# Patient Record
Sex: Female | Born: 1935
Health system: Southern US, Community
[De-identification: ages and names within clinical notes are randomized; demographics above are authoritative.]

## PROBLEM LIST (undated history)

## (undated) DIAGNOSIS — K219 Gastro-esophageal reflux disease without esophagitis: Secondary | ICD-10-CM

## (undated) DIAGNOSIS — C801 Malignant (primary) neoplasm, unspecified: Secondary | ICD-10-CM

## (undated) DIAGNOSIS — M1990 Unspecified osteoarthritis, unspecified site: Secondary | ICD-10-CM

## (undated) DIAGNOSIS — I639 Cerebral infarction, unspecified: Secondary | ICD-10-CM

## (undated) DIAGNOSIS — R0600 Dyspnea, unspecified: Secondary | ICD-10-CM

## (undated) DIAGNOSIS — I10 Essential (primary) hypertension: Secondary | ICD-10-CM

## (undated) HISTORY — PX: SHOULDER SURGERY: SHX246

---

## 2001-10-20 ENCOUNTER — Encounter: Payer: Self-pay | Admitting: Rheumatology

## 2001-10-20 ENCOUNTER — Ambulatory Visit (HOSPITAL_COMMUNITY): Admission: RE | Admit: 2001-10-20 | Discharge: 2001-10-20 | Payer: Self-pay | Admitting: Rheumatology

## 2004-06-21 ENCOUNTER — Encounter: Admission: RE | Admit: 2004-06-21 | Discharge: 2004-06-21 | Payer: Self-pay | Admitting: Orthopedic Surgery

## 2004-06-23 ENCOUNTER — Ambulatory Visit (HOSPITAL_BASED_OUTPATIENT_CLINIC_OR_DEPARTMENT_OTHER): Admission: RE | Admit: 2004-06-23 | Discharge: 2004-06-23 | Payer: Self-pay | Admitting: Orthopedic Surgery

## 2004-06-23 ENCOUNTER — Ambulatory Visit (HOSPITAL_COMMUNITY): Admission: RE | Admit: 2004-06-23 | Discharge: 2004-06-23 | Payer: Self-pay | Admitting: Orthopedic Surgery

## 2005-06-01 ENCOUNTER — Ambulatory Visit: Payer: Self-pay | Admitting: Internal Medicine

## 2005-06-14 ENCOUNTER — Ambulatory Visit: Payer: Self-pay | Admitting: Internal Medicine

## 2005-12-23 ENCOUNTER — Ambulatory Visit (HOSPITAL_COMMUNITY): Admission: RE | Admit: 2005-12-23 | Discharge: 2005-12-23 | Payer: Self-pay | Admitting: Internal Medicine

## 2006-08-04 ENCOUNTER — Encounter: Payer: Self-pay | Admitting: Internal Medicine

## 2006-08-10 ENCOUNTER — Ambulatory Visit: Payer: Self-pay | Admitting: Pulmonary Disease

## 2006-09-07 ENCOUNTER — Ambulatory Visit: Payer: Self-pay | Admitting: Pulmonary Disease

## 2006-10-27 ENCOUNTER — Ambulatory Visit: Payer: Self-pay | Admitting: Pulmonary Disease

## 2006-11-30 DIAGNOSIS — R05 Cough: Secondary | ICD-10-CM | POA: Insufficient documentation

## 2006-11-30 DIAGNOSIS — K219 Gastro-esophageal reflux disease without esophagitis: Secondary | ICD-10-CM

## 2007-01-12 ENCOUNTER — Ambulatory Visit: Payer: Self-pay | Admitting: Pulmonary Disease

## 2007-01-15 ENCOUNTER — Telehealth (INDEPENDENT_AMBULATORY_CARE_PROVIDER_SITE_OTHER): Payer: Self-pay | Admitting: *Deleted

## 2007-01-20 ENCOUNTER — Emergency Department (HOSPITAL_COMMUNITY): Admission: EM | Admit: 2007-01-20 | Discharge: 2007-01-20 | Payer: Self-pay | Admitting: Emergency Medicine

## 2007-01-29 ENCOUNTER — Ambulatory Visit: Payer: Self-pay | Admitting: Pulmonary Disease

## 2007-01-31 ENCOUNTER — Telehealth (INDEPENDENT_AMBULATORY_CARE_PROVIDER_SITE_OTHER): Payer: Self-pay | Admitting: *Deleted

## 2007-02-12 ENCOUNTER — Ambulatory Visit: Payer: Self-pay | Admitting: Pulmonary Disease

## 2007-02-12 DIAGNOSIS — J8410 Pulmonary fibrosis, unspecified: Secondary | ICD-10-CM | POA: Insufficient documentation

## 2007-02-21 ENCOUNTER — Ambulatory Visit: Payer: Self-pay | Admitting: Cardiovascular Disease

## 2007-02-21 ENCOUNTER — Encounter: Payer: Self-pay | Admitting: Pulmonary Disease

## 2007-02-23 ENCOUNTER — Encounter: Payer: Self-pay | Admitting: Pulmonary Disease

## 2007-03-09 ENCOUNTER — Telehealth (INDEPENDENT_AMBULATORY_CARE_PROVIDER_SITE_OTHER): Payer: Self-pay | Admitting: *Deleted

## 2007-07-02 ENCOUNTER — Telehealth (INDEPENDENT_AMBULATORY_CARE_PROVIDER_SITE_OTHER): Payer: Self-pay | Admitting: *Deleted

## 2007-07-27 ENCOUNTER — Telehealth (INDEPENDENT_AMBULATORY_CARE_PROVIDER_SITE_OTHER): Payer: Self-pay | Admitting: *Deleted

## 2007-08-10 ENCOUNTER — Telehealth (INDEPENDENT_AMBULATORY_CARE_PROVIDER_SITE_OTHER): Payer: Self-pay | Admitting: *Deleted

## 2007-08-19 ENCOUNTER — Inpatient Hospital Stay (HOSPITAL_COMMUNITY): Admission: EM | Admit: 2007-08-19 | Discharge: 2007-08-21 | Payer: Self-pay | Admitting: Emergency Medicine

## 2007-08-20 ENCOUNTER — Encounter (HOSPITAL_BASED_OUTPATIENT_CLINIC_OR_DEPARTMENT_OTHER): Payer: Self-pay | Admitting: Internal Medicine

## 2007-08-20 ENCOUNTER — Ambulatory Visit: Payer: Self-pay | Admitting: Vascular Surgery

## 2007-08-23 ENCOUNTER — Telehealth: Payer: Self-pay | Admitting: Pulmonary Disease

## 2007-08-27 ENCOUNTER — Emergency Department (HOSPITAL_COMMUNITY): Admission: EM | Admit: 2007-08-27 | Discharge: 2007-08-28 | Payer: Self-pay | Admitting: Emergency Medicine

## 2007-12-12 ENCOUNTER — Telehealth (INDEPENDENT_AMBULATORY_CARE_PROVIDER_SITE_OTHER): Payer: Self-pay | Admitting: *Deleted

## 2007-12-13 ENCOUNTER — Telehealth (INDEPENDENT_AMBULATORY_CARE_PROVIDER_SITE_OTHER): Payer: Self-pay | Admitting: *Deleted

## 2010-04-03 ENCOUNTER — Emergency Department (HOSPITAL_COMMUNITY): Payer: Medicare Other

## 2010-04-03 ENCOUNTER — Emergency Department (HOSPITAL_COMMUNITY)
Admission: EM | Admit: 2010-04-03 | Discharge: 2010-04-04 | Disposition: A | Discharge status: Home / Self Care | Payer: Medicare Other | Attending: Nurse Practitioner | Admitting: Nurse Practitioner

## 2010-04-03 DIAGNOSIS — E876 Hypokalemia: Secondary | ICD-10-CM | POA: Insufficient documentation

## 2010-04-03 DIAGNOSIS — M129 Arthropathy, unspecified: Secondary | ICD-10-CM | POA: Insufficient documentation

## 2010-04-03 DIAGNOSIS — I10 Essential (primary) hypertension: Secondary | ICD-10-CM | POA: Insufficient documentation

## 2010-04-03 DIAGNOSIS — R630 Anorexia: Secondary | ICD-10-CM | POA: Insufficient documentation

## 2010-04-03 DIAGNOSIS — R5383 Other fatigue: Secondary | ICD-10-CM | POA: Insufficient documentation

## 2010-04-03 DIAGNOSIS — Z79899 Other long term (current) drug therapy: Secondary | ICD-10-CM | POA: Insufficient documentation

## 2010-04-03 DIAGNOSIS — R5381 Other malaise: Secondary | ICD-10-CM | POA: Insufficient documentation

## 2010-04-03 LAB — CK TOTAL AND CKMB (NOT AT ARMC)
CK, MB: 2.3 ng/mL (ref 0.3–4.0)
Total CK: 106 U/L (ref 7–177)

## 2010-04-03 LAB — BASIC METABOLIC PANEL
Calcium: 10 mg/dL (ref 8.4–10.5)
Chloride: 104 mEq/L (ref 96–112)
Creatinine, Ser: 0.93 mg/dL (ref 0.4–1.2)
GFR calc Af Amer: >60 mL/min (ref >60)

## 2010-04-03 LAB — CBC
HCT: 39.5 % (ref 36.0–46.0)
Hemoglobin: 13.6 g/dL (ref 12.0–15.0)
MCH: 28.6 pg (ref 26.0–34.0)
RBC: 4.75 MIL/uL (ref 3.87–5.11)

## 2010-04-03 LAB — POCT CARDIAC MARKERS: Troponin i, poc: <0.05 ng/mL (ref 0.00–0.09)

## 2010-04-03 LAB — DIFFERENTIAL
Basophils Relative: 0 % (ref 0–1)
Lymphocytes Relative: 24 % (ref 12–46)
Lymphs Abs: 2.4 10*3/uL (ref 0.7–4.0)
Monocytes Relative: 9 % (ref 3–12)
Neutro Abs: 6.3 10*3/uL (ref 1.7–7.7)
Neutrophils Relative %: 63 % (ref 43–77)

## 2010-04-03 LAB — URINALYSIS, ROUTINE W REFLEX MICROSCOPIC
Hgb urine dipstick: NEGATIVE
Specific Gravity, Urine: 1.02 (ref 1.005–1.030)
Urine Glucose, Fasting: NEGATIVE mg/dL

## 2010-04-03 LAB — GLUCOSE, CAPILLARY: Glucose-Capillary: 93 mg/dL (ref 70–99)

## 2010-06-29 NOTE — Discharge Summary (Signed)
NAMEMECHILLE, Michelle [name] NO.:  [id number]   MEDICAL RECORD NO.:  [id number]          PATIENT TYPE:  INP   LOCATION:  4703                         FACILITY:  [facility]   PHYSICIAN:  Barry Dienes. Eloise Harman, M.D.DATE OF BIRTH:  1930/11/24   DATE OF ADMISSION:  08/19/2007  DATE OF DISCHARGE:  08/21/2007                               DISCHARGE SUMMARY   PERTINENT FINDINGS:  The patient is a 77 year old African American woman  who is well known to me.  She has had a chronic cough for several years  despite various treatment and evaluations by several doctors (7 doctors  by her counts).  Over the past few days, she has had worsening of her  chronic cough that has been mildly productive.  She has not had fever or  chills, but complains of mild rhinitis, and moderate dyspnea on exertion  (New York Heart Association class III).  She also has mild left-sided  pleuritic chest pain.  Her [facility]ist has evaluated  her and felt that her cough was most likely a combination of upper  airway cough syndrome (postnasal drip) and gastroesophageal reflux.  Unfortunately, medications to combat these etiologies have been  ineffective with her.  He had referred her to a [employer], who she is to see on October 18, 2007.  Of note,  past CT scans of the chest and CT scans of the sinuses have reportedly  been unremarkable.   PAST MEDICAL HISTORY:  1. Chronic cough.  2. Hypertension.  3. Osteoarthritis.  4. Osteoporosis.  5. Atypical chest pain with 2005, adenosine Cardiolite test, normal.  6. Palpitations without syncope.  7. Fibromyalgia.   MEDICATIONS PRIOR TO ADMISSION:  1. Actonel 35 mg p.o. once weekly.  2. Sudafed p.r.n.  3. Calcium tablets twice daily.  4. Nexium 40 mg daily.  5. Maxzide 25 mg once daily.   ALLERGIES:  No known drug allergies.   INITIAL PHYSICAL EXAMINATION:  VITAL SIGNS:  Blood pressure 139/74,  pulse 101, respirations  20, temperature 98.4, and pulse oxygen  saturation 98% on room air.  GENERAL:  She is an elderly black female who is in no apparent distress.  HEAD, EYES, EARS, NOSE AND THROAT:  Within normal limits.  NECK:  Supple without jugular venous distention or carotid bruit.  CHEST:  Very minimal bibasilar crackles.  HEART:  Regular rate and rhythm without significant murmur or gallop.  ABDOMEN:  Benign.  EXTREMITIES:  Without edema and the pedal pulses were normal.  NEUROLOGICAL:  Nonfocal.   INITIAL LABORATORY STUDIES:  EKG showed the following:  1. Normal sinus rhythm.  2. Low precordial R-wave amplitude suggestive of anterior wall      myocardial infarction, age undetermined.  A urinalysis had a      specific gravity of 1.025, which was nitrite positive with white      blood cells 21-50 and many bacteria.  A urine culture has been      negative to date.  White blood cell count was 11.0, hemoglobin 12,      hematocrit 38, and  platelets 323, with 90% neutrophils and 0%      eosinophils.  Serum sodium was 133, potassium 3.3, chloride 99,      carbon dioxide 22, BUN 14, creatinine 1.16, glucose 310, and serum      albumin 4.0.   A chest x-ray showed the following:  1. Acute on chronic bronchitis (consistent with a viral infection).  2. Minimal bibasilar atelectasis.  3. No focal consolidation.  Abdomen x-rays were normal.   HOSPITAL COURSE:  The patient was admitted to a medical bed in the  telemetry.  She had serial cardiac isoenzymes that were normal.  She had  a D-dimer test that read out as 0.51 (slightly high).  Subsequent CT  angiogram of the chest showed no evidence of pulmonary embolism with  increased peribronchial thickening consistent with bronchitis and  minimal bilateral lower lobe bronchiectasis.  She had a bilateral DVT  ultrasound exam which showed no evidence of DVT or superficial  thrombolysis.  She was treated with prednisone and Xopenex nebulizers.   PROCEDURES:  CT  angiogram of the chest and bilateral lower extremity  deep venous thrombosis ultrasound exam.   COMPLICATIONS:  None.   CONDITION ON DISCHARGE:  She still has a dry cough, but no shortness of  breath at rest or with walking in the room.  She no longer has  significant pleuritic chest pain.   MOST RECENT PHYSICAL EXAMINATION:  VITAL SIGNS:  Blood pressure 144/88,  pulse 88, respirations 22, temp 97.9, and pulse oxygen saturation 94% on  room air.  Most recent capillary blood glucose levels have been 107,  131, and 195.  GENERAL:  She is an elderly black female who had a dry cough, but no  shortness of breath.  CHEST:  Clear to auscultation.  HEART:  Regular rate and rhythm.  ABDOMEN:  Benign.  EXTREMITIES:  Without edema.   MOST RECENT LABORATORY STUDIES:  White blood cell count 9.7, hemoglobin  11.4, hematocrit 34.7, and platelets 288.  Serum sodium 133, potassium  3.3, chloride 99, carbon dioxide 22, BUN 14, creatinine 1.16, and  glucose 310.   DISCHARGE DIAGNOSES:  1. Chronic cough.  2. Minimal bibasilar cylindrical bronchiectasis and bronchitis.  3. Osteoporosis.  4. Gastroesophageal reflux disease.  5. Chronic rhinitis.  6. Hypertension.  7. Hypokalemia.  8. Anemia.  9. Pleuritic chest pain.  10.Fibromyalgia.   DISCHARGE MEDICATIONS:  1. Actonel 35 mg p.o. once weekly.  2. Caltrate D 600 two tablets p.o. daily.  3. Nexium 40 mg p.o. twice daily.  4. Flonase 2 sprays each nostril once daily.  5. Hycodan 1-2 teaspoons p.o. every 4 hours p.r.n. cough, #6 ounces,      refill 1.  6. GlycoLax 17 g daily p.r.n. constipation.  7. Senokot-S 1 tablet daily p.r.n. constipation.  8. Maxzide 25 one tablet p.o. q.a.m.  9. Potassium chloride 20 mEq p.o. once daily.   DISPOSITION AND FOLLOWUP:  The patient will be discharged to home and  was advised to schedule a follow up visit with Dr. Jarome Matin in  approximately 2 weeks at [facility].            ______________________________  Barry Dienes Eloise Harman, M.D.     DGP/MEDQ  D:  08/21/2007  T:  08/21/2007  Job:  [id number]   cc:   Oretha Milch, MD  Kinnie Scales Annalee Genta, M.D.  Pollyann Savoy, M.D.

## 2010-06-29 NOTE — Assessment & Plan Note (Signed)
HEALTHCARE                             PULMONARY OFFICE NOTE   Michelle [name], Michelle [name]                       MRN:          [id number]  DATE:01/12/2007                            DOB:          20-Nov-1930    Michelle [name] is a 77 year old woman with chronic cough that started after  an illness in 09/2005.  PFTs by Dr. Willa Rough showed an FEV-1 of 103% and no  change with bronchodilators.  Upper endoscopy by Dr. Marina Goodell suggested  reflux esophagitis but she has not responded well to either Prilosec or  Nexium.  She seemed to respond somewhat to a therapeutic trial of  Chlorpheniramine and Pseudoephedrine with suggested upper airway cough  syndrome.  Reglan was also added for non-acid reflux.   However, on this visit she states that these medicines seem to help for  awhile but now her coughing is back.  She had a bout of coughing in the  office and clearly seemed to be in quite some distress.  She again  reported sinus drainage, earache and head congestion.  She was coughing  up clear phlegm.   CURRENT MEDICATIONS:  1. Meloxicam 7.5 mg daily.  2. HCTZ/Triamterine 25/37.5 mg 1/2 tablet daily  3. Actonel every Monday.  4. Calcium tablets daily.  5. Fluoxetine 10 mg daily.  6. Allegra-D b.i.d.  7. Robitussin DM as needed.  8. _______ 2 puffs in each naris daily.  9. Nexium 40 mg daily  10.Singulair 10 mg daily  11.Xopenex MDI 2 puffs p.r.n.  P.R.N. medications include:  1. Tessalon 100 mg 4x a day.  2. Tramadol 50 mg b.i.d. p.r.n.   PHYSICAL EXAMINATION:  On exam today, weight 157 down 6 pounds,  temperature 97.7, blood pressure 130/80, heart rate 92, oxygen  saturation 97% on room air.  HEENT:  No postnasal drip, clear mucus with clear discharge from nasal  turbinates.  CARDIAC:  S1, S2 normal.  CHEST:  Faint rhonchi after a bout of coughing.  ABDOMEN:  Soft, nontender.  EXTREMITIES:  No edema.   Chest CT and chest x-ray were reported normal as per the  patient, but I  have been able to track these down.  Records Dr. Annalee Genta.   IMPRESSION:  I  feel that her chronic cough is likely a combination of  upper airway cough syndrome (postnasal drip) and GERD.  Some  bronchospasm was noted on today's exam and I think it is reasonable to  undertake a trial of oral steroids given her significant distress from  this cough.   RECOMMENDATIONS:  1. Accordingly, I have started her on 20 mg of prednisone for about 2      weeks and taper down to 10 mg for another 2 weeks based on      symptomatic  relief.  Advair Diskus 250/50 1 puff b.i.d. will also      be started.  2. I will try to track her imaging studies and she will continue      Allegra-D.  She has taken a full course of first generation  antihistaminic and decongestant combination.  3. Given her acute distress, I have prescribed her Tussionex twice a      day to enable her to rest better.  I have explained to her that      this is not a long-term option however.  She will see our nurse      practitioner for review of her medications and side effects within      a couple of weeks and follow with me in 3-4 weeks' time.     Oretha Milch, MD  Electronically Signed    RVA/MedQ  DD: 01/12/2007  DT: 01/12/2007  Job #: [id number]   cc:   Barry Dienes. Eloise Harman, M.D.  Kinnie Scales. Annalee Genta, M.D.  Roselyn M. Willa Rough, M.D.

## 2010-06-29 NOTE — Assessment & Plan Note (Signed)
Michelle [name]                             PULMONARY OFFICE NOTE   Michelle [name], Michelle [name]                       MRN:          [id number]  DATE:08/10/2006                            DOB:          [birth year]    REFERRING PHYSICIAN:  Barry Dienes. Eloise Harman, M.D.   REASON FOR CONSULTATION:  Chronic cough.   HISTORY OF PRESENT ILLNESS:  Michelle [name] is a pleasant 77 year old  African-American woman who has suffered from a chronic cough for the  past 8 to 9 months.  She describes mostly dry and nonproductive cough,  occasionally scant white sputum.  She describes postnasal drainage.  Sometimes the cough keeps her up at night.  She describes episodes of  cough incontinence.  She has been seen by Dr. Annalee Genta, ENT, and has  had CT of her sinuses and her chest performed which were reportedly  normal.  She has tried multiple medications for her cough including  inhalers, Allegra and cough syrups such as Robitussin D, Tessalon  Perles, nasal corticosteroids without much benefit.  She denies  nocturnal wheezing or childhood history of asthma.  She reports  occasional bitterness in her mouth but denies symptoms of clear reflux.  Upper endoscopy by Dr. Marina Goodell suggested reflux esophagitis.   PAST MEDICAL HISTORY:  Hypertension, osteoarthritis.   PAST SURGICAL HISTORY:  Hand surgery in 2006.   ALLERGIES:  None.   CURRENT MEDICATIONS:  1. Meloxicam 7.5 mg daily.  2. Triamterine/HCTZ 37.5/25 mg daily  3. Actonel every Monday.  4. Calcium daily.  5. Fluoxetine 10 mg daily.  6. Allegra D b.i.d.  7. Prilosec 20 mg p.o. daily  8. Veramyst nasal inhaler as needed  9. Tessalon Perles 100 mg b.i.d.  10.Saline mist nasal.   SOCIAL HISTORY:  Lifetime never smoker.  She is widowed, lives by  herself.   FAMILY HISTORY:  She was adopted.   REVIEW OF SYSTEMS:  Reports occasional cough incontinence, dyspnea on  exertion with activity.   PHYSICAL EXAMINATION:  Weight 152  pounds, blood pressure 150/102, heart  rate 82, oxygen saturation 95% on room air.  HEENT:  No postnasal drip.  Nasal rhinitis.  NECK:  Supple, no lymphadenopathy.  CARDIOVASCULAR:  S1, S2 normal.  CHEST:  Clear to auscultation.  ABDOMEN:  Soft, nontender.  EXTREMITIES:  No edema.   IMPRESSION AND PLAN:  Chronic cough in a 77 year old, never smoker, most  likely related to postnasal drip (upper airway cough syndrome), or GERD.  Cough variant asthma is less likely to start at this age.  She does not  seem to be on any ACE inhibitors.  Eosinophilic  bronchitis seems less  likely.  I will review her imaging studies.   Rather than further diagnostic testing, I would suggest a therapeutic  trial of a first-generation antihistamine/decongestant combination such  as Deconamine 1 tablet b.i.d. for 4 weeks.  I have discussed possible  side effects including daytime sedation and perhaps a slight increase in  her blood pressure.  She is clearly in much distress from her chronic  cough and needs immediate relief.  If this does not work, we may have to prescribe narcotic antitussives  for symptomatic  relief and intensify her GERD therapy, and perhaps  pursue pH monitoring, or esophageal monometry.  Again, I doubt that this  is cough-variant asthma and hence will not pursue a methacholine  challenge at the present time.  She will return for a followup in 4  weeks' time.     Oretha Milch, MD  Electronically Signed    RVA/MedQ  DD: 08/10/2006  DT: 08/10/2006  Job #: [id number]

## 2010-06-29 NOTE — Assessment & Plan Note (Signed)
[facility]                             PULMONARY OFFICE NOTE   Michelle [name], Michelle [name]                       MRN:          [id number]  DATE:10/27/2006                            DOB:          06/02/1930    Michelle [name] is a 77 year old woman with chronic cough for about a year  now.  Imaging studies of her sinuses and chest have been negative.  Upper endoscopy by Dr. Marina Goodell suggested reflux esophagitis, but she did  not respond as well to Prilosec.  She does not have a childhood history  of asthma, and did not seem to be on an angiotensin-converting enzyme  inhibitor.  I had her undertake a therapeutic trial with 1st generation  antihistamine decongestant combination (chlorpheniramine plus  pseudoephedrine), and she felt much improved with this.  I gave her  Jerilynn Som for breakthrough cough.  She took this for about 8  weeks, and as soon as she stopped this her symptoms of nasal drainage  and coughing have returned.  She does not report significant heartburn.  She asked me if she can take the same medication indefinitely.   PHYSICAL EXAMINATION:  Weight 156 pounds, blood pressure 148/98, heart  rate 79, oxygen saturation 98% on room air.  CHEST:  Clear to auscultation.  Congested nasal mucosa with some drainage, no postnasal drip.  CARDIOVASCULAR:  S1, S2 normal.   IMPRESSION:  Her excellent response to antihistamines and decongestants  suggests upper airway cough syndrome (postnasal drip) as the cause of  her chronic cough.  I did not feel that this is cough variant asthma,  hence I did not pursue a methacholine challenge.  She seems to be on  antireflux therapy with Prilosec already.  Eosinophilic bronchitis would  be less likely.   RECOMMENDATIONS:  1. Chlorpheniramine 8 mg and pseudoephedrine 120 mg 1 tablet b.i.d.      will be continued for another 8 weeks.  I have discussed the side      effects of hypertension and insomnia, and excessive  somnolence with      this medication.  At this time, she desires cough release so much      that these side effects do not seem to matter to her.  Clearly,      Sudafed is not a long-term option here.  2. I have added Reglan for nonacid reflux.  She will return for a      followup in 3 months' time.     Oretha Milch, MD  Electronically Signed    RVA/MedQ  DD: 10/27/2006  DT: 10/28/2006  Job #: [id number]   cc:   Barry Dienes. Eloise Harman, M.D.

## 2010-06-29 NOTE — H&P (Signed)
NAMETWINKLE, SOCKWELL NO.:  [id number]   MEDICAL RECORD NO.:  [id number]          PATIENT TYPE:  INP   LOCATION:  4703                         FACILITY:  [facility]   PHYSICIAN:  Barry Dienes. Eloise Harman, M.D.DATE OF BIRTH:  [birth year]   DATE OF ADMISSION:  08/19/2007  DATE OF DISCHARGE:                              HISTORY & PHYSICAL   CHIEF COMPLAINT:  Cough, shortness of breath, and chest pain.   HISTORY OF PRESENT ILLNESS:  The patient is a 77 year old African  American woman who is well-known to me.  She has had a chronic cough for  several years despite various treatments and evaluations by several  doctors (7 doctors by her count).  For the past few days, she has had  worsening of her chronic cough that has been mildly productive.  She has  not had fever, chills, but complains of some rhinitis and moderate  dyspnea on exertion (New York Heart Association class III).  She has  mild left-sided pleuritic chest pain.  Her [facility]  has evaluated her and felt that he could not control her cough and had  referred her to a [facility] who she is to see on  October 18, 2007.  Of note, she has had past CT scans of the chest and  CT scans of the sinuses they were unremarkable.   PAST MEDICAL HISTORY:  1. Chronic cough.  2. Hypertension.  3. Osteoarthritis.  4. Osteoporosis.  5. Atypical chest pain with a 2005 adenosine Cardiolite test normal.  6. History of palpitations with no syncope and fibromyalgia.   MEDICATIONS PRIOR TO ADMISSION:  1. Actonel 35 mg once weekly.  2. Sudafed as needed.  3. Calcium tablets twice daily.  4. Nexium 40 mg daily.  5. Maxzide 25 once daily.   ALLERGIES:  No known drug allergies.   PAST SURGICAL HISTORY:  Total abdominal hysterectomy and bilateral  salpingo-oophorectomy.   FAMILY HISTORY:  Noncontributory, as she was adopted.   SOCIAL HISTORY:  She has been a widow since 74.  She has 2 sons and  1  daughter.  One son died at age 45 years of chronic kidney disease.  He  also had hypertension and hepatitis C.  There is no history of tobacco  or alcohol abuse.   REVIEW OF SYSTEMS:  Notable for chronic cough for several years and  recent shortness of breath and dyspnea on exertion.  She also has  arthritis and muscle pain all over.  She denies recent fever, chills,  nausea, vomiting, constipation, diarrhea, dysuria, frequency, anxiety,  or depression.   PHYSICAL EXAMINATION:  VITAL SIGNS:  Blood pressure 139/74, pulse 101,  respirations 20, temperature 98.4, and pulse oxygen saturation 98% on  room air.  GENERAL:  She is an elderly black female who is in no apparent distress.  HEAD, EYES, EARS, NOSE, AND THROAT:  Within normal limits.  NECK:  Supple without jugular venous distention or carotid bruit.  CHEST:  Notable for minimal bibasilar crackles.  HEART:  Regular rate and rhythm without significant murmur or gallop.  ABDOMEN:  Normal bowel sounds.  No hepatosplenomegaly or tenderness.  EXTREMITIES:  Without cyanosis, clubbing, or edema.  The pedal pulses  were normal.  NEUROLOGIC:  Nonfocal.   LABORATORY STUDIES:  EKG showed the following:  1. Normal sinus rhythm.  2. Low precordial R-wave amplitude suggestive of anterior wall      myocardial infarction, age undetermined.   Urinalysis had specific gravity of 1.025, which has nitrite positive  with white blood cells 21-50 and bacteria many.  White blood cell count  was 11.0, hemoglobin 12, hematocrit 38, platelets 323, and was 90%  neutrophils with 0% eosinophils.  Serum sodium 133, potassium 3.3,  chloride 99, carbon dioxide 22, BUN 14, creatinine 1.16, glucose 310,  and serum albumin 4.0.   A chest x-ray showed the following:  1. Acute on chronic bronchitis (consistent with a viral infection).  2. Minimal bibasilar atelectasis.  3. No focal consolidation.   Abdomen x-rays were normal.   IMPRESSION AND PLAN:  1.  Cough:  Most likely secondary to acute on chronic bronchitis.  I      doubt that she has early pneumonia or congestive heart failure.      The cause of her cough is uncertain.  She has had empiric treatment      for acid reflux and postnasal drip.  None of these medicines have      helped her.  She has also had an evaluation for asthma, which      apparently was unremarkable.  She will be admitted for control of      her cough and evaluation of her chest pain.  Vicodin will be      scheduled for her cough suppression and we will continue Xopenex      nebulizer treatments.  2. Chest pain:  This is somewhat pleuritic and likely secondary to      prolonged coughing.  She has no hypoxia to suggest pulmonary      embolism.  A cardiac source of her chest pain seems unlikely.  We      will check a D-dimer test and serial cardiac isoenzymes.  3. Hyperglycemia:  Possibly a lab artifact, as she has no history of      hyperglycemia or diabetes mellitus.  We will check fasting      capillary blood glucose levels before meals t.i.d. and administer      NovoLog sliding-scale insulin      as needed.  4. Hypokalemia:  Mild and likely due to chronic diuretic treatment for      hypertension.  Potassium chloride and oral supplementation will be      added to her regimen.           ______________________________  Barry Dienes. Eloise Harman, M.D.     DGP/MEDQ  D:  08/19/2007  T:  08/20/2007  Job:  [id number]   cc:   Oretha Milch, MD  Kinnie Scales Annalee Genta, M.D.  Pollyann Savoy, M.D.

## 2010-07-02 NOTE — Op Note (Signed)
Michelle [name], Michelle [name]                ACCOUNT NO.:  [id number]   MEDICAL RECORD NO.:  [id number]          PATIENT TYPE:  AMB   LOCATION:  DSC                          FACILITY:  [facility]   PHYSICIAN:  Artist Pais. Weingold, M.D.DATE OF BIRTH:  May 08, 1930   DATE OF PROCEDURE:  06/23/2004  DATE OF DISCHARGE:                                 OPERATIVE REPORT   PREOPERATIVE DIAGNOSIS:  Right wrist triangular fibrocartilaginous complex  tear.   POSTOPERATIVE DIAGNOSIS:  Right wrist triangular fibrocartilaginous complex  tear.   PROCEDURE:  Right wrist arthroscopy with debridement of triangular  fibrocartilaginous complex tear.   SURGEON:  Artist Pais. Mina Marble, M.D.   ASSISTANTSharia Reeve Knotts.   ASSISTANT:  General.   TOURNIQUET TIME:  35 minutes.   COMPLICATIONS:  None.   DRAINS:  None.   OPERATIVE REPORT:  The patient was taken to the operating room. After the  induction of adequate general anesthesia, the right upper extremity was  prepped and draped in the usual sterile fashion. Esmarch was used to  exsanguinate the limb. Tourniquet was inflated to 250 mmHg. At this point in  time, the patient's right upper extremity was padded and placed in a wrist  traction tower with 12 pounds of countertraction across the radiocarpal  joint. Standard 3-4 arthroscopic portal was established. Visualization  revealed intact radial styloid ligament, intact __________ ligament,  partially torn LT ligament, and a TFCC central degenerative tear. A 6U  outflow portal was established with an 18-gauge needle and a 4-5 working  portal under direct vision. The TFCC tear was debrided down to a stable rim  using a combination of 2.9 suction shaver and a 2.3-mm ArthroCare wand. An  ulnar side synovectomy was performed as well as debridement of the LT  ligament. The portals were then closed after thorough irrigation with 5-0  nylon and joint was injected with Marcaine for postoperative pain control.  The patient  was then placed in a sterile dressing with Xeroform, 4x4s, and a  volar splint. The patient tolerated the procedure well and went to recovery  room in stable fashion.      MAW/MEDQ  D:  06/23/2004  T:  06/23/2004  Job:  [id number]

## 2010-11-11 LAB — DIFFERENTIAL
Basophils Relative: 1
Eosinophils Absolute: 0
Eosinophils Relative: 0
Lymphocytes Relative: 6 — ABNORMAL LOW
Lymphs Abs: 0.6 — ABNORMAL LOW
Lymphs Abs: 1.9
Monocytes Absolute: 0.2
Monocytes Absolute: 0.9
Monocytes Relative: 9
Neutro Abs: 6

## 2010-11-11 LAB — COMPREHENSIVE METABOLIC PANEL
ALT: 14
AST: 33
Albumin: 4
CO2: 22
Calcium: 8.9
Chloride: 99
Creatinine, Ser: 1.16
GFR calc Af Amer: 55 — ABNORMAL LOW
Sodium: 133 — ABNORMAL LOW
Total Bilirubin: 1.1

## 2010-11-11 LAB — CBC
HCT: 34.7 — ABNORMAL LOW
Hemoglobin: 11.4 — ABNORMAL LOW
Hemoglobin: 11.8 — ABNORMAL LOW
MCHC: 32.7
MCHC: 32.9
MCV: 83.4
MCV: 83.6
MCV: 84
Platelets: 323
RBC: 4.13
RBC: 4.32
RBC: 4.59
WBC: 11 — ABNORMAL HIGH
WBC: 9.7

## 2010-11-11 LAB — URINALYSIS, ROUTINE W REFLEX MICROSCOPIC
Glucose, UA: NEGATIVE
Specific Gravity, Urine: 1.025
pH: 6

## 2010-11-11 LAB — D-DIMER, QUANTITATIVE: D-Dimer, Quant: 0.51 — ABNORMAL HIGH

## 2010-11-11 LAB — URINE CULTURE: Colony Count: >=100000

## 2010-11-11 LAB — POCT I-STAT, CHEM 8
BUN: 12
Calcium, Ion: 1.12
Chloride: 103
Creatinine, Ser: 1
Glucose, Bld: 99
HCT: 38
Potassium: 4.2

## 2010-11-11 LAB — URINE MICROSCOPIC-ADD ON

## 2010-11-11 LAB — TROPONIN I: Troponin I: <0.01

## 2010-11-11 LAB — CK TOTAL AND CKMB (NOT AT ARMC)
CK, MB: 2
Relative Index: INVALID

## 2010-11-11 LAB — POCT CARDIAC MARKERS: Troponin i, poc: <0.05

## 2010-11-22 LAB — COMPREHENSIVE METABOLIC PANEL
AST: 25
Albumin: 3.9
Alkaline Phosphatase: 72
Chloride: 98
GFR calc Af Amer: >60
Potassium: 3.8
Sodium: 132 — ABNORMAL LOW
Total Bilirubin: 0.9

## 2010-11-22 LAB — DIFFERENTIAL
Basophils Absolute: 0
Basophils Relative: 0
Eosinophils Relative: 2
Lymphocytes Relative: 18
Monocytes Absolute: 0.8

## 2010-11-22 LAB — CBC
HCT: 42.8
Hemoglobin: 14
Platelets: 322
RBC: 5.22 — ABNORMAL HIGH

## 2010-11-22 LAB — POCT CARDIAC MARKERS: Troponin i, poc: <0.05

## 2011-05-15 ENCOUNTER — Emergency Department (HOSPITAL_COMMUNITY)
Admission: EM | Admit: 2011-05-15 | Discharge: 2011-05-15 | Disposition: A | Discharge status: Home / Self Care | Payer: Medicare Other | Attending: Emergency Medicine | Admitting: Emergency Medicine

## 2011-05-15 ENCOUNTER — Encounter (HOSPITAL_COMMUNITY): Payer: Self-pay

## 2011-05-15 DIAGNOSIS — I10 Essential (primary) hypertension: Secondary | ICD-10-CM | POA: Insufficient documentation

## 2011-05-15 DIAGNOSIS — Z8739 Personal history of other diseases of the musculoskeletal system and connective tissue: Secondary | ICD-10-CM | POA: Insufficient documentation

## 2011-05-15 DIAGNOSIS — E860 Dehydration: Secondary | ICD-10-CM | POA: Insufficient documentation

## 2011-05-15 DIAGNOSIS — Z79899 Other long term (current) drug therapy: Secondary | ICD-10-CM | POA: Insufficient documentation

## 2011-05-15 DIAGNOSIS — R5381 Other malaise: Secondary | ICD-10-CM | POA: Insufficient documentation

## 2011-05-15 DIAGNOSIS — R5383 Other fatigue: Secondary | ICD-10-CM | POA: Insufficient documentation

## 2011-05-15 DIAGNOSIS — R110 Nausea: Secondary | ICD-10-CM | POA: Insufficient documentation

## 2011-05-15 HISTORY — DX: Essential (primary) hypertension: I10

## 2011-05-15 HISTORY — DX: Unspecified osteoarthritis, unspecified site: M19.90

## 2011-05-15 LAB — DIFFERENTIAL
Eosinophils Relative: 0 % (ref 0–5)
Lymphocytes Relative: 46 % (ref 12–46)
Lymphs Abs: 1.6 10*3/uL (ref 0.7–4.0)
Monocytes Absolute: 0.5 10*3/uL (ref 0.1–1.0)
Monocytes Relative: 13 % — ABNORMAL HIGH (ref 3–12)

## 2011-05-15 LAB — CBC
HCT: 42.6 % (ref 36.0–46.0)
Hemoglobin: 14.1 g/dL (ref 12.0–15.0)
MCV: 83 fL (ref 78.0–100.0)
RDW: 13.4 % (ref 11.5–15.5)
WBC: 3.5 10*3/uL — ABNORMAL LOW (ref 4.0–10.5)

## 2011-05-15 LAB — BASIC METABOLIC PANEL
CO2: 25 mEq/L (ref 19–32)
Chloride: 96 mEq/L (ref 96–112)
Creatinine, Ser: 1.03 mg/dL (ref 0.50–1.10)

## 2011-05-15 MED ORDER — SODIUM CHLORIDE 0.9 % IV BOLUS (SEPSIS)
1000.0000 mL | Freq: Once | INTRAVENOUS | Status: AC
  Administered 2011-05-15: 1000 mL via INTRAVENOUS

## 2011-05-15 NOTE — Discharge Instructions (Signed)
Dehydration, Adult Dehydration means your body does not have as much fluid as it needs. Your kidneys, brain, and heart will not work properly without the right amount of fluids and salt.  HOME CARE  Ask your doctor how to replace body fluid losses (rehydrate).   Drink enough fluids to keep your pee (urine) clear or pale yellow.   Drink small amounts of fluids often if you feel sick to your stomach (nauseous) or throw up (vomit).   Eat like you normally do.   Avoid:   Foods or drinks high in sugar.   Bubbly (carbonated) drinks.   Juice.   Very hot or cold fluids.   Drinks with caffeine.   Fatty, greasy foods.   Alcohol.   Tobacco.   Eating too much.   Gelatin desserts.   Wash your hands to avoid spreading germs (bacteria, viruses).   Only take medicine as told by your doctor.   Keep all doctor visits as told.  GET HELP RIGHT AWAY IF:   You cannot drink something without throwing up.   You get worse even with treatment.   Your vomit has blood in it or looks greenish.   Your poop (stool) has blood in it or looks black and tarry.   You have not peed in 6 to 8 hours.   You pee a small amount of very dark pee.   You have a fever.   You pass out (faint).   You have belly (abdominal) pain that gets worse or stays in one spot (localizes).   You have a rash, stiff neck, or bad headache.   You get easily annoyed, sleepy, or are hard to wake up.   You feel weak, dizzy, or very thirsty.  MAKE SURE YOU:   Understand these instructions.   Will watch your condition.   Will get help right away if you are not doing well or get worse.  Document Released: 11/27/2008 Document Revised: 01/20/2011 Document Reviewed: 09/20/2010 [facility] Patient Information 2012 [place], [place].   Avoiding milk or foods containing milk such as ice cream while having diarrhea. Make sure they use and drink lots of fluids to avoid dehydration. You can take Imodium AD for diarrhea as  directed. Return or see Dr.Paterson if your condition worsens for any reason

## 2011-05-15 NOTE — ED Notes (Signed)
DR. Shela Commons in room re-evaluating patient

## 2011-05-15 NOTE — ED Notes (Signed)
Pt c/o generalize weakness, ausea, diarrhea, not able to eat, no vomiting, no abdominal pain. Denied sob, chest pain.

## 2011-05-15 NOTE — ED Provider Notes (Addendum)
History     CSN: [id number]  Arrival date & time 05/15/11  1414   First MD Initiated Contact with Patient 05/15/11 1542      Chief Complaint  Patient presents with  . Weakness  . Nausea    (Consider location/radiation/quality/duration/timing/severity/associated sxs/prior treatment) HPI Complains of diarrhea and generalized weakness onset 5 days ago. Denies nausea patient is presently hungry no treatment prior to coming here.. patient has had no diarrhea today weakness persists. No abdominal pain no fever no other associated symptoms. No treatment prior to coming here Past Medical History  Diagnosis Date  . Hypertension   . Arthritis     Past Surgical History  Procedure Date  . Shoulder surgery     Right    No family history on file.  History  Substance Use Topics  . Smoking status: Never Smoker   . Smokeless tobacco: Not on file  . Alcohol Use: No    OB History    Grav Para Term Preterm Abortions TAB SAB Ect Mult Living                  Review of Systems  Constitutional: Positive for fatigue.  Gastrointestinal: Positive for diarrhea.  All other systems reviewed and are negative.    Allergies  Review of patient's allergies indicates no known allergies.  Home Medications   Current Outpatient Rx  Name Route Sig Dispense Refill  . CALCIUM 600 + MINERALS 600-200 MG-UNIT PO TABS Oral Take 1 tablet by mouth daily.    Marland Kitchen GABAPENTIN 300 MG PO CAPS Oral Take 300 mg by mouth 3 (three) times daily.    Marland Kitchen LOSARTAN POTASSIUM-HCTZ 100-12.5 MG PO TABS Oral Take 1 tablet by mouth daily.    . MELOXICAM 7.5 MG PO TABS Oral Take 7.5 mg by mouth daily.    . NYQUIL PO Oral Take 5 mLs by mouth 2 (two) times daily as needed. For coughing.    Marland Kitchen VITAMIN B-12 1000 MCG PO TABS Oral Take 1,000 mcg by mouth daily.      BP 138/76  Pulse 77  Temp(Src) 98 F (36.7 C) (Oral)  Resp 17  SpO2 99%  Physical Exam  Nursing note and vitals reviewed. Constitutional: She appears  well-developed and well-nourished. No distress.  HENT:  Head: Normocephalic and atraumatic.       Mucous membranes dry  Eyes: Conjunctivae are normal. Pupils are equal, round, and reactive to light.  Neck: Neck supple. No tracheal deviation present. No thyromegaly present.  Cardiovascular: Normal rate and regular rhythm.   No murmur heard. Pulmonary/Chest: Effort normal and breath sounds normal.  Abdominal: Soft. Bowel sounds are normal. She exhibits no distension. There is no tenderness.  Musculoskeletal: Normal range of motion. She exhibits no edema and no tenderness.  Neurological: She is alert. Coordination normal.  Skin: Skin is warm and dry. No rash noted.  Psychiatric: She has a normal mood and affect. Her behavior is normal. Thought content normal.    ED Course  Procedures (including critical care time)  Labs Reviewed - No data to display No results found. Results for orders placed during the hospital encounter of 05/15/11  BASIC METABOLIC PANEL      Component Value Range   Sodium 136  135 - 145 (mEq/L)   Potassium 4.3  3.5 - 5.1 (mEq/L)   Chloride 96  96 - 112 (mEq/L)   CO2 25  19 - 32 (mEq/L)   Glucose, Bld 102 (*) 70 - 99 (mg/dL)  BUN 24 (*) 6 - 23 (mg/dL)   Creatinine, Ser 1.61  0.50 - 1.10 (mg/dL)   Calcium 9.6  8.4 - 09.6 (mg/dL)   GFR calc non Af Amer 51 (*) >90 (mL/min)   GFR calc Af Amer 59 (*) >90 (mL/min)  CBC      Component Value Range   WBC 3.5 (*) 4.0 - 10.5 (K/uL)   RBC 5.13 (*) 3.87 - 5.11 (MIL/uL)   Hemoglobin 14.1  12.0 - 15.0 (g/dL)   HCT 04.5  40.9 - 81.1 (%)   MCV 83.0  78.0 - 100.0 (fL)   MCH 27.5  26.0 - 34.0 (pg)   MCHC 33.1  30.0 - 36.0 (g/dL)   RDW 91.4  78.2 - 95.6 (%)   Platelets 200  150 - 400 (K/uL)  DIFFERENTIAL      Component Value Range   Neutrophils Relative 40 (*) 43 - 77 (%)   Neutro Abs 1.4 (*) 1.7 - 7.7 (K/uL)   Lymphocytes Relative 46  12 - 46 (%)   Lymphs Abs 1.6  0.7 - 4.0 (K/uL)   Monocytes Relative 13 (*) 3 - 12  (%)   Monocytes Absolute 0.5  0.1 - 1.0 (K/uL)   Eosinophils Relative 0  0 - 5 (%)   Eosinophils Absolute 0.0  0.0 - 0.7 (K/uL)   Basophils Relative 1  0 - 1 (%)   Basophils Absolute 0.0  0.0 - 0.1 (K/uL)   No results found.   No diagnosis found.  Patient ate a full meal in the emergency department feels much improved after treatment with intravenous fluids. At 7:15 PM she is alert appropriate no distress  MDM  Plan encourage by mouth fluids and food Imodium AD as needed for diarrhea Return or followup with Dr. Jarold Motto if condition worsens Diagnosis #1 diarrhea #2 dehydration        Doug Sou, MD 05/15/11 Jerene Bears  Doug Sou, MD 05/15/11 2130

## 2012-04-10 ENCOUNTER — Encounter: Payer: Self-pay | Admitting: Cardiology

## 2012-04-26 ENCOUNTER — Institutional Professional Consult (permissible substitution): Payer: Medicare Other | Admitting: Cardiology

## 2012-07-05 ENCOUNTER — Encounter (HOSPITAL_COMMUNITY): Payer: Self-pay

## 2012-07-05 ENCOUNTER — Emergency Department (HOSPITAL_COMMUNITY)
Admission: EM | Admit: 2012-07-05 | Discharge: 2012-07-05 | Disposition: A | Discharge status: Home / Self Care | Payer: Medicare Other | Attending: Emergency Medicine | Admitting: Emergency Medicine

## 2012-07-05 ENCOUNTER — Emergency Department (HOSPITAL_COMMUNITY): Payer: Medicare Other

## 2012-07-05 DIAGNOSIS — R5381 Other malaise: Secondary | ICD-10-CM | POA: Insufficient documentation

## 2012-07-05 DIAGNOSIS — Z7982 Long term (current) use of aspirin: Secondary | ICD-10-CM | POA: Insufficient documentation

## 2012-07-05 DIAGNOSIS — Z8719 Personal history of other diseases of the digestive system: Secondary | ICD-10-CM | POA: Insufficient documentation

## 2012-07-05 DIAGNOSIS — M129 Arthropathy, unspecified: Secondary | ICD-10-CM | POA: Insufficient documentation

## 2012-07-05 DIAGNOSIS — R05 Cough: Secondary | ICD-10-CM | POA: Insufficient documentation

## 2012-07-05 DIAGNOSIS — R5383 Other fatigue: Secondary | ICD-10-CM | POA: Insufficient documentation

## 2012-07-05 DIAGNOSIS — R109 Unspecified abdominal pain: Secondary | ICD-10-CM | POA: Insufficient documentation

## 2012-07-05 DIAGNOSIS — Z79899 Other long term (current) drug therapy: Secondary | ICD-10-CM | POA: Insufficient documentation

## 2012-07-05 DIAGNOSIS — D649 Anemia, unspecified: Secondary | ICD-10-CM | POA: Insufficient documentation

## 2012-07-05 DIAGNOSIS — R059 Cough, unspecified: Secondary | ICD-10-CM | POA: Insufficient documentation

## 2012-07-05 DIAGNOSIS — Z791 Long term (current) use of non-steroidal anti-inflammatories (NSAID): Secondary | ICD-10-CM | POA: Insufficient documentation

## 2012-07-05 DIAGNOSIS — I10 Essential (primary) hypertension: Secondary | ICD-10-CM | POA: Insufficient documentation

## 2012-07-05 DIAGNOSIS — R531 Weakness: Secondary | ICD-10-CM

## 2012-07-05 LAB — CBC WITH DIFFERENTIAL/PLATELET
Basophils Relative: 1 % (ref 0–1)
Eosinophils Absolute: 0.2 10*3/uL (ref 0.0–0.7)
HCT: 36.3 % (ref 36.0–46.0)
Hemoglobin: 11.9 g/dL — ABNORMAL LOW (ref 12.0–15.0)
Lymphs Abs: 1.7 10*3/uL (ref 0.7–4.0)
MCH: 27.3 pg (ref 26.0–34.0)
MCHC: 32.8 g/dL (ref 30.0–36.0)
Monocytes Absolute: 0.7 10*3/uL (ref 0.1–1.0)
Monocytes Relative: 9 % (ref 3–12)
RBC: 4.36 MIL/uL (ref 3.87–5.11)

## 2012-07-05 LAB — COMPREHENSIVE METABOLIC PANEL
Albumin: 4 g/dL (ref 3.5–5.2)
BUN: 21 mg/dL (ref 6–23)
Chloride: 103 mEq/L (ref 96–112)
Creatinine, Ser: 0.86 mg/dL (ref 0.50–1.10)
GFR calc Af Amer: 73 mL/min — ABNORMAL LOW (ref >90)
Glucose, Bld: 83 mg/dL (ref 70–99)
Total Bilirubin: 0.4 mg/dL (ref 0.3–1.2)
Total Protein: 6.9 g/dL (ref 6.0–8.3)

## 2012-07-05 LAB — URINALYSIS, ROUTINE W REFLEX MICROSCOPIC
Bilirubin Urine: NEGATIVE
Glucose, UA: NEGATIVE mg/dL
Hgb urine dipstick: NEGATIVE
Ketones, ur: NEGATIVE mg/dL
pH: 7 (ref 5.0–8.0)

## 2012-07-05 LAB — POCT I-STAT TROPONIN I: Troponin i, poc: 0 ng/mL (ref 0.00–0.08)

## 2012-07-05 MED ORDER — OMEPRAZOLE 20 MG PO CPDR: 20.0000 mg | DELAYED_RELEASE_CAPSULE | Freq: Every day | ORAL | Status: DC

## 2012-07-05 NOTE — ED Notes (Signed)
MD at bedside. 

## 2012-07-05 NOTE — ED Notes (Signed)
Pt states she has felt weak x 1 week.  States her mouth is very bitter (hx GERD) x 1 week also.  C/o abdominal pain that radiates to Rt side.  Denies dysuria but unsure if has had a fever. Also has a dry cough, feeling it's d/t allergies

## 2012-07-05 NOTE — ED Notes (Signed)
Pt Unable to void at this time.

## 2012-07-05 NOTE — ED Provider Notes (Signed)
History     CSN: [id number]  Arrival date & time 07/05/12  1413   First MD Initiated Contact with Patient 07/05/12 1448      Chief Complaint  Patient presents with  . Weakness     HPI Pt states she has felt weak x 1 week. States her mouth is very bitter (hx GERD) x 1 week also. C/o abdominal pain that radiates to Rt side. Denies dysuria but unsure if has had a fever. Also has a dry cough, feeling it's d/t allergies.  No tick exposure  Past Medical History  Diagnosis Date  . Hypertension   . Arthritis     Past Surgical History  Procedure Laterality Date  . Shoulder surgery      Right    History reviewed. No pertinent family history.  History  Substance Use Topics  . Smoking status: Never Smoker   . Smokeless tobacco: Not on file  . Alcohol Use: No    OB History   Grav Para Term Preterm Abortions TAB SAB Ect Mult Living                  Review of Systems  Allergies  Review of patient's allergies indicates no known allergies.  Home Medications   Current Outpatient Rx  Name  Route  Sig  Dispense  Refill  . Aspirin-Caffeine (BAYER BACK & BODY PAIN EX ST PO)   Oral   Take 2 tablets by mouth 2 (two) times daily.         . Calcium Carbonate-Vit D-Min (CALCIUM 600 + MINERALS) 600-200 MG-UNIT TABS   Oral   Take 1 tablet by mouth daily.         . diphenhydrAMINE (BENADRYL) 25 MG tablet   Oral   Take 25 mg by mouth every 6 (six) hours as needed for allergies.         Marland Kitchen gabapentin (NEURONTIN) 300 MG capsule   Oral   Take 300 mg by mouth 3 (three) times daily.         Marland Kitchen losartan-hydrochlorothiazide (HYZAAR) 100-12.5 MG per tablet   Oral   Take 1 tablet by mouth daily.         . meloxicam (MOBIC) 7.5 MG tablet   Oral   Take 7.5 mg by mouth daily.         . vitamin B-12 (CYANOCOBALAMIN) 1000 MCG tablet   Oral   Take 1,000 mcg by mouth daily.           BP 143/77  Pulse 63  Temp(Src) 97.5 F (36.4 C) (Oral)  Resp 16  Ht 5\' 2"  (1.575  m)  Wt 165 lb (74.844 kg)  BMI 30.17 kg/m2  SpO2 100%  Physical Exam  Nursing note and vitals reviewed. Constitutional: She is oriented to person, place, and time. She appears well-developed and well-nourished. No distress.  HENT:  Head: Normocephalic and atraumatic.  Eyes: Pupils are equal, round, and reactive to light.  Neck: Normal range of motion.  Cardiovascular: Normal rate and intact distal pulses.   Pulmonary/Chest: No respiratory distress.  Abdominal: Normal appearance. She exhibits no distension and no mass. There is no tenderness. There is no rebound and no guarding.  Musculoskeletal: Normal range of motion.  Neurological: She is alert and oriented to person, place, and time. No cranial nerve deficit.  Skin: Skin is warm and dry. No rash noted.  Psychiatric: She has a normal mood and affect. Her behavior is normal.    ED  Course  Procedures (including critical care time)  Date: 07/05/2012  Rate: 62  Rhythm: normal sinus rhythm  QRS Axis: normal  Intervals: normal  ST/T Wave abnormalities: normal  Conduction Disutrbances: Poor R-wave progression  Narrative Interpretation: unremarkable     Labs Reviewed  URINALYSIS, ROUTINE W REFLEX MICROSCOPIC - Abnormal; Notable for the following:    APPearance CLOUDY (*)    All other components within normal limits  CBC WITH DIFFERENTIAL - Abnormal; Notable for the following:    Hemoglobin 11.9 (*)    All other components within normal limits  COMPREHENSIVE METABOLIC PANEL - Abnormal; Notable for the following:    GFR calc non Af Amer 63 (*)    GFR calc Af Amer 73 (*)    All other components within normal limits  POCT I-STAT TROPONIN I   Dg Chest 2 View  07/05/2012   *RADIOLOGY REPORT*  Clinical Data: Fatigue and weakness  CHEST - 2 VIEW  Comparison: 04/03/2010  Findings: Heart size is normal.  There is no pleural effusion or edema.  No airspace consolidation identified.  Mild spondylosis identified within the thoracic  spine.  There are degenerative changes noted within both shoulders.  IMPRESSION:  1.  No acute findings.   Original Report Authenticated By: Signa Kell, M.D.     1. Weakness   2. Anemia       MDM   Encourage the patient to followup with her primary care physician next week for further evaluation.       Nelia Shi, MD 07/05/12 [phone]

## 2014-05-01 DIAGNOSIS — Z1389 Encounter for screening for other disorder: Secondary | ICD-10-CM | POA: Diagnosis not present

## 2014-05-01 DIAGNOSIS — E785 Hyperlipidemia, unspecified: Secondary | ICD-10-CM | POA: Diagnosis not present

## 2014-05-01 DIAGNOSIS — I10 Essential (primary) hypertension: Secondary | ICD-10-CM | POA: Diagnosis not present

## 2014-05-01 DIAGNOSIS — Z6830 Body mass index (BMI) 30.0-30.9, adult: Secondary | ICD-10-CM | POA: Diagnosis not present

## 2014-05-01 DIAGNOSIS — F5104 Psychophysiologic insomnia: Secondary | ICD-10-CM | POA: Diagnosis not present

## 2014-05-01 DIAGNOSIS — M810 Age-related osteoporosis without current pathological fracture: Secondary | ICD-10-CM | POA: Diagnosis not present

## 2014-05-01 DIAGNOSIS — Z0000 Encounter for general adult medical examination without abnormal findings: Secondary | ICD-10-CM | POA: Diagnosis not present

## 2014-05-02 ENCOUNTER — Other Ambulatory Visit: Payer: Self-pay | Admitting: Internal Medicine

## 2014-05-02 DIAGNOSIS — Z1231 Encounter for screening mammogram for malignant neoplasm of breast: Secondary | ICD-10-CM

## 2014-05-02 DIAGNOSIS — E2839 Other primary ovarian failure: Secondary | ICD-10-CM

## 2014-05-28 ENCOUNTER — Ambulatory Visit: Payer: Self-pay

## 2014-05-28 ENCOUNTER — Other Ambulatory Visit: Payer: Self-pay

## 2014-07-11 ENCOUNTER — Observation Stay (HOSPITAL_COMMUNITY): Payer: Commercial Managed Care - HMO

## 2014-07-11 ENCOUNTER — Observation Stay (HOSPITAL_COMMUNITY)
Admission: EM | Admit: 2014-07-11 | Discharge: 2014-07-14 | Disposition: A | Discharge status: Home / Self Care | Payer: Commercial Managed Care - HMO | Attending: Internal Medicine | Admitting: Internal Medicine

## 2014-07-11 ENCOUNTER — Encounter (HOSPITAL_COMMUNITY): Payer: Self-pay | Admitting: *Deleted

## 2014-07-11 DIAGNOSIS — I10 Essential (primary) hypertension: Secondary | ICD-10-CM | POA: Diagnosis not present

## 2014-07-11 DIAGNOSIS — G629 Polyneuropathy, unspecified: Secondary | ICD-10-CM | POA: Insufficient documentation

## 2014-07-11 DIAGNOSIS — R42 Dizziness and giddiness: Secondary | ICD-10-CM

## 2014-07-11 DIAGNOSIS — M1990 Unspecified osteoarthritis, unspecified site: Secondary | ICD-10-CM | POA: Diagnosis not present

## 2014-07-11 DIAGNOSIS — I639 Cerebral infarction, unspecified: Secondary | ICD-10-CM | POA: Diagnosis not present

## 2014-07-11 DIAGNOSIS — E785 Hyperlipidemia, unspecified: Secondary | ICD-10-CM | POA: Insufficient documentation

## 2014-07-11 DIAGNOSIS — I350 Nonrheumatic aortic (valve) stenosis: Secondary | ICD-10-CM | POA: Diagnosis not present

## 2014-07-11 DIAGNOSIS — R531 Weakness: Secondary | ICD-10-CM | POA: Diagnosis not present

## 2014-07-11 DIAGNOSIS — K219 Gastro-esophageal reflux disease without esophagitis: Secondary | ICD-10-CM | POA: Diagnosis not present

## 2014-07-11 DIAGNOSIS — J8410 Pulmonary fibrosis, unspecified: Secondary | ICD-10-CM | POA: Diagnosis not present

## 2014-07-11 DIAGNOSIS — Z7982 Long term (current) use of aspirin: Secondary | ICD-10-CM | POA: Diagnosis not present

## 2014-07-11 DIAGNOSIS — R404 Transient alteration of awareness: Secondary | ICD-10-CM | POA: Diagnosis not present

## 2014-07-11 DIAGNOSIS — Z7901 Long term (current) use of anticoagulants: Secondary | ICD-10-CM | POA: Insufficient documentation

## 2014-07-11 DIAGNOSIS — R55 Syncope and collapse: Secondary | ICD-10-CM | POA: Diagnosis not present

## 2014-07-11 LAB — BASIC METABOLIC PANEL
Anion gap: 11 (ref 5–15)
BUN: 23 mg/dL — ABNORMAL HIGH (ref 6–20)
CALCIUM: 9.5 mg/dL (ref 8.9–10.3)
CO2: 25 mmol/L (ref 22–32)
Chloride: 105 mmol/L (ref 101–111)
Creatinine, Ser: 1.04 mg/dL — ABNORMAL HIGH (ref 0.44–1.00)
GFR calc Af Amer: 57 mL/min — ABNORMAL LOW (ref >60)
GFR, EST NON AFRICAN AMERICAN: 49 mL/min — AB (ref >60)
Glucose, Bld: 98 mg/dL (ref 65–99)
Potassium: 4.1 mmol/L (ref 3.5–5.1)
SODIUM: 141 mmol/L (ref 135–145)

## 2014-07-11 LAB — CBC
HEMATOCRIT: 37.7 % (ref 36.0–46.0)
HEMOGLOBIN: 12.1 g/dL (ref 12.0–15.0)
MCH: 27.8 pg (ref 26.0–34.0)
MCHC: 32.1 g/dL (ref 30.0–36.0)
MCV: 86.5 fL (ref 78.0–100.0)
PLATELETS: 283 10*3/uL (ref 150–400)
RBC: 4.36 MIL/uL (ref 3.87–5.11)
RDW: 14.2 % (ref 11.5–15.5)
WBC: 9.9 10*3/uL (ref 4.0–10.5)

## 2014-07-11 LAB — I-STAT TROPONIN, ED: TROPONIN I, POC: 0 ng/mL (ref 0.00–0.08)

## 2014-07-11 LAB — I-STAT CG4 LACTIC ACID, ED: Lactic Acid, Venous: 1.27 mmol/L (ref 0.5–2.0)

## 2014-07-11 MED ORDER — GABAPENTIN 300 MG PO CAPS
300.0000 mg | ORAL_CAPSULE | Freq: Three times a day (TID) | ORAL | Status: DC
  Administered 2014-07-12 – 2014-07-14 (×8): 300 mg via ORAL
  Filled 2014-07-11 (×9): qty 1

## 2014-07-11 MED ORDER — HYDROCHLOROTHIAZIDE 12.5 MG PO CAPS
12.5000 mg | ORAL_CAPSULE | Freq: Every day | ORAL | Status: DC
  Administered 2014-07-12 – 2014-07-14 (×3): 12.5 mg via ORAL
  Filled 2014-07-11 (×3): qty 1

## 2014-07-11 MED ORDER — VITAMIN B-12 1000 MCG PO TABS
1000.0000 ug | ORAL_TABLET | Freq: Every day | ORAL | Status: DC
  Administered 2014-07-12 – 2014-07-14 (×3): 1000 ug via ORAL
  Filled 2014-07-11 (×3): qty 1

## 2014-07-11 MED ORDER — SODIUM CHLORIDE 0.9 % IV BOLUS (SEPSIS)
1000.0000 mL | Freq: Once | INTRAVENOUS | Status: AC
  Administered 2014-07-11: 1000 mL via INTRAVENOUS

## 2014-07-11 MED ORDER — SODIUM CHLORIDE 0.9 % IJ SOLN
3.0000 mL | Freq: Two times a day (BID) | INTRAMUSCULAR | Status: DC
  Administered 2014-07-12 – 2014-07-14 (×6): 3 mL via INTRAVENOUS

## 2014-07-11 MED ORDER — ACETAMINOPHEN 650 MG RE SUPP: 650.0000 mg | Freq: Four times a day (QID) | RECTAL | Status: DC | PRN

## 2014-07-11 MED ORDER — LOSARTAN POTASSIUM-HCTZ 100-12.5 MG PO TABS: 1.0000 | ORAL_TABLET | Freq: Every day | ORAL | Status: DC

## 2014-07-11 MED ORDER — ACETAMINOPHEN 325 MG PO TABS
650.0000 mg | ORAL_TABLET | Freq: Four times a day (QID) | ORAL | Status: DC | PRN
  Administered 2014-07-13: 650 mg via ORAL
  Filled 2014-07-11: qty 2

## 2014-07-11 MED ORDER — LOSARTAN POTASSIUM 50 MG PO TABS
100.0000 mg | ORAL_TABLET | Freq: Every day | ORAL | Status: DC
  Administered 2014-07-12 – 2014-07-14 (×3): 100 mg via ORAL
  Filled 2014-07-11 (×3): qty 2

## 2014-07-11 MED ORDER — PRAVASTATIN SODIUM 20 MG PO TABS
20.0000 mg | ORAL_TABLET | Freq: Every day | ORAL | Status: DC
  Administered 2014-07-12 – 2014-07-13 (×2): 20 mg via ORAL
  Filled 2014-07-11 (×3): qty 1

## 2014-07-11 MED ORDER — ONDANSETRON HCL 4 MG PO TABS: 4.0000 mg | ORAL_TABLET | Freq: Four times a day (QID) | ORAL | Status: DC | PRN

## 2014-07-11 MED ORDER — HEPARIN SODIUM (PORCINE) 5000 UNIT/ML IJ SOLN
5000.0000 [IU] | Freq: Three times a day (TID) | INTRAMUSCULAR | Status: DC
  Administered 2014-07-12 – 2014-07-14 (×8): 5000 [IU] via SUBCUTANEOUS
  Filled 2014-07-11 (×9): qty 1

## 2014-07-11 MED ORDER — ASPIRIN 325 MG PO TABS
325.0000 mg | ORAL_TABLET | Freq: Every day | ORAL | Status: DC
  Administered 2014-07-12 – 2014-07-14 (×3): 325 mg via ORAL
  Filled 2014-07-11 (×3): qty 1

## 2014-07-11 MED ORDER — ONDANSETRON HCL 4 MG/2ML IJ SOLN: 4.0000 mg | Freq: Four times a day (QID) | INTRAMUSCULAR | Status: DC | PRN

## 2014-07-11 NOTE — Progress Notes (Signed)
EDCM spoke to patient at bedside.  Patient confirms her pcp is Dr. [name].  Patient listed as not having insurance, patient confirms she has insurance.

## 2014-07-11 NOTE — ED Notes (Signed)
Bed: HL45 Expected date:  Expected time:  Means of arrival:  Comments: EMS- 81yo F, syncope/weakness

## 2014-07-11 NOTE — ED Notes (Signed)
Was standing on her porch, felt weak, legs heavy and fell down to floor. Unsure if passed out. No orthostasis. CBG 129. Grip strength equal, no drift. A&o x 3.   "just weak feeling now"

## 2014-07-11 NOTE — ED Provider Notes (Signed)
CSN: [id number]     Arrival date & time 07/11/14  1718 History   First MD Initiated Contact with Patient 07/11/14 1725     Chief Complaint  Patient presents with  . Loss of Consciousness     (Consider location/radiation/quality/duration/timing/severity/associated sxs/prior Treatment) Patient is a 81 y.o. female presenting with syncope. The history is provided by the patient.  Loss of Consciousness Episode history:  Single Most recent episode:  Today Timing:  Constant Progression:  Resolved Chronicity:  New Context: normal activity   Witnessed: no   Relieved by:  Nothing Worsened by:  Nothing tried Associated symptoms: no fever, no shortness of breath and no vomiting     Past Medical History  Diagnosis Date  . Hypertension   . Arthritis    Past Surgical History  Procedure Laterality Date  . Shoulder surgery      Right   History reviewed. No pertinent family history. History  Substance Use Topics  . Smoking status: Never Smoker   . Smokeless tobacco: Not on file  . Alcohol Use: No   OB History    No data available     Review of Systems  Constitutional: Negative for fever.  Respiratory: Negative for cough and shortness of breath.   Cardiovascular: Positive for syncope.  Gastrointestinal: Negative for vomiting and abdominal pain.  All other systems reviewed and are negative.     Allergies  Review of patient's allergies indicates no known allergies.  Home Medications   Prior to Admission medications   Medication Sig Start Date End Date Taking? Authorizing Provider  aspirin 325 MG tablet Take 325 mg by mouth 2 (two) times daily.   Yes Historical Provider, MD  Calcium Carbonate-Vit D-Min (CALCIUM 600 + MINERALS) 600-200 MG-UNIT TABS Take 1-2 tablets by mouth daily. Take 2 tablets (1200 mg) in the am and Take 1 tablet (600 mg) at the pm.   Yes Historical Provider, MD  esomeprazole (NEXIUM) 20 MG capsule Take 20 mg by mouth daily as needed (acid reflux).   Yes  Historical Provider, MD  gabapentin (NEURONTIN) 300 MG capsule Take 300 mg by mouth 3 (three) times daily.   Yes Historical Provider, MD  losartan-hydrochlorothiazide (HYZAAR) 100-12.5 MG per tablet Take 1 tablet by mouth daily.   Yes Historical Provider, MD  magnesium oxide (MAG-OX) 400 MG tablet Take 400 mg by mouth daily.   Yes Historical Provider, MD  pravastatin (PRAVACHOL) 20 MG tablet Take 20 mg by mouth daily.   Yes Historical Provider, MD  vitamin B-12 (CYANOCOBALAMIN) 1000 MCG tablet Take 1,000 mcg by mouth daily.   Yes Historical Provider, MD  omeprazole (PRILOSEC) 20 MG capsule Take 1 capsule (20 mg total) by mouth daily. Patient not taking: Reported on 07/11/2014 07/05/12   Leonard Schwartz, MD   BP 156/69 mmHg  Pulse 64  Temp(Src) 98.1 F (36.7 C) (Oral)  Resp 20  SpO2 99% Physical Exam  Constitutional: She is oriented to person, place, and time. She appears well-developed and well-nourished. No distress.  HENT:  Head: Normocephalic and atraumatic.  Mouth/Throat: Oropharynx is clear and moist.  Eyes: EOM are normal. Pupils are equal, round, and reactive to light.  Neck: Normal range of motion. Neck supple.  Cardiovascular: Normal rate and regular rhythm.  Exam reveals no friction rub.   No murmur heard. Pulmonary/Chest: Effort normal and breath sounds normal. No respiratory distress. She has no wheezes. She has no rales.  Abdominal: Soft. She exhibits no distension. There is no tenderness. There is  no rebound.  Musculoskeletal: Normal range of motion. She exhibits no edema.  Neurological: She is alert and oriented to person, place, and time. No cranial nerve deficit. She exhibits normal muscle tone. Coordination normal.  Skin: No rash noted. She is not diaphoretic.  Nursing note and vitals reviewed.   ED Course  Procedures (including critical care time) Labs Review Labs Reviewed  CBC  BASIC METABOLIC PANEL  I-STAT [place], ED  I-STAT CG4 LACTIC ACID, ED    Imaging  Review No results found.   EKG Interpretation   Date/Time:  Friday Jul 11 2014 18:14:15 EDT Ventricular Rate:  72 PR Interval:  178 QRS Duration: 78 QT Interval:  368 QTC Calculation: 403 R Axis:   -12 Text Interpretation:  Sinus rhythm Inferior infarct, old No significant  change since last tracing Confirmed by Mingo Amber  MD, [place] [phone]) on  07/11/2014 6:44:29 PM      MDM   Final diagnoses:  Dizziness  Near-syncope.  81 year old female here with syncopal episode. She been standing and walking in between outside in her house Route 15 minutes. She felt weak, had dim vision and then collapsed. She thinks she did not completely pass out. She is not orthostatic here, but said she got dizzy when she stood up. Normal blood sugar. Here she says she's feeling okay, just mildly weak. Vitals are stable. Neurologic exam is normal. Belly is benign. She did report history of tachydysrhythmia that cardiology wanted to put a pacemaker in for, but she refused at that time. This was about 40 years ago. She denied any palpitations today. EKG here is normal. Will check labs and observe. Labs ok. Admitted.    Evelina Bucy, MD 07/11/14 2352

## 2014-07-11 NOTE — ED Notes (Signed)
MD at bedside. Hospitalist at bedside. Dr. Posey Pronto at bedside.

## 2014-07-12 ENCOUNTER — Observation Stay (HOSPITAL_BASED_OUTPATIENT_CLINIC_OR_DEPARTMENT_OTHER): Payer: Commercial Managed Care - HMO

## 2014-07-12 ENCOUNTER — Observation Stay (HOSPITAL_COMMUNITY): Payer: Commercial Managed Care - HMO

## 2014-07-12 DIAGNOSIS — I350 Nonrheumatic aortic (valve) stenosis: Secondary | ICD-10-CM | POA: Diagnosis not present

## 2014-07-12 DIAGNOSIS — I10 Essential (primary) hypertension: Secondary | ICD-10-CM | POA: Diagnosis present

## 2014-07-12 DIAGNOSIS — M1990 Unspecified osteoarthritis, unspecified site: Secondary | ICD-10-CM

## 2014-07-12 DIAGNOSIS — K219 Gastro-esophageal reflux disease without esophagitis: Secondary | ICD-10-CM | POA: Diagnosis not present

## 2014-07-12 DIAGNOSIS — Z7982 Long term (current) use of aspirin: Secondary | ICD-10-CM | POA: Diagnosis not present

## 2014-07-12 DIAGNOSIS — R55 Syncope and collapse: Secondary | ICD-10-CM

## 2014-07-12 DIAGNOSIS — E785 Hyperlipidemia, unspecified: Secondary | ICD-10-CM | POA: Diagnosis not present

## 2014-07-12 DIAGNOSIS — J8410 Pulmonary fibrosis, unspecified: Secondary | ICD-10-CM | POA: Diagnosis not present

## 2014-07-12 DIAGNOSIS — G629 Polyneuropathy, unspecified: Secondary | ICD-10-CM | POA: Diagnosis not present

## 2014-07-12 LAB — COMPREHENSIVE METABOLIC PANEL
ALT: 14 U/L (ref 14–54)
ANION GAP: 10 (ref 5–15)
AST: 19 U/L (ref 15–41)
Albumin: 3.6 g/dL (ref 3.5–5.0)
Alkaline Phosphatase: 57 U/L (ref 38–126)
BUN: 25 mg/dL — AB (ref 6–20)
CO2: 24 mmol/L (ref 22–32)
Calcium: 8.7 mg/dL — ABNORMAL LOW (ref 8.9–10.3)
Chloride: 104 mmol/L (ref 101–111)
Creatinine, Ser: 0.86 mg/dL (ref 0.44–1.00)
GFR calc Af Amer: >60 mL/min (ref >60)
GFR calc non Af Amer: >60 mL/min (ref >60)
GLUCOSE: 98 mg/dL (ref 65–99)
POTASSIUM: 3.7 mmol/L (ref 3.5–5.1)
Sodium: 138 mmol/L (ref 135–145)
Total Bilirubin: 0.9 mg/dL (ref 0.3–1.2)
Total Protein: 6.4 g/dL — ABNORMAL LOW (ref 6.5–8.1)

## 2014-07-12 LAB — LIPID PANEL
CHOL/HDL RATIO: 3.4 ratio
Cholesterol: 189 mg/dL (ref 0–200)
HDL: 56 mg/dL (ref >40)
LDL CALC: 113 mg/dL — AB (ref 0–99)
Triglycerides: 100 mg/dL (ref <150)
VLDL: 20 mg/dL (ref 0–40)

## 2014-07-12 LAB — CBC WITH DIFFERENTIAL/PLATELET
Basophils Absolute: 0 10*3/uL (ref 0.0–0.1)
Basophils Relative: 0 % (ref 0–1)
EOS PCT: 3 % (ref 0–5)
Eosinophils Absolute: 0.2 10*3/uL (ref 0.0–0.7)
HEMATOCRIT: 33.9 % — AB (ref 36.0–46.0)
Hemoglobin: 10.7 g/dL — ABNORMAL LOW (ref 12.0–15.0)
LYMPHS ABS: 2.2 10*3/uL (ref 0.7–4.0)
LYMPHS PCT: 31 % (ref 12–46)
MCH: 27.3 pg (ref 26.0–34.0)
MCHC: 31.6 g/dL (ref 30.0–36.0)
MCV: 86.5 fL (ref 78.0–100.0)
Monocytes Absolute: 0.6 10*3/uL (ref 0.1–1.0)
Monocytes Relative: 8 % (ref 3–12)
NEUTROS ABS: 4.1 10*3/uL (ref 1.7–7.7)
NEUTROS PCT: 58 % (ref 43–77)
Platelets: 249 10*3/uL (ref 150–400)
RBC: 3.92 MIL/uL (ref 3.87–5.11)
RDW: 14.4 % (ref 11.5–15.5)
WBC: 7.1 10*3/uL (ref 4.0–10.5)

## 2014-07-12 LAB — PROTIME-INR
INR: 1.09 (ref 0.00–1.49)
Prothrombin Time: 14.3 seconds (ref 11.6–15.2)

## 2014-07-12 LAB — GLUCOSE, CAPILLARY: GLUCOSE-CAPILLARY: 82 mg/dL (ref 65–99)

## 2014-07-12 MED ORDER — PANTOPRAZOLE SODIUM 40 MG PO TBEC
40.0000 mg | DELAYED_RELEASE_TABLET | Freq: Every day | ORAL | Status: DC
  Administered 2014-07-12 – 2014-07-14 (×3): 40 mg via ORAL
  Filled 2014-07-12 (×3): qty 1

## 2014-07-12 MED ORDER — CALCIUM CARBONATE-VITAMIN D 500-200 MG-UNIT PO TABS
1.0000 | ORAL_TABLET | Freq: Every day | ORAL | Status: DC
  Administered 2014-07-12 – 2014-07-14 (×3): 1 via ORAL
  Filled 2014-07-12 (×3): qty 1

## 2014-07-12 MED ORDER — CALCIUM 600 + MINERALS 600-200 MG-UNIT PO TABS: 1.0000 | ORAL_TABLET | Freq: Every day | ORAL | Status: DC

## 2014-07-12 MED ORDER — DIPHENHYDRAMINE HCL 25 MG PO CAPS
25.0000 mg | ORAL_CAPSULE | Freq: Once | ORAL | Status: AC
  Administered 2014-07-12: 25 mg via ORAL
  Filled 2014-07-12: qty 1

## 2014-07-12 NOTE — Progress Notes (Signed)
Patient ID: Michelle [name], female   DOB: 1930-11-06, 81 y.o.   MRN: [id number]  [facility] PROGRESS NOTE  Michelle [name] TZG:[id number] DOB: [birth year] DOA: 07/11/2014 PCP: Donnajean Lopes, MD   Brief narrative:    Pt is 81 yo female who presented to [facility] ED with main concern of sudden onset of lightheadedness and dizziness, unsteady gait and near syncopethat occurred several hours prior to this admission. [facility] admitted for evaluation of stroke.   Assessment/Plan:    Principal Problem: Dizziness adn lightheadedness - MRI brain with no acute infarction but chronic left cerebellar stroke is noted - 2D ECHO with normal EF and no signs of an embolic source - PT evaluation requested - risk factors control as possible  Active Problems:   HTN - reasonable inpatient control - continue home medical regimen    PULMONARY FIBROSIS - respiratory status is stable - pt is maintaining oxygen saturation at target range    Acute renal failure - pre renal in etiology - now resolved - repeat BMP in AM  DVT prophylaxis - Heparin SQ  Code Status: Full.  Family Communication:  plan of care discussed with the patient Disposition Plan: Home when stable.   IV access:  Peripheral IV  Procedures and diagnostic studies:    Ct Head Wo Contrast 07/12/2014   Small vessel infarct in the left cerebellum is notable due to the history and is age-indeterminate. 2. Mild cerebral small-vessel disease.     Mr Brain Wo Contrast 07/12/2014  Chronic LEFT cerebellar infarct. No acute stroke or posterior fossa mass.  No evidence for diminished flow void signal in the vertebral arteries or basilar artery.  Mild atrophy with moderately advanced chronic microvascular ischemic change.    Medical Consultants:  None  Other Consultants:  PT/OT  IAnti-Infectives:   None  Faye Ramsay, MD  [facility] Pager [phone]  If 7PM-7AM, please contact night-coverage [email or web address] Password [facility] 07/12/2014, 3:36  PM     HPI/Subjective: No events overnight.   Objective: Filed Vitals:   07/11/14 2315 07/12/14 0206 07/12/14 0500 07/12/14 1422  BP:  128/55  139/61  Pulse:  70  70  Temp:  97.9 F (36.6 C)  98.4 F (36.9 C)  TempSrc:  Oral    Resp:  18  18  Height: 5\' 2"  (1.575 m)     Weight: 71.2 kg (156 lb 15.5 oz)  71 kg (156 lb 8.4 oz)   SpO2:  95%  99%    Intake/Output Summary (Last 24 hours) at 07/12/14 1536 Last data filed at 07/12/14 0849  Gross per 24 hour  Intake    440 ml  Output      0 ml  Net    440 ml    Exam:   General:  Pt is alert, follows commands appropriately, not in acute distress  Cardiovascular: Regular rate and rhythm, S1/S2, no murmurs, no rubs, no gallops  Respiratory: Clear to auscultation bilaterally, no wheezing, no crackles, no rhonchi  Abdomen: Soft, non tender, non distended, bowel sounds present, no guarding  Extremities: pulses DP and PT palpable bilaterally  Neuro: Grossly nonfocal  Data Reviewed: Basic Metabolic Panel:  Recent Labs Lab 07/11/14 1846 07/12/14 0645  NA 141 138  K 4.1 3.7  CL 105 104  CO2 25 24  GLUCOSE 98 98  BUN 23* 25*  CREATININE 1.04* 0.86  CALCIUM 9.5 8.7*   Liver Function Tests:  Recent Labs Lab 07/12/14 0645  AST 19  ALT 14  ALKPHOS  57  BILITOT 0.9  PROT 6.4*  ALBUMIN 3.6   CBC:  Recent Labs Lab 07/11/14 1846 07/12/14 0645  WBC 9.9 7.1  NEUTROABS  --  4.1  HGB 12.1 10.7*  HCT 37.7 33.9*  MCV 86.5 86.5  PLT 283 249   CBG:  Recent Labs Lab 07/12/14 0728  GLUCAP 82   Scheduled Meds: . aspirin  325 mg Oral Daily  . gabapentin  300 mg Oral TID  . heparin  5,000 Units Subcutaneous 3 times per day  . hydrochlorothiazide  12.5 mg Oral Daily  . losartan  100 mg Oral Daily  . pravastatin  20 mg Oral q1800  . sodium chloride  3 mL Intravenous Q12H  . vitamin B-12  1,000 mcg Oral Daily   Continuous Infusions:

## 2014-07-12 NOTE — Evaluation (Signed)
Physical Therapy Evaluation Patient Details Name: Michelle [name] MRN: [id number] DOB: October 02, 1930 Today's Date: 07/12/2014   History of Present Illness  The patient is presenting 07/11/14 with complaints of dizziness and lightheadedness that is present since she has woken up this morning. Patient mentions she has been having difficulty walking in a straight line at home and has been bumping into things  Clinical Impression  Patient  Ambulated x 100', a bit" weak" per patient.. BP sup 132/74, after standing and ambulating x 10' 166/64, no dizziness. Patient will benefit from PT to address problems listed in note below.    Follow Up Recommendations  (may not require HH)    Equipment Recommendations  None recommended by PT    Recommendations for Other Services       Precautions / Restrictions Precautions Precautions: Fall Restrictions Weight Bearing Restrictions: No      Mobility  Bed Mobility Overal bed mobility: Independent                Transfers Overall transfer level: Needs assistance Equipment used: None Transfers: Sit to/from Stand;Stand Pivot Transfers Sit to Stand: Min guard Stand pivot transfers: Min guard       General transfer comment: cues for safety  Ambulation/Gait Ambulation/Gait assistance: Min guard;Min assist Ambulation Distance (Feet): 100 Feet Assistive device: 1 person hand held assist Gait Pattern/deviations: Step-through pattern;Drifts right/left     General Gait Details: patient ambulates slowly, occassional balance loss mildly, laterally  Stairs            Wheelchair Mobility    Modified Rankin (Stroke Patients Only)       Balance Overall balance assessment: Needs assistance Sitting-balance support: No upper extremity supported;Feet supported Sitting balance-Leahy Scale: Normal     Standing balance support: During functional activity;No upper extremity supported Standing balance-Leahy Scale: Fair                               Pertinent Vitals/Pain Pain Assessment: 0-10 Pain Score: 3  Pain Location: arthritis all over Pain Descriptors / Indicators: Aching Pain Intervention(s): Monitored during session    Home Living Family/patient expects to be discharged to:: Private residence Living Arrangements: Alone Available Help at Discharge: Family;Friend(s);Available PRN/intermittently Type of Home: House Home Access: Stairs to enter Entrance Stairs-Rails: Chemical engineer of Steps: 3 Home Layout: One level Home Equipment: None      Prior Function Level of Independence: Independent               Hand Dominance        Extremity/Trunk Assessment   Upper Extremity Assessment: Generalized weakness           Lower Extremity Assessment: Generalized weakness      Cervical / Trunk Assessment: Kyphotic  Communication   Communication: No difficulties  Cognition Arousal/Alertness: Awake/alert Behavior During Therapy: WFL for tasks assessed/performed Overall Cognitive Status: Within Functional Limits for tasks assessed                      General Comments      Exercises        Assessment/Plan    PT Assessment Patient needs continued PT services  PT Diagnosis Difficulty walking;Generalized weakness   PT Problem List Decreased activity tolerance;Decreased balance;Decreased mobility;Decreased safety awareness  PT Treatment Interventions Gait training;Functional mobility training;Therapeutic activities;Therapeutic exercise;Balance training;Patient/family education   PT Goals (Current goals can be found in the Care Plan section)  Acute Rehab PT Goals Patient Stated Goal: to get back home PT Goal Formulation: With patient Time For Goal Achievement: 07/26/14 Potential to Achieve Goals: Good    Frequency Min 3X/week   Barriers to discharge        Co-evaluation               End of Session Equipment Utilized During Treatment: Gait  belt Activity Tolerance: Patient tolerated treatment well Patient left: in bed;with call bell/phone within reach;with bed alarm set Nurse Communication: Mobility status    Functional Assessment Tool Used: clinical judgement Functional Limitation: Mobility: Walking and moving around Mobility: Walking and Moving Around Current Status (N8295): At least 1 percent but less than 20 percent impaired, limited or restricted Mobility: Walking and Moving Around Goal Status [phone]): 0 percent impaired, limited or restricted    Time: 0852-0907 PT Time Calculation (min) (ACUTE ONLY): 15 min   Charges:   PT Evaluation $Initial PT Evaluation Tier I: 1 Procedure     PT G Codes:   PT G-Codes **NOT FOR INPATIENT CLASS** Functional Assessment Tool Used: clinical judgement Functional Limitation: Mobility: Walking and moving around Mobility: Walking and Moving Around Current Status (Q6578): At least 1 percent but less than 20 percent impaired, limited or restricted Mobility: Walking and Moving Around Goal Status [phone]): 0 percent impaired, limited or restricted    Claretha Cooper 07/12/2014, 9:18 AM Tresa Endo PT [phone]

## 2014-07-12 NOTE — H&P (Signed)
[facility] History and Physical  Patient: Michelle [name]  MRN: [id number]  DOB: August 29, 1930  DOS: the patient was seen and examined on 07/11/2014 PCP: Donnajean Lopes, MD  Referring physician: Dr. Mingo Amber Chief Complaint: Dizziness  HPI: Michelle [name] is a 81 y.o. female with Past medical history of hypertension and arthritis. The patient is presenting with complaints of dizziness and lightheadedness that is present since she has woken up this morning. Patient mentions she has been having difficulty walking in a straight line at home and has been bumping into things. She denies any blurred vision or double vision. Denies any speech difficulty. Denies any vertigo. Denies any choking episodes. She mentions that while she was walking outside and talking with one of her neighbor she suddenly felt that she is up and I'll pass out and fell but did not completely passed out or hit her head or neck. She denies any chest pain or chest tightness. Denies any shortness of breath. Denies any cough or fever or chills. Denies any diarrhea or abdominal pain. Denies any burning urination. Denies any new tingling and she mentions she has chronic tingling but denies any numbness. Denies any focal weakness. Denies any similar episode in the past.  The patient is coming from home. And at her baseline independent for most of her ADL.  Review of Systems: as mentioned in the history of present illness.  A comprehensive review of the other systems is negative.  Past Medical History  Diagnosis Date  . Hypertension   . Arthritis    Past Surgical History  Procedure Laterality Date  . Shoulder surgery      Right   Social History:  reports that she has never smoked. She does not have any smokeless tobacco history on file. She reports that she does not drink alcohol or use illicit drugs.  No Known Allergies  Family History  Problem Relation Age of Onset  . Adopted: Yes    Prior to Admission  medications   Medication Sig Start Date End Date Taking? Authorizing Provider  aspirin 325 MG tablet Take 325 mg by mouth 2 (two) times daily.   Yes Historical Provider, MD  Calcium Carbonate-Vit D-Min (CALCIUM 600 + MINERALS) 600-200 MG-UNIT TABS Take 1-2 tablets by mouth daily. Take 2 tablets (1200 mg) in the am and Take 1 tablet (600 mg) at the pm.   Yes Historical Provider, MD  esomeprazole (NEXIUM) 20 MG capsule Take 20 mg by mouth daily as needed (acid reflux).   Yes Historical Provider, MD  gabapentin (NEURONTIN) 300 MG capsule Take 300 mg by mouth 3 (three) times daily.   Yes Historical Provider, MD  losartan-hydrochlorothiazide (HYZAAR) 100-12.5 MG per tablet Take 1 tablet by mouth daily.   Yes Historical Provider, MD  magnesium oxide (MAG-OX) 400 MG tablet Take 400 mg by mouth daily.   Yes Historical Provider, MD  pravastatin (PRAVACHOL) 20 MG tablet Take 20 mg by mouth daily.   Yes Historical Provider, MD  vitamin B-12 (CYANOCOBALAMIN) 1000 MCG tablet Take 1,000 mcg by mouth daily.   Yes Historical Provider, MD  omeprazole (PRILOSEC) 20 MG capsule Take 1 capsule (20 mg total) by mouth daily. Patient not taking: Reported on 07/11/2014 07/05/12   Leonard Schwartz, MD    Physical Exam: Filed Vitals:   07/11/14 2300 07/11/14 2315 07/12/14 0206 07/12/14 0500  BP: 150/73  128/55   Pulse: 66  70   Temp: 98.9 F (37.2 C)  97.9 F (36.6 C)  TempSrc: Oral  Oral   Resp: 18  18   Height:  5\' 2"  (1.575 m)    Weight:  71.2 kg (156 lb 15.5 oz)  71 kg (156 lb 8.4 oz)  SpO2: 95%  95%     General: Alert, Awake and Oriented to Time, Place and Person. Appear in mild distress Noncompliant incorporating the exam secondary to her arthritis and pain Eyes: PERRL ENT: Oral Mucosa clear moist. Neck: no JVD Cardiovascular: S1 and S2 Present, aortic systolic Murmur, Peripheral Pulses Present Respiratory: Bilateral Air entry equal and Decreased,  Clear to Auscultation, no Crackles, no wheezes Abdomen:  Bowel Sound present, Soft and non tender Skin: no Rash Extremities: Bilateral Pedal edema, no calf tenderness Neurologic: Grossly no focal neuro deficit.  Labs on Admission:  CBC:  Recent Labs Lab 07/11/14 1846  WBC 9.9  HGB 12.1  HCT 37.7  MCV 86.5  PLT 283    CMP     Component Value Date/Time   NA 141 07/11/2014 1846   K 4.1 07/11/2014 1846   CL 105 07/11/2014 1846   CO2 25 07/11/2014 1846   GLUCOSE 98 07/11/2014 1846   BUN 23* 07/11/2014 1846   CREATININE 1.04* 07/11/2014 1846   CALCIUM 9.5 07/11/2014 1846   PROT 6.9 07/05/2012 1510   ALBUMIN 4.0 07/05/2012 1510   AST 20 07/05/2012 1510   ALT 11 07/05/2012 1510   ALKPHOS 54 07/05/2012 1510   BILITOT 0.4 07/05/2012 1510   GFRNONAA 49* 07/11/2014 1846   GFRAA 57* 07/11/2014 1846    No results for input(s): LIPASE, AMYLASE in the last 168 hours.  No results for input(s): CKTOTAL, CKMB, CKMBINDEX, TROPONINI in the last 168 hours. BNP (last 3 results) No results for input(s): BNP in the last 8760 hours.  ProBNP (last 3 results) No results for input(s): PROBNP in the last 8760 hours.   Radiological Exams on Admission: Ct Head Wo Contrast  07/12/2014   CLINICAL DATA:  Dizziness.  EXAM: CT HEAD WITHOUT CONTRAST  TECHNIQUE: Contiguous axial images were obtained from the base of the skull through the vertex without intravenous contrast.  COMPARISON:  None.  FINDINGS: Skull and Sinuses:Negative for fracture or destructive process. The mastoids, middle ears, and imaged paranasal sinuses are clear.  Orbits: No acute abnormality.  Brain: Small low-density in the left cerebellum compatible with small vessel infarct, age-indeterminate. No major vessel infarct is noted. Chronic small vessel disease also noted in the cerebral hemispheres with predominately periventricular ischemic gliosis. No hemorrhage, hydrocephalus, or mass lesion. There is senescent calcification in the globus pallidus and right dentate nuclei.  IMPRESSION: 1.  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 2. Mild cerebral small-vessel disease.   Electronically Signed   By: Monte Fantasia M.D.   On: 07/12/2014 01:22   EKG: Independently reviewed. normal sinus rhythm, nonspecific ST and T waves changes.  Assessment/Plan Principal Problem:   Near syncope Active Problems:   PULMONARY FIBROSIS   GERD   Hypertension   Arthritis   1. Near syncope The patient is presenting with numbness of near syncope. Along with a near syncope she also has dizziness that is present throughout the day. She does not have any symptoms at present and her neurological examination is clear at present. She also is not orthostatic. I will get a CAT scan of her head to rule out any acute abnormality. Continue her home blood pressure medications since that does not have  any orthostasis. She mentions that she has a history of tachybradycardia arrhythmia in the past and I will monitor her on telemetry. She also has an aortic systolic murmur for which I will get an echocardiogram in the morning.  2. Peripheral neuropathy as well as history of arthritis. This could also contribute to her difficulty ambulation. We get PTOT evaluation in the morning.  3. Dyslipidemia. Continue Pravachol.  Advance goals of care discussion: Full code as per my discussion with patient   DVT Prophylaxis: subcutaneous Heparin Nutrition: Advance diet as tolerated cardiac  Family Communication: family was present at bedside, opportunity was given to ask question and all questions were answered satisfactorily at the time of interview. Disposition: Admitted as observation, telemetry unit.  Author: Berle Mull, MD [facility] Pager: [phone]  Addendum: The patient is presenting with dizziness. CAT scan a showing small cerebellar infarct age indeterminate. Briefly discussed with neurology. Recommended an MRI of the brain to  rule out acute infarct. Recommended to call for formal consult if the MRI is positive for any acute infarct and currently continue with current management.  Michelle [name] 6:02 AM 07/12/2014    If 7PM-7AM, please contact night-coverage [email or web address] Password [email or web address] .

## 2014-07-12 NOTE — Progress Notes (Signed)
  Echocardiogram 2D Echocardiogram has been performed.  Michelle [name] 07/12/2014, 9:56 AM

## 2014-07-13 DIAGNOSIS — K219 Gastro-esophageal reflux disease without esophagitis: Secondary | ICD-10-CM | POA: Diagnosis not present

## 2014-07-13 DIAGNOSIS — Z7982 Long term (current) use of aspirin: Secondary | ICD-10-CM | POA: Diagnosis not present

## 2014-07-13 DIAGNOSIS — I350 Nonrheumatic aortic (valve) stenosis: Secondary | ICD-10-CM | POA: Diagnosis not present

## 2014-07-13 DIAGNOSIS — J8410 Pulmonary fibrosis, unspecified: Secondary | ICD-10-CM | POA: Diagnosis not present

## 2014-07-13 DIAGNOSIS — R55 Syncope and collapse: Secondary | ICD-10-CM | POA: Diagnosis not present

## 2014-07-13 DIAGNOSIS — E785 Hyperlipidemia, unspecified: Secondary | ICD-10-CM | POA: Diagnosis not present

## 2014-07-13 DIAGNOSIS — G629 Polyneuropathy, unspecified: Secondary | ICD-10-CM | POA: Diagnosis not present

## 2014-07-13 DIAGNOSIS — I10 Essential (primary) hypertension: Secondary | ICD-10-CM | POA: Diagnosis not present

## 2014-07-13 DIAGNOSIS — M1990 Unspecified osteoarthritis, unspecified site: Secondary | ICD-10-CM | POA: Diagnosis not present

## 2014-07-13 LAB — BASIC METABOLIC PANEL
ANION GAP: 8 (ref 5–15)
BUN: 20 mg/dL (ref 6–20)
CO2: 27 mmol/L (ref 22–32)
CREATININE: 0.86 mg/dL (ref 0.44–1.00)
Calcium: 9.4 mg/dL (ref 8.9–10.3)
Chloride: 105 mmol/L (ref 101–111)
GFR calc Af Amer: >60 mL/min (ref >60)
GFR calc non Af Amer: >60 mL/min (ref >60)
GLUCOSE: 97 mg/dL (ref 65–99)
POTASSIUM: 3.8 mmol/L (ref 3.5–5.1)
SODIUM: 140 mmol/L (ref 135–145)

## 2014-07-13 LAB — CBC
HCT: 36 % (ref 36.0–46.0)
Hemoglobin: 11.5 g/dL — ABNORMAL LOW (ref 12.0–15.0)
MCH: 27.2 pg (ref 26.0–34.0)
MCHC: 31.9 g/dL (ref 30.0–36.0)
MCV: 85.1 fL (ref 78.0–100.0)
Platelets: 252 10*3/uL (ref 150–400)
RBC: 4.23 MIL/uL (ref 3.87–5.11)
RDW: 14.3 % (ref 11.5–15.5)
WBC: 6.8 10*3/uL (ref 4.0–10.5)

## 2014-07-13 LAB — GLUCOSE, CAPILLARY: Glucose-Capillary: 104 mg/dL — ABNORMAL HIGH (ref 65–99)

## 2014-07-13 MED ORDER — GI COCKTAIL ~~LOC~~
30.0000 mL | Freq: Three times a day (TID) | ORAL | Status: DC | PRN
  Administered 2014-07-13: 30 mL via ORAL
  Filled 2014-07-13 (×2): qty 30

## 2014-07-13 NOTE — Discharge Instructions (Signed)

## 2014-07-13 NOTE — Evaluation (Signed)
Occupational Therapy Evaluation Patient Details Name: Michelle [name] MRN: [id number] DOB: 15-May-1930 Today's Date: 07/13/2014    History of Present Illness The patient is presenting 07/11/14 with complaints of dizziness and lightheadedness that is present since she has woken up this morning. Patient mentions she has been having difficulty walking in a straight line at home and has been bumping into things   Clinical Impression   PT admitted with dizziness from home. Pt currently with functional limitiations due to the deficits listed below (see OT problem list). PTA independent and driving. Pt demonstrates decr awareness to balance deficits and fall risk. Pt declines DME and limited transfers this session. Pt reports "i wont know if I am back to my complete baseline until I walk in the hall" but then patient declines further evaluation. Pt also inconsistent in reporting assistance upon d/c. Pt reports "i am not answering any questions except to god".  Pt will benefit from skilled OT to increase their independence and safety with adls and balance to allow discharge [place], 3n1, RW. Pt will need education on DME and likely to refuse based on this session.      Follow Up Recommendations  Home health OT    Equipment Recommendations  3 in 1 bedside comode;Other (comment) (rw)    Recommendations for Other Services       Precautions / Restrictions Precautions Precautions: Fall Restrictions Weight Bearing Restrictions: No      Mobility Bed Mobility Overal bed mobility: Independent                Transfers Overall transfer level: Needs assistance Equipment used: None Transfers: Sit to/from Stand Sit to Stand: Min guard         General transfer comment: cues for safety    Balance                                            ADL Overall ADL's : At baseline;Needs assistance/impaired (per pt baseline)     Grooming: Wash/dry hands;Min guard;Cueing for  safety;Standing Grooming Details (indicate cue type and reason): pt grabbing at door frames and reaching out for next place that could be a place to grab. Pt again declines any assistance or dme. Pt reports no dizziness.                 Toilet Transfer: Min Statistician Details (indicate cue type and reason): pt grabbing for environmental supports , reaching guarded. Pt declines DME . Pt states"Oh no I can't be using no cane maybe a walker but no cane" Toileting- Clothing Manipulation and Hygiene: Sit to/from stand;Min guard         General ADL Comments: Pt completed bed mobility, don house shoes, toilet transfer and sink level grooming. No family present to determine baseline. Pt states she has no family initially but then gives great detail about extended family and a neighbor that helps. Pt drives and does all home care independent. Pt demonstrates decr gait with ambulation to bathroom and when questioned states "i am answering only to one person and that is god"      Vision Vision Assessment?: No apparent visual deficits   Perception     Praxis      Pertinent Vitals/Pain Pain Assessment: No/denies pain     Hand Dominance Right   Extremity/Trunk Assessment Upper Extremity Assessment Upper Extremity Assessment: Overall  WFL for tasks assessed   Lower Extremity Assessment Lower Extremity Assessment: Defer to PT evaluation   Cervical / Trunk Assessment Cervical / Trunk Assessment: Kyphotic   Communication Communication Communication: No difficulties   Cognition Arousal/Alertness: Awake/alert Behavior During Therapy: WFL for tasks assessed/performed Overall Cognitive Status: Impaired/Different from baseline Area of Impairment: Awareness           Awareness: Anticipatory   General Comments: pt demonstrates decr awareness to fall risk   General Comments       Exercises       Shoulder Instructions      Home Living Family/patient  expects to be discharged to:: Private residence Living Arrangements: Alone Available Help at Discharge: Family;Friend(s);Available PRN/intermittently Type of Home: House Home Access: Stairs to enter [employer] of Steps: 3 Entrance Stairs-Rails: Left;Right Home Layout: One level     Bathroom Shower/Tub: Teacher, early years/pre: Standard     Home Equipment: None   Additional Comments: pt still drives      Prior Functioning/Environment Level of Independence: Independent             OT Diagnosis: Generalized weakness   OT Problem List: Decreased strength;Decreased activity tolerance;Impaired balance (sitting and/or standing);Decreased cognition;Decreased safety awareness;Decreased knowledge of use of DME or AE;Decreased knowledge of precautions   OT Treatment/Interventions: Self-care/ADL training;Therapeutic exercise;DME and/or AE instruction;Therapeutic activities;Cognitive remediation/compensation;Patient/family education;Balance training    OT Goals(Current goals can be found in the care plan section) Acute Rehab OT Goals Patient Stated Goal: to get back home OT Goal Formulation: With patient Time For Goal Achievement: 07/27/14 Potential to Achieve Goals: Good  OT Frequency: Min 2X/week   Barriers to D/C: Decreased caregiver support  lives alone       Co-evaluation              End of Session Nurse Communication: Mobility status;Precautions  Activity Tolerance: Patient tolerated treatment well Patient left: in bed;with call bell/phone within reach   Time: 1101-1115 OT Time Calculation (min): 14 min Charges:  OT General Charges $OT Visit: 1 Procedure OT Evaluation $Initial OT Evaluation Tier I: 1 Procedure G-Codes: OT G-codes **NOT FOR INPATIENT CLASS** Functional Assessment Tool Used: clinical judgement Functional Limitation: Self care Self Care Current Status (X2119): At least 1 percent but less than 20 percent impaired,  limited or restricted Self Care Goal Status (E1740): At least 1 percent but less than 20 percent impaired, limited or restricted  Peri Maris 07/13/2014, 11:25 AM  Pager: [phone]

## 2014-07-13 NOTE — Discharge Summary (Signed)
Physician Discharge Summary  Michelle [name] ATF:[id number] DOB: [birth year] DOA: 07/11/2014  PCP: Donnajean Lopes, MD  Admit date: 07/11/2014 Discharge date: 07/13/2014  Recommendations for Outpatient Follow-up:  1. Pt will need to follow up with PCP in 2-3 weeks post discharge 2. Please obtain BMP to evaluate electrolytes and kidney function  Discharge Diagnoses:  Principal Problem:   Near syncope Active Problems:   PULMONARY FIBROSIS   GERD   Hypertension   Arthritis  Discharge Condition: Stable  Diet recommendation: Heart healthy diet discussed in details   Brief narrative:    Pt is 81 yo female who presented to [facility] ED with main concern of sudden onset of lightheadedness and dizziness, unsteady gait and near syncopethat occurred several hours prior to this admission. [facility] admitted for evaluation of stroke.   Assessment/Plan:    Principal Problem: Dizziness adn lightheadedness - MRI brain with no acute infarction but chronic left cerebellar stroke is noted - 2D ECHO with normal EF and no signs of an embolic source - PT evaluation requested, HH PT recommended  - risk factors control as possible  Active Problems:  HTN - reasonable inpatient control - continue home medical regimen   PULMONARY FIBROSIS - respiratory status is stable - pt is maintaining oxygen saturation at target range   Acute renal failure - pre renal in etiology - now resolved - repeat again BMP in AM  DVT prophylaxis - Heparin SQ while hospitalized   Code Status: Full.  Family Communication: plan of care discussed with the patient Disposition Plan: Home 5/30  IV access:  Peripheral IV  Procedures and diagnostic studies:   Ct Head Wo Contrast 07/12/2014 Small vessel infarct in the left cerebellum is notable due to the history and is age-indeterminate. 2. Mild cerebral small-vessel disease.   Mr Brain Wo Contrast 07/12/2014 Chronic LEFT cerebellar infarct. No acute  stroke or posterior fossa mass. No evidence for diminished flow void signal in the vertebral arteries or basilar artery. Mild atrophy with moderately advanced chronic microvascular ischemic change.   Medical Consultants:  None  Other Consultants:  PT/OT  IAnti-Infectives:   None      Discharge Exam: Filed Vitals:   07/13/14 0633  BP: 107/65  Pulse: 73  Temp: 97.7 F (36.5 C)  Resp: 20   Filed Vitals:   07/12/14 0500 07/12/14 1422 07/12/14 2032 07/13/14 0633  BP:  139/61 111/61 107/65  Pulse:  70 74 73  Temp:  98.4 F (36.9 C) 98 F (36.7 C) 97.7 F (36.5 C)  TempSrc:   Oral Oral  Resp:  18 20 20   Height:      Weight: 71 kg (156 lb 8.4 oz)   70.7 kg (155 lb 13.8 oz)  SpO2:  99% 95% 96%    General: Pt is alert, follows commands appropriately, not in acute distress Cardiovascular: Regular rate and rhythm, no rubs, no gallops Respiratory: Clear to auscultation bilaterally, no wheezing, no crackles, no rhonchi Abdominal: Soft, non tender, non distended, bowel sounds +, no guarding Extremities: no edema, no cyanosis, pulses palpable bilaterally DP and PT Neuro: Grossly nonfocal  Discharge Instructions     Medication List    TAKE these medications        aspirin 325 MG tablet  Take 325 mg by mouth 2 (two) times daily.     CALCIUM 600 + MINERALS 600-200 MG-UNIT Tabs  Take 1-2 tablets by mouth daily. Take 2 tablets (1200 mg) in the am and Take 1 tablet (600  mg) at the pm.     esomeprazole 20 MG capsule  Commonly known as:  NEXIUM  Take 20 mg by mouth daily as needed (acid reflux).     gabapentin 300 MG capsule  Commonly known as:  NEURONTIN  Take 300 mg by mouth 3 (three) times daily.     losartan-hydrochlorothiazide 100-12.5 MG per tablet  Commonly known as:  HYZAAR  Take 1 tablet by mouth daily.     magnesium oxide 400 MG tablet  Commonly known as:  MAG-OX  Take 400 mg by mouth daily.     omeprazole 20 MG capsule  Commonly known as:   PRILOSEC  Take 1 capsule (20 mg total) by mouth daily.     pravastatin 20 MG tablet  Commonly known as:  PRAVACHOL  Take 20 mg by mouth daily.     vitamin B-12 1000 MCG tablet  Commonly known as:  CYANOCOBALAMIN  Take 1,000 mcg by mouth daily.           Follow-up Information    Follow up with Donnajean Lopes, MD.   Specialty:  Internal Medicine   Contact information:   [address] [place] [address] [phone]        The results of significant diagnostics from this hospitalization (including imaging, microbiology, ancillary and laboratory) are listed below for reference.     Microbiology: No results found for this or any previous visit (from the past 240 hour(s)).   Labs: Basic Metabolic Panel:  Recent Labs Lab 07/11/14 1846 07/12/14 0645 07/13/14 0552  NA 141 138 140  K 4.1 3.7 3.8  CL 105 104 105  CO2 25 24 27   GLUCOSE 98 98 97  BUN 23* 25* 20  CREATININE 1.04* 0.86 0.86  CALCIUM 9.5 8.7* 9.4   Liver Function Tests:  Recent Labs Lab 07/12/14 0645  AST 19  ALT 14  ALKPHOS 57  BILITOT 0.9  PROT 6.4*  ALBUMIN 3.6   CBC:  Recent Labs Lab 07/11/14 1846 07/12/14 0645 07/13/14 0552  WBC 9.9 7.1 6.8  NEUTROABS  --  4.1  --   HGB 12.1 10.7* 11.5*  HCT 37.7 33.9* 36.0  MCV 86.5 86.5 85.1  PLT 283 249 252    CBG:  Recent Labs Lab 07/12/14 0728 07/13/14 0637  GLUCAP 82 104*     SIGNED: Time coordinating discharge: 30 minutes  Faye Ramsay, MD  [facility] 07/13/2014, 11:35 AM Pager [phone]  If 7PM-7AM, please contact night-coverage [email or web address] Password [email or web address]

## 2014-07-14 DIAGNOSIS — R55 Syncope and collapse: Secondary | ICD-10-CM | POA: Diagnosis not present

## 2014-07-14 DIAGNOSIS — G629 Polyneuropathy, unspecified: Secondary | ICD-10-CM | POA: Diagnosis not present

## 2014-07-14 DIAGNOSIS — I350 Nonrheumatic aortic (valve) stenosis: Secondary | ICD-10-CM | POA: Diagnosis not present

## 2014-07-14 DIAGNOSIS — I10 Essential (primary) hypertension: Secondary | ICD-10-CM | POA: Diagnosis not present

## 2014-07-14 DIAGNOSIS — E785 Hyperlipidemia, unspecified: Secondary | ICD-10-CM | POA: Diagnosis not present

## 2014-07-14 DIAGNOSIS — K219 Gastro-esophageal reflux disease without esophagitis: Secondary | ICD-10-CM | POA: Diagnosis not present

## 2014-07-14 DIAGNOSIS — J8410 Pulmonary fibrosis, unspecified: Secondary | ICD-10-CM | POA: Diagnosis not present

## 2014-07-14 DIAGNOSIS — M1990 Unspecified osteoarthritis, unspecified site: Secondary | ICD-10-CM | POA: Diagnosis not present

## 2014-07-14 DIAGNOSIS — Z7982 Long term (current) use of aspirin: Secondary | ICD-10-CM | POA: Diagnosis not present

## 2014-07-14 LAB — CBC
HEMATOCRIT: 36.1 % (ref 36.0–46.0)
Hemoglobin: 11.6 g/dL — ABNORMAL LOW (ref 12.0–15.0)
MCH: 27.5 pg (ref 26.0–34.0)
MCHC: 32.1 g/dL (ref 30.0–36.0)
MCV: 85.5 fL (ref 78.0–100.0)
Platelets: 260 10*3/uL (ref 150–400)
RBC: 4.22 MIL/uL (ref 3.87–5.11)
RDW: 14.4 % (ref 11.5–15.5)
WBC: 7.7 10*3/uL (ref 4.0–10.5)

## 2014-07-14 LAB — BASIC METABOLIC PANEL
Anion gap: 11 (ref 5–15)
BUN: 24 mg/dL — ABNORMAL HIGH (ref 6–20)
CO2: 26 mmol/L (ref 22–32)
Calcium: 9.5 mg/dL (ref 8.9–10.3)
Chloride: 105 mmol/L (ref 101–111)
Creatinine, Ser: 0.94 mg/dL (ref 0.44–1.00)
GFR calc Af Amer: >60 mL/min (ref >60)
GFR calc non Af Amer: 55 mL/min — ABNORMAL LOW (ref >60)
GLUCOSE: 98 mg/dL (ref 65–99)
Potassium: 3.7 mmol/L (ref 3.5–5.1)
SODIUM: 142 mmol/L (ref 135–145)

## 2014-07-14 LAB — GLUCOSE, CAPILLARY: Glucose-Capillary: 90 mg/dL (ref 65–99)

## 2014-07-14 LAB — TROPONIN I

## 2014-07-14 NOTE — Progress Notes (Signed)
Occupational Therapy Treatment Patient Details Name: Michelle [name] MRN: [id number] DOB: [birth year] Today's Date: 07/14/2014    History of present illness The patient is presenting 07/11/14 with complaints of dizziness and lightheadedness that is present since she has woken up this morning. Patient mentions she has been having difficulty walking in a straight line at home and has been bumping into things   OT comments  Pt will benefit from A from son initially and HHOT to ensure safety  Follow Up Recommendations  Home health OT    Equipment Recommendations  3 in 1 bedside comode;Other (comment) (rw)    Recommendations for Other Services      Precautions / Restrictions Precautions Precautions: Fall Restrictions Weight Bearing Restrictions: No       Mobility Bed Mobility Overal bed mobility: Independent                Transfers Overall transfer level: Needs assistance Equipment used: None   Sit to Stand: Min guard         General transfer comment: cues for safety    Balance                                   ADL Overall ADL's : Needs assistance/impaired     Grooming: Cueing for safety;Standing;Wash/dry face;Supervision/safety                   Toilet Transfer: Stand-pivot;Regular Energy manager and Hygiene: Supervision/safety;Sit to/from stand         General ADL Comments: pt states son will A  a little but he works.  Pt will benefit from [facility] to adress safety in home                Cognition   Behavior During Therapy: [facility] for tasks assessed/performed Overall Cognitive Status: Within Functional Limits for tasks assessed                                    Pertinent Vitals/ Pain       Pain Assessment: No/denies pain     Prior Functioning/Environment              Frequency Min 2X/week     Progress Toward Goals  OT Goals(current goals can now be found in  the care plan section)  Progress towards OT goals: Progressing toward goals  Acute Rehab OT Goals Patient Stated Goal: to get back home OT Goal Formulation: With patient  Plan      Co-evaluation                 End of Session     Activity Tolerance Patient tolerated treatment well   Patient Left in bed;with call bell/phone within reach   Nurse Communication Mobility status;Precautions    Functional Assessment Tool Used: clinical judgement Functional Limitation: Self care Self Care Current Status (W4665): At least 1 percent but less than 20 percent impaired, limited or restricted Self Care Goal Status (L9357): At least 1 percent but less than 20 percent impaired, limited or restricted   Time: 1035-1100 OT Time Calculation (min): 25 min  Charges: OT G-codes **NOT FOR INPATIENT CLASS** Functional Assessment Tool Used: clinical judgement Functional Limitation: Self care Self Care Current Status (S1779): At least 1 percent but less than 20 percent impaired, limited or restricted  Self Care Goal Status [phone]): At least 1 percent but less than 20 percent impaired, limited or restricted OT General Charges $OT Visit: 1 Procedure OT Treatments $Self Care/Home Management : 23-37 mins  Hershy Flenner, Thereasa Parkin 07/14/2014, 11:08 AM

## 2014-07-14 NOTE — Consult Note (Addendum)
CARDIOLOGY CONSULT NOTE   Patient ID: Michelle [name] MRN: [id number], DOB/AGE: 1930-06-15   Admit date: 07/11/2014 Date of Consult: 07/14/2014   Primary Physician: Donnajean Lopes, MD Primary Cardiologist: None  Pt. Profile  81 year old woman admitted with dizziness.  Problem List  Past Medical History  Diagnosis Date  . Hypertension   . Arthritis     Past Surgical History  Procedure Laterality Date  . Shoulder surgery      Right     Allergies  No Known Allergies  HPI   This 81 year old woman was admitted because of dizziness.  Her symptoms started on the day of admission which was 07/11/14.  She also had a recent episode of near-syncope prior to admission.  She has ruled out for a myocardial infarction.  Her neurologic workup shows evidence of an old cerebellar infarct but no recent infarct.  She denies any chest pain or increased dyspnea. She has a past history of hypertension and has been on losartan HCT at home.  The pressures in the hospital have been stable and she has not had any evidence of orthostatic hypotension.  Her presenting symptoms have cleared.  She was able to successfully walk in the hall today.  Inpatient Medications  . aspirin  325 mg Oral Daily  . calcium-vitamin D  1 tablet Oral Q breakfast  . gabapentin  300 mg Oral TID  . heparin  5,000 Units Subcutaneous 3 times per day  . hydrochlorothiazide  12.5 mg Oral Daily  . losartan  100 mg Oral Daily  . pantoprazole  40 mg Oral Daily  . pravastatin  20 mg Oral q1800  . sodium chloride  3 mL Intravenous Q12H  . vitamin B-12  1,000 mcg Oral Daily    Family History Family History  Problem Relation Age of Onset  . Adopted: Yes     Social History History   Social History  . Marital Status: Widowed    Spouse Name: N/A  . Number of Children: N/A  . Years of Education: N/A   Occupational History  . Not on file.   Social History Main Topics  . Smoking status: Never Smoker   . Smokeless  tobacco: Not on file  . Alcohol Use: No  . Drug Use: No  . Sexual Activity: Not on file   Other Topics Concern  . Not on file   Social History Narrative     Review of Systems  General:  No chills, fever, night sweats or weight changes.  Cardiovascular:  No chest pain, dyspnea on exertion, edema, orthopnea, palpitations, paroxysmal nocturnal dyspnea. Dermatological: No rash, lesions/masses Respiratory: No cough, dyspnea Urologic: No hematuria, dysuria Abdominal:   No nausea, vomiting, diarrhea, bright red blood per rectum, melena, or hematemesis Neurologic:  No visual changes, wkns, changes in mental status. All other systems reviewed and are otherwise negative except as noted above.  Physical Exam  Blood pressure 144/71, pulse 59, temperature 97.8 F (36.6 C), temperature source Oral, resp. rate 18, height 5\' 2"  (1.575 m), weight 150 lb 2.1 oz (68.1 kg), SpO2 94 %.  General: Pleasant, NAD Psych: Normal affect. Neuro: Alert and oriented X 3. Moves all extremities spontaneously. HEENT: Normal  Neck: Supple without bruits or JVD. Lungs:  Resp regular and unlabored, CTA. Heart: RRR no s3, s4.  There is a grade 2/6 systolic ejection murmur at the upper left sternal edge and aortic area.  No diastolic murmur.  No rub. Abdomen: Soft, non-tender, non-distended, BS + x  4.  Extremities: No clubbing, cyanosis or edema. DP/PT/Radials 2+ and equal bilaterally.  Labs   Recent Labs  07/14/14 1216  TROPONINI <0.03   Lab Results  Component Value Date   WBC 7.7 07/14/2014   HGB 11.6* 07/14/2014   HCT 36.1 07/14/2014   MCV 85.5 07/14/2014   PLT 260 07/14/2014    Recent Labs Lab 07/12/14 0645  07/14/14 0415  NA 138  < > 142  K 3.7  < > 3.7  CL 104  < > 105  CO2 24  < > 26  BUN 25*  < > 24*  CREATININE 0.86  < > 0.94  CALCIUM 8.7*  < > 9.5  PROT 6.4*  --   --   BILITOT 0.9  --   --   ALKPHOS 57  --   --   ALT 14  --   --   AST 19  --   --   GLUCOSE 98  < > 98  < > =  values in this interval not displayed. Lab Results  Component Value Date   CHOL 189 07/12/2014   HDL 56 07/12/2014   LDLCALC 113* 07/12/2014   TRIG 100 07/12/2014   Lab Results  Component Value Date   DDIMER * 08/19/2007    0.51        AT THE INHOUSE ESTABLISHED CUTOFF VALUE OF 0.48 ug/mL FEU, THIS ASSAY HAS BEEN DOCUMENTED IN THE LITERATURE TO HAVE    Radiology/Studies  Ct Head Wo Contrast  07/12/2014   CLINICAL DATA:  Dizziness.  EXAM: CT HEAD WITHOUT CONTRAST  TECHNIQUE: Contiguous axial images were obtained from the base of the skull through the vertex without intravenous contrast.  COMPARISON:  None.  FINDINGS: Skull and Sinuses:Negative for fracture or destructive process. The mastoids, middle ears, and imaged paranasal sinuses are clear.  Orbits: No acute abnormality.  Brain: Small low-density in the left cerebellum compatible with small vessel infarct, age-indeterminate. No major vessel infarct is noted. Chronic small vessel disease also noted in the cerebral hemispheres with predominately periventricular ischemic gliosis. No hemorrhage, hydrocephalus, or mass lesion. There is senescent calcification in the globus pallidus and right dentate nuclei.  IMPRESSION: 1. 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 2. Mild cerebral small-vessel disease.   Electronically Signed   By: Monte Fantasia M.D.   On: 07/12/2014 01:22   Mr Brain Wo Contrast  07/12/2014   CLINICAL DATA:  Dizziness and lightheadedness since awakening 5/27. History of hypertension.  EXAM: MRI HEAD WITHOUT CONTRAST  TECHNIQUE: Multiplanar, multiecho pulse sequences of the brain and surrounding structures were obtained without intravenous contrast.  COMPARISON:  CT head 07/12/2014.  FINDINGS: No restricted diffusion throughout the brain, but specifically in the posterior fossa, to suggest acute infarction.  No acute or chronic hemorrhage, mass lesion, hydrocephalus,  or extra-axial fluid. Remote LEFT inferior cerebellar infarct. Flow voids are preserved in the carotid, basilar, and both vertebral arteries. No tonsillar herniation. No areas of chronic infarction in the posterior circulation elsewhere.  Normal for age cerebral volume. Moderately advanced T2 and FLAIR hyperintensities, likely chronic microvascular ischemic change. Empty sella. No extracranial soft tissue abnormality.  Good general agreement with recent CT.  IMPRESSION: Chronic LEFT cerebellar infarct. No acute stroke or posterior fossa mass.  No evidence for diminished flow void signal in the vertebral arteries or basilar artery.  Mild atrophy with moderately advanced chronic microvascular ischemic change.   Electronically Signed  By: Rolla Flatten M.D.   On: 07/12/2014 14:33    ECG  14-Jul-2014 11:34:18 [facility] ROUTINE RECORD Sinus rhythm with marked sinus arrhythmia Inferior infarct , age undetermined Possible Anterior infarct , age undetermined Abnormal ECG Personally reviewed.  No acute changes.  No significant change from prior EKGs.  2D Echo: 07/12/14 - Left ventricle: Technically limited study. The cavity size was normal. Wall thickness was normal. The estimated ejection fraction was 60%. Wall motion was normal; there were no regional wall motion abnormalities. - Aortic valve: Aortic valve is thickened. However, leaflets not seen well. Overall data suggests probable mild AS. - Right ventricle: Poorly visualized. The cavity size was normal. Systolic function was normal. ASSESSMENT AND PLAN  1.  Dizziness, resolved. 2.  Chronic left cerebellar infarct.  No evidence for acute infarct. 3.  Essential hypertension, adequately controlled 4.  Mild aortic stenosis  Plan: No further in-hospital cardiac tests indicated.  Okay for discharge from cardiac standpoint.  Medical follow-up with Dr. Janie Morning.  If she continues to have episodes of dizziness or near  syncope, could consider outpatient event monitor.  Telemetry during this admission has been unremarkable however   Signed, Darlin Coco MD  07/14/2014, 1:40 PM

## 2014-07-14 NOTE — Care Management Note (Signed)
Case Management Note  Patient Details  Name: Michelle [name] MRN: [id number] Date of Birth: 06/19/1930  Subjective/Objective:    81 y/o f admitted w/Near syncope. From home.                Action/Plan:[facility] rep Kristen aware of d/c & HHC orders.Patient has [place] admitting-tubed copy of insurance card. No further d/c needs.   Expected Discharge Date:   (unknown)               Expected Discharge Plan:  [place]  In-House Referral:     Discharge planning Services  CM Consult  Post Acute Care Choice:    Choice offered to:  Patient  DME Arranged:    DME Agency:     HH Arranged:    [place] Agency:  [place] ([place] aware of d/c & HHC orders, declines any dme.-3n1 recommended by OT.)  Status of Service:  Completed, signed off  Medicare Important Message Given:    Date Medicare IM Given:    Medicare IM give by:    Date Additional Medicare IM Given:    Additional Medicare Important Message give by:     If discussed at [place] of Stay Meetings, dates discussed:    Additional Comments:  Dessa Phi, RN 07/14/2014, 11:27 AM

## 2014-07-14 NOTE — Progress Notes (Signed)
Physical Therapy Treatment Patient Details Name: Michelle [name] MRN: [id number] DOB: [birth year] Today's Date: 08-08-14    History of Present Illness The patient is presenting 07/11/14 with complaints of dizziness and lightheadedness that is present since she has woken up this morning. Patient mentions she has been having difficulty walking in a straight line at home and has been bumping into things    PT Comments    Pt will benefit from HHPT;  Very cooperative with PT and wants to be able to do more   Follow Up Recommendations  Home health PT;Supervision - Intermittent     Equipment Recommendations  None recommended by PT    Recommendations for Other Services       Precautions / Restrictions Precautions Precautions: Fall Restrictions Weight Bearing Restrictions: No    Mobility  Bed Mobility Overal bed mobility: Independent                Transfers Overall transfer level: Needs assistance Equipment used: None Transfers: Sit to/from Stand Sit to Stand: Min guard;Supervision         General transfer comment: cues for safety  Ambulation/Gait Ambulation/Gait assistance: Min guard;Supervision Ambulation Distance (Feet): 40 Feet (x2) Assistive device: None Gait Pattern/deviations: Step-through pattern;Decreased step length - right;Decreased step length - left     General Gait Details: pt amb slowly, sitting rest after 40'--pt with mild c/o having chest pressure earlier (Dr. Doyle Askew aware/spoke with pt in hallway )   Stairs            Wheelchair Mobility    Modified Rankin (Stroke Patients Only)       Balance     Sitting balance-Leahy Scale: Normal       Standing balance-Leahy Scale: Fair                      Cognition Arousal/Alertness: Awake/alert Behavior During Therapy: WFL for tasks assessed/performed Overall Cognitive Status: Within Functional Limits for tasks assessed                      Exercises      General  Comments        Pertinent Vitals/Pain Pain Assessment: No/denies pain    Home Living                      Prior Function            PT Goals (current goals can now be found in the care plan section) Acute Rehab PT Goals Patient Stated Goal: to get back home PT Goal Formulation: With patient Time For Goal Achievement: 07/26/14 Potential to Achieve Goals: Good Progress towards PT goals: Progressing toward goals    Frequency  Min 3X/week    PT Plan Current plan remains appropriate    Co-evaluation             End of Session Equipment Utilized During Treatment: Gait belt Activity Tolerance: Patient tolerated treatment well Patient left: with call bell/phone within reach;in chair     Time: 3790-2409 PT Time Calculation (min) (ACUTE ONLY): 20 min  Charges:  $Gait Training: 8-22 mins                    G Codes:      Yahel Fuston 08/08/14, 12:24 PM

## 2014-07-14 NOTE — Progress Notes (Signed)
Reviewed discharge information with patient and son. Answered all questions. Patient/caregiver able to teach back medications and reasons to contact MD/911. Patient verbalizes importance of PCP follow up appointment and to schedule appoint with Dr. Philip Aspen (unable to schedule today d/t holiday ) .

## 2014-07-14 NOTE — Discharge Summary (Addendum)
Physician Discharge Summary  SIMORA DINGEE DUK:[id number] DOB: [birth year] DOA: 07/11/2014  PCP: Donnajean Lopes, MD  Admit date: 07/11/2014 Discharge date: 07/14/2014  Recommendations for Outpatient Follow-up:  1. Pt will need to follow up with PCP in 2-3 weeks post discharge 2. Please obtain BMP to evaluate electrolytes and kidney function  Discharge Diagnoses:  Principal Problem:   Near syncope Active Problems:   PULMONARY FIBROSIS   GERD   Hypertension   Arthritis  Discharge Condition: Stable  Diet recommendation: Heart healthy diet discussed in details   Brief narrative:    Pt is 81 yo female who presented to [facility] ED with main concern of sudden onset of lightheadedness and dizziness, unsteady gait and near syncopethat occurred several hours prior to this admission. [facility] admitted for evaluation of stroke.   Assessment/Plan:    Principal Problem: Dizziness adn lightheadedness - MRI brain with no acute infarction but chronic left cerebellar stroke is noted - 2D ECHO with normal EF and no signs of an embolic source - PT evaluation requested, HH PT recommended  - risk factors control as possible  Active Problems:   Chest pain  - this AM sudden onset - cardiology consulted, trop negative - no further cardiac evaluation at this point recommended - cardiology cleared pt for d/c  HTN - reasonable inpatient control - continue home medical regimen   PULMONARY FIBROSIS - respiratory status is stable - pt is maintaining oxygen saturation at target range   Acute renal failure - pre renal in etiology - now resolved - repeat again BMP in AM  DVT prophylaxis - Heparin SQ while hospitalized   Code Status: Full.  Family Communication: plan of care discussed with the patient and son at bedside  Disposition Plan: Home   IV access:  Peripheral IV  Procedures and diagnostic studies:   Ct Head Wo Contrast 07/12/2014 Small vessel infarct in the left  cerebellum is notable due to the history and is age-indeterminate. 2. Mild cerebral small-vessel disease.   Mr Brain Wo Contrast 07/12/2014 Chronic LEFT cerebellar infarct. No acute stroke or posterior fossa mass. No evidence for diminished flow void signal in the vertebral arteries or basilar artery. Mild atrophy with moderately advanced chronic microvascular ischemic change.   Medical Consultants:  None  Other Consultants:  PT/OT  IAnti-Infectives:   None      Discharge Exam: Filed Vitals:   07/14/14 0516  BP: 144/71  Pulse: 59  Temp: 97.8 F (36.6 C)  Resp: 18   Filed Vitals:   07/13/14 1425 07/13/14 2000 07/13/14 2104 07/14/14 0516  BP: 120/69  101/56 144/71  Pulse: 56  69 59  Temp: 97.7 F (36.5 C)  98 F (36.7 C) 97.8 F (36.6 C)  TempSrc: Oral  Oral Oral  Resp: 20  20 18   Height:      Weight:  68.5 kg (151 lb 0.2 oz)  68.1 kg (150 lb 2.1 oz)  SpO2: 97%  96% 94%    General: Pt is alert, follows commands appropriately, not in acute distress Cardiovascular: Regular rate and rhythm, no rubs, no gallops, SEM 3/6 in aortic and mitral area  Respiratory: Clear to auscultation bilaterally, no wheezing, no crackles, no rhonchi Abdominal: Soft, non tender, non distended, bowel sounds +, no guarding Extremities: no edema, no cyanosis, pulses palpable bilaterally DP and PT Neuro: Grossly nonfocal  Discharge Instructions      Discharge Instructions    Diet - low sodium heart healthy    Complete by:  As directed      Increase activity slowly    Complete by:  As directed             Medication List    TAKE these medications        aspirin 325 MG tablet  Take 325 mg by mouth 2 (two) times daily.     CALCIUM 600 + MINERALS 600-200 MG-UNIT Tabs  Take 1-2 tablets by mouth daily. Take 2 tablets (1200 mg) in the am and Take 1 tablet (600 mg) at the pm.     esomeprazole 20 MG capsule  Commonly known as:  NEXIUM  Take 20 mg by mouth daily as needed  (acid reflux).     gabapentin 300 MG capsule  Commonly known as:  NEURONTIN  Take 300 mg by mouth 3 (three) times daily.     losartan-hydrochlorothiazide 100-12.5 MG per tablet  Commonly known as:  HYZAAR  Take 1 tablet by mouth daily.     magnesium oxide 400 MG tablet  Commonly known as:  MAG-OX  Take 400 mg by mouth daily.     omeprazole 20 MG capsule  Commonly known as:  PRILOSEC  Take 1 capsule (20 mg total) by mouth daily.     pravastatin 20 MG tablet  Commonly known as:  PRAVACHOL  Take 20 mg by mouth daily.     vitamin B-12 1000 MCG tablet  Commonly known as:  CYANOCOBALAMIN  Take 1,000 mcg by mouth daily.       Follow-up Information    Follow up with Donnajean Lopes, MD.   Specialty:  Internal Medicine   Contact information:   [address] [place] [address] [phone]       Follow up with [facility].   Why:  HHRN/HHOT   Contact information:   [address] [place] [address] [phone]        The results of significant diagnostics from this hospitalization (including imaging, microbiology, ancillary and laboratory) are listed below for reference.     Microbiology: No results found for this or any previous visit (from the past 240 hour(s)).   Labs: Basic Metabolic Panel:  Recent Labs Lab 07/11/14 1846 07/12/14 0645 07/13/14 0552 07/14/14 0415  NA 141 138 140 142  K 4.1 3.7 3.8 3.7  CL 105 104 105 105  CO2 25 24 27 26   GLUCOSE 98 98 97 98  BUN 23* 25* 20 24*  CREATININE 1.04* 0.86 0.86 0.94  CALCIUM 9.5 8.7* 9.4 9.5   Liver Function Tests:  Recent Labs Lab 07/12/14 0645  AST 19  ALT 14  ALKPHOS 57  BILITOT 0.9  PROT 6.4*  ALBUMIN 3.6   CBC:  Recent Labs Lab 07/11/14 1846 07/12/14 0645 07/13/14 0552 07/14/14 0415  WBC 9.9 7.1 6.8 7.7  NEUTROABS  --  4.1  --   --   HGB 12.1 10.7* 11.5* 11.6*  HCT 37.7 33.9* 36.0 36.1  MCV 86.5 86.5 85.1 85.5  PLT 283 249 252 260     CBG:  Recent Labs Lab 07/12/14 0728 07/13/14 0637 07/14/14 0619  GLUCAP 82 104* 90     SIGNED: Time coordinating discharge: 30 minutes  MAGICK-Emeterio Balke, MD  [facility] 07/14/2014, 2:15 PM Pager [phone]  If 7PM-7AM, please contact night-coverage [email or web address] Password [email or web address]

## 2014-07-15 LAB — HEMOGLOBIN A1C
Hgb A1c MFr Bld: 6.4 % — ABNORMAL HIGH (ref 4.8–5.6)
MEAN PLASMA GLUCOSE: 137 mg/dL

## 2014-07-25 DIAGNOSIS — R42 Dizziness and giddiness: Secondary | ICD-10-CM | POA: Diagnosis not present

## 2014-07-25 DIAGNOSIS — R5383 Other fatigue: Secondary | ICD-10-CM | POA: Diagnosis not present

## 2014-07-25 DIAGNOSIS — I10 Essential (primary) hypertension: Secondary | ICD-10-CM | POA: Diagnosis not present

## 2014-07-25 DIAGNOSIS — E785 Hyperlipidemia, unspecified: Secondary | ICD-10-CM | POA: Diagnosis not present

## 2014-07-25 DIAGNOSIS — R0600 Dyspnea, unspecified: Secondary | ICD-10-CM | POA: Diagnosis not present

## 2014-07-25 DIAGNOSIS — R0789 Other chest pain: Secondary | ICD-10-CM | POA: Diagnosis not present

## 2014-07-25 DIAGNOSIS — R55 Syncope and collapse: Secondary | ICD-10-CM | POA: Diagnosis not present

## 2014-07-25 DIAGNOSIS — M545 Low back pain: Secondary | ICD-10-CM | POA: Diagnosis not present

## 2014-12-05 DIAGNOSIS — H25813 Combined forms of age-related cataract, bilateral: Secondary | ICD-10-CM | POA: Diagnosis not present

## 2014-12-05 DIAGNOSIS — H16223 Keratoconjunctivitis sicca, not specified as Sjogren's, bilateral: Secondary | ICD-10-CM | POA: Diagnosis not present

## 2015-01-14 DIAGNOSIS — I10 Essential (primary) hypertension: Secondary | ICD-10-CM | POA: Diagnosis not present

## 2015-01-14 DIAGNOSIS — F5104 Psychophysiologic insomnia: Secondary | ICD-10-CM | POA: Diagnosis not present

## 2015-01-14 DIAGNOSIS — B070 Plantar wart: Secondary | ICD-10-CM | POA: Diagnosis not present

## 2015-01-14 DIAGNOSIS — R5383 Other fatigue: Secondary | ICD-10-CM | POA: Diagnosis not present

## 2015-01-14 DIAGNOSIS — M545 Low back pain: Secondary | ICD-10-CM | POA: Diagnosis not present

## 2015-01-14 DIAGNOSIS — L659 Nonscarring hair loss, unspecified: Secondary | ICD-10-CM | POA: Diagnosis not present

## 2015-01-14 DIAGNOSIS — Z23 Encounter for immunization: Secondary | ICD-10-CM | POA: Diagnosis not present

## 2015-01-14 DIAGNOSIS — Z6830 Body mass index (BMI) 30.0-30.9, adult: Secondary | ICD-10-CM | POA: Diagnosis not present

## 2015-01-30 ENCOUNTER — Ambulatory Visit: Payer: Commercial Managed Care - HMO | Admitting: Podiatry

## 2015-02-23 ENCOUNTER — Ambulatory Visit: Payer: Commercial Managed Care - HMO | Admitting: Podiatry

## 2015-03-04 ENCOUNTER — Ambulatory Visit (INDEPENDENT_AMBULATORY_CARE_PROVIDER_SITE_OTHER): Payer: Commercial Managed Care - HMO

## 2015-03-04 ENCOUNTER — Ambulatory Visit (INDEPENDENT_AMBULATORY_CARE_PROVIDER_SITE_OTHER): Payer: Commercial Managed Care - HMO | Admitting: Podiatry

## 2015-03-04 ENCOUNTER — Encounter: Payer: Self-pay | Admitting: Podiatry

## 2015-03-04 VITALS — BP 150/89 | HR 62 | Resp 16

## 2015-03-04 DIAGNOSIS — M21619 Bunion of unspecified foot: Secondary | ICD-10-CM | POA: Diagnosis not present

## 2015-03-04 DIAGNOSIS — M2040 Other hammer toe(s) (acquired), unspecified foot: Secondary | ICD-10-CM

## 2015-03-04 DIAGNOSIS — L84 Corns and callosities: Secondary | ICD-10-CM | POA: Diagnosis not present

## 2015-03-04 DIAGNOSIS — M779 Enthesopathy, unspecified: Secondary | ICD-10-CM

## 2015-03-04 NOTE — Progress Notes (Signed)
Subjective:     Patient ID: Michelle [name], female   DOB: August 29, 1930, 82 y.o.   MRN: [id number]  HPI patient presents with caregiver with severe foot structural changes bilateral and severe pain underneath foot left over right   Review of Systems  All other systems reviewed and are negative.      Objective:   Physical Exam  Constitutional: She is oriented to person, place, and time.  Cardiovascular: Intact distal pulses.   Musculoskeletal: Normal range of motion.  Neurological: She is oriented to person, place, and time.  Skin: Skin is warm and dry.  Nursing note and vitals reviewed.  neurovascular status was found to be intact with muscle strength adequate and moderate depression of the arch noted. Severe forefoot derangement with large bunion deformity and prominent second metatarsal left over right with severe hammertoe deformity rigid contracture second digit left over right. Patient has good digital perfusion is well oriented 3     Assessment:     Severe foot structural changes secondary to probable rheumatoid arthritis with plantarflexed metatarsal and lesion formation left over right    Plan:     H&P x-rays reviewed with patient and discussed condition with patient and caregiver. This may require a extensive forefoot reconstruction [place] a try conservative care and today debridement was accomplished with orthotics that were scanned and we will try this first before considering any form of more aggressive treatment

## 2015-03-04 NOTE — Progress Notes (Signed)
   Subjective:    Patient ID: Michelle [name], female    DOB: [birth year], 82 y.o.   MRN: [id number]  HPI  Pt presents with bilateal bunions, hammertoe and general acheyness of feet. She also c/o bilateral callus on plantar forefoot  Review of Systems  Constitutional: Positive for fatigue.  Neurological: Positive for syncope.  All other systems reviewed and are negative.      Objective:   Physical Exam        Assessment & Plan:

## 2015-03-25 ENCOUNTER — Ambulatory Visit: Payer: Commercial Managed Care - HMO | Admitting: *Deleted

## 2015-03-25 DIAGNOSIS — M779 Enthesopathy, unspecified: Secondary | ICD-10-CM

## 2015-03-25 NOTE — Patient Instructions (Signed)

## 2015-03-25 NOTE — Progress Notes (Signed)
Patient ID: Michelle [name], female   DOB: [birth year], 82 y.o.   MRN: [id number] Patient presents for orthotic pick up.  Verbal and written break in and wear instructions given.  Patient will follow up in 4 weeks if symptoms worsen or fail to improve.

## 2015-04-02 DIAGNOSIS — L659 Nonscarring hair loss, unspecified: Secondary | ICD-10-CM | POA: Diagnosis not present

## 2015-04-30 DIAGNOSIS — I10 Essential (primary) hypertension: Secondary | ICD-10-CM | POA: Diagnosis not present

## 2015-04-30 DIAGNOSIS — R8299 Other abnormal findings in urine: Secondary | ICD-10-CM | POA: Diagnosis not present

## 2015-04-30 DIAGNOSIS — M810 Age-related osteoporosis without current pathological fracture: Secondary | ICD-10-CM | POA: Diagnosis not present

## 2015-04-30 DIAGNOSIS — E784 Other hyperlipidemia: Secondary | ICD-10-CM | POA: Diagnosis not present

## 2015-04-30 DIAGNOSIS — N390 Urinary tract infection, site not specified: Secondary | ICD-10-CM | POA: Diagnosis not present

## 2015-05-05 DIAGNOSIS — R3129 Other microscopic hematuria: Secondary | ICD-10-CM | POA: Diagnosis not present

## 2015-05-05 DIAGNOSIS — I10 Essential (primary) hypertension: Secondary | ICD-10-CM | POA: Diagnosis not present

## 2015-05-05 DIAGNOSIS — M810 Age-related osteoporosis without current pathological fracture: Secondary | ICD-10-CM | POA: Diagnosis not present

## 2015-05-05 DIAGNOSIS — E668 Other obesity: Secondary | ICD-10-CM | POA: Diagnosis not present

## 2015-05-05 DIAGNOSIS — E784 Other hyperlipidemia: Secondary | ICD-10-CM | POA: Diagnosis not present

## 2015-05-05 DIAGNOSIS — M79642 Pain in left hand: Secondary | ICD-10-CM | POA: Diagnosis not present

## 2015-05-05 DIAGNOSIS — M79641 Pain in right hand: Secondary | ICD-10-CM | POA: Diagnosis not present

## 2015-05-05 DIAGNOSIS — Z0000 Encounter for general adult medical examination without abnormal findings: Secondary | ICD-10-CM | POA: Diagnosis not present

## 2015-05-05 DIAGNOSIS — Z23 Encounter for immunization: Secondary | ICD-10-CM | POA: Diagnosis not present

## 2015-05-20 DIAGNOSIS — M810 Age-related osteoporosis without current pathological fracture: Secondary | ICD-10-CM | POA: Diagnosis not present

## 2016-11-08 DIAGNOSIS — L218 Other seborrheic dermatitis: Secondary | ICD-10-CM | POA: Diagnosis not present

## 2016-12-26 DIAGNOSIS — R3121 Asymptomatic microscopic hematuria: Secondary | ICD-10-CM | POA: Diagnosis not present

## 2016-12-26 DIAGNOSIS — R300 Dysuria: Secondary | ICD-10-CM | POA: Diagnosis not present

## 2016-12-26 DIAGNOSIS — Z23 Encounter for immunization: Secondary | ICD-10-CM | POA: Diagnosis not present

## 2016-12-26 DIAGNOSIS — I10 Essential (primary) hypertension: Secondary | ICD-10-CM | POA: Diagnosis not present

## 2016-12-26 DIAGNOSIS — N390 Urinary tract infection, site not specified: Secondary | ICD-10-CM | POA: Diagnosis not present

## 2016-12-26 DIAGNOSIS — R634 Abnormal weight loss: Secondary | ICD-10-CM | POA: Diagnosis not present

## 2016-12-26 DIAGNOSIS — Z6826 Body mass index (BMI) 26.0-26.9, adult: Secondary | ICD-10-CM | POA: Diagnosis not present

## 2017-02-20 DIAGNOSIS — R3121 Asymptomatic microscopic hematuria: Secondary | ICD-10-CM | POA: Diagnosis not present

## 2017-02-24 DIAGNOSIS — M810 Age-related osteoporosis without current pathological fracture: Secondary | ICD-10-CM | POA: Diagnosis not present

## 2017-02-24 DIAGNOSIS — I10 Essential (primary) hypertension: Secondary | ICD-10-CM | POA: Diagnosis not present

## 2017-02-24 DIAGNOSIS — E7849 Other hyperlipidemia: Secondary | ICD-10-CM | POA: Diagnosis not present

## 2017-03-03 DIAGNOSIS — E7849 Other hyperlipidemia: Secondary | ICD-10-CM | POA: Diagnosis not present

## 2017-03-03 DIAGNOSIS — R5383 Other fatigue: Secondary | ICD-10-CM | POA: Diagnosis not present

## 2017-03-03 DIAGNOSIS — Z0000 Encounter for general adult medical examination without abnormal findings: Secondary | ICD-10-CM | POA: Diagnosis not present

## 2017-03-03 DIAGNOSIS — R2681 Unsteadiness on feet: Secondary | ICD-10-CM | POA: Diagnosis not present

## 2017-03-03 DIAGNOSIS — F5104 Psychophysiologic insomnia: Secondary | ICD-10-CM | POA: Diagnosis not present

## 2017-03-03 DIAGNOSIS — R82998 Other abnormal findings in urine: Secondary | ICD-10-CM | POA: Diagnosis not present

## 2017-03-03 DIAGNOSIS — I10 Essential (primary) hypertension: Secondary | ICD-10-CM | POA: Diagnosis not present

## 2017-03-03 DIAGNOSIS — M810 Age-related osteoporosis without current pathological fracture: Secondary | ICD-10-CM | POA: Diagnosis not present

## 2017-03-03 DIAGNOSIS — M545 Low back pain: Secondary | ICD-10-CM | POA: Diagnosis not present

## 2017-03-03 DIAGNOSIS — Z1389 Encounter for screening for other disorder: Secondary | ICD-10-CM | POA: Diagnosis not present

## 2017-03-10 DIAGNOSIS — Z1212 Encounter for screening for malignant neoplasm of rectum: Secondary | ICD-10-CM | POA: Diagnosis not present

## 2017-04-03 ENCOUNTER — Other Ambulatory Visit: Payer: Self-pay | Admitting: Internal Medicine

## 2017-04-03 DIAGNOSIS — K5900 Constipation, unspecified: Secondary | ICD-10-CM | POA: Diagnosis not present

## 2017-04-03 DIAGNOSIS — R319 Hematuria, unspecified: Secondary | ICD-10-CM

## 2017-04-03 DIAGNOSIS — R300 Dysuria: Secondary | ICD-10-CM | POA: Diagnosis not present

## 2017-04-03 DIAGNOSIS — R109 Unspecified abdominal pain: Secondary | ICD-10-CM | POA: Diagnosis not present

## 2017-04-03 DIAGNOSIS — F5104 Psychophysiologic insomnia: Secondary | ICD-10-CM | POA: Diagnosis not present

## 2017-04-04 ENCOUNTER — Ambulatory Visit
Admission: RE | Admit: 2017-04-04 | Discharge: 2017-04-04 | Disposition: A | Discharge status: Home / Self Care | Payer: Medicare HMO | Source: Ambulatory Visit | Attending: Internal Medicine | Admitting: Internal Medicine

## 2017-04-04 DIAGNOSIS — R109 Unspecified abdominal pain: Secondary | ICD-10-CM

## 2017-04-04 DIAGNOSIS — R300 Dysuria: Secondary | ICD-10-CM

## 2017-04-04 DIAGNOSIS — R319 Hematuria, unspecified: Secondary | ICD-10-CM

## 2017-04-04 DIAGNOSIS — K5730 Diverticulosis of large intestine without perforation or abscess without bleeding: Secondary | ICD-10-CM | POA: Diagnosis not present

## 2017-04-27 DIAGNOSIS — R309 Painful micturition, unspecified: Secondary | ICD-10-CM | POA: Diagnosis not present

## 2017-04-27 DIAGNOSIS — R3912 Poor urinary stream: Secondary | ICD-10-CM | POA: Diagnosis not present

## 2017-04-27 DIAGNOSIS — R1032 Left lower quadrant pain: Secondary | ICD-10-CM | POA: Diagnosis not present

## 2017-05-30 DIAGNOSIS — R1032 Left lower quadrant pain: Secondary | ICD-10-CM | POA: Diagnosis not present

## 2017-05-30 DIAGNOSIS — R3912 Poor urinary stream: Secondary | ICD-10-CM | POA: Diagnosis not present

## 2017-05-30 DIAGNOSIS — R309 Painful micturition, unspecified: Secondary | ICD-10-CM | POA: Diagnosis not present

## 2017-06-20 DIAGNOSIS — R3912 Poor urinary stream: Secondary | ICD-10-CM | POA: Diagnosis not present

## 2017-06-20 DIAGNOSIS — R1032 Left lower quadrant pain: Secondary | ICD-10-CM | POA: Diagnosis not present

## 2017-06-20 DIAGNOSIS — R300 Dysuria: Secondary | ICD-10-CM | POA: Diagnosis not present

## 2017-07-07 ENCOUNTER — Other Ambulatory Visit (HOSPITAL_COMMUNITY): Payer: Self-pay | Admitting: Urology

## 2017-07-07 DIAGNOSIS — N390 Urinary tract infection, site not specified: Secondary | ICD-10-CM

## 2017-07-18 ENCOUNTER — Ambulatory Visit (HOSPITAL_COMMUNITY)
Admission: RE | Admit: 2017-07-18 | Discharge: 2017-07-18 | Disposition: A | Discharge status: Home / Self Care | Payer: Medicare HMO | Source: Ambulatory Visit | Attending: Urology | Admitting: Urology

## 2017-07-18 DIAGNOSIS — N281 Cyst of kidney, acquired: Secondary | ICD-10-CM | POA: Diagnosis not present

## 2017-07-18 DIAGNOSIS — N390 Urinary tract infection, site not specified: Secondary | ICD-10-CM | POA: Insufficient documentation

## 2017-07-18 DIAGNOSIS — N3020 Other chronic cystitis without hematuria: Secondary | ICD-10-CM | POA: Diagnosis not present

## 2017-07-18 DIAGNOSIS — R3914 Feeling of incomplete bladder emptying: Secondary | ICD-10-CM | POA: Diagnosis not present

## 2017-07-18 DIAGNOSIS — R3912 Poor urinary stream: Secondary | ICD-10-CM | POA: Diagnosis not present

## 2018-03-16 DIAGNOSIS — R21 Rash and other nonspecific skin eruption: Secondary | ICD-10-CM | POA: Diagnosis not present

## 2018-04-06 DIAGNOSIS — R21 Rash and other nonspecific skin eruption: Secondary | ICD-10-CM | POA: Diagnosis not present

## 2018-04-06 DIAGNOSIS — Z6825 Body mass index (BMI) 25.0-25.9, adult: Secondary | ICD-10-CM | POA: Diagnosis not present

## 2018-04-13 DIAGNOSIS — L281 Prurigo nodularis: Secondary | ICD-10-CM | POA: Diagnosis not present

## 2018-04-13 DIAGNOSIS — L98421 Non-pressure chronic ulcer of back limited to breakdown of skin: Secondary | ICD-10-CM | POA: Diagnosis not present

## 2018-04-13 DIAGNOSIS — L98429 Non-pressure chronic ulcer of back with unspecified severity: Secondary | ICD-10-CM | POA: Diagnosis not present

## 2018-04-13 DIAGNOSIS — R21 Rash and other nonspecific skin eruption: Secondary | ICD-10-CM | POA: Diagnosis not present

## 2018-09-28 DIAGNOSIS — M810 Age-related osteoporosis without current pathological fracture: Secondary | ICD-10-CM | POA: Diagnosis not present

## 2018-09-28 DIAGNOSIS — E7849 Other hyperlipidemia: Secondary | ICD-10-CM | POA: Diagnosis not present

## 2018-10-05 DIAGNOSIS — R7989 Other specified abnormal findings of blood chemistry: Secondary | ICD-10-CM | POA: Diagnosis not present

## 2018-10-05 DIAGNOSIS — Z0000 Encounter for general adult medical examination without abnormal findings: Secondary | ICD-10-CM | POA: Diagnosis not present

## 2018-10-05 DIAGNOSIS — E785 Hyperlipidemia, unspecified: Secondary | ICD-10-CM | POA: Diagnosis not present

## 2018-10-05 DIAGNOSIS — D649 Anemia, unspecified: Secondary | ICD-10-CM | POA: Diagnosis not present

## 2018-10-05 DIAGNOSIS — N6321 Unspecified lump in the left breast, upper outer quadrant: Secondary | ICD-10-CM | POA: Diagnosis not present

## 2018-10-05 DIAGNOSIS — M810 Age-related osteoporosis without current pathological fracture: Secondary | ICD-10-CM | POA: Diagnosis not present

## 2018-10-05 DIAGNOSIS — K5900 Constipation, unspecified: Secondary | ICD-10-CM | POA: Diagnosis not present

## 2018-10-05 DIAGNOSIS — I10 Essential (primary) hypertension: Secondary | ICD-10-CM | POA: Diagnosis not present

## 2018-10-05 DIAGNOSIS — M545 Low back pain: Secondary | ICD-10-CM | POA: Diagnosis not present

## 2018-10-05 DIAGNOSIS — R2681 Unsteadiness on feet: Secondary | ICD-10-CM | POA: Diagnosis not present

## 2018-10-10 ENCOUNTER — Other Ambulatory Visit: Payer: Self-pay | Admitting: Internal Medicine

## 2018-10-10 DIAGNOSIS — N6320 Unspecified lump in the left breast, unspecified quadrant: Secondary | ICD-10-CM

## 2018-10-17 ENCOUNTER — Other Ambulatory Visit: Payer: Medicare HMO

## 2018-10-25 DIAGNOSIS — Z23 Encounter for immunization: Secondary | ICD-10-CM | POA: Diagnosis not present

## 2018-10-25 DIAGNOSIS — R82998 Other abnormal findings in urine: Secondary | ICD-10-CM | POA: Diagnosis not present

## 2019-03-10 ENCOUNTER — Ambulatory Visit: Payer: Medicare HMO

## 2019-04-12 DIAGNOSIS — Z1331 Encounter for screening for depression: Secondary | ICD-10-CM | POA: Diagnosis not present

## 2019-04-12 DIAGNOSIS — I10 Essential (primary) hypertension: Secondary | ICD-10-CM | POA: Diagnosis not present

## 2019-04-12 DIAGNOSIS — R5383 Other fatigue: Secondary | ICD-10-CM | POA: Diagnosis not present

## 2019-04-12 DIAGNOSIS — N6321 Unspecified lump in the left breast, upper outer quadrant: Secondary | ICD-10-CM | POA: Diagnosis not present

## 2019-04-12 DIAGNOSIS — K5900 Constipation, unspecified: Secondary | ICD-10-CM | POA: Diagnosis not present

## 2019-04-12 DIAGNOSIS — R2681 Unsteadiness on feet: Secondary | ICD-10-CM | POA: Diagnosis not present

## 2019-04-16 ENCOUNTER — Other Ambulatory Visit: Payer: Self-pay | Admitting: Internal Medicine

## 2019-04-16 DIAGNOSIS — Z1231 Encounter for screening mammogram for malignant neoplasm of breast: Secondary | ICD-10-CM

## 2019-05-17 ENCOUNTER — Ambulatory Visit
Admission: RE | Admit: 2019-05-17 | Discharge: 2019-05-17 | Disposition: A | Discharge status: Home / Self Care | Payer: Medicare HMO | Source: Ambulatory Visit | Attending: Internal Medicine | Admitting: Internal Medicine

## 2019-05-17 ENCOUNTER — Other Ambulatory Visit: Payer: Self-pay

## 2019-05-17 ENCOUNTER — Other Ambulatory Visit: Payer: Self-pay | Admitting: Internal Medicine

## 2019-05-17 DIAGNOSIS — N6310 Unspecified lump in the right breast, unspecified quadrant: Secondary | ICD-10-CM

## 2019-05-17 DIAGNOSIS — Z1231 Encounter for screening mammogram for malignant neoplasm of breast: Secondary | ICD-10-CM

## 2019-05-17 DIAGNOSIS — C50411 Malignant neoplasm of upper-outer quadrant of right female breast: Secondary | ICD-10-CM | POA: Diagnosis not present

## 2019-05-17 DIAGNOSIS — C50812 Malignant neoplasm of overlapping sites of left female breast: Secondary | ICD-10-CM | POA: Diagnosis not present

## 2019-05-17 DIAGNOSIS — N6311 Unspecified lump in the right breast, upper outer quadrant: Secondary | ICD-10-CM | POA: Diagnosis not present

## 2019-05-17 DIAGNOSIS — N6320 Unspecified lump in the left breast, unspecified quadrant: Secondary | ICD-10-CM

## 2019-05-17 DIAGNOSIS — N6321 Unspecified lump in the left breast, upper outer quadrant: Secondary | ICD-10-CM | POA: Diagnosis not present

## 2019-05-17 DIAGNOSIS — C50811 Malignant neoplasm of overlapping sites of right female breast: Secondary | ICD-10-CM | POA: Diagnosis not present

## 2019-05-17 DIAGNOSIS — C773 Secondary and unspecified malignant neoplasm of axilla and upper limb lymph nodes: Secondary | ICD-10-CM | POA: Diagnosis not present

## 2019-05-27 ENCOUNTER — Telehealth: Payer: Self-pay | Admitting: *Deleted

## 2019-05-27 ENCOUNTER — Ambulatory Visit: Payer: Self-pay | Admitting: Surgery

## 2019-05-27 DIAGNOSIS — Z170 Estrogen receptor positive status [ER+]: Secondary | ICD-10-CM | POA: Diagnosis not present

## 2019-05-27 DIAGNOSIS — C50411 Malignant neoplasm of upper-outer quadrant of right female breast: Secondary | ICD-10-CM | POA: Diagnosis not present

## 2019-05-27 DIAGNOSIS — C50412 Malignant neoplasm of upper-outer quadrant of left female breast: Secondary | ICD-10-CM

## 2019-05-27 NOTE — Telephone Encounter (Signed)
Received call from Dr. Vonna Kotyk nurse at [facility] requesting appt to see medical oncology. Gave appt for 4/13 to see Dr. [name] with labs at 3:30pm and Dr. [name] at [place].

## 2019-05-27 NOTE — Progress Notes (Signed)
[place]  [phone] [phone]     ID: TIRSA GAIL DOB: 09-19-1938  MR#: [id number]  JYN#:[id number]  Patient Care Team: [name], MD as PCP - General (Internal Medicine) Dajae Kizer, Virgie Dad, MD as Consulting Physician (Oncology) Donnie Mesa, MD as Consulting Physician (General Surgery) Chauncey Cruel, MD OTHER MD:  CHIEF COMPLAINT: Bilateral estrogen receptor positive breast cancers  CURRENT TREATMENT: neoadjuvant anti-estrogens   HISTORY OF CURRENT ILLNESS: Michelle [name] herself palpated a left breast mass around January 2021, as she recalls. She underwent bilateral diagnostic mammography with tomography and bilateral breast ultrasonography at [facility] on 05/17/2019 showing: LEFT BREAST: breast density category B; 4.9 cm spiculated left breast mass extending from 12-2 o'clock, as well as to and likely involving overlying skin/nipple (with associated retraction); at least 8 abnormal left axillary lymph nodes;   RIGHT BREAST: two suspicious right breast masses, 1.2 cm at 12 o'clock and 2.5 cm at 9:30, approximately 2.6 cm apart; negative right axilla.  Accordingly that same day, she proceeded to biopsy of the bilateral breast areas in question. The pathology from this procedure ([id number]) showed:  1. Left breast, 12-2 o'clock  - invasive and in situ mammary carcinoma, grade 2, e-cadherin positive   - estrogen receptor, 100% positive with strong staining intensity and progesterone receptor, 0% negative. Proliferation marker Ki67 at 12%. HER2 negative by immunohistochemistry (1+). 4. Lymph node, left axilla  - metastatic carcinoma with extra capsular extension (1/1)  2. Right breast, 9:30  - invasive ductal carcinoma, grade 1  - morphologically dissimilar from left breast carcinoma  - estrogen receptor, 100% positive with strong staining intensity and progesterone receptor, 0% negative. Proliferation marker Ki67 at 5%.  HER2 negative by immunohistochemistry (1+). 3. Right breast, 12 o'clock  - invasive and in situ mammary carcinoma, grade 1-2   - vascular calcifications  - morphologically dissimilar from other right breast carcinoma  - estrogen receptor, 100% positive with strong staining intensity and progesterone receptor, 0% negative. Proliferation marker Ki67 at 5%. HER2 negative by immunohistochemistry (1+).   The patient's subsequent history is as detailed below.   INTERVAL HISTORY: Michelle [name] was evaluated in the breast cancer clinic on 05/28/2019 accompanied by her daughter Michelle [name] and her son Michelle [name]"). Her case also will be presented at the multidisciplinary breast cancer conference on 05/29/2019. At that time a preliminary plan will be proposed.  REVIEW OF SYSTEMS: The patient denies unusual headaches, visual changes, nausea, vomiting, stiff neck, dizziness, or gait imbalance. There has been no cough, phlegm production, or pleurisy, no chest pain or pressure, and no change in bowel or bladder habits. The patient denies fever, rash, bleeding, unexplained fatigue or unexplained weight loss.  She lives by herself, and takes care of all her activities of daily living.  A detailed review of systems was otherwise entirely negative.   PAST MEDICAL HISTORY: Past Medical History:  Diagnosis Date  . Arthritis   . Hypertension     PAST SURGICAL HISTORY: Past Surgical History:  Procedure Laterality Date  . SHOULDER SURGERY     Right  Status post total abdominal hysterectomy with bilateral salpingo-oophorectomy, status post appendectomy, status post tonsillectomy.  FAMILY HISTORY: Family History  Adopted: Yes  The patient is adopted and has no information on her parents siblings or other blood relatives   GYNECOLOGIC HISTORY:  No LMP recorded. Patient is postmenopausal. Menarche: Does not recall Age at first live birth: 86 years old [place] P 4 LMP 56s Hysterectomy?  Yes BSO?  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  Her children are Michelle [name], who lives in [place] and works for [name]) lives in [place] and runs his own business, and [name]" who lives in [place] and works for [employer]  He is also training his children to box.  Son [name] died from hepatitis C at age 25.  The patient has 11 grandchildren and more great grandchildren and great great-grandchildren than she can count.  She attends a local [place]: Not in place.  At the 05/28/2019 visit the patient was given the appropriate documents to complete and notarized at her discretion.   HEALTH MAINTENANCE: Social History   Tobacco Use  . Smoking status: Never Smoker  Substance Use Topics  . Alcohol use: No  . Drug use: No    Frequency: 3.0 times per week     Colonoscopy: Never  PAP: Remote  Bone density: Never   No Known Allergies  Current Outpatient Medications  Medication Sig Dispense Refill  . aspirin 325 MG tablet Take 325 mg by mouth 2 (two) times daily.    . Calcium Carbonate-Vit D-Min (CALCIUM 600 + MINERALS) 600-200 MG-UNIT TABS Take 1-2 tablets by mouth daily. Take 2 tablets (1200 mg) in the am and Take 1 tablet (600 mg) at the pm.    . esomeprazole (NEXIUM) 20 MG capsule Take 20 mg by mouth daily as needed (acid reflux).    . gabapentin (NEURONTIN) 300 MG capsule Take 300 mg by mouth 3 (three) times daily.    Marland Kitchen losartan-hydrochlorothiazide (HYZAAR) 100-12.5 MG per tablet Take 1 tablet by mouth daily.    . magnesium oxide (MAG-OX) 400 MG tablet Take 400 mg by mouth daily.    Marland Kitchen omeprazole (PRILOSEC) 20 MG capsule Take 1 capsule (20 mg total) by mouth daily. 30 capsule 0  . pravastatin (PRAVACHOL) 20 MG tablet Take 20 mg by mouth daily.    . vitamin B-12 (CYANOCOBALAMIN) 1000 MCG tablet Take 1,000 mcg by mouth daily.    Marland Kitchen anastrozole (ARIMIDEX) 1  MG tablet Take 1 tablet (1 mg total) by mouth daily. 90 tablet 4   No current facility-administered medications for this visit.    OBJECTIVE: African-American woman examined in a wheelchair  Vitals:   05/28/19 1548  BP: 100/62  Pulse: 75  Resp: 18  Temp: 99.1 F (37.3 C)  SpO2: 99%     Body mass index is 21.62 kg/m.   Wt Readings from Last 3 Encounters:  05/28/19 114 lb 6.4 oz (51.9 kg)  07/14/14 150 lb 2.1 oz (68.1 kg)  07/05/12 165 lb (74.8 kg)      ECOG FS:2 - Symptomatic, <50% confined to bed  Ocular: Sclerae unicteric, pupils round and equal Ear-nose-throat: Wearing a mask Lungs no rales or rhonchi Heart regular rate and rhythm Abd soft, nontender, positive bowel sounds MSK mild kyphosis and scoliosis but no focal spinal tenderness, no joint edema Neuro: non-focal, well-oriented, appropriate affect Breasts: The breasts are imaged below.  There is clear palpable adenopathy in the left axilla not in the right axilla.  Left beast 05/28/2019    Right breast, 05/28/2019    LAB RESULTS:  CMP     Component Value Date/Time   NA 143 05/28/2019 1525   K 3.3 (L) 05/28/2019 1525   CL 105 05/28/2019 1525  CO2 28 05/28/2019 1525   GLUCOSE 127 (H) 05/28/2019 1525   BUN 27 (H) 05/28/2019 1525   CREATININE 1.03 (H) 05/28/2019 1525   CALCIUM 9.5 05/28/2019 1525   PROT 7.0 05/28/2019 1525   ALBUMIN 3.9 05/28/2019 1525   AST 17 05/28/2019 1525   ALT 11 05/28/2019 1525   ALKPHOS 51 05/28/2019 1525   BILITOT 0.6 05/28/2019 1525   GFRNONAA 49 (L) 05/28/2019 1525   GFRAA 57 (L) 05/28/2019 1525    No results found for: TOTALPROTELP, ALBUMINELP, A1GS, A2GS, BETS, BETA2SER, GAMS, MSPIKE, SPEI  Lab Results  Component Value Date   WBC 6.3 05/28/2019   NEUTROABS 4.2 05/28/2019   HGB 11.1 (L) 05/28/2019   HCT 34.8 (L) 05/28/2019   MCV 88.3 05/28/2019   PLT 254 05/28/2019    No results found for: LABCA2  No components found for: OIZTIW580  No results for  input(s): INR in the last 168 hours.  No results found for: LABCA2  No results found for: DXI338  No results found for: SNK539  No results found for: JQB341  No results found for: CA2729  No components found for: HGQUANT  No results found for: CEA1 / No results found for: CEA1   No results found for: AFPTUMOR  No results found for: CHROMOGRNA  No results found for: KPAFRELGTCHN, LAMBDASER, KAPLAMBRATIO (kappa/lambda light chains)  No results found for: HGBA, HGBA2QUANT, HGBFQUANT, HGBSQUAN (Hemoglobinopathy evaluation)   No results found for: LDH  No results found for: IRON, TIBC, IRONPCTSAT (Iron and TIBC)  No results found for: FERRITIN  Urinalysis    Component Value Date/Time   COLORURINE YELLOW 07/05/2012 1610   APPEARANCEUR CLOUDY (A) 07/05/2012 1610   LABSPEC 1.028 07/05/2012 1610   PHURINE 7.0 07/05/2012 1610   GLUCOSEU NEGATIVE 07/05/2012 1610   HGBUR NEGATIVE 07/05/2012 1610   [place] 07/05/2012 1610   KETONESUR NEGATIVE 07/05/2012 1610   PROTEINUR NEGATIVE 07/05/2012 1610   UROBILINOGEN 0.2 07/05/2012 1610   NITRITE NEGATIVE 07/05/2012 1610   LEUKOCYTESUR NEGATIVE 07/05/2012 1610     STUDIES: US BREAST LTD UNI LEFT INC AXILLA  Addendum Date: 05/21/2019   ADDENDUM REPORT: 05/21/2019 09:05 ADDENDUM: RIGHT axilla was evaluated with ultrasound showing no enlarged or morphologically abnormal lymph nodes. Electronically Signed   By: Franki Cabot M.D.   On: 05/21/2019 09:05   Result Date: 05/21/2019 CLINICAL DATA:  Patient describes a palpable lump within the LEFT breast. EXAM: DIGITAL DIAGNOSTIC BILATERAL MAMMOGRAM WITH CAD AND TOMO ULTRASOUND BILATERAL BREAST COMPARISON:  None. ACR Breast Density Category b: There are scattered areas of fibroglandular density. FINDINGS: LEFT breast: There is a spiculated mass within the slightly upper outer quadrant of the LEFT breast, partially retroareolar with retraction of the skin/nipple, measuring  approximately 3.3 cm greatest dimension, with surrounding trabecular thickening raising the possibility of lymphatic involvement/inflammatory breast cancer. RIGHT breast: Spiculated mass within the upper RIGHT breast, at middle depth, 12 o'clock axis region, measuring approximately 1 cm greatest dimension. Additional spiculated mass within the outer RIGHT breast, at middle depth, 9-10 o'clock axis, measuring approximately 1.7 cm greatest dimension. There is overlying skin retraction and surrounding trabecular thickening suggesting lymphatic involvement/inflammatory breast cancer. LEFT axilla: There are numerous enlarged lymph nodes in the LEFT axilla. Mammographic images were processed with CAD. LEFT breast: Targeted ultrasound is performed, showing an irregular shadowing mass extending from the 12-2 o'clock axis, 3 cm from the nipple, measuring approximately 4.9 cm greatest dimension, extending to and likely involving the overlying skin/nipple. RIGHT  breast: Targeted ultrasound is performed, showing an irregular hypoechoic mass in the RIGHT breast at the 12 o'clock axis, 3 cm from the nipple, measuring 1.2 x 1 x 1 cm, with associated architectural distortion, corresponding to the mammographic finding. Additional irregular hypoechoic mass in the RIGHT breast at the 9:30 o'clock axis, 3 cm from the nipple, measuring 2.5 x 1.5 x 2.1 cm, corresponding to the additional mammographic finding. The 2 RIGHT breast masses are located approximately 2.6 cm apart. LEFT axilla: There are at least 8 enlarged/morphologically abnormal lymph nodes in the LEFT axilla. IMPRESSION: 1. Highly suspicious spiculated mass within the LEFT breast, extending from 12-2 o'clock axes, 3 cm from the nipple, measuring approximately 4.9 cm greatest dimension, extending to and likely involving the overlying skin/nipple with associated skin/nipple retraction. Surrounding edematous change suggests lymphangitic involvement/inflammatory breast cancer.  2. Highly suspicious mass within the RIGHT breast at the 12 o'clock axis, 3 cm from the nipple, measuring 1.2 cm. 3. Highly suspicious mass in the RIGHT breast at the 9:30 o'clock axis, 3 cm from the nipple, measuring 2.5 cm. 4. Highly suspicious enlarged/morphologically abnormal lymph nodes in the LEFT axilla (at least 8 abnormal lymph nodes), likely metastatic lymphadenopathy. RECOMMENDATION: 1. Ultrasound-guided biopsy of the LEFT breast mass, 12-2 o'clock axes, measuring approximately 4.9 cm. 2. Ultrasound-guided biopsy of the RIGHT breast mass at the 12 o'clock axis. 3. Ultrasound-guided biopsy of the RIGHT breast mass at the 9:30 o'clock axis. 4. Ultrasound-guided biopsy of 1 of the enlarged/morphologically abnormal lymph nodes in the LEFT axilla. Ultrasound-guided biopsy will be performed later today. I have discussed the findings and recommendations with the patient. If applicable, a reminder letter will be sent to the patient regarding the next appointment. BI-RADS CATEGORY  5: Highly suggestive of malignancy. Electronically Signed: By: Franki Cabot M.D. On: 05/17/2019 08:50   US BREAST LTD UNI RIGHT INC AXILLA  Addendum Date: 05/21/2019   ADDENDUM REPORT: 05/21/2019 09:05 ADDENDUM: RIGHT axilla was evaluated with ultrasound showing no enlarged or morphologically abnormal lymph nodes. Electronically Signed   By: Franki Cabot M.D.   On: 05/21/2019 09:05   Result Date: 05/21/2019 CLINICAL DATA:  Patient describes a palpable lump within the LEFT breast. EXAM: DIGITAL DIAGNOSTIC BILATERAL MAMMOGRAM WITH CAD AND TOMO ULTRASOUND BILATERAL BREAST COMPARISON:  None. ACR Breast Density Category b: There are scattered areas of fibroglandular density. FINDINGS: LEFT breast: There is a spiculated mass within the slightly upper outer quadrant of the LEFT breast, partially retroareolar with retraction of the skin/nipple, measuring approximately 3.3 cm greatest dimension, with surrounding trabecular thickening  raising the possibility of lymphatic involvement/inflammatory breast cancer. RIGHT breast: Spiculated mass within the upper RIGHT breast, at middle depth, 12 o'clock axis region, measuring approximately 1 cm greatest dimension. Additional spiculated mass within the outer RIGHT breast, at middle depth, 9-10 o'clock axis, measuring approximately 1.7 cm greatest dimension. There is overlying skin retraction and surrounding trabecular thickening suggesting lymphatic involvement/inflammatory breast cancer. LEFT axilla: There are numerous enlarged lymph nodes in the LEFT axilla. Mammographic images were processed with CAD. LEFT breast: Targeted ultrasound is performed, showing an irregular shadowing mass extending from the 12-2 o'clock axis, 3 cm from the nipple, measuring approximately 4.9 cm greatest dimension, extending to and likely involving the overlying skin/nipple. RIGHT breast: Targeted ultrasound is performed, showing an irregular hypoechoic mass in the RIGHT breast at the 12 o'clock axis, 3 cm from the nipple, measuring 1.2 x 1 x 1 cm, with associated architectural distortion, corresponding to the mammographic finding.  Additional irregular hypoechoic mass in the RIGHT breast at the 9:30 o'clock axis, 3 cm from the nipple, measuring 2.5 x 1.5 x 2.1 cm, corresponding to the additional mammographic finding. The 2 RIGHT breast masses are located approximately 2.6 cm apart. LEFT axilla: There are at least 8 enlarged/morphologically abnormal lymph nodes in the LEFT axilla. IMPRESSION: 1. Highly suspicious spiculated mass within the LEFT breast, extending from 12-2 o'clock axes, 3 cm from the nipple, measuring approximately 4.9 cm greatest dimension, extending to and likely involving the overlying skin/nipple with associated skin/nipple retraction. Surrounding edematous change suggests lymphangitic involvement/inflammatory breast cancer. 2. Highly suspicious mass within the RIGHT breast at the 12 o'clock axis, 3 cm  from the nipple, measuring 1.2 cm. 3. Highly suspicious mass in the RIGHT breast at the 9:30 o'clock axis, 3 cm from the nipple, measuring 2.5 cm. 4. Highly suspicious enlarged/morphologically abnormal lymph nodes in the LEFT axilla (at least 8 abnormal lymph nodes), likely metastatic lymphadenopathy. RECOMMENDATION: 1. Ultrasound-guided biopsy of the LEFT breast mass, 12-2 o'clock axes, measuring approximately 4.9 cm. 2. Ultrasound-guided biopsy of the RIGHT breast mass at the 12 o'clock axis. 3. Ultrasound-guided biopsy of the RIGHT breast mass at the 9:30 o'clock axis. 4. Ultrasound-guided biopsy of 1 of the enlarged/morphologically abnormal lymph nodes in the LEFT axilla. Ultrasound-guided biopsy will be performed later today. I have discussed the findings and recommendations with the patient. If applicable, a reminder letter will be sent to the patient regarding the next appointment. BI-RADS CATEGORY  5: Highly suggestive of malignancy. Electronically Signed: By: Franki Cabot M.D. On: 05/17/2019 08:50   MM DIAG BREAST TOMO BILATERAL  Addendum Date: 05/21/2019   ADDENDUM REPORT: 05/21/2019 09:05 ADDENDUM: RIGHT axilla was evaluated with ultrasound showing no enlarged or morphologically abnormal lymph nodes. Electronically Signed   By: Franki Cabot M.D.   On: 05/21/2019 09:05   Result Date: 05/21/2019 CLINICAL DATA:  Patient describes a palpable lump within the LEFT breast. EXAM: DIGITAL DIAGNOSTIC BILATERAL MAMMOGRAM WITH CAD AND TOMO ULTRASOUND BILATERAL BREAST COMPARISON:  None. ACR Breast Density Category b: There are scattered areas of fibroglandular density. FINDINGS: LEFT breast: There is a spiculated mass within the slightly upper outer quadrant of the LEFT breast, partially retroareolar with retraction of the skin/nipple, measuring approximately 3.3 cm greatest dimension, with surrounding trabecular thickening raising the possibility of lymphatic involvement/inflammatory breast cancer. RIGHT  breast: Spiculated mass within the upper RIGHT breast, at middle depth, 12 o'clock axis region, measuring approximately 1 cm greatest dimension. Additional spiculated mass within the outer RIGHT breast, at middle depth, 9-10 o'clock axis, measuring approximately 1.7 cm greatest dimension. There is overlying skin retraction and surrounding trabecular thickening suggesting lymphatic involvement/inflammatory breast cancer. LEFT axilla: There are numerous enlarged lymph nodes in the LEFT axilla. Mammographic images were processed with CAD. LEFT breast: Targeted ultrasound is performed, showing an irregular shadowing mass extending from the 12-2 o'clock axis, 3 cm from the nipple, measuring approximately 4.9 cm greatest dimension, extending to and likely involving the overlying skin/nipple. RIGHT breast: Targeted ultrasound is performed, showing an irregular hypoechoic mass in the RIGHT breast at the 12 o'clock axis, 3 cm from the nipple, measuring 1.2 x 1 x 1 cm, with associated architectural distortion, corresponding to the mammographic finding. Additional irregular hypoechoic mass in the RIGHT breast at the 9:30 o'clock axis, 3 cm from the nipple, measuring 2.5 x 1.5 x 2.1 cm, corresponding to the additional mammographic finding. The 2 RIGHT breast masses are located approximately 2.6 cm apart.  LEFT axilla: There are at least 8 enlarged/morphologically abnormal lymph nodes in the LEFT axilla. IMPRESSION: 1. Highly suspicious spiculated mass within the LEFT breast, extending from 12-2 o'clock axes, 3 cm from the nipple, measuring approximately 4.9 cm greatest dimension, extending to and likely involving the overlying skin/nipple with associated skin/nipple retraction. Surrounding edematous change suggests lymphangitic involvement/inflammatory breast cancer. 2. Highly suspicious mass within the RIGHT breast at the 12 o'clock axis, 3 cm from the nipple, measuring 1.2 cm. 3. Highly suspicious mass in the RIGHT breast at  the 9:30 o'clock axis, 3 cm from the nipple, measuring 2.5 cm. 4. Highly suspicious enlarged/morphologically abnormal lymph nodes in the LEFT axilla (at least 8 abnormal lymph nodes), likely metastatic lymphadenopathy. RECOMMENDATION: 1. Ultrasound-guided biopsy of the LEFT breast mass, 12-2 o'clock axes, measuring approximately 4.9 cm. 2. Ultrasound-guided biopsy of the RIGHT breast mass at the 12 o'clock axis. 3. Ultrasound-guided biopsy of the RIGHT breast mass at the 9:30 o'clock axis. 4. Ultrasound-guided biopsy of 1 of the enlarged/morphologically abnormal lymph nodes in the LEFT axilla. Ultrasound-guided biopsy will be performed later today. I have discussed the findings and recommendations with the patient. If applicable, a reminder letter will be sent to the patient regarding the next appointment. BI-RADS CATEGORY  5: Highly suggestive of malignancy. Electronically Signed: By: Franki Cabot M.D. On: 05/17/2019 08:50   [name] AXILLARY NODE CORE BIOPSY LEFT  Addendum Date: 05/27/2019   ADDENDUM REPORT: 05/20/2019 15:49 ADDENDUM: Pathology revealed AT LEAST GRADE II INVASIVE MAMMARY CARCINOMA, MAMMARY CARCINOMA IN SITU of the LEFT breast, 12 o'clock-2 o'clock. This was found to be concordant by Dr. Curlene Dolphin. Pathology revealed METASTATIC CARCINOMA IN 1 OF 1 LYMPH NODE (1/1), WITH EXTRA CAPSULAR EXTENSION of the LEFT inferior axilla. This was found to be concordant by Dr. 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 This was found to be concordant by Dr. Curlene Dolphin. Pathology revealed GRADE I-II INVASIVE AND IN SITU MAMMARY CARCINOMA, VASCULAR CALCIFICATIONS of the RIGHT breast, 12 o'clock, 3cm . This was found to be concordant by Dr. Curlene Dolphin. Pathology results were discussed with the patient and son Niaja Stickley) by telephone. The patient reported doing well after the biopsies with tenderness at the sites. Post biopsy instructions and  care were reviewed and questions were answered. The patient was encouraged to call [facility] for any additional concerns. Surgical consultation has been arranged with Dr. Donnie Mesa at [facility] on May 27, 2019. Pathology results reported by Stacie Acres RN on 05/20/2019. Electronically Signed   By: Curlene Dolphin M.D.   On: 05/20/2019 15:49   Result Date: 05/27/2019 CLINICAL DATA:  Ultrasound-guided core needle biopsy was recommended of a palpable inferior left axillary lymph node. Patient has a suspicious left breast mass which was also biopsied today and dictated separately. EXAM: [name] AXILLARY NODE CORE BIOPSY LEFT COMPARISON:  Previous exam(s). PROCEDURE: I met with the patient and we discussed the procedure of ultrasound-guided biopsy, including benefits and alternatives. We discussed the high likelihood of a successful procedure. We discussed the risks of the procedure, including infection, bleeding, tissue injury, clip migration, and inadequate sampling. Informed written consent was given. The usual time-out protocol was performed immediately prior to the procedure. Using sterile technique and 1% Lidocaine as local anesthetic, under direct ultrasound visualization, a 14 gauge spring-loaded device was used to perform biopsy of a suspicious inferior left axillary lymph node, palpable using a  lateral approach. At the conclusion of the procedure spiral [facility] tissue marker clip was deployed into the biopsy cavity. Follow up 2 view mammogram was performed and dictated separately. IMPRESSION: Ultrasound guided biopsy of a left axillary lymph node. No apparent complications. Electronically Signed: By: Curlene Dolphin M.D. On: 05/17/2019 12:54   MM CLIP PLACEMENT LEFT  Result Date: 05/17/2019 CLINICAL DATA:  Bilateral breast biopsies were performed today as well as a left axillary lymph node biopsy. EXAM: DIAGNOSTIC BILATERAL MAMMOGRAM POST ULTRASOUND BIOPSIES  COMPARISON:  Previous exam(s). FINDINGS: Mammographic images were obtained following ultrasound guided biopsy of a left breast mass spanning 12 o'clock to 2 o'clock position, a left axillary lymph node, a right breast mass at 9:30 position and a right breast mass at 12 o'clock position. LEFT: A ribbon shaped biopsy clip is satisfactorily positioned in the suspicious mass in the upper-outer left breast. -A HydroMARK biopsy clip, spiral shaped, is satisfactorily positioned within enlarged inferior left axillary lymph node. RIGHT: Ribbon shaped biopsy clip is satisfactorily positioned within the biopsied mass at 9:30 position 3 cm from the nipple. -A coil shaped biopsy clip is satisfactorily positioned within the biopsied mass at 12 o'clock position 3 cm from nipple. IMPRESSION: Appropriate positioning of the bilateral biopsy clips as described above. Final Assessment: Post Procedure Mammograms for Marker Placement Electronically Signed   By: Curlene Dolphin M.D.   On: 05/17/2019 13:04   MM CLIP PLACEMENT RIGHT  Result Date: 05/17/2019 CLINICAL DATA:  Bilateral breast biopsies were performed today as well as a left axillary lymph node biopsy. EXAM: DIAGNOSTIC BILATERAL MAMMOGRAM POST ULTRASOUND BIOPSIES COMPARISON:  Previous exam(s). FINDINGS: Mammographic images were obtained following ultrasound guided biopsy of a left breast mass spanning 12 o'clock to 2 o'clock position, a left axillary lymph node, a right breast mass at 9:30 position and a right breast mass at 12 o'clock position. LEFT: A ribbon shaped biopsy clip is satisfactorily positioned in the suspicious mass in the upper-outer left breast. -A HydroMARK biopsy clip, spiral shaped, is satisfactorily positioned within enlarged inferior left axillary lymph node. RIGHT: Ribbon shaped biopsy clip is satisfactorily positioned within the biopsied mass at 9:30 position 3 cm from the nipple. -A coil shaped biopsy clip is satisfactorily positioned within the biopsied  mass at 12 o'clock position 3 cm from nipple. IMPRESSION: Appropriate positioning of the bilateral biopsy clips as described above. Final Assessment: Post Procedure Mammograms for Marker Placement Electronically Signed   By: Curlene Dolphin M.D.   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 This was found to be concordant by Dr. Curlene Dolphin. Pathology revealed METASTATIC CARCINOMA IN 1 OF 1 LYMPH NODE (1/1), WITH EXTRA CAPSULAR EXTENSION of the LEFT inferior axilla. This was found to be concordant by Dr. 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 This was found to be concordant by Dr. Curlene Dolphin. Pathology revealed GRADE I-II INVASIVE AND IN SITU MAMMARY CARCINOMA, VASCULAR CALCIFICATIONS of the RIGHT breast, 12 o'clock, 3cm . This was found to be concordant by Dr. Curlene Dolphin. Pathology results were discussed with the patient and son Jesica Goheen) by telephone. The patient reported doing well after the biopsies with tenderness at the sites. Post biopsy instructions  and care were reviewed and questions were answered. The patient was encouraged to call [facility] for any additional concerns. Surgical consultation has been arranged with Dr. Donnie Mesa at [facility] on May 27, 2019. Pathology results reported by Stacie Acres RN on 05/20/2019. Electronically Signed   By: Curlene Dolphin M.D.   On: 05/20/2019 15:49   Result Date: 05/27/2019 CLINICAL DATA:  Ultrasound-guided core needle biopsy was recommended of a palpable left breast mass spanning from 12:00 to 2:00 axis, periareolar. EXAM: ULTRASOUND GUIDED LEFT BREAST CORE NEEDLE BIOPSY COMPARISON:  Previous exam(s).  PROCEDURE: I met with the patient and we discussed the procedure of ultrasound-guided biopsy, including benefits and alternatives. We discussed the high likelihood of a successful procedure. We discussed the risks of the procedure, including infection, bleeding, tissue injury, clip migration, and inadequate sampling. Informed written consent was given. The usual time-out protocol was performed immediately prior to the procedure. Lesion quadrant: Upper outer quadrant Using sterile technique and 1% Lidocaine as local anesthetic, under direct ultrasound visualization, a 14 gauge spring-loaded device was used to perform biopsy of palpable mass spanning 12 o'clock to 2 o'clock position left breast using a medial approach. At the conclusion of the procedure ribbon tissue marker clip was deployed into the biopsy cavity. Follow up 2 view mammogram was performed and dictated separately. IMPRESSION: Ultrasound guided biopsy of a palpable left breast mass. No apparent complications. Electronically Signed: By: Curlene Dolphin M.D. 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 This was found to be concordant by Dr. Curlene Dolphin. Pathology revealed METASTATIC CARCINOMA IN 1 OF 1 LYMPH NODE (1/1), WITH EXTRA CAPSULAR EXTENSION of the LEFT inferior axilla. This was found to be concordant by Dr. 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 This was found to be concordant by Dr. Curlene Dolphin. Pathology revealed GRADE I-II INVASIVE AND IN SITU MAMMARY CARCINOMA, VASCULAR CALCIFICATIONS of the RIGHT breast, 12 o'clock, 3cmfn . This was found to be concordant by Dr. Curlene Dolphin. Pathology results were discussed with the patient and son  Makinzi Prieur) by telephone. The patient reported doing well after the biopsies with tenderness at the sites. Post biopsy instructions and care were reviewed and questions were answered. The patient was encouraged to call [facility] for any additional concerns. Surgical consultation has been arranged with Dr. Donnie Mesa at [facility] on May 27, 2019. Pathology results reported by Stacie Acres RN on 05/20/2019. Electronically Signed   By: Curlene Dolphin M.D.   On: 05/20/2019 15:48   Result Date: 05/27/2019 CLINICAL DATA:  Ultrasound-guided core needle biopsy was recommended of a suspicious mass in the outer right breast centered at 12 o'clock position 3 cm from nipple. EXAM: ULTRASOUND GUIDED RIGHT BREAST CORE NEEDLE BIOPSY COMPARISON:  Previous exam(s). PROCEDURE: I met with the patient and we discussed the procedure of ultrasound-guided biopsy, including benefits and alternatives. We discussed the high likelihood of a successful procedure. We discussed the risks of the procedure, including infection, bleeding, tissue injury, clip migration, and inadequate sampling. Informed written consent was given. The usual time-out protocol was performed immediately prior to the procedure. Lesion quadrant: Upper outer  quadrant Using sterile technique and 1% Lidocaine as local anesthetic, under direct ultrasound visualization, a 14 gauge spring-loaded device was used to perform biopsy of a suspicious mass at 12 o'clock position 3 cm from the nipple using a lateral approach. At the conclusion of the procedure coil tissue marker clip was deployed into the biopsy cavity. Follow up 2 view mammogram was performed and dictated separately. IMPRESSION: Ultrasound guided biopsy of the right breast. No apparent complications. Electronically Signed: By: Curlene Dolphin M.D. 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 This was found to be concordant by Dr. Curlene Dolphin. Pathology revealed METASTATIC CARCINOMA IN 1 OF 1 LYMPH NODE (1/1), WITH EXTRA CAPSULAR EXTENSION of the LEFT inferior axilla. This was found to be concordant by Dr. 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 This was found to be concordant by Dr. Curlene Dolphin. Pathology revealed GRADE I-II INVASIVE AND IN SITU MAMMARY CARCINOMA, VASCULAR CALCIFICATIONS of the RIGHT breast, 12 o'clock, 3cm . This was found to be concordant by Dr. Curlene Dolphin. Pathology results were discussed with the patient and son Desirai Traxler) by telephone. The patient reported doing well after the biopsies with tenderness at the sites. Post biopsy instructions and care were reviewed and questions were answered. The patient was encouraged to call [facility] for any additional concerns. Surgical consultation has been arranged with Dr. Donnie Mesa at [facility] on May 27, 2019. Pathology results reported by Stacie Acres RN on 05/20/2019. Electronically Signed   By: Curlene Dolphin M.D.   On: 05/20/2019 15:48   Result Date: 05/27/2019 CLINICAL DATA:  Ultrasound-guided core needle biopsy was recommended of a suspicious mass in the 9:30 position the right breast 3 cm from the nipple. EXAM: ULTRASOUND GUIDED RIGHT BREAST CORE NEEDLE BIOPSY COMPARISON:  Previous exam(s). PROCEDURE: I met with the patient and we discussed the procedure of ultrasound-guided biopsy, including benefits and alternatives. We discussed the high likelihood of a successful procedure. We discussed the risks of the procedure, including infection, bleeding, tissue injury, clip migration, and inadequate sampling. Informed written consent was given. The usual  time-out protocol was performed immediately prior to the procedure. Lesion quadrant: Upper outer quadrant Using sterile technique and 1% Lidocaine as local anesthetic, under direct ultrasound visualization, a 14 gauge spring-loaded device was used to perform biopsy of a suspicious palpable mass using a lateral approach. At the conclusion of the procedure ribbon tissue marker clip was deployed into the biopsy cavity. Follow up 2 view mammogram was performed and dictated separately. IMPRESSION: Ultrasound guided biopsy of right breast mass at 9:30 position. No apparent complications. Electronically Signed: By: Curlene Dolphin M.D. On: 05/17/2019 12:58     ELIGIBLE FOR AVAILABLE RESEARCH PROTOCOL: AET  ASSESSMENT: 86 y.o. [place] woman status post bilateral breast biopsies on 05/17/2019 as follows:  (a) on the left upper outer quadrant, a clinical T2 N2, stage IIIA invasive ductal carcinoma,grade 2 or 3, estrogen receptor positive, progesterone receptor negative, with no HER-2 amplification and an MIB-1 of 12%  (b) on the right overlapping sites, 2 morphologically distinct invasive ductal carcinomas, both clinically T1c N0, stage IA, grade 1 or 2, estrogen receptor positive,  progesterone receptor negative, with no HER-2 amplification and an MIB-1 of 5%    (1) neoadjuvant antiestrogens: anastrozole started 05/28/2019, palbociclib to follow  (2) genetics testing  (3) definitive surgery to follow  (4) adjuvant radiation   PLAN: I met today with Jennavie and 2 of her children to review her new diagnosis. Specifically we discussed the biology of her breast cancer, its diagnosis, staging, treatment  options and prognosis. We first reviewed the fact that cancer is not one disease but more than 100 different diseases and that it is important to keep them separate-- otherwise when friends and relatives discuss their own cancer experiences with Yocelyn confusion can result. Similarly we explained that if breast  cancer spreads to the bone or liver, the patient would not have bone cancer or liver cancer, but breast cancer in the bone and breast cancer in the liver: one cancer in three places-- not 3 different cancers which otherwise would have to be treated in 3 different ways.  We discussed the difference between local and systemic therapy. In terms of loco-regional treatment, lumpectomy plus radiation is equivalent to mastectomy as far as survival is concerned.  However in this situation it will not be possible to avoid mastectomy on the left and very likely mastectomy would be preferred on the right as well.  We also noted that in terms of sequencing of treatments, whether systemic therapy or surgery is done first does not affect the ultimate outcome.  In this case we are suggesting starting with antiestrogens so that there is time to stage the patient and the family can make appropriate decisions without having to hurry.  Antiestrogen will also make the eventual surgery easier  We then discussed the rationale for systemic therapy. There is some risk that this cancer may have already spread to other parts of her body.  I am hopeful when we do staging scans we will not find stage IV disease, but even if all the scans are clear the likelihood of Ms. Manzer having microscopic spread of this tumor outside the breast (to the liver lungs or bone for example) is very high.  If she does not receive systemic therapy then regardless of how successful local treatment of the breast is as she will have recurrent disease.  Next we went over the options for systemic therapy which are anti-estrogens, anti-HER-2 immunotherapy, and chemotherapy. [place] does not meet criteria for anti-HER-2 immunotherapy. She is a good candidate for anti-estrogens.  She could receive chemotherapy if that was the only remaining treatment option but we are hoping to avoid that in this elderly patient and it is not clear at this point if we offered at  whether she would accept it  [place] does meet criteria for genetics testing and this is being operationalized  The plan then is to start anastrozole and palbociclib, stage the patient, proceed to surgery at some point and follow-up with adjuvant radiation.  Dianne has a good understanding of the overall plan. She agrees with it. She will call with any problems that may develop before her next visit here.  Total encounter time 70 minutes.Sarajane Jews C. Eldena Dede, MD 05/28/2019 5:38 PM Medical Oncology and Hematology [facility] [place], [place] [address] Tel. [phone]    Fax. [phone]   This document serves as a record of services personally performed by Lurline Del, MD. It was created on his behalf by Wilburn Mylar, a trained medical scribe. The creation of this record is  based on the scribe's personal observations and the provider's statements to them.   I, Lurline Del MD, have reviewed the above documentation for accuracy and completeness, and I agree with the above.   *Total Encounter Time as defined by the Centers for Medicare and Medicaid Services includes, in addition to the face-to-face time of a patient visit (documented in the note above) non-face-to-face time: obtaining and reviewing outside history, ordering and reviewing medications, tests or procedures, care coordination (communications with other health care professionals or caregivers) and documentation in the medical record.

## 2019-05-27 NOTE — H&P (Signed)
History of Present Illness Michelle [name]. Jamin Humphries MD; 05/27/2019 1:49 PM) The patient is a 86 year old female who presents with breast cancer. Referred by Dr. [name] for bilateral breast cancers  This is a 86 year old female with a past medical history significant for pulmonary fibrosis, GERD, hypertension, and arthritis who presents after never having had a previous mammogram. She noticed a palpable mass developing in the lateral left breast that was causing some discomfort. She brought this to the attention of her primary care physician. He sent her for mammogram and ultrasounds. She had 2 masses in the right breast, one mass in the left lateral breast and another area in her left axilla. 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 The lymph node was positive for metastatic carcinoma with extracapsular extension. ER strongly positive, PR is negative, Ki-67 5%, HER-2 negative. The patient is referred to [name] for surgical discussion. She is accompanied by her son and daughter.  The patient does live by herself and is able to do some light housework. However her family checks on her about every day. She has limited strength for mobility. She does use a walker and is currently in a wheelchair for this office visit because she felt tired. She denies any family history of breast cancer.  Pathology revealed AT LEAST GRADE II INVASIVE MAMMARY CARCINOMA, MAMMARY CARCINOMA IN SITU of the LEFT breast, 12 o'clock-2 o'clock. This was found to be concordant by Dr. Curlene Dolphin.  Pathology revealed METASTATIC CARCINOMA IN 1 OF 1 LYMPH NODE (1/1), WITH EXTRA CAPSULAR EXTENSION of the LEFT inferior axilla. This was found to be concordant by Dr. Curlene Dolphin  Pathology revealed GRADE I INVASIVE DUCTAL CARCINOMA of the RIGHT breast, 9:30 o'clock, 3cmfn. This was found to be concordant by Dr. Curlene Dolphin.  Pathology revealed GRADE I-II INVASIVE AND IN SITU  MAMMARY CARCINOMA, VASCULAR CALCIFICATIONS of the RIGHT breast, 12 o'clock, 3cm . This was found to be concordant by Dr. Curlene Dolphin.  Pathology results were discussed with the patient and son Michelle [name]) by telephone. The patient reported doing well after the biopsies with tenderness at the sites. Post biopsy instructions and care were reviewed and questions were answered. The patient was encouraged to call [facility] for any additional concerns.  Surgical consultation has been arranged with Dr. Donnie Mesa at [facility] on May 27, 2019.  Pathology results reported by Stacie Acres RN on 05/20/2019.   Electronically Signed By: Curlene Dolphin M.D. On: 05/20/2019 15:49 CLINICAL DATA: Patient describes a palpable lump within the LEFT breast.  EXAM: DIGITAL DIAGNOSTIC BILATERAL MAMMOGRAM WITH CAD AND TOMO  ULTRASOUND BILATERAL BREAST  COMPARISON: None.  ACR Breast Density Category b: There are scattered areas of fibroglandular density.  FINDINGS: LEFT breast: There is a spiculated mass within the slightly upper outer quadrant of the LEFT breast, partially retroareolar with retraction of the skin/nipple, measuring approximately 3.3 cm greatest dimension, with surrounding trabecular thickening raising the possibility of lymphatic involvement/inflammatory breast cancer.  RIGHT breast: Spiculated mass within the upper RIGHT breast, at middle depth, 12 o'clock axis region, measuring approximately 1 cm greatest dimension.  Additional spiculated mass within the outer RIGHT breast, at middle depth, 9-10 o'clock axis, measuring approximately 1.7 cm greatest dimension. There is overlying skin retraction and surrounding trabecular thickening suggesting lymphatic involvement/inflammatory breast cancer.  LEFT axilla: There are numerous enlarged lymph nodes in the LEFT axilla.  Mammographic images were processed with CAD.  LEFT  breast: Targeted ultrasound is performed, showing an irregular shadowing mass extending from the 12-2 o'clock axis, 3 cm from the nipple, measuring approximately 4.9 cm greatest dimension, extending to and likely involving the overlying skin/nipple.  RIGHT breast: Targeted ultrasound is performed, showing an irregular hypoechoic mass in the RIGHT breast at the 12 o'clock axis, 3 cm from the nipple, measuring 1.2 x 1 x 1 cm, with associated architectural distortion, corresponding to the mammographic finding.  Additional irregular hypoechoic mass in the RIGHT breast at the 9:30 o'clock axis, 3 cm from the nipple, measuring 2.5 x 1.5 x 2.1 cm, corresponding to the additional mammographic finding.  The 2 RIGHT breast masses are located approximately 2.6 cm apart.  LEFT axilla: There are at least 8 enlarged/morphologically abnormal lymph nodes in the LEFT axilla.  IMPRESSION: 1. Highly suspicious spiculated mass within the LEFT breast, extending from 12-2 o'clock axes, 3 cm from the nipple, measuring approximately 4.9 cm greatest dimension, extending to and likely involving the overlying skin/nipple with associated skin/nipple retraction. Surrounding edematous change suggests lymphangitic involvement/inflammatory breast cancer. 2. Highly suspicious mass within the RIGHT breast at the 12 o'clock axis, 3 cm from the nipple, measuring 1.2 cm. 3. Highly suspicious mass in the RIGHT breast at the 9:30 o'clock axis, 3 cm from the nipple, measuring 2.5 cm. 4. Highly suspicious enlarged/morphologically abnormal lymph nodes in the LEFT axilla (at least 8 abnormal lymph nodes), likely metastatic lymphadenopathy.  RECOMMENDATION: 1. Ultrasound-guided biopsy of the LEFT breast mass, 12-2 o'clock axes, measuring approximately 4.9 cm. 2. Ultrasound-guided biopsy of the RIGHT breast mass at the 12 o'clock axis. 3. Ultrasound-guided biopsy of the RIGHT breast mass at the 9:30 o'clock axis. 4.  Ultrasound-guided biopsy of 1 of the enlarged/morphologically abnormal lymph nodes in the LEFT axilla.  Ultrasound-guided biopsy will be performed later today.  I have discussed the findings and recommendations with the patient. If applicable, a reminder letter will be sent to the patient regarding the next appointment.  BI-RADS CATEGORY 5: Highly suggestive of malignancy.  Electronically Signed: By: Franki Cabot M.D. On: 05/17/2019 08:50  CLINICAL DATA: Ultrasound-guided core needle biopsy was recommended of a palpable left breast mass spanning from 12:00 to 2:00 axis, periareolar.  EXAM: ULTRASOUND GUIDED LEFT BREAST CORE NEEDLE BIOPSY  COMPARISON: Previous exam(s).  PROCEDURE: I met with the patient and we discussed the procedure of ultrasound-guided biopsy, including benefits and alternatives. We discussed the high likelihood of a successful procedure. We discussed the risks of the procedure, including infection, bleeding, tissue injury, clip migration, and inadequate sampling. Informed written consent was given. The usual time-out protocol was performed immediately prior to the procedure.  Lesion quadrant: Upper outer quadrant  Using sterile technique and 1% Lidocaine as local anesthetic, under direct ultrasound visualization, a 14 gauge spring-loaded device was used to perform biopsy of palpable mass spanning 12 o'clock to 2 o'clock position left breast using a medial approach. At the conclusion of the procedure ribbon tissue marker clip was deployed into the biopsy cavity. Follow up 2 view mammogram was performed and dictated separately.  IMPRESSION: Ultrasound guided biopsy of a palpable left breast mass. No apparent complications.  Electronically Signed: By: Curlene Dolphin M.D. On: 05/17/2019 12:57  CLINICAL DATA: Ultrasound-guided core needle biopsy was recommended of a palpable inferior left axillary lymph node. Patient has a suspicious left breast  mass which was also biopsied today and dictated separately.  EXAM: [name] AXILLARY NODE CORE BIOPSY LEFT  COMPARISON: Previous exam(s).  PROCEDURE: I met  with the patient and we discussed the procedure of ultrasound-guided biopsy, including benefits and alternatives. We discussed the high likelihood of a successful procedure. We discussed the risks of the procedure, including infection, bleeding, tissue injury, clip migration, and inadequate sampling. Informed written consent was given. The usual time-out protocol was performed immediately prior to the procedure.  Using sterile technique and 1% Lidocaine as local anesthetic, under direct ultrasound visualization, a 14 gauge spring-loaded device was used to perform biopsy of a suspicious inferior left axillary lymph node, palpable using a lateral approach. At the conclusion of the procedure spiral HydroMARK tissue marker clip was deployed into the biopsy cavity. Follow up 2 view mammogram was performed and dictated separately.  IMPRESSION: Ultrasound guided biopsy of a left axillary lymph node. No apparent complications.  Electronically Signed: By: Susan Turner M.D. On: 05/17/2019 12:54  CLINICAL DATA: Ultrasound-guided core needle biopsy was recommended of a suspicious mass in the outer right breast centered at 12 o'clock position 3 cm from nipple.  EXAM: ULTRASOUND GUIDED RIGHT BREAST CORE NEEDLE BIOPSY  COMPARISON: Previous exam(s).  PROCEDURE: I met with the patient and we discussed the procedure of ultrasound-guided biopsy, including benefits and alternatives. We discussed the high likelihood of a successful procedure. We discussed the risks of the procedure, including infection, bleeding, tissue injury, clip migration, and inadequate sampling. Informed written consent was given. The usual time-out protocol was performed immediately prior to the procedure.  Lesion quadrant: Upper outer quadrant  Using sterile  technique and 1% Lidocaine as local anesthetic, under direct ultrasound visualization, a 14 gauge spring-loaded device was used to perform biopsy of a suspicious mass at 12 o'clock position 3 cm from the nipple using a lateral approach. At the conclusion of the procedure coil tissue marker clip was deployed into the biopsy cavity. Follow up 2 view mammogram was performed and dictated separately.  IMPRESSION: Ultrasound guided biopsy of the right breast. No apparent complications.  Electronically Signed: By: Susan Turner M.D. On: 05/17/2019 12:59  CLINICAL DATA: Ultrasound-guided core needle biopsy was recommended of a suspicious mass in the 9:30 position the right breast 3 cm from the nipple.  EXAM: ULTRASOUND GUIDED RIGHT BREAST CORE NEEDLE BIOPSY  COMPARISON: Previous exam(s).  PROCEDURE: I met with the patient and we discussed the procedure of ultrasound-guided biopsy, including benefits and alternatives. We discussed the high likelihood of a successful procedure. We discussed the risks of the procedure, including infection, bleeding, tissue injury, clip migration, and inadequate sampling. Informed written consent was given. The usual time-out protocol was performed immediately prior to the procedure.  Lesion quadrant: Upper outer quadrant  Using sterile technique and 1% Lidocaine as local anesthetic, under direct ultrasound visualization, a 14 gauge spring-loaded device was used to perform biopsy of a suspicious palpable mass using a lateral approach. At the conclusion of the procedure ribbon tissue marker clip was deployed into the biopsy cavity. Follow up 2 view mammogram was performed and dictated separately.  IMPRESSION: Ultrasound guided biopsy of right breast mass at 9:30 position. No apparent complications.  Electronically Signed: By: Susan Turner M.D. On: 05/17/2019 12:58   Problem List/Past Medical (Matthew K. Tsuei, MD; 05/27/2019 1:49 PM) BREAST  CANCER (C50.919) MALIGNANT NEOPLASM OF UPPER-OUTER QUADRANT OF BOTH BREASTS IN FEMALE, ESTROGEN RECEPTOR POSITIVE (C50.411, C50.412)  Past Surgical History (Kelsey Phillips, CMA; 05/27/2019 11:53 AM) Breast Biopsy Bilateral. Hysterectomy (not due to cancer) - Complete Shoulder Surgery Left.  Diagnostic Studies History (Kelsey Phillips, CMA; 05/27/2019 11:53 AM) Colonoscopy never Mammogram within last   year Pap Smear >5 years ago  Allergies (Kelsey Phillips, CMA; 05/27/2019 11:54 AM) No Known Drug Allergies [05/27/2019]: Allergies Reconciled  Medication History (Kelsey Phillips, CMA; 05/27/2019 11:55 AM) Pravastatin Sodium (20MG Tablet, Oral) Active. Calcium (500MG Tablet, Oral) Active. Tamsulosin HCl (0.4MG Capsule, Oral) Active. Vitamin D (400UNIT Tablet, Oral) Active. Esomeprazole Magnesium (20MG Packet, Oral) Active. Losartan Potassium-HCTZ (100-12.5MG Tablet, Oral) Active. Medications Reconciled  Social History (Kelsey Phillips, CMA; 05/27/2019 11:53 AM) No alcohol use No caffeine use No drug use Tobacco use Never smoker.  Pregnancy / Birth History (Kelsey Phillips, CMA; 05/27/2019 11:53 AM) Gravida 4 Maternal age 15-20 Para 4  Other Problems (Matthew K. Tsuei, MD; 05/27/2019 1:49 PM) Arthritis Back Pain Bladder Problems Breast Cancer Gastroesophageal Reflux Disease High blood pressure Lump In Breast     Review of Systems (Kelsey Phillips CMA; 05/27/2019 11:53 AM) General Present- Chills, Fatigue and Weight Loss. Not Present- Appetite Loss, Fever, Night Sweats and Weight Gain. Skin Present- Dryness and Rash. Not Present- Change in Wart/Mole, Hives, Jaundice, New Lesions, Non-Healing Wounds and Ulcer. HEENT Present- Hearing Loss, Ringing in the Ears, Seasonal Allergies, Visual Disturbances and Wears glasses/contact lenses. Not Present- Earache, Hoarseness, Nose Bleed, Oral Ulcers, Sinus Pain, Sore Throat and Yellow Eyes. Respiratory  Present- Difficulty Breathing. Not Present- Bloody sputum, Chronic Cough, Snoring and Wheezing. Breast Present- Breast Mass, Breast Pain and Skin Changes. Not Present- Nipple Discharge. Cardiovascular Present- Difficulty Breathing Lying Down, Leg Cramps, Rapid Heart Rate and Shortness of Breath. Not Present- Chest Pain, Palpitations and Swelling of Extremities. Gastrointestinal Present- Change in Bowel Habits, Constipation, Difficulty Swallowing, Excessive gas, Gets full quickly at meals and Indigestion. Not Present- Abdominal Pain, Bloating, Bloody Stool, Chronic diarrhea, Hemorrhoids, Nausea, Rectal Pain and Vomiting. Musculoskeletal Present- Back Pain, Joint Pain, Joint Stiffness, Muscle Pain and Muscle Weakness. Not Present- Swelling of Extremities. Neurological Present- Decreased Memory, Trouble walking and Weakness. Not Present- Fainting, Headaches, Numbness, Seizures, Tingling and Tremor. Psychiatric Present- Anxiety and Frequent crying. Not Present- Bipolar, Change in Sleep Pattern, Depression and Fearful. Endocrine Present- Cold Intolerance and Hair Changes. Not Present- Excessive Hunger, Heat Intolerance, Hot flashes and New Diabetes. Hematology Present- Easy Bruising. Not Present- Blood Thinners, Excessive bleeding, Gland problems, HIV and Persistent Infections.  Vitals (Kelsey Phillips CMA; 05/27/2019 11:54 AM) 05/27/2019 11:54 AM Weight: 110.4 lb Height: 61in Body Surface Area: 1.47 m Body Mass Index: 20.86 kg/m  Temp.: 98.2F  Pulse: 89 (Regular)  BP: 98/66 (Sitting, Left Arm, Standard)        Physical Exam (Matthew K. Tsuei MD; 05/27/2019 1:54 PM)  The physical exam findings are as follows: Note:Frail appearing elderly female sitting in a wheelchair in no apparent distress Eyes: Pupils equal, round; sclera anicteric; moist conjunctiva; no lid lag HENT: Oral mucosa moist; good dentition Neck: No masses palpated, trachea midline; no thyromegaly Lungs: CTA  bilaterally; normal respiratory effort CV: Regular rate and rhythm; no murmurs; extremities well-perfused with no edema Breasts - Right - 12:00 3 cmfn 1.5 cm firm palpable mass; mobile; Right - 9:30 3 cmfn 3 cm firm palpable mass; mobile; overlying ecchymosis from biopsy Right axilla - negative clinically  Left - 12:00 - 2:00 - 5 cm firm nodular mass with some skin tenting; may involve the nipple Left axilla - multiple firm enlarged lymph nodes Abd: +bowel sounds, soft, non-tender, no palpable organomegaly; no palpable hernias Musc: Unable to assess gait; no apparent clubbing or cyanosis in extremities Lymphatic: No palpable cervical or axillary lymphadenopathy Skin: Warm, dry; no sign of jaundice Psychiatric -   alert and oriented x 4; calm mood and affect    Assessment & Plan Rodman Key K. Sadae Arrazola MD; 05/27/2019 1:56 PM)  MALIGNANT NEOPLASM OF UPPER-OUTER QUADRANT OF BOTH BREASTS IN FEMALE, ESTROGEN RECEPTOR POSITIVE (C50.411)  Current Plans Will discuss at Breast Conference. Possible bilateral mastectomies for control of gross disease, but patient will likely need systemic anti-estrogen treatment for palliative control. Note:The patient is quite frail and would likely not be a candidate for aggressive surgery or chemotherapy. However, would recommend referral to medical oncology for discussion of adjuvant antiestrogen treatment. If this is unsuccessful in controlling the size of the tumors, would consider bilateral mastectomies to control the gross disease. We would likely need to do a limited left axillary lymph node dissection to remove the gross tumors. The patient may have difficulty tolerating such procedure with her past medical history. We will discuss her case further have breast cancer conference this week. We may decide to just initiate antiestrogen therapy and assess her response to that treatment.  Michelle [name]. Georgette Dover, MD, [facility]  General/ Trauma  Surgery   05/27/2019 1:56 PM

## 2019-05-28 ENCOUNTER — Inpatient Hospital Stay (HOSPITAL_BASED_OUTPATIENT_CLINIC_OR_DEPARTMENT_OTHER): Payer: Medicare HMO | Admitting: Oncology

## 2019-05-28 ENCOUNTER — Other Ambulatory Visit: Payer: Self-pay

## 2019-05-28 ENCOUNTER — Inpatient Hospital Stay: Payer: Medicare HMO | Attending: Oncology

## 2019-05-28 VITALS — BP 100/62 | HR 75 | Temp 99.1°F | Resp 18 | Ht 61.0 in | Wt 114.4 lb

## 2019-05-28 DIAGNOSIS — C50811 Malignant neoplasm of overlapping sites of right female breast: Secondary | ICD-10-CM | POA: Diagnosis not present

## 2019-05-28 DIAGNOSIS — C50412 Malignant neoplasm of upper-outer quadrant of left female breast: Secondary | ICD-10-CM | POA: Insufficient documentation

## 2019-05-28 DIAGNOSIS — Z90722 Acquired absence of ovaries, bilateral: Secondary | ICD-10-CM | POA: Insufficient documentation

## 2019-05-28 DIAGNOSIS — Z7982 Long term (current) use of aspirin: Secondary | ICD-10-CM | POA: Diagnosis not present

## 2019-05-28 DIAGNOSIS — C50411 Malignant neoplasm of upper-outer quadrant of right female breast: Secondary | ICD-10-CM | POA: Diagnosis not present

## 2019-05-28 DIAGNOSIS — J8410 Pulmonary fibrosis, unspecified: Secondary | ICD-10-CM | POA: Diagnosis not present

## 2019-05-28 DIAGNOSIS — Z170 Estrogen receptor positive status [ER+]: Secondary | ICD-10-CM

## 2019-05-28 DIAGNOSIS — Z79899 Other long term (current) drug therapy: Secondary | ICD-10-CM | POA: Insufficient documentation

## 2019-05-28 DIAGNOSIS — M1990 Unspecified osteoarthritis, unspecified site: Secondary | ICD-10-CM

## 2019-05-28 LAB — CBC WITH DIFFERENTIAL (CANCER CENTER ONLY)
Abs Immature Granulocytes: 0.01 10*3/uL (ref 0.00–0.07)
Basophils Absolute: 0.1 10*3/uL (ref 0.0–0.1)
Basophils Relative: 1 %
Eosinophils Absolute: 0.3 10*3/uL (ref 0.0–0.5)
Eosinophils Relative: 4 %
HCT: 34.8 % — ABNORMAL LOW (ref 36.0–46.0)
Hemoglobin: 11.1 g/dL — ABNORMAL LOW (ref 12.0–15.0)
Immature Granulocytes: 0 %
Lymphocytes Relative: 21 %
Lymphs Abs: 1.3 10*3/uL (ref 0.7–4.0)
MCH: 28.2 pg (ref 26.0–34.0)
MCHC: 31.9 g/dL (ref 30.0–36.0)
MCV: 88.3 fL (ref 80.0–100.0)
Monocytes Absolute: 0.4 10*3/uL (ref 0.1–1.0)
Monocytes Relative: 7 %
Neutro Abs: 4.2 10*3/uL (ref 1.7–7.7)
Neutrophils Relative %: 67 %
Platelet Count: 254 10*3/uL (ref 150–400)
RBC: 3.94 MIL/uL (ref 3.87–5.11)
RDW: 13.6 % (ref 11.5–15.5)
WBC Count: 6.3 10*3/uL (ref 4.0–10.5)
nRBC: 0 % (ref 0.0–0.2)

## 2019-05-28 LAB — CMP (CANCER CENTER ONLY)
ALT: 11 U/L (ref 0–44)
AST: 17 U/L (ref 15–41)
Albumin: 3.9 g/dL (ref 3.5–5.0)
Alkaline Phosphatase: 51 U/L (ref 38–126)
Anion gap: 10 (ref 5–15)
BUN: 27 mg/dL — ABNORMAL HIGH (ref 8–23)
CO2: 28 mmol/L (ref 22–32)
Calcium: 9.5 mg/dL (ref 8.9–10.3)
Chloride: 105 mmol/L (ref 98–111)
Creatinine: 1.03 mg/dL — ABNORMAL HIGH (ref 0.44–1.00)
GFR, Est AFR Am: 57 mL/min — ABNORMAL LOW (ref >60)
GFR, Estimated: 49 mL/min — ABNORMAL LOW (ref >60)
Glucose, Bld: 127 mg/dL — ABNORMAL HIGH (ref 70–99)
Potassium: 3.3 mmol/L — ABNORMAL LOW (ref 3.5–5.1)
Sodium: 143 mmol/L (ref 135–145)
Total Bilirubin: 0.6 mg/dL (ref 0.3–1.2)
Total Protein: 7 g/dL (ref 6.5–8.1)

## 2019-05-28 MED ORDER — PALBOCICLIB 125 MG PO CAPS: 125.0000 mg | ORAL_CAPSULE | Freq: Every day | ORAL | 6 refills | Status: DC

## 2019-05-28 MED ORDER — ANASTROZOLE 1 MG PO TABS: 1.0000 mg | ORAL_TABLET | Freq: Every day | ORAL | 4 refills | Status: DC

## 2019-05-29 ENCOUNTER — Telehealth: Payer: Self-pay

## 2019-05-29 ENCOUNTER — Encounter: Payer: Self-pay | Admitting: *Deleted

## 2019-05-29 ENCOUNTER — Telehealth: Payer: Self-pay | Admitting: Oncology

## 2019-05-29 ENCOUNTER — Telehealth: Payer: Self-pay | Admitting: Pharmacist

## 2019-05-29 DIAGNOSIS — C50412 Malignant neoplasm of upper-outer quadrant of left female breast: Secondary | ICD-10-CM

## 2019-05-29 DIAGNOSIS — C50411 Malignant neoplasm of upper-outer quadrant of right female breast: Secondary | ICD-10-CM

## 2019-05-29 NOTE — Telephone Encounter (Signed)
Scheduled appts per 4/13 los. Pt's daughter confirmed appt dates and times.

## 2019-05-29 NOTE — Telephone Encounter (Signed)
Oral Oncology Patient Advocate Encounter  Received notification from [facility] that prior authorization for Michelle [name] is required.  PA submitted on CoverMyMeds Key [id number] Status is pending  Oral Oncology Clinic will continue to follow.  [place] Patient [place] Phone [phone] Fax [phone] 05/29/2019 12:27 PM

## 2019-05-29 NOTE — Telephone Encounter (Signed)
Oral Oncology Pharmacist Encounter  Received new prescription for Ibrance (palbociclib) for the treatment of breast cancer in conjunction with anastrozole, planned duration until disease progression or unacceptable drug toxicity.  CMP from 05/28/19 assessed, no relevant lab abnormalities. Prescription dose and frequency assessed.   Current medication list in Epic reviewed, no DDIs with palbociclib identified.  Prescription has been e-scribed to the [facility] for benefits analysis and approval.  Oral Oncology Clinic will continue to follow for insurance authorization, copayment issues, initial counseling and start date.  Darl Pikes, PharmD, BCPS, BCOP, CPP Hematology/Oncology Clinical Pharmacist [facility] [place] [facility] [phone]  05/29/2019 12:21 PM

## 2019-05-29 NOTE — Telephone Encounter (Signed)
Oral Oncology Patient Advocate Encounter  Prior Authorization for Michelle [name] has been approved.    PA# [id number] Effective dates: 05/29/19 through 11/28/19  Patients co-pay is $8.68  Oral Oncology Clinic will continue to follow.   [place] Patient [name] Phone [phone] Fax [phone] 05/29/2019 12:56 PM

## 2019-05-29 NOTE — Telephone Encounter (Signed)
Oral Oncology Patient Advocate Encounter   Was successful in securing patient an $51 grant from Patient Michelle [name] Michelle [name]) to provide copayment coverage for Ibrance.  This will keep the out of pocket expense at $0.     I have spoken with the patient.    The billing information is as follows and has been shared with Michelle [name].   Member ID: [id number] Group ID: [id number] RxBin: [id number] Dates of Eligibility: 02/28/19 through 05/27/20  Fund:  Michelle [name] Patient Michelle [name] Phone [phone] Fax [phone] 05/29/2019 12:58 PM

## 2019-05-30 MED ORDER — PALBOCICLIB 125 MG PO TABS: 125.0000 mg | ORAL_TABLET | Freq: Every day | ORAL | 6 refills | Status: DC

## 2019-05-30 MED FILL — IBRANCE 125 MG TABS: 125 | 28 days supply | Qty: 21 | Fill #0

## 2019-05-30 NOTE — Telephone Encounter (Signed)
Oral Chemotherapy Pharmacist Encounter  [place] will deliver the [facility] tomorrow 05/31/19. She daughter knows to get started when they receive the medication.  Patient Education I spoke with patient's daughter Michelle [name] for overview of new oral chemotherapy medication: Ibrance (palbociclib) for the treatment of breast cancer in conjunction with anastrozole, planned duration until disease progression or unacceptable drug toxicity.   Counseled [name] on administration, dosing, side effects, monitoring, drug-food interactions, safe handling, storage, and disposal. Patient will take 1 tablet (125 mg total) by mouth daily. Take for 21 days on, 7 days off, repeat every 28 days.  Side effects include but not limited to: decreased wbc, fatigue, N/V, hair loss.    Reviewed with Michelle [name] importance of keeping a medication schedule and plan for any missed doses.  Michelle [name] voiced understanding and appreciation. All questions answered. Medication handout placed in the mail.  Provided [place] with Oral Chemotherapy Navigation Clinic phone number. Patient knows to call the office with questions or concerns. Oral Chemotherapy Navigation Clinic will continue to follow.  Darl Pikes, PharmD, BCPS, BCOP, CPP Hematology/Oncology Clinical Pharmacist [facility] [place] [facility] [phone]  05/30/2019 10:00 AM

## 2019-05-31 NOTE — Telephone Encounter (Signed)
Thank yu!  GM

## 2019-06-05 ENCOUNTER — Ambulatory Visit (HOSPITAL_COMMUNITY): Payer: Medicare HMO

## 2019-06-06 ENCOUNTER — Encounter: Payer: Self-pay | Admitting: *Deleted

## 2019-06-06 ENCOUNTER — Ambulatory Visit (HOSPITAL_COMMUNITY): Payer: Medicare HMO

## 2019-06-07 ENCOUNTER — Ambulatory Visit (HOSPITAL_COMMUNITY)
Admission: RE | Admit: 2019-06-07 | Discharge: 2019-06-07 | Disposition: A | Discharge status: Home / Self Care | Payer: Medicare HMO | Source: Ambulatory Visit | Attending: Oncology | Admitting: Oncology

## 2019-06-07 ENCOUNTER — Other Ambulatory Visit: Payer: Self-pay

## 2019-06-07 DIAGNOSIS — C50412 Malignant neoplasm of upper-outer quadrant of left female breast: Secondary | ICD-10-CM | POA: Diagnosis not present

## 2019-06-07 DIAGNOSIS — Z170 Estrogen receptor positive status [ER+]: Secondary | ICD-10-CM | POA: Insufficient documentation

## 2019-06-07 DIAGNOSIS — C50911 Malignant neoplasm of unspecified site of right female breast: Secondary | ICD-10-CM | POA: Diagnosis not present

## 2019-06-07 DIAGNOSIS — C50811 Malignant neoplasm of overlapping sites of right female breast: Secondary | ICD-10-CM | POA: Insufficient documentation

## 2019-06-07 DIAGNOSIS — C50411 Malignant neoplasm of upper-outer quadrant of right female breast: Secondary | ICD-10-CM | POA: Insufficient documentation

## 2019-06-07 DIAGNOSIS — M1990 Unspecified osteoarthritis, unspecified site: Secondary | ICD-10-CM | POA: Diagnosis not present

## 2019-06-07 DIAGNOSIS — J8410 Pulmonary fibrosis, unspecified: Secondary | ICD-10-CM | POA: Insufficient documentation

## 2019-06-07 DIAGNOSIS — C50912 Malignant neoplasm of unspecified site of left female breast: Secondary | ICD-10-CM | POA: Diagnosis not present

## 2019-06-07 MED ORDER — IOHEXOL 300 MG/ML  SOLN
100.0000 mL | Freq: Once | INTRAMUSCULAR | Status: AC | PRN
  Administered 2019-06-07: 80 mL via INTRAVENOUS

## 2019-06-07 MED ORDER — SODIUM CHLORIDE (PF) 0.9 % IJ SOLN
INTRAMUSCULAR | Status: AC
  Filled 2019-06-07: qty 50

## 2019-06-10 ENCOUNTER — Other Ambulatory Visit: Payer: Self-pay | Admitting: Oncology

## 2019-06-10 ENCOUNTER — Encounter: Payer: Self-pay | Admitting: *Deleted

## 2019-06-10 NOTE — Progress Notes (Signed)
I called the patient's daughter [name] and let her know that we do not see evidence of cancer in the lungs liver or bones.  There is some left supraclavicular disease which is really an extension of the regional adenopathy.  This will have to be dealt with with by radiation postop.  According to [facility] the patient so far is tolerating her medication well.  She sees me again 06/21/2019.

## 2019-06-12 ENCOUNTER — Encounter (HOSPITAL_COMMUNITY): Payer: Medicare HMO

## 2019-06-13 ENCOUNTER — Encounter: Payer: Self-pay | Admitting: *Deleted

## 2019-06-18 ENCOUNTER — Encounter: Payer: Self-pay | Admitting: *Deleted

## 2019-06-20 ENCOUNTER — Other Ambulatory Visit: Payer: Self-pay | Admitting: *Deleted

## 2019-06-20 DIAGNOSIS — C50412 Malignant neoplasm of upper-outer quadrant of left female breast: Secondary | ICD-10-CM

## 2019-06-20 DIAGNOSIS — C50411 Malignant neoplasm of upper-outer quadrant of right female breast: Secondary | ICD-10-CM

## 2019-06-20 NOTE — Progress Notes (Signed)
[place]  [phone] [phone]     ID: Michelle [name] DOB: 24-Jul-1930  MR#: [id number]  GEF#:[id number]  Patient Care Team: Michelle Battles, MD as PCP - General (Internal Medicine) Michelle [name], Michelle Dad, MD as Consulting Physician (Oncology) Michelle Mesa, MD as Consulting Physician (General Surgery) Michelle Kaufmann, RN as Oncology Nurse Navigator Michelle Germany, RN as Oncology Nurse Navigator Michelle Cruel, MD OTHER MD:  CHIEF COMPLAINT: Bilateral estrogen receptor positive breast cancers  CURRENT TREATMENT: neoadjuvant anti-estrogens and palbociclib   INTERVAL HISTORY: Michelle [name] returns today for follow up of her bilateral estrogen receptor positive breast cancers accompanied by her daughter Michelle [name] and her son Michelle [name].  She was evaluated in the breast cancer clinic on 05/28/2019, and her case was discussed in the multidisciplinary breast cancer clinic on 05/29/2019.  She was started on neoadjuvant anastrozole on 05/28/2019 and palbociclib was added 3 weeks ago..  Since consultation, she underwent staging CT chest/abdomen/pelvis on 06/07/2019. This showed: known 4.1 cm left breast malignancy and lymphadenopathy in the left axillary, retropectoral, and supraclavicular areas; 6 mm anterior left lower lobe ground-glass pulmonary nodule; no evidence of metastatic disease in abdomen or pelvis.  She is scheduled for bone scan on 06/26/2019.   REVIEW OF SYSTEMS: Michelle [name] tolerated her palbociclib generally well although it did make her somewhat fatigued.  She did not have problems with nausea.  She has had no intercurrent fever or bleeding.  She does feel "bad", meaning that her whole body does not feel right although she does not have any focal pain that she can tell me about.  She spent a good deal of the day in bed yesterday she says.  She did have a urinary tract infection which may have caused some of the symptoms.  She tells me she was treated with an  antibiotic but I do not have a record of what Dr. Sharlett [name] put her on.  We checked a urinalysis again today since she was still having some symptoms.   HISTORY OF CURRENT ILLNESS: From the original intake note:  Michelle [name] herself palpated a left breast mass around January 2021, as she recalls. She underwent bilateral diagnostic mammography with tomography and bilateral breast ultrasonography at [facility] on 05/17/2019 showing: LEFT BREAST: breast density category B; 4.9 cm spiculated left breast mass extending from 12-2 o'clock, as well as to and likely involving overlying skin/nipple (with associated retraction); at least 8 abnormal left axillary lymph nodes;   RIGHT BREAST: two suspicious right breast masses, 1.2 cm at 12 o'clock and 2.5 cm at 9:30, approximately 2.6 cm apart; negative right axilla.  Accordingly that same day, she proceeded to biopsy of the bilateral breast areas in question. The pathology from this procedure ([id number]) showed:  1. Left breast, 12-2 o'clock  - invasive and in situ mammary carcinoma, grade 2, e-cadherin positive   - estrogen receptor, 100% positive with strong staining intensity and progesterone receptor, 0% negative. Proliferation marker Ki67 at 12%. HER2 negative by immunohistochemistry (1+). 4. Lymph node, left axilla  - metastatic carcinoma with extra capsular extension (1/1)  2. Right breast, 9:30  - invasive ductal carcinoma, grade 1  - morphologically dissimilar from left breast carcinoma  - estrogen receptor, 100% positive with strong staining intensity and progesterone receptor, 0% negative. Proliferation marker Ki67 at 5%. HER2 negative by immunohistochemistry (1+). 3. Right breast, 12 o'clock  - invasive and in situ mammary carcinoma, grade 1-2   - vascular calcifications  -  morphologically dissimilar from other right breast carcinoma  - estrogen receptor, 100% positive with strong staining intensity and progesterone receptor, 0%  negative. Proliferation marker Ki67 at 5%. HER2 negative by immunohistochemistry (1+).   The patient's subsequent history is as detailed below.   PAST MEDICAL HISTORY: Past Medical History:  Diagnosis Date  . Arthritis   . Hypertension     PAST SURGICAL HISTORY: Past Surgical History:  Procedure Laterality Date  . SHOULDER SURGERY     Right  Status post total abdominal hysterectomy with bilateral salpingo-oophorectomy, status post appendectomy, status post tonsillectomy.   FAMILY HISTORY: Family History  Adopted: Yes  The patient is adopted and has no information on her parents siblings or other blood relatives   GYNECOLOGIC HISTORY:  No LMP recorded. Patient is postmenopausal. Menarche: Does not recall Age at first live birth: 86 years old Michelle [name] 4 LMP 55s Hysterectomy?  Yes BSO?  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  Her children are Michelle [name], who lives in [place] and works for [employer] Michelle [name]) lives in [place] and runs his own business, and Michelle [name]") who lives in [place] and works for [employer]  He is also training his children to box.  Son Michelle [name] died from hepatitis C at age 53.  The patient has 11 grandchildren and more great grandchildren and great great-grandchildren than she can count.  She attends a local Michelle [name]: Not in place.  At the 05/28/2019 visit the patient was given the appropriate documents to complete and notarized at her discretion.   HEALTH MAINTENANCE: Social History   Tobacco Use  . Smoking status: Never Smoker  Substance Use Topics  . Alcohol use: No  . Drug use: No    Frequency: 3.0 times per week     Colonoscopy: Never  PAP: Remote  Bone density: Never   No Known Allergies  Current Outpatient Medications  Medication Sig Dispense Refill  . anastrozole (ARIMIDEX) 1 MG  tablet Take 1 tablet (1 mg total) by mouth daily. 90 tablet 4  . aspirin 325 MG tablet Take 325 mg by mouth 2 (two) times daily.    . Calcium Carbonate-Vit D-Min (CALCIUM 600 + MINERALS) 600-200 MG-UNIT TABS Take 1-2 tablets by mouth daily. Take 2 tablets (1200 mg) in the am and Take 1 tablet (600 mg) at the pm.    . esomeprazole (NEXIUM) 20 MG capsule Take 20 mg by mouth daily as needed (acid reflux).    . gabapentin (NEURONTIN) 300 MG capsule Take 300 mg by mouth 3 (three) times daily.    Marland Kitchen losartan-hydrochlorothiazide (HYZAAR) 100-12.5 MG per tablet Take 1 tablet by mouth daily.    . magnesium oxide (MAG-OX) 400 MG tablet Take 400 mg by mouth daily.    Marland Kitchen omeprazole (PRILOSEC) 20 MG capsule Take 1 capsule (20 mg total) by mouth daily. 30 capsule 0  . palbociclib (IBRANCE) 100 MG capsule Take 1 capsule (100 mg total) by mouth daily with breakfast. Take whole with food. Take for 21 days on, 7 days off, repeat every 28 days. 21 capsule 6  . pravastatin (PRAVACHOL) 20 MG tablet Take 20 mg by mouth daily.    . vitamin B-12 (CYANOCOBALAMIN) 1000 MCG tablet Take 1,000 mcg by mouth daily.     No current facility-administered medications for this visit.  OBJECTIVE: African-American woman examined in a wheelchair  Vitals:   06/21/19 1003  BP: (!) 127/93  Pulse: 67  Resp: 17  Temp: 98.7 F (37.1 C)  SpO2: 100%     Body mass index is 22.3 kg/m.   Wt Readings from Last 3 Encounters:  06/21/19 118 lb (53.5 kg)  05/28/19 114 lb 6.4 oz (51.9 kg)  07/14/14 150 lb 2.1 oz (68.1 kg)      ECOG FS:2 - Symptomatic, <50% confined to bed  Sclerae unicteric, EOMs intact Wearing a mask No cervical or supraclavicular adenopathy Lungs no rales or rhonchi Heart regular rate and rhythm Abd soft, nontender, positive bowel sounds MSK no focal spinal tenderness, no upper extremity lymphedema Neuro: nonfocal, well oriented, appropriate affect Breasts: The left breast mass and left axillary adenopathy are  still hard, easily movable and easily palpable.  There are not appreciably changed from baseline at this point.   Left beast 05/28/2019    Right breast, 05/28/2019    LAB RESULTS:  CMP     Component Value Date/Time   NA 143 06/21/2019 0941   K 4.1 06/21/2019 0941   CL 106 06/21/2019 0941   CO2 23 06/21/2019 0941   GLUCOSE 82 06/21/2019 0941   BUN 24 (H) 06/21/2019 0941   CREATININE 1.18 (H) 06/21/2019 0941   CALCIUM 8.9 06/21/2019 0941   PROT 6.7 06/21/2019 0941   ALBUMIN 3.8 06/21/2019 0941   AST 21 06/21/2019 0941   ALT 25 06/21/2019 0941   ALKPHOS 55 06/21/2019 0941   BILITOT 0.6 06/21/2019 0941   GFRNONAA 42 (L) 06/21/2019 0941   GFRAA 48 (L) 06/21/2019 0941    No results found for: TOTALPROTELP, ALBUMINELP, A1GS, A2GS, BETS, BETA2SER, GAMS, MSPIKE, SPEI  Lab Results  Component Value Date   WBC 1.9 (L) 06/21/2019   NEUTROABS 0.7 (L) 06/21/2019   HGB 9.2 (L) 06/21/2019   HCT 28.9 (L) 06/21/2019   MCV 89.2 06/21/2019   PLT 94 (L) 06/21/2019    No results found for: LABCA2  No components found for: RCVELF810  No results for input(s): INR in the last 168 hours.  No results found for: LABCA2  No results found for: FBP102  No results found for: HEN277  No results found for: OEU235  No results found for: CA2729  No components found for: HGQUANT  No results found for: CEA1 / No results found for: CEA1   No results found for: AFPTUMOR  No results found for: CHROMOGRNA  No results found for: KPAFRELGTCHN, LAMBDASER, KAPLAMBRATIO (kappa/lambda light chains)  No results found for: HGBA, HGBA2QUANT, HGBFQUANT, HGBSQUAN (Hemoglobinopathy evaluation)   No results found for: LDH  No results found for: IRON, TIBC, IRONPCTSAT (Iron and TIBC)  No results found for: FERRITIN  Urinalysis    Component Value Date/Time   COLORURINE YELLOW 06/21/2019 1030   APPEARANCEUR CLEAR 06/21/2019 1030   LABSPEC 1.015 06/21/2019 1030   PHURINE 5.0 06/21/2019  1030   GLUCOSEU NEGATIVE 06/21/2019 1030   HGBUR NEGATIVE 06/21/2019 1030   BILIRUBINUR NEGATIVE 06/21/2019 1030   KETONESUR NEGATIVE 06/21/2019 1030   PROTEINUR NEGATIVE 06/21/2019 1030   UROBILINOGEN 0.2 07/05/2012 1610   NITRITE NEGATIVE 06/21/2019 1030   LEUKOCYTESUR TRACE (A) 06/21/2019 1030     STUDIES: CT Chest W Contrast  Result Date: 06/10/2019 CLINICAL DATA:  New diagnosis bilateral breast cancer. Staging evaluation. EXAM: CT CHEST, ABDOMEN, AND PELVIS WITH CONTRAST TECHNIQUE: Multidetector CT imaging of the chest, abdomen and pelvis was performed following the  standard protocol during bolus administration of intravenous contrast. CONTRAST:  67m OMNIPAQUE IOHEXOL 300 MG/ML  SOLN COMPARISON:  02/21/2007 chest CT. 04/04/2017 CT abdomen/pelvis. FINDINGS: CT CHEST FINDINGS Cardiovascular: Normal heart size. No significant pericardial effusion/thickening. Atherosclerotic nonaneurysmal thoracic aorta. Normal caliber pulmonary arteries. No central pulmonary emboli. Mediastinum/Nodes: No discrete thyroid nodules. Unremarkable esophagus. Several enlarged left axillary nodes, largest with short axis diameter 1.6 cm (series 2/image 30). Enlarged left retropectoral nodes up to 1.0 cm (series 2/image 15). Enlarged left supraclavicular nodes, largest 1.2 cm (series 2/image 12). No pathologically enlarged right axillary nodes. No mediastinal or hilar adenopathy. Lungs/Pleura: No pneumothorax. No pleural effusion. No acute consolidative airspace disease or lung masses. Ground-glass 6 mm anterior left lower lobe pulmonary nodule (series 4/image 114), not definitely seen on prior chest CT. No additional significant pulmonary nodules. Musculoskeletal: No aggressive appearing focal osseous lesions. Moderate thoracic spondylosis. Enhancing 4.1 x 2.8 cm outer left breast mass (series 2/image 36). CT ABDOMEN PELVIS FINDINGS Hepatobiliary: Normal liver with no liver mass. Normal gallbladder with no radiopaque  cholelithiasis. No biliary ductal dilatation. Pancreas: Normal, with no mass or duct dilation. Spleen: Normal size. No mass. Adrenals/Urinary Tract: Normal adrenals. No hydronephrosis. Scattered hypodense subcentimeter renal cortical lesions in the posterior mid to upper right kidney, too small to characterize, requiring no follow-up. No left renal lesions. Small cystocele. Otherwise normal bladder. Stomach/Bowel: Normal non-distended stomach. Normal caliber small bowel with no small bowel wall thickening. Appendix not discretely visualized. Oral contrast transits to the colon. Mild sigmoid diverticulosis with no large bowel wall thickening or significant pericolonic fat stranding. Vascular/Lymphatic: Atherosclerotic nonaneurysmal abdominal aorta. Patent portal, splenic, hepatic and renal veins. No pathologically enlarged lymph nodes in the abdomen or pelvis. Reproductive: Status post hysterectomy, with no abnormal findings at the vaginal cuff. No adnexal mass. Other: No pneumoperitoneum, ascites or focal fluid collection. Musculoskeletal: No aggressive appearing focal osseous lesions. Moderate lumbar spondylosis. IMPRESSION: 1. Enhancing 4.1 cm outer left breast mass compatible with primary breast malignancy. 2. Left axillary, left retropectoral and left supraclavicular lymphadenopathy compatible with nodal metastatic disease. 3. Ground-glass 6 mm anterior left lower lobe pulmonary nodule, not definitely seen on prior chest CT, recommend attention on follow-up chest CT in 1 year. 4. No evidence of metastatic disease in the abdomen or pelvis. 5. Aortic Atherosclerosis (ICD10-I70.0). Additional chronic findings as detailed. Electronically Signed   By: JIlona SorrelM.D.   On: 06/10/2019 08:26   CT Abdomen Pelvis W Contrast  Result Date: 06/10/2019 CLINICAL DATA:  New diagnosis bilateral breast cancer. Staging evaluation. EXAM: CT CHEST, ABDOMEN, AND PELVIS WITH CONTRAST TECHNIQUE: Multidetector CT imaging of the  chest, abdomen and pelvis was performed following the standard protocol during bolus administration of intravenous contrast. CONTRAST:  819mOMNIPAQUE IOHEXOL 300 MG/ML  SOLN COMPARISON:  02/21/2007 chest CT. 04/04/2017 CT abdomen/pelvis. FINDINGS: CT CHEST FINDINGS Cardiovascular: Normal heart size. No significant pericardial effusion/thickening. Atherosclerotic nonaneurysmal thoracic aorta. Normal caliber pulmonary arteries. No central pulmonary emboli. Mediastinum/Nodes: No discrete thyroid nodules. Unremarkable esophagus. Several enlarged left axillary nodes, largest with short axis diameter 1.6 cm (series 2/image 30). Enlarged left retropectoral nodes up to 1.0 cm (series 2/image 15). Enlarged left supraclavicular nodes, largest 1.2 cm (series 2/image 12). No pathologically enlarged right axillary nodes. No mediastinal or hilar adenopathy. Lungs/Pleura: No pneumothorax. No pleural effusion. No acute consolidative airspace disease or lung masses. Ground-glass 6 mm anterior left lower lobe pulmonary nodule (series 4/image 114), not definitely seen on prior chest CT. No additional significant pulmonary nodules.  Musculoskeletal: No aggressive appearing focal osseous lesions. Moderate thoracic spondylosis. Enhancing 4.1 x 2.8 cm outer left breast mass (series 2/image 36). CT ABDOMEN PELVIS FINDINGS Hepatobiliary: Normal liver with no liver mass. Normal gallbladder with no radiopaque cholelithiasis. No biliary ductal dilatation. Pancreas: Normal, with no mass or duct dilation. Spleen: Normal size. No mass. Adrenals/Urinary Tract: Normal adrenals. No hydronephrosis. Scattered hypodense subcentimeter renal cortical lesions in the posterior mid to upper right kidney, too small to characterize, requiring no follow-up. No left renal lesions. Small cystocele. Otherwise normal bladder. Stomach/Bowel: Normal non-distended stomach. Normal caliber small bowel with no small bowel wall thickening. Appendix not discretely  visualized. Oral contrast transits to the colon. Mild sigmoid diverticulosis with no large bowel wall thickening or significant pericolonic fat stranding. Vascular/Lymphatic: Atherosclerotic nonaneurysmal abdominal aorta. Patent portal, splenic, hepatic and renal veins. No pathologically enlarged lymph nodes in the abdomen or pelvis. Reproductive: Status post hysterectomy, with no abnormal findings at the vaginal cuff. No adnexal mass. Other: No pneumoperitoneum, ascites or focal fluid collection. Musculoskeletal: No aggressive appearing focal osseous lesions. Moderate lumbar spondylosis. IMPRESSION: 1. Enhancing 4.1 cm outer left breast mass compatible with primary breast malignancy. 2. Left axillary, left retropectoral and left supraclavicular lymphadenopathy compatible with nodal metastatic disease. 3. Ground-glass 6 mm anterior left lower lobe pulmonary nodule, not definitely seen on prior chest CT, recommend attention on follow-up chest CT in 1 year. 4. No evidence of metastatic disease in the abdomen or pelvis. 5. Aortic Atherosclerosis (ICD10-I70.0). Additional chronic findings as detailed. Electronically Signed   By: Ilona Sorrel M.D.   On: 06/10/2019 08:26     ELIGIBLE FOR AVAILABLE RESEARCH PROTOCOL: AET  ASSESSMENT: 86 y.o. [place] woman status post bilateral breast biopsies on 05/17/2019 as follows:  (a) on the left upper outer quadrant, a clinical T2 N2, stage IIIA invasive ductal carcinoma,grade 2 or 3, estrogen receptor positive, progesterone receptor negative, with no HER-2 amplification and an MIB-1 of 12%  (b) on the right overlapping sites, 2 morphologically distinct invasive ductal carcinomas, both clinically T1c N0, stage IA, grade 1 or 2, estrogen receptor positive, progesterone receptor negative, with no HER-2 amplification and an MIB-1 of 5%  (c) CT of the chest abdomen and pelvis 06/10/2019 shows left axillary, retropectoral and supraclavicular lymph nodes, and a 4.1 cm left  breast mass; no right axillary adenopathy; 0.6 cm nonspecific left lower lobe pulmonary nodule; no liver or bone lesions.  (d) bone scan 06/26/2019    (1) neoadjuvant anastrozole started 05/28/2019, palbociclib added 05/30/2018 2125 mg a day, 21 on 7 off  (a) palbociclib dose decreased cycle 2 (May 2021) to 100 mg daily, 21/7  (2) genetics testing pending  (3) definitive surgery to follow  (4) adjuvant radiation   PLAN: Aailyah tolerated her first cycle of anastrozole and palbociclib without any unusual side effects.  Her counts are lower than I would like and so we are dropping the palbociclib dose 200 mg daily starting with the next cycle.  She was given some antibiotics by her primary care physician but she still has symptoms and we repeated a UA today which shows significant leukocytosis.  Am going to give her a week of low-dose Cipro which I think will clear the issue but she will let us know if not  She had been scheduled for a genetics visit but the date turned out to be not convenient for the daughter who is the driver and in any case they would prefer to do it virtually so I  have contacted genetics and they will be contacting the daughter to reschedule.  They can obtain the labs at the next visit with me which will be June 3  Total encounter time 35 minutes.Sarajane Jews C. Collen Hostler, MD 06/21/2019 4:22 PM Medical Oncology and Hematology [facility] [place], [place] [address] Tel. [phone]    Fax. [phone]   This document serves as a record of services personally performed by Lurline Del, MD. It was created on his behalf by Wilburn Mylar, a trained medical scribe. The creation of this record is based on the scribe's personal observations and the provider's statements to them.   I, Lurline Del MD, have reviewed the above documentation for accuracy and completeness, and I agree with the above.   *Total Encounter Time as defined by the  Centers for Medicare and Medicaid Services includes, in addition to the face-to-face time of a patient visit (documented in the note above) non-face-to-face time: obtaining and reviewing outside history, ordering and reviewing medications, tests or procedures, care coordination (communications with other health care professionals or caregivers) and documentation in the medical record.

## 2019-06-21 ENCOUNTER — Inpatient Hospital Stay (HOSPITAL_BASED_OUTPATIENT_CLINIC_OR_DEPARTMENT_OTHER): Payer: Medicare HMO | Admitting: Oncology

## 2019-06-21 ENCOUNTER — Inpatient Hospital Stay: Payer: Medicare HMO | Attending: Oncology

## 2019-06-21 ENCOUNTER — Other Ambulatory Visit: Payer: Self-pay

## 2019-06-21 ENCOUNTER — Encounter: Payer: Self-pay | Admitting: Oncology

## 2019-06-21 VITALS — BP 127/93 | HR 67 | Temp 98.7°F | Resp 17 | Ht 61.0 in | Wt 118.0 lb

## 2019-06-21 DIAGNOSIS — C50811 Malignant neoplasm of overlapping sites of right female breast: Secondary | ICD-10-CM | POA: Insufficient documentation

## 2019-06-21 DIAGNOSIS — Z7982 Long term (current) use of aspirin: Secondary | ICD-10-CM | POA: Diagnosis not present

## 2019-06-21 DIAGNOSIS — C773 Secondary and unspecified malignant neoplasm of axilla and upper limb lymph nodes: Secondary | ICD-10-CM | POA: Insufficient documentation

## 2019-06-21 DIAGNOSIS — I10 Essential (primary) hypertension: Secondary | ICD-10-CM | POA: Insufficient documentation

## 2019-06-21 DIAGNOSIS — C50412 Malignant neoplasm of upper-outer quadrant of left female breast: Secondary | ICD-10-CM | POA: Insufficient documentation

## 2019-06-21 DIAGNOSIS — M1990 Unspecified osteoarthritis, unspecified site: Secondary | ICD-10-CM | POA: Insufficient documentation

## 2019-06-21 DIAGNOSIS — C50411 Malignant neoplasm of upper-outer quadrant of right female breast: Secondary | ICD-10-CM

## 2019-06-21 DIAGNOSIS — Z170 Estrogen receptor positive status [ER+]: Secondary | ICD-10-CM | POA: Diagnosis not present

## 2019-06-21 DIAGNOSIS — R911 Solitary pulmonary nodule: Secondary | ICD-10-CM | POA: Insufficient documentation

## 2019-06-21 DIAGNOSIS — Z79811 Long term (current) use of aromatase inhibitors: Secondary | ICD-10-CM | POA: Insufficient documentation

## 2019-06-21 DIAGNOSIS — Z79899 Other long term (current) drug therapy: Secondary | ICD-10-CM | POA: Diagnosis not present

## 2019-06-21 LAB — CMP (CANCER CENTER ONLY)
ALT: 25 U/L (ref 0–44)
AST: 21 U/L (ref 15–41)
Albumin: 3.8 g/dL (ref 3.5–5.0)
Alkaline Phosphatase: 55 U/L (ref 38–126)
Anion gap: 14 (ref 5–15)
BUN: 24 mg/dL — ABNORMAL HIGH (ref 8–23)
CO2: 23 mmol/L (ref 22–32)
Calcium: 8.9 mg/dL (ref 8.9–10.3)
Chloride: 106 mmol/L (ref 98–111)
Creatinine: 1.18 mg/dL — ABNORMAL HIGH (ref 0.44–1.00)
GFR, Est AFR Am: 48 mL/min — ABNORMAL LOW (ref >60)
GFR, Estimated: 42 mL/min — ABNORMAL LOW (ref >60)
Glucose, Bld: 82 mg/dL (ref 70–99)
Potassium: 4.1 mmol/L (ref 3.5–5.1)
Sodium: 143 mmol/L (ref 135–145)
Total Bilirubin: 0.6 mg/dL (ref 0.3–1.2)
Total Protein: 6.7 g/dL (ref 6.5–8.1)

## 2019-06-21 LAB — CBC WITH DIFFERENTIAL (CANCER CENTER ONLY)
Abs Immature Granulocytes: 0 10*3/uL (ref 0.00–0.07)
Basophils Absolute: 0 10*3/uL (ref 0.0–0.1)
Basophils Relative: 2 %
Eosinophils Absolute: 0.1 10*3/uL (ref 0.0–0.5)
Eosinophils Relative: 3 %
HCT: 28.9 % — ABNORMAL LOW (ref 36.0–46.0)
Hemoglobin: 9.2 g/dL — ABNORMAL LOW (ref 12.0–15.0)
Immature Granulocytes: 0 %
Lymphocytes Relative: 55 %
Lymphs Abs: 1.1 10*3/uL (ref 0.7–4.0)
MCH: 28.4 pg (ref 26.0–34.0)
MCHC: 31.8 g/dL (ref 30.0–36.0)
MCV: 89.2 fL (ref 80.0–100.0)
Monocytes Absolute: 0.1 10*3/uL (ref 0.1–1.0)
Monocytes Relative: 5 %
Neutro Abs: 0.7 10*3/uL — ABNORMAL LOW (ref 1.7–7.7)
Neutrophils Relative %: 35 %
Platelet Count: 94 10*3/uL — ABNORMAL LOW (ref 150–400)
RBC: 3.24 MIL/uL — ABNORMAL LOW (ref 3.87–5.11)
RDW: 13.8 % (ref 11.5–15.5)
WBC Count: 1.9 10*3/uL — ABNORMAL LOW (ref 4.0–10.5)
nRBC: 0 % (ref 0.0–0.2)

## 2019-06-21 LAB — URINALYSIS, COMPLETE (UACMP) WITH MICROSCOPIC
Bilirubin Urine: NEGATIVE
Glucose, UA: NEGATIVE mg/dL
Hgb urine dipstick: NEGATIVE
Ketones, ur: NEGATIVE mg/dL
Nitrite: NEGATIVE
Protein, ur: NEGATIVE mg/dL
Specific Gravity, Urine: 1.015 (ref 1.005–1.030)
pH: 5 (ref 5.0–8.0)

## 2019-06-21 MED ORDER — PALBOCICLIB 100 MG PO CAPS: 100.0000 mg | ORAL_CAPSULE | Freq: Every day | ORAL | 6 refills | Status: DC

## 2019-06-21 MED ORDER — CIPROFLOXACIN HCL 250 MG PO TABS: 250.0000 mg | ORAL_TABLET | Freq: Two times a day (BID) | ORAL | 0 refills | Status: DC

## 2019-06-21 NOTE — Progress Notes (Signed)
Met with patient/family member at registration to introduce myself as Arboriculturist and to offer available resources.  Discussed one-time $1000 Radio broadcast assistant to assist with personal expenses while going through treatment.  Gave them my card if interested in applying and for any additional financial questions or concerns.

## 2019-06-24 ENCOUNTER — Other Ambulatory Visit: Payer: Self-pay | Admitting: Pharmacist

## 2019-06-24 ENCOUNTER — Encounter: Payer: Medicare HMO | Admitting: Licensed Clinical Social Worker

## 2019-06-24 ENCOUNTER — Telehealth: Payer: Self-pay | Admitting: Oncology

## 2019-06-24 ENCOUNTER — Encounter: Payer: Self-pay | Admitting: *Deleted

## 2019-06-24 DIAGNOSIS — C50412 Malignant neoplasm of upper-outer quadrant of left female breast: Secondary | ICD-10-CM

## 2019-06-24 DIAGNOSIS — Z170 Estrogen receptor positive status [ER+]: Secondary | ICD-10-CM

## 2019-06-24 MED ORDER — PALBOCICLIB 100 MG PO TABS: 100.0000 mg | ORAL_TABLET | Freq: Every day | ORAL | 6 refills | Status: DC

## 2019-06-24 NOTE — Telephone Encounter (Signed)
Scheduled appts per 5/7 los. Pt's daughter confirmed appt date and time.

## 2019-06-25 ENCOUNTER — Inpatient Hospital Stay: Payer: Medicare HMO

## 2019-06-25 NOTE — Progress Notes (Signed)
Nutrition Assessment   Reason for Assessment: Patient identified on Malnutrition Screening report for weight loss and poor appetite   ASSESSMENT:  86 year old female with newly diagnosed bilateral breast cancer.  Past medical history of HTN.  Patient has been started on anastrozole and palbociclib.    Spoke with daughter [name] via phone. Daughter states that patient's appetite has decreased.  Daughter reports that she eats when she gets hungry.  She has never been a big breakfast eater.  Daughter reports that she took her a fish plate last night and says she will still be eating on it today.  Daughter and other family take her food.  Patient does not cook anymore.  Daughter says that she drinks mostly water and ensure.  Unsure how often she drinks it.        Medications: calcium and vit d, prilosec, vit B 12, mag ox   Labs: BUN 24, creatinine 1.18   Anthropometrics:   Height: 61 inches Weight: 118 lb (5/7) Noted 114 lb on 05/28/19 150 lb 07/14/2014 BMI: 22  Daughter unsure what has contributed to weight loss other than decreased appetite   NUTRITION DIAGNOSIS: unable to determine at this time.    INTERVENTION:  Discussed importance of good nutrition and weight maintenance.  Encouraged good sources of protein and listed foods high in protein.  Encouraged ensure/boost shake daily for added nutrition Daughter given contact information   MONITORING, EVALUATION, GOAL: weight trends, intake   Next Visit: June 15th phone f/u  Aryahna Spagna B. Zenia Resides, [place], [place] Registered Dietitian [phone] (pager)

## 2019-06-26 ENCOUNTER — Encounter (HOSPITAL_COMMUNITY)
Admission: RE | Admit: 2019-06-26 | Discharge: 2019-06-26 | Disposition: A | Discharge status: Home / Self Care | Payer: Medicare HMO | Source: Ambulatory Visit | Attending: Oncology | Admitting: Oncology

## 2019-06-26 ENCOUNTER — Other Ambulatory Visit: Payer: Self-pay

## 2019-06-26 DIAGNOSIS — C50411 Malignant neoplasm of upper-outer quadrant of right female breast: Secondary | ICD-10-CM | POA: Insufficient documentation

## 2019-06-26 DIAGNOSIS — J8410 Pulmonary fibrosis, unspecified: Secondary | ICD-10-CM

## 2019-06-26 DIAGNOSIS — M1990 Unspecified osteoarthritis, unspecified site: Secondary | ICD-10-CM

## 2019-06-26 DIAGNOSIS — Z170 Estrogen receptor positive status [ER+]: Secondary | ICD-10-CM | POA: Diagnosis not present

## 2019-06-26 DIAGNOSIS — C50412 Malignant neoplasm of upper-outer quadrant of left female breast: Secondary | ICD-10-CM | POA: Insufficient documentation

## 2019-06-26 DIAGNOSIS — C50811 Malignant neoplasm of overlapping sites of right female breast: Secondary | ICD-10-CM

## 2019-06-26 DIAGNOSIS — C50919 Malignant neoplasm of unspecified site of unspecified female breast: Secondary | ICD-10-CM | POA: Diagnosis not present

## 2019-06-26 MED ORDER — TECHNETIUM TC 99M MEDRONATE IV KIT
18.9000 | PACK | Freq: Once | INTRAVENOUS | Status: AC | PRN
  Administered 2019-06-26: 18.9 via INTRAVENOUS

## 2019-06-27 ENCOUNTER — Encounter: Payer: Self-pay | Admitting: *Deleted

## 2019-07-16 ENCOUNTER — Encounter: Payer: Self-pay | Admitting: *Deleted

## 2019-07-18 ENCOUNTER — Inpatient Hospital Stay: Payer: Medicare HMO | Admitting: Oncology

## 2019-07-18 ENCOUNTER — Inpatient Hospital Stay: Payer: Medicare HMO

## 2019-07-22 ENCOUNTER — Other Ambulatory Visit: Payer: Self-pay | Admitting: *Deleted

## 2019-07-22 ENCOUNTER — Other Ambulatory Visit: Payer: Self-pay

## 2019-07-22 DIAGNOSIS — Z170 Estrogen receptor positive status [ER+]: Secondary | ICD-10-CM

## 2019-07-22 NOTE — Progress Notes (Signed)
error 

## 2019-07-23 ENCOUNTER — Inpatient Hospital Stay (HOSPITAL_BASED_OUTPATIENT_CLINIC_OR_DEPARTMENT_OTHER): Payer: Medicare HMO | Admitting: Oncology

## 2019-07-23 ENCOUNTER — Encounter: Payer: Self-pay | Admitting: *Deleted

## 2019-07-23 ENCOUNTER — Inpatient Hospital Stay: Payer: Medicare HMO | Attending: Oncology

## 2019-07-23 ENCOUNTER — Other Ambulatory Visit: Payer: Self-pay

## 2019-07-23 VITALS — BP 121/64 | HR 77 | Temp 97.8°F | Resp 17 | Ht 61.0 in | Wt 120.5 lb

## 2019-07-23 DIAGNOSIS — C50811 Malignant neoplasm of overlapping sites of right female breast: Secondary | ICD-10-CM | POA: Diagnosis not present

## 2019-07-23 DIAGNOSIS — C50412 Malignant neoplasm of upper-outer quadrant of left female breast: Secondary | ICD-10-CM | POA: Insufficient documentation

## 2019-07-23 DIAGNOSIS — I10 Essential (primary) hypertension: Secondary | ICD-10-CM | POA: Diagnosis not present

## 2019-07-23 DIAGNOSIS — Z170 Estrogen receptor positive status [ER+]: Secondary | ICD-10-CM

## 2019-07-23 DIAGNOSIS — Z7982 Long term (current) use of aspirin: Secondary | ICD-10-CM | POA: Diagnosis not present

## 2019-07-23 DIAGNOSIS — Z79811 Long term (current) use of aromatase inhibitors: Secondary | ICD-10-CM | POA: Diagnosis not present

## 2019-07-23 DIAGNOSIS — M1990 Unspecified osteoarthritis, unspecified site: Secondary | ICD-10-CM | POA: Insufficient documentation

## 2019-07-23 DIAGNOSIS — Z79899 Other long term (current) drug therapy: Secondary | ICD-10-CM | POA: Diagnosis not present

## 2019-07-23 DIAGNOSIS — C773 Secondary and unspecified malignant neoplasm of axilla and upper limb lymph nodes: Secondary | ICD-10-CM | POA: Insufficient documentation

## 2019-07-23 DIAGNOSIS — K5900 Constipation, unspecified: Secondary | ICD-10-CM | POA: Diagnosis not present

## 2019-07-23 DIAGNOSIS — M545 Low back pain: Secondary | ICD-10-CM | POA: Diagnosis not present

## 2019-07-23 LAB — CMP (CANCER CENTER ONLY)
ALT: 9 U/L (ref 0–44)
AST: 15 U/L (ref 15–41)
Albumin: 4.1 g/dL (ref 3.5–5.0)
Alkaline Phosphatase: 43 U/L (ref 38–126)
Anion gap: 9 (ref 5–15)
BUN: 21 mg/dL (ref 8–23)
CO2: 29 mmol/L (ref 22–32)
Calcium: 10 mg/dL (ref 8.9–10.3)
Chloride: 105 mmol/L (ref 98–111)
Creatinine: 0.99 mg/dL (ref 0.44–1.00)
GFR, Est AFR Am: 60 mL/min — ABNORMAL LOW (ref >60)
GFR, Estimated: 52 mL/min — ABNORMAL LOW (ref >60)
Glucose, Bld: 106 mg/dL — ABNORMAL HIGH (ref 70–99)
Potassium: 4.4 mmol/L (ref 3.5–5.1)
Sodium: 143 mmol/L (ref 135–145)
Total Bilirubin: 0.8 mg/dL (ref 0.3–1.2)
Total Protein: 7.1 g/dL (ref 6.5–8.1)

## 2019-07-23 LAB — CBC WITH DIFFERENTIAL (CANCER CENTER ONLY)
Abs Immature Granulocytes: 0.01 10*3/uL (ref 0.00–0.07)
Basophils Absolute: 0.1 10*3/uL (ref 0.0–0.1)
Basophils Relative: 2 %
Eosinophils Absolute: 0.1 10*3/uL (ref 0.0–0.5)
Eosinophils Relative: 2 %
HCT: 29.6 % — ABNORMAL LOW (ref 36.0–46.0)
Hemoglobin: 9.5 g/dL — ABNORMAL LOW (ref 12.0–15.0)
Immature Granulocytes: 0 %
Lymphocytes Relative: 46 %
Lymphs Abs: 1.2 10*3/uL (ref 0.7–4.0)
MCH: 30.9 pg (ref 26.0–34.0)
MCHC: 32.1 g/dL (ref 30.0–36.0)
MCV: 96.4 fL (ref 80.0–100.0)
Monocytes Absolute: 0.4 10*3/uL (ref 0.1–1.0)
Monocytes Relative: 13 %
Neutro Abs: 1 10*3/uL — ABNORMAL LOW (ref 1.7–7.7)
Neutrophils Relative %: 37 %
Platelet Count: 133 10*3/uL — ABNORMAL LOW (ref 150–400)
RBC: 3.07 MIL/uL — ABNORMAL LOW (ref 3.87–5.11)
RDW: 20.9 % — ABNORMAL HIGH (ref 11.5–15.5)
WBC Count: 2.7 10*3/uL — ABNORMAL LOW (ref 4.0–10.5)
nRBC: 0 % (ref 0.0–0.2)

## 2019-07-23 NOTE — Progress Notes (Signed)
[place]  [phone] Fax:(336) [phone]     ID: Michelle [name] DOB: 12/09/1930  MR#: [id number]  YOV#:[id number]  Patient Care Team: [name], MD as PCP - General (Internal Medicine) Simren Popson, Virgie Dad, MD as Consulting Physician (Oncology) Donnie Mesa, MD as Consulting Physician (General Surgery) Mauro Kaufmann, RN as Oncology Nurse Navigator Rockwell Germany, RN as Oncology Nurse Navigator Chauncey Cruel, MD OTHER MD:  CHIEF COMPLAINT: Bilateral estrogen receptor positive breast cancers  CURRENT TREATMENT: neoadjuvant anti-estrogens and palbociclib   INTERVAL HISTORY: Michelle [name] returns today for follow up of her bilateral estrogen receptor positive breast cancers accompanied by her daughter Michelle [name] and her son Michelle [name].  She was started on neoadjuvant anastrozole on 05/28/2019 and palbociclib was added soon after.  Her palbociclib dose dropped to 100 mg because of to low white counts.  She herself did not notice any difference in symptoms and she really did not have any obvious symptoms related to this treatment.  In particular she is having neither problems with hot flashes nor vaginal dryness nor nausea.  She does have a slight altered taste.  Since her last visit, she underwent bone scan on 06/26/2019 showing no findings suggestive of metastatic disease.  Prior CT scans of the chest abdomen and pelvis on 06/07/2019 showed no evidence of metastatic disease.  There was a 0.6 cm left lower lobe pulmonary nodule which will bear watching.  There was also aortic atherosclerosis.   REVIEW OF SYSTEMS: Any complains of low back pain.  She is not walking very much around the house but she tends to hold onto things when she does.  She does not think she needs a cane or walker and there have been no falls.  She drinks plenty of water during the day, 2-3 bottles.  Despite that she is mildly constipated and she takes juices and bananas for that.  She has less  pain she says in her breast and the breast seems softer.  Detailed review of systems today was otherwise stable.   HISTORY OF CURRENT ILLNESS: From the original intake note:  Michelle [name] herself palpated a left breast mass around January 2021, as she recalls. She underwent bilateral diagnostic mammography with tomography and bilateral breast ultrasonography at [facility] on 05/17/2019 showing: LEFT BREAST: breast density category B; 4.9 cm spiculated left breast mass extending from 12-2 o'clock, as well as to and likely involving overlying skin/nipple (with associated retraction); at least 8 abnormal left axillary lymph nodes;   RIGHT BREAST: two suspicious right breast masses, 1.2 cm at 12 o'clock and 2.5 cm at 9:30, approximately 2.6 cm apart; negative right axilla.  Accordingly that same day, she proceeded to biopsy of the bilateral breast areas in question. The pathology from this procedure ([id number]) showed:  1. Left breast, 12-2 o'clock  - invasive and in situ mammary carcinoma, grade 2, e-cadherin positive   - estrogen receptor, 100% positive with strong staining intensity and progesterone receptor, 0% negative. Proliferation marker Ki67 at 12%. HER2 negative by immunohistochemistry (1+). 4. Lymph node, left axilla  - metastatic carcinoma with extra capsular extension (1/1)  2. Right breast, 9:30  - invasive ductal carcinoma, grade 1  - morphologically dissimilar from left breast carcinoma  - estrogen receptor, 100% positive with strong staining intensity and progesterone receptor, 0% negative. Proliferation marker Ki67 at 5%. HER2 negative by immunohistochemistry (1+). 3. Right breast, 12 o'clock  - invasive and in situ mammary carcinoma, grade 1-2   -  vascular calcifications  - morphologically dissimilar from other right breast carcinoma  - estrogen receptor, 100% positive with strong staining intensity and progesterone receptor, 0% negative. Proliferation marker Ki67 at  5%. HER2 negative by immunohistochemistry (1+).   The patient's subsequent history is as detailed below.   PAST MEDICAL HISTORY: Past Medical History:  Diagnosis Date   Arthritis    Hypertension     PAST SURGICAL HISTORY: Past Surgical History:  Procedure Laterality Date   SHOULDER SURGERY     Right  Status post total abdominal hysterectomy with bilateral salpingo-oophorectomy, status post appendectomy, status post tonsillectomy.   FAMILY HISTORY: Family History  Adopted: Yes  The patient is adopted and has no information on her parents siblings or other blood relatives   GYNECOLOGIC HISTORY:  No LMP recorded. Patient is postmenopausal. Menarche: Does not recall Age at first live birth: 86 years old [place] P 4 LMP 73s Hysterectomy?  Yes BSO?  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  Her children are Michelle [name], who lives in [place] and works for [employer] Michelle [name]) lives in [place] and runs his own business, and Michelle [name]") who lives in [place] and works for [employer]  He is also training his children to box.  Son [name] died from hepatitis C at age 8.  The patient has 11 grandchildren and more great grandchildren and great great-grandchildren than she can count.  She attends a local [employer]: Not in place.  At the 05/28/2019 visit the patient was given the appropriate documents to complete and notarized at her discretion.   HEALTH MAINTENANCE: Social History   Tobacco Use   Smoking status: Never Smoker  Substance Use Topics   Alcohol use: No   Drug use: No    Frequency: 3.0 times per week     Colonoscopy: Never  PAP: Remote  Bone density: Never   No Known Allergies  Current Outpatient Medications  Medication Sig Dispense Refill   anastrozole (ARIMIDEX) 1 MG tablet Take 1 tablet (1 mg total) by mouth  daily. 90 tablet 4   aspirin 325 MG tablet Take 325 mg by mouth 2 (two) times daily.     Calcium Carbonate-Vit D-Min (CALCIUM 600 + MINERALS) 600-200 MG-UNIT TABS Take 1-2 tablets by mouth daily. Take 2 tablets (1200 mg) in the am and Take 1 tablet (600 mg) at the pm.     ciprofloxacin (CIPRO) 250 MG tablet Take 1 tablet (250 mg total) by mouth 2 (two) times daily. 14 tablet 0   esomeprazole (NEXIUM) 20 MG capsule Take 20 mg by mouth daily as needed (acid reflux).     gabapentin (NEURONTIN) 300 MG capsule Take 300 mg by mouth 3 (three) times daily.     losartan-hydrochlorothiazide (HYZAAR) 100-12.5 MG per tablet Take 1 tablet by mouth daily.     magnesium oxide (MAG-OX) 400 MG tablet Take 400 mg by mouth daily.     omeprazole (PRILOSEC) 20 MG capsule Take 1 capsule (20 mg total) by mouth daily. 30 capsule 0   palbociclib (IBRANCE) 100 MG tablet Take 1 tablet (100 mg total) by mouth daily. Take for 21 days on, 7 days off, repeat every 28 days. 21 tablet 6   pravastatin (PRAVACHOL) 20 MG tablet Take 20 mg by mouth daily.     vitamin B-12 (CYANOCOBALAMIN) 1000 MCG tablet  Take 1,000 mcg by mouth daily.     No current facility-administered medications for this visit.    OBJECTIVE: African-American woman examined in a wheelchair  Vitals:   07/23/19 1554  BP: 121/64  Pulse: 77  Resp: 17  Temp: 97.8 F (36.6 C)  SpO2: 100%     Body mass index is 22.77 kg/m.   Wt Readings from Last 3 Encounters:  07/23/19 120 lb 8 oz (54.7 kg)  06/21/19 118 lb (53.5 kg)  05/28/19 114 lb 6.4 oz (51.9 kg)      ECOG FS:2 - Symptomatic, <50% confined to bed  Sclerae unicteric, EOMs intact Wearing a mask No cervical or supraclavicular adenopathy Lungs no rales or rhonchi, no wheezes Heart regular rate and rhythm, 2/6 systolic murmur Abd soft, nontender, positive bowel sounds MSK kyphosis but no focal spinal tenderness, no upper extremity lymphedema Neuro: nonfocal, well oriented, positive  affect Breasts: The mass in the right breast seems to me slightly smaller and slightly softer.  There is no overlying erythema.  It is easily movable.  The right axilla is benign.  The mass in the left breast is more complex, catheters the skin, it seems to me only slightly different from prior but certainly no worse than before.  The left axilla is stable    Left beast 05/28/2019    Right breast, 05/28/2019    LAB RESULTS:  CMP     Component Value Date/Time   NA 143 07/23/2019 1539   K 4.4 07/23/2019 1539   CL 105 07/23/2019 1539   CO2 29 07/23/2019 1539   GLUCOSE 106 (H) 07/23/2019 1539   BUN 21 07/23/2019 1539   CREATININE 0.99 07/23/2019 1539   CALCIUM 10.0 07/23/2019 1539   PROT 7.1 07/23/2019 1539   ALBUMIN 4.1 07/23/2019 1539   AST 15 07/23/2019 1539   ALT 9 07/23/2019 1539   ALKPHOS 43 07/23/2019 1539   BILITOT 0.8 07/23/2019 1539   GFRNONAA 52 (L) 07/23/2019 1539   GFRAA 60 (L) 07/23/2019 1539    No results found for: TOTALPROTELP, ALBUMINELP, A1GS, A2GS, BETS, BETA2SER, GAMS, MSPIKE, SPEI  Lab Results  Component Value Date   WBC 2.7 (L) 07/23/2019   NEUTROABS 1.0 (L) 07/23/2019   HGB 9.5 (L) 07/23/2019   HCT 29.6 (L) 07/23/2019   MCV 96.4 07/23/2019   PLT 133 (L) 07/23/2019    No results found for: LABCA2  No components found for: NOIBBC488  No results for input(s): INR in the last 168 hours.  No results found for: LABCA2  No results found for: QBV694  No results found for: HWT888  No results found for: KCM034  No results found for: CA2729  No components found for: HGQUANT  No results found for: CEA1 / No results found for: CEA1   No results found for: AFPTUMOR  No results found for: CHROMOGRNA  No results found for: KPAFRELGTCHN, LAMBDASER, KAPLAMBRATIO (kappa/lambda light chains)  No results found for: HGBA, HGBA2QUANT, HGBFQUANT, HGBSQUAN (Hemoglobinopathy evaluation)   No results found for: LDH  No results found for: IRON,  TIBC, IRONPCTSAT (Iron and TIBC)  No results found for: FERRITIN  Urinalysis    Component Value Date/Time   COLORURINE YELLOW 06/21/2019 1030   APPEARANCEUR CLEAR 06/21/2019 1030   LABSPEC 1.015 06/21/2019 1030   PHURINE 5.0 06/21/2019 1030   GLUCOSEU NEGATIVE 06/21/2019 1030   HGBUR NEGATIVE 06/21/2019 [place] 06/21/2019 [place] 06/21/2019 [place] 06/21/2019 1030  UROBILINOGEN 0.2 07/05/2012 1610   NITRITE NEGATIVE 06/21/2019 1030   LEUKOCYTESUR TRACE (A) 06/21/2019 1030     STUDIES: NM Bone Scan Whole Body  Result Date: 06/27/2019 CLINICAL DATA:  Breast cancer staging. New diagnosis. Left breast mass with left adenopathy. EXAM: NUCLEAR MEDICINE WHOLE BODY BONE SCAN TECHNIQUE: Whole body anterior and posterior images were obtained approximately 3 hours after intravenous injection of radiopharmaceutical. RADIOPHARMACEUTICALS:  18.9 mCi Technetium-39mMDP IV COMPARISON:  CT studies 06/07/2019 FINDINGS: Spinal curvature. Lower lumbar degenerative changes. Degenerative changes of the shoulders. Focal activity at the costochondral junction of the right seventh rib, focal and likely to be traumatic. No bone scan findings suggestive of metastatic disease. IMPRESSION: No finding suggestive of metastatic disease. Scoliosis of the spine with lower lumbar degenerative changes. Degenerative changes of the shoulders. Likely traumatic focal activity at the right anterior seventh rib costochondral junction. Electronically Signed   By: MNelson ChimesM.D.   On: 06/27/2019 08:06     ELIGIBLE FOR AVAILABLE RESEARCH PROTOCOL: AET  ASSESSMENT: 86y.o. [place] woman status post bilateral breast biopsies on 05/17/2019 as follows:  (a) on the left upper outer quadrant, a clinical T2 N2, stage IIIA invasive ductal carcinoma,grade 2 or 3, estrogen receptor positive, progesterone receptor negative, with no HER-2 amplification and an MIB-1 of 12%  (b)  on the right overlapping sites, 2 morphologically distinct invasive ductal carcinomas, both clinically T1c N0, stage IA, grade 1 or 2, estrogen receptor positive, progesterone receptor negative, with no HER-2 amplification and an MIB-1 of 5%  (c) CT of the chest abdomen and pelvis 06/10/2019 shows left axillary, retropectoral and supraclavicular lymph nodes, and a 4.1 cm left breast mass; no right axillary adenopathy; 0.6 cm nonspecific left lower lobe pulmonary nodule; no liver or bone lesions.  (d) bone scan 06/26/2019    (1) neoadjuvant anastrozole started 05/28/2019, palbociclib added 05/30/2018 2125 mg a day, 21 on 7 off  (a) palbociclib dose decreased cycle 2 (May 2021) to 100 mg daily, 21/7  (2) genetics testing pending  (3) definitive surgery to follow  (4) adjuvant radiation   PLAN: Michelle [name] tolerating her treatment well, with essentially no side effects that she is aware of.  Her counts were a bit on the low side at 125 mg on the palbociclib dose and we have dropped out to 100.  Her ANC today, on the off week, was 1.0, which is adequate.  Accordingly I am not changing the dose.  However at the next visit we might need to do that and therefore I am asking her to come a little earlier namely on August 16, 2019.  If her ACromwellat that time is less than 1.0 we will drop the dose to 735m  Otherwise we will continue at the current dose  The point of seeing her a little earlier in the cycle is so that she does not get mailed the next month supply of drugs before we have made a decision regarding whether or not to change dose.  This was explained to the patient and her family  The plan then is to continue treatment for 6 months and reassess.  At this point all I feel confident about is that the tumor has not grown and may have shrunk slightly.  I am hoping for there to be a clear change 6 months from now.  They understand that at some point she will still need surgery  Total encounter time 30  minutes.* Sarajane Jews  Everett Graff, MD 07/23/2019 5:27 PM Medical Oncology and Hematology [facility] [place], [place] [address] Tel. [phone]    Fax. [phone]   This document serves as a record of services personally performed by Lurline Del, MD. It was created on his behalf by Wilburn Mylar, a trained medical scribe. The creation of this record is based on the scribe's personal observations and the provider's statements to them.   I, Lurline Del MD, have reviewed the above documentation for accuracy and completeness, and I agree with the above.   *Total Encounter Time as defined by the Centers for Medicare and Medicaid Services includes, in addition to the face-to-face time of a patient visit (documented in the note above) non-face-to-face time: obtaining and reviewing outside history, ordering and reviewing medications, tests or procedures, care coordination (communications with other health care professionals or caregivers) and documentation in the medical record.

## 2019-07-24 ENCOUNTER — Telehealth: Payer: Self-pay | Admitting: Oncology

## 2019-07-24 NOTE — Telephone Encounter (Signed)
Scheduled appts per 6/8 los. Pt's daughter confirmed appt date and time. Mailed appt reminder and calendar per daughter's request.

## 2019-07-29 ENCOUNTER — Telehealth: Payer: Self-pay

## 2019-07-30 ENCOUNTER — Inpatient Hospital Stay: Payer: Medicare HMO

## 2019-07-30 ENCOUNTER — Other Ambulatory Visit: Payer: Self-pay

## 2019-07-30 NOTE — Progress Notes (Signed)
Nutrition Follow-up:  Patient with bilateral breast cancer.  Patient is taking anastrozole and palbociclib.    Spoke with daughter [name] via phone this am for few minutes.  [name] reports that she is in the grocery store.  Appetite is about the same.  "She eats when she is hungry."      Medications: reviewed  Labs: reviewed  Anthropometrics:   Weight has increased to 120 lb from 118 lb on 5/7   INTERVENTION:  Daughter said that she would call RD back at later time.  Daughter has contact information    MONITORING, EVALUATION, GOAL: weight trends, intake   NEXT VISIT: as needed  Wateen Varon B. Zenia Resides, [place], [place] Registered Dietitian [phone] (pager)

## 2019-08-15 ENCOUNTER — Other Ambulatory Visit: Payer: Self-pay

## 2019-08-15 DIAGNOSIS — Z170 Estrogen receptor positive status [ER+]: Secondary | ICD-10-CM

## 2019-08-16 ENCOUNTER — Other Ambulatory Visit: Payer: Self-pay

## 2019-08-16 ENCOUNTER — Inpatient Hospital Stay: Payer: Medicare HMO | Attending: Adult Health | Admitting: Adult Health

## 2019-08-16 ENCOUNTER — Encounter: Payer: Self-pay | Admitting: Adult Health

## 2019-08-16 ENCOUNTER — Inpatient Hospital Stay: Payer: Medicare HMO

## 2019-08-16 VITALS — BP 135/74 | HR 63 | Temp 97.9°F | Resp 18 | Ht 61.0 in | Wt 121.0 lb

## 2019-08-16 DIAGNOSIS — Z170 Estrogen receptor positive status [ER+]: Secondary | ICD-10-CM

## 2019-08-16 DIAGNOSIS — C50412 Malignant neoplasm of upper-outer quadrant of left female breast: Secondary | ICD-10-CM | POA: Diagnosis not present

## 2019-08-16 DIAGNOSIS — R911 Solitary pulmonary nodule: Secondary | ICD-10-CM | POA: Diagnosis not present

## 2019-08-16 DIAGNOSIS — Z79899 Other long term (current) drug therapy: Secondary | ICD-10-CM | POA: Insufficient documentation

## 2019-08-16 DIAGNOSIS — Z923 Personal history of irradiation: Secondary | ICD-10-CM | POA: Diagnosis not present

## 2019-08-16 DIAGNOSIS — M1990 Unspecified osteoarthritis, unspecified site: Secondary | ICD-10-CM | POA: Diagnosis not present

## 2019-08-16 DIAGNOSIS — M549 Dorsalgia, unspecified: Secondary | ICD-10-CM | POA: Insufficient documentation

## 2019-08-16 DIAGNOSIS — Z79811 Long term (current) use of aromatase inhibitors: Secondary | ICD-10-CM | POA: Diagnosis not present

## 2019-08-16 DIAGNOSIS — I10 Essential (primary) hypertension: Secondary | ICD-10-CM | POA: Diagnosis not present

## 2019-08-16 DIAGNOSIS — Z7982 Long term (current) use of aspirin: Secondary | ICD-10-CM | POA: Insufficient documentation

## 2019-08-16 DIAGNOSIS — C50811 Malignant neoplasm of overlapping sites of right female breast: Secondary | ICD-10-CM | POA: Diagnosis not present

## 2019-08-16 LAB — CBC WITH DIFFERENTIAL (CANCER CENTER ONLY)
Abs Immature Granulocytes: 0 10*3/uL (ref 0.00–0.07)
Basophils Absolute: 0.1 10*3/uL (ref 0.0–0.1)
Basophils Relative: 2 %
Eosinophils Absolute: 0.1 10*3/uL (ref 0.0–0.5)
Eosinophils Relative: 2 %
HCT: 30.7 % — ABNORMAL LOW (ref 36.0–46.0)
Hemoglobin: 9.7 g/dL — ABNORMAL LOW (ref 12.0–15.0)
Immature Granulocytes: 0 %
Lymphocytes Relative: 30 %
Lymphs Abs: 0.9 10*3/uL (ref 0.7–4.0)
MCH: 31.2 pg (ref 26.0–34.0)
MCHC: 31.6 g/dL (ref 30.0–36.0)
MCV: 98.7 fL (ref 80.0–100.0)
Monocytes Absolute: 0.2 10*3/uL (ref 0.1–1.0)
Monocytes Relative: 6 %
Neutro Abs: 1.8 10*3/uL (ref 1.7–7.7)
Neutrophils Relative %: 60 %
Platelet Count: 240 10*3/uL (ref 150–400)
RBC: 3.11 MIL/uL — ABNORMAL LOW (ref 3.87–5.11)
WBC Count: 3 10*3/uL — ABNORMAL LOW (ref 4.0–10.5)
nRBC: 0.7 % — ABNORMAL HIGH (ref 0.0–0.2)

## 2019-08-16 LAB — CMP (CANCER CENTER ONLY)
ALT: 14 U/L (ref 0–44)
AST: 19 U/L (ref 15–41)
Albumin: 4.4 g/dL (ref 3.5–5.0)
Alkaline Phosphatase: 46 U/L (ref 38–126)
Anion gap: 14 (ref 5–15)
BUN: 23 mg/dL (ref 8–23)
CO2: 25 mmol/L (ref 22–32)
Calcium: 10 mg/dL (ref 8.9–10.3)
Chloride: 104 mmol/L (ref 98–111)
Creatinine: 1.11 mg/dL — ABNORMAL HIGH (ref 0.44–1.00)
GFR, Est AFR Am: 52 mL/min — ABNORMAL LOW (ref >60)
GFR, Estimated: 45 mL/min — ABNORMAL LOW (ref >60)
Glucose, Bld: 75 mg/dL (ref 70–99)
Potassium: 3.8 mmol/L (ref 3.5–5.1)
Sodium: 143 mmol/L (ref 135–145)
Total Bilirubin: 1.1 mg/dL (ref 0.3–1.2)
Total Protein: 7.5 g/dL (ref 6.5–8.1)

## 2019-08-16 NOTE — Progress Notes (Signed)
[place]  Telephone:([phone] Fax:([phone]) [phone]     ID: Michelle [name] DOB: Jun 05, 1930  MR#: [id number]  YKZ#:[id number]  Patient Care Team: [name], MD as PCP - General (Internal Medicine) Magrinat, Virgie Dad, MD as Consulting Physician (Oncology) Donnie Mesa, MD as Consulting Physician (General Surgery) Mauro Kaufmann, RN as Oncology Nurse Navigator Rockwell Germany, RN as Oncology Nurse Navigator Scot Dock, NP OTHER MD:  CHIEF COMPLAINT: Bilateral estrogen receptor positive breast cancers  CURRENT TREATMENT: neoadjuvant anti-estrogens and palbociclib   INTERVAL HISTORY: Michelle [name] returns today for follow up of her bilateral estrogen receptor positive breast cancers accompanied by her daughter Michelle [name].  She notes that she is feeling well and she tolerates the Palbociclib and the Anastrozole well.  She is taking it as prescribed.  She notes her breast mass is smaller.     REVIEW OF SYSTEMS: [name] complains generalized back pain.  She has a h/o arthritis.  She denies any diarrhea.  She has a mildly decreased appetite, however is looking forward to her upcoming 86th birthday on Sunday.  She tells me that she is going to eat watermelon, chicken livers, and some greens.  She is looking forward to it.  She continues living alone with her family nearby.  Otherwise today a detailed ROS was non contributory.     HISTORY OF CURRENT ILLNESS: From the original intake note:  Michelle [name] herself palpated a left breast mass around January 2021, as she recalls. She underwent bilateral diagnostic mammography with tomography and bilateral breast ultrasonography at [facility] on 05/17/2019 showing: LEFT BREAST: breast density category B; 4.9 cm spiculated left breast mass extending from 12-2 o'clock, as well as to and likely involving overlying skin/nipple (with associated retraction); at least 8 abnormal left axillary lymph nodes;   RIGHT BREAST: two  suspicious right breast masses, 1.2 cm at 12 o'clock and 2.5 cm at 9:30, approximately 2.6 cm apart; negative right axilla.  Accordingly that same day, she proceeded to biopsy of the bilateral breast areas in question. The pathology from this procedure ([id number]) showed:  1. Left breast, 12-2 o'clock  - invasive and in situ mammary carcinoma, grade 2, e-cadherin positive   - estrogen receptor, 100% positive with strong staining intensity and progesterone receptor, 0% negative. Proliferation marker Ki67 at 12%. HER2 negative by immunohistochemistry (1+). 4. Lymph node, left axilla  - metastatic carcinoma with extra capsular extension (1/1)  2. Right breast, 9:30  - invasive ductal carcinoma, grade 1  - morphologically dissimilar from left breast carcinoma  - estrogen receptor, 100% positive with strong staining intensity and progesterone receptor, 0% negative. Proliferation marker Ki67 at 5%. HER2 negative by immunohistochemistry (1+). 3. Right breast, 12 o'clock  - invasive and in situ mammary carcinoma, grade 1-2   - vascular calcifications  - morphologically dissimilar from other right breast carcinoma  - estrogen receptor, 100% positive with strong staining intensity and progesterone receptor, 0% negative. Proliferation marker Ki67 at 5%. HER2 negative by immunohistochemistry (1+).   The patient's subsequent history is as detailed below.   PAST MEDICAL HISTORY: Past Medical History:  Diagnosis Date  . Arthritis   . Hypertension     PAST SURGICAL HISTORY: Past Surgical History:  Procedure Laterality Date  . SHOULDER SURGERY     Right  Status post total abdominal hysterectomy with bilateral salpingo-oophorectomy, status post appendectomy, status post tonsillectomy.   FAMILY HISTORY: Family History  Adopted: Yes  The patient is adopted and has  no information on her parents siblings or other blood relatives   GYNECOLOGIC HISTORY:  No LMP recorded. Patient is  postmenopausal. Menarche: Does not recall Age at first live birth: 86 years old [place] P 4 LMP 25s Hysterectomy?  Yes BSO?  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  Her children are Michelle [name], who lives in [place] and works for [name]) lives in [place] and runs his own business, and [name]") who lives in [place] and works for [employer]  He is also training his children to box.  Son [name] died from hepatitis C at age 45.  The patient has 11 grandchildren and more great grandchildren and great great-grandchildren than she can count.  She attends a local [place]: Not in place.  At the 05/28/2019 visit the patient was given the appropriate documents to complete and notarized at her discretion.   HEALTH MAINTENANCE: Social History   Tobacco Use  . Smoking status: Never Smoker  Substance Use Topics  . Alcohol use: No  . Drug use: No    Frequency: 3.0 times per week     Colonoscopy: Never  PAP: Remote  Bone density: Never   No Known Allergies  Current Outpatient Medications  Medication Sig Dispense Refill  . anastrozole (ARIMIDEX) 1 MG tablet Take 1 tablet (1 mg total) by mouth daily. 90 tablet 4  . aspirin 325 MG tablet Take 325 mg by mouth 2 (two) times daily.    . Calcium Carbonate-Vit D-Min (CALCIUM 600 + MINERALS) 600-200 MG-UNIT TABS Take 1-2 tablets by mouth daily. Take 2 tablets (1200 mg) in the am and Take 1 tablet (600 mg) at the pm.    . ciprofloxacin (CIPRO) 250 MG tablet Take 1 tablet (250 mg total) by mouth 2 (two) times daily. 14 tablet 0  . esomeprazole (NEXIUM) 20 MG capsule Take 20 mg by mouth daily as needed (acid reflux).    . gabapentin (NEURONTIN) 300 MG capsule Take 300 mg by mouth 3 (three) times daily.    Marland Kitchen losartan-hydrochlorothiazide (HYZAAR) 100-12.5 MG per tablet Take 1 tablet by mouth  daily.    . magnesium oxide (MAG-OX) 400 MG tablet Take 400 mg by mouth daily.    Marland Kitchen omeprazole (PRILOSEC) 20 MG capsule Take 1 capsule (20 mg total) by mouth daily. 30 capsule 0  . palbociclib (IBRANCE) 100 MG tablet Take 1 tablet (100 mg total) by mouth daily. Take for 21 days on, 7 days off, repeat every 28 days. 21 tablet 6  . pravastatin (PRAVACHOL) 20 MG tablet Take 20 mg by mouth daily.    . vitamin B-12 (CYANOCOBALAMIN) 1000 MCG tablet Take 1,000 mcg by mouth daily.     No current facility-administered medications for this visit.    OBJECTIVE: African-American woman examined in a wheelchair  Vitals:   08/16/19 1202  BP: 135/74  Pulse: 63  Resp: 18  Temp: 97.9 F (36.6 C)  SpO2: 100%     Body mass index is 22.86 kg/m.   Wt Readings from Last 3 Encounters:  08/16/19 121 lb (54.9 kg)  07/23/19 120 lb 8 oz (54.7 kg)  06/21/19 118 lb (53.5 kg)  ECOG FS:2 - Symptomatic, <50% confined to bed GENERAL: Patient is a well appearing female in no acute distress HEENT:  Sclerae anicteric.  Mask in place. Neck  is supple.  NODES:  No cervical, supraclavicular, or axillary lymphadenopathy palpated.  BREAST EXAM:  Left breast and axillary mass is softer, right mass softer and smaller LUNGS:  Clear to auscultation bilaterally.  No wheezes or rhonchi. HEART:  Regular rate and rhythm. No murmur appreciated. ABDOMEN:  Soft, nontender.  Positive, normoactive bowel sounds. No organomegaly palpated. MSK:  No focal spinal tenderness to palpation. Full range of motion bilaterally in the upper extremities. EXTREMITIES:  No peripheral edema.   SKIN:  Clear with no obvious rashes or skin changes. No nail dyscrasia. NEURO:  Nonfocal. Well oriented.  Appropriate affect.      Left beast 05/28/2019    Right breast, 05/28/2019    LAB RESULTS:  CMP     Component Value Date/Time   NA 143 07/23/2019 1539   K 4.4 07/23/2019 1539   CL 105 07/23/2019 1539   CO2 29 07/23/2019 1539   GLUCOSE  106 (H) 07/23/2019 1539   BUN 21 07/23/2019 1539   CREATININE 0.99 07/23/2019 1539   CALCIUM 10.0 07/23/2019 1539   PROT 7.1 07/23/2019 1539   ALBUMIN 4.1 07/23/2019 1539   AST 15 07/23/2019 1539   ALT 9 07/23/2019 1539   ALKPHOS 43 07/23/2019 1539   BILITOT 0.8 07/23/2019 1539   GFRNONAA 52 (L) 07/23/2019 1539   GFRAA 60 (L) 07/23/2019 1539    No results found for: TOTALPROTELP, ALBUMINELP, A1GS, A2GS, BETS, BETA2SER, GAMS, MSPIKE, SPEI  Lab Results  Component Value Date   WBC 2.7 (L) 07/23/2019   NEUTROABS 1.0 (L) 07/23/2019   HGB 9.5 (L) 07/23/2019   HCT 29.6 (L) 07/23/2019   MCV 96.4 07/23/2019   PLT 133 (L) 07/23/2019    No results found for: LABCA2  No components found for: [id number]  No results for input(s): INR in the last 168 hours.  No results found for: LABCA2  No results found for: WGY659  No results found for: DJT701  No results found for: XBL390  No results found for: CA2729  No components found for: HGQUANT  No results found for: CEA1 / No results found for: CEA1   No results found for: AFPTUMOR  No results found for: CHROMOGRNA  No results found for: KPAFRELGTCHN, LAMBDASER, KAPLAMBRATIO (kappa/lambda light chains)  No results found for: HGBA, HGBA2QUANT, HGBFQUANT, HGBSQUAN (Hemoglobinopathy evaluation)   No results found for: LDH  No results found for: IRON, TIBC, IRONPCTSAT (Iron and TIBC)  No results found for: FERRITIN  Urinalysis    Component Value Date/Time   COLORURINE YELLOW 06/21/2019 1030   APPEARANCEUR CLEAR 06/21/2019 1030   LABSPEC 1.015 06/21/2019 1030   PHURINE 5.0 06/21/2019 1030   GLUCOSEU NEGATIVE 06/21/2019 1030   HGBUR NEGATIVE 06/21/2019 1030   BILIRUBINUR NEGATIVE 06/21/2019 1030   KETONESUR NEGATIVE 06/21/2019 1030   PROTEINUR NEGATIVE 06/21/2019 1030   UROBILINOGEN 0.2 07/05/2012 1610   NITRITE NEGATIVE 06/21/2019 1030   LEUKOCYTESUR TRACE (A) 06/21/2019 1030     STUDIES: No results  found.   ELIGIBLE FOR AVAILABLE RESEARCH PROTOCOL: AET  ASSESSMENT: 86 y.o. [place] woman status post bilateral breast biopsies on 05/17/2019 as follows:  (a) on the left upper outer quadrant, a clinical T2 N2, stage IIIA invasive ductal carcinoma,grade 2 or 3, estrogen receptor positive, progesterone receptor negative, with no HER-2 amplification and an MIB-1 of 12%  (b) on the right overlapping sites, 2 morphologically distinct invasive ductal carcinomas, both clinically T1c N0, stage IA, grade 1 or 2, estrogen receptor positive, progesterone receptor negative,  with no HER-2 amplification and an MIB-1 of 5%  (c) CT of the chest abdomen and pelvis 06/10/2019 shows left axillary, retropectoral and supraclavicular lymph nodes, and a 4.1 cm left breast mass; no right axillary adenopathy; 0.6 cm nonspecific left lower lobe pulmonary nodule; no liver or bone lesions.  (d) bone scan 06/26/2019    (1) neoadjuvant anastrozole started 05/28/2019, palbociclib added 05/30/2019 125 mg a day, 21 on 7 off  (a) palbociclib dose decreased cycle 2 (May 2021) to 100 mg daily, 21/7  (2) genetics testing pending  (3) definitive surgery to follow  (4) adjuvant radiation   PLAN: Yariela continues on treatment with anastrozole and Palbociclib.  She is tolerating these well.  I reviewed her labs with her today.  Her ANC is 1.8 today.  She will start back next week.  She is cleared to start back at the 119m daily dose.  She will continue on Anastrozole daily.  I wished her a happy birthday next week.   BDelrae Sawyers and I reviewed the purpose of taking the Anastrozole and Palbociclib.  The goal is to shrink the tumors to allow for a smoother surgery with cleaner margins, meaning the area around the tumor is negative for any cancer.  She and NRonny Baconunderstand this.    BEllanwill return in 4 weeks for labs and f/u.  She knows to call for any questions that may arise between now and her next appointment.  We  are happy to see her sooner if needed.   Total encounter time 30 minutes.*Wilber Bihari NP 08/16/19 12:16 PM Medical Oncology and Hemat[facility] [place] [address]Tel. [phone]   Fax. [phone] *Total Encounter Time as defined by the Centers for Medicare and Medicaid Services includes, in addition to the face-to-face time of a patient visit (documented in the note above) non-face-to-face time: obtaining and reviewing outside history, ordering and reviewing medications, tests or procedures, care coordination (communications with other health care professionals or caregivers) and documentation in the medical record.

## 2019-08-19 MED FILL — IBRANCE 100 MG TABS: 100 | 28 days supply | Qty: 21 | Fill #2

## 2019-08-20 ENCOUNTER — Telehealth: Payer: Self-pay | Admitting: Adult Health

## 2019-08-20 NOTE — Telephone Encounter (Signed)
No 7/2 los. No changes made to pt's schedule.

## 2019-08-26 ENCOUNTER — Other Ambulatory Visit: Payer: Self-pay | Admitting: *Deleted

## 2019-08-26 MED ORDER — ANASTROZOLE 1 MG PO TABS: 1.0000 mg | ORAL_TABLET | Freq: Every day | ORAL | 4 refills | Status: DC

## 2019-09-13 ENCOUNTER — Encounter: Payer: Self-pay | Admitting: Adult Health

## 2019-09-13 ENCOUNTER — Inpatient Hospital Stay (HOSPITAL_BASED_OUTPATIENT_CLINIC_OR_DEPARTMENT_OTHER): Payer: Medicare HMO | Admitting: Adult Health

## 2019-09-13 ENCOUNTER — Other Ambulatory Visit: Payer: Self-pay

## 2019-09-13 ENCOUNTER — Inpatient Hospital Stay: Payer: Medicare HMO

## 2019-09-13 VITALS — BP 154/77 | HR 62 | Temp 98.9°F | Resp 18 | Ht 61.0 in | Wt 121.1 lb

## 2019-09-13 DIAGNOSIS — M1990 Unspecified osteoarthritis, unspecified site: Secondary | ICD-10-CM | POA: Diagnosis not present

## 2019-09-13 DIAGNOSIS — I10 Essential (primary) hypertension: Secondary | ICD-10-CM | POA: Diagnosis not present

## 2019-09-13 DIAGNOSIS — Z79811 Long term (current) use of aromatase inhibitors: Secondary | ICD-10-CM | POA: Diagnosis not present

## 2019-09-13 DIAGNOSIS — C50811 Malignant neoplasm of overlapping sites of right female breast: Secondary | ICD-10-CM

## 2019-09-13 DIAGNOSIS — C50412 Malignant neoplasm of upper-outer quadrant of left female breast: Secondary | ICD-10-CM

## 2019-09-13 DIAGNOSIS — Z170 Estrogen receptor positive status [ER+]: Secondary | ICD-10-CM

## 2019-09-13 DIAGNOSIS — M549 Dorsalgia, unspecified: Secondary | ICD-10-CM | POA: Diagnosis not present

## 2019-09-13 DIAGNOSIS — R911 Solitary pulmonary nodule: Secondary | ICD-10-CM | POA: Diagnosis not present

## 2019-09-13 DIAGNOSIS — Z923 Personal history of irradiation: Secondary | ICD-10-CM | POA: Diagnosis not present

## 2019-09-13 LAB — CMP (CANCER CENTER ONLY)
ALT: 11 U/L (ref 0–44)
AST: 18 U/L (ref 15–41)
Albumin: 4.1 g/dL (ref 3.5–5.0)
Alkaline Phosphatase: 43 U/L (ref 38–126)
Anion gap: 11 (ref 5–15)
BUN: 19 mg/dL (ref 8–23)
CO2: 26 mmol/L (ref 22–32)
Calcium: 10.2 mg/dL (ref 8.9–10.3)
Chloride: 107 mmol/L (ref 98–111)
Creatinine: 1.15 mg/dL — ABNORMAL HIGH (ref 0.44–1.00)
GFR, Est AFR Am: 50 mL/min — ABNORMAL LOW (ref >60)
GFR, Estimated: 43 mL/min — ABNORMAL LOW (ref >60)
Glucose, Bld: 80 mg/dL (ref 70–99)
Potassium: 4.2 mmol/L (ref 3.5–5.1)
Sodium: 144 mmol/L (ref 135–145)
Total Bilirubin: 0.7 mg/dL (ref 0.3–1.2)
Total Protein: 7.1 g/dL (ref 6.5–8.1)

## 2019-09-13 LAB — CBC WITH DIFFERENTIAL (CANCER CENTER ONLY)
Abs Immature Granulocytes: 0.01 10*3/uL (ref 0.00–0.07)
Basophils Absolute: 0.1 10*3/uL (ref 0.0–0.1)
Basophils Relative: 3 %
Eosinophils Absolute: 0.2 10*3/uL (ref 0.0–0.5)
Eosinophils Relative: 6 %
HCT: 30.5 % — ABNORMAL LOW (ref 36.0–46.0)
Hemoglobin: 9.8 g/dL — ABNORMAL LOW (ref 12.0–15.0)
Immature Granulocytes: 0 %
Lymphocytes Relative: 25 %
Lymphs Abs: 0.8 10*3/uL (ref 0.7–4.0)
MCH: 33.3 pg (ref 26.0–34.0)
MCHC: 32.1 g/dL (ref 30.0–36.0)
MCV: 103.7 fL — ABNORMAL HIGH (ref 80.0–100.0)
Monocytes Absolute: 0.3 10*3/uL (ref 0.1–1.0)
Monocytes Relative: 9 %
Neutro Abs: 1.8 10*3/uL (ref 1.7–7.7)
Neutrophils Relative %: 57 %
Platelet Count: 207 10*3/uL (ref 150–400)
RBC: 2.94 MIL/uL — ABNORMAL LOW (ref 3.87–5.11)
RDW: 16.2 % — ABNORMAL HIGH (ref 11.5–15.5)
WBC Count: 3.1 10*3/uL — ABNORMAL LOW (ref 4.0–10.5)
nRBC: 0 % (ref 0.0–0.2)

## 2019-09-13 LAB — URINALYSIS, COMPLETE (UACMP) WITH MICROSCOPIC
Bilirubin Urine: NEGATIVE
Glucose, UA: NEGATIVE mg/dL
Ketones, ur: NEGATIVE mg/dL
Nitrite: NEGATIVE
Protein, ur: 30 mg/dL — AB
Specific Gravity, Urine: 1.017 (ref 1.005–1.030)
WBC, UA: >50 WBC/hpf — ABNORMAL HIGH (ref 0–5)
pH: 5 (ref 5.0–8.0)

## 2019-09-13 MED ORDER — SULFAMETHOXAZOLE-TRIMETHOPRIM 800-160 MG PO TABS: ORAL_TABLET | ORAL | 0 refills | Status: DC

## 2019-09-13 NOTE — Progress Notes (Signed)
[facility]  Telephone:([phone]) [phone] Fax:(336) [phone]     ID: Michelle [name] DOB: [birth year]  MR#: [id number]  ZDG#:[id number]  Patient Care Team: Michelle Battles, MD as PCP - General (Internal Medicine) Michelle [name], Michelle Dad, MD as Consulting Physician (Oncology) Michelle Mesa, MD as Consulting Physician (General Surgery) Michelle Kaufmann, RN as Oncology Nurse Navigator Michelle Germany, RN as Oncology Nurse Navigator Michelle Dock, NP OTHER MD:  CHIEF COMPLAINT: Bilateral estrogen receptor positive breast cancers  CURRENT TREATMENT: neoadjuvant anti-estrogens and palbociclib   INTERVAL HISTORY: Michelle [name] returns today for follow up of her bilateral estrogen receptor positive breast cancers accompanied by her daughter Michelle [name] and her son.      She notes that she is feeling well and she tolerates the Palbociclib and the Anastrozole well.  She is taking it as prescribed.  She notes her breast mass is smaller.    She is currently in her off week for the Palbociclib.     REVIEW OF SYSTEMS: Michelle [name] has had some blood in her urine today and left [name] a urine sample.  She denies any new issues such as fever, chills, chest pain, palpitations, cough, bowel changes, headaches or vision changes.  She has some mild pain at her left breast.  Otherwise a detailed ROS was non contributory.     HISTORY OF CURRENT ILLNESS: From the original intake note:  Michelle [name] herself palpated a left breast mass around January 2021, as she recalls. She underwent bilateral diagnostic mammography with tomography and bilateral breast ultrasonography at [facility] on 05/17/2019 showing: LEFT BREAST: breast density category B; 4.9 cm spiculated left breast mass extending from 12-2 o'clock, as well as to and likely involving overlying skin/nipple (with associated retraction); at least 8 abnormal left axillary lymph nodes;   RIGHT BREAST: two suspicious right breast masses, 1.2 cm at 12 o'clock  and 2.5 cm at 9:30, approximately 2.6 cm apart; negative right axilla.  Accordingly that same day, she proceeded to biopsy of the bilateral breast areas in question. The pathology from this procedure ([id number]) showed:  1. Left breast, 12-2 o'clock  - invasive and in situ mammary carcinoma, grade 2, e-cadherin positive   - estrogen receptor, 100% positive with strong staining intensity and progesterone receptor, 0% negative. Proliferation marker Ki67 at 12%. HER2 negative by immunohistochemistry (1+). 4. Lymph node, left axilla  - metastatic carcinoma with extra capsular extension (1/1)  2. Right breast, 9:30  - invasive ductal carcinoma, grade 1  - morphologically dissimilar from left breast carcinoma  - estrogen receptor, 100% positive with strong staining intensity and progesterone receptor, 0% negative. Proliferation marker Ki67 at 5%. HER2 negative by immunohistochemistry (1+). 3. Right breast, 12 o'clock  - invasive and in situ mammary carcinoma, grade 1-2   - vascular calcifications  - morphologically dissimilar from other right breast carcinoma  - estrogen receptor, 100% positive with strong staining intensity and progesterone receptor, 0% negative. Proliferation marker Ki67 at 5%. HER2 negative by immunohistochemistry (1+).   The patient's subsequent history is as detailed below.   PAST MEDICAL HISTORY: Past Medical History:  Diagnosis Date  . Arthritis   . Hypertension     PAST SURGICAL HISTORY: Past Surgical History:  Procedure Laterality Date  . SHOULDER SURGERY     Right  Status post total abdominal hysterectomy with bilateral salpingo-oophorectomy, status post appendectomy, status post tonsillectomy.   FAMILY HISTORY: Family History  Adopted: Yes  The patient is adopted and has no information  on her parents siblings or other blood relatives   GYNECOLOGIC HISTORY:  No LMP recorded. Patient is postmenopausal. Menarche: Does not recall Age at first live  birth: 86 years old [place] P 4 LMP 84s Hysterectomy?  Yes BSO?  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  Her children are Michelle [name], who lives in [place] and works for [employer] Michelle [name]) lives in [place] and runs his own business, and Michelle [name]") who lives in [place] and works for [employer]  He is also training his children to box.  Son [name] died from hepatitis C at age 23.  The patient has 11 grandchildren and more great grandchildren and great great-grandchildren than she can count.  She attends a local [place]: Not in place.  At the 05/28/2019 visit the patient was given the appropriate documents to complete and notarized at her discretion.   HEALTH MAINTENANCE: Social History   Tobacco Use  . Smoking status: Never Smoker  . Smokeless tobacco: Never Used  Substance Use Topics  . Alcohol use: No  . Drug use: No    Frequency: 3.0 times per week     Colonoscopy: Never  PAP: Remote  Bone density: Never   No Known Allergies  Current Outpatient Medications  Medication Sig Dispense Refill  . anastrozole (ARIMIDEX) 1 MG tablet Take 1 tablet (1 mg total) by mouth daily. 90 tablet 4  . aspirin 325 MG tablet Take 325 mg by mouth 2 (two) times daily.    . Calcium Carbonate-Vit D-Min (CALCIUM 600 + MINERALS) 600-200 MG-UNIT TABS Take 1-2 tablets by mouth daily. Take 2 tablets (1200 mg) in the am and Take 1 tablet (600 mg) at the pm.    . esomeprazole (NEXIUM) 20 MG capsule Take 20 mg by mouth daily as needed (acid reflux).    . gabapentin (NEURONTIN) 300 MG capsule Take 300 mg by mouth 3 (three) times daily.    Marland Kitchen losartan-hydrochlorothiazide (HYZAAR) 100-12.5 MG per tablet Take 1 tablet by mouth daily.    . magnesium oxide (MAG-OX) 400 MG tablet Take 400 mg by mouth daily.    Marland Kitchen omeprazole (PRILOSEC) 20 MG capsule Take 1 capsule (20 mg  total) by mouth daily. 30 capsule 0  . palbociclib (IBRANCE) 100 MG tablet Take 1 tablet (100 mg total) by mouth daily. Take for 21 days on, 7 days off, repeat every 28 days. 21 tablet 6  . pravastatin (PRAVACHOL) 20 MG tablet Take 20 mg by mouth daily.    . vitamin B-12 (CYANOCOBALAMIN) 1000 MCG tablet Take 1,000 mcg by mouth daily.    Marland Kitchen sulfamethoxazole-trimethoprim (BACTRIM DS) 800-160 MG tablet Take one full tablet x 1, then take one half tablet BID x 5 days. 10 tablet 0   No current facility-administered medications for this visit.    OBJECTIVE: African-American woman examined in a wheelchair  Vitals:   09/13/19 1214  BP: (!) 154/77  Pulse: 62  Resp: 18  Temp: 98.9 F (37.2 C)  SpO2: 100%     Body mass index is 22.88 kg/m.   Wt Readings from Last 3 Encounters:  09/13/19 121 lb 1.6 oz (54.9 kg)  08/16/19 121 lb (54.9 kg)  07/23/19 120 lb 8 oz (54.7 kg)  ECOG FS:2 - Symptomatic, <50% confined to bed GENERAL: Patient is a well appearing female in no  acute distress HEENT:  Sclerae anicteric.  Mask in place. Neck is supple.  NODES:  No cervical, supraclavicular, or axillary lymphadenopathy palpated.  BREAST EXAM:  Left breast and axillary mass is softer, right mass softer and smaller--no progression noted LUNGS:  Clear to auscultation bilaterally.  No wheezes or rhonchi. HEART:  Regular rate and rhythm. No murmur appreciated. ABDOMEN:  Soft, nontender.  Positive, normoactive bowel sounds. No organomegaly palpated. MSK:  No focal spinal tenderness to palpation. Full range of motion bilaterally in the upper extremities. EXTREMITIES:  No peripheral edema.   SKIN:  Clear with no obvious rashes or skin changes. No nail dyscrasia. NEURO:  Nonfocal. Well oriented.  Appropriate affect.      Left beast 05/28/2019    Right breast, 05/28/2019    LAB RESULTS:  CMP     Component Value Date/Time   NA 144 09/13/2019 1129   K 4.2 09/13/2019 1129   CL 107 09/13/2019 1129    CO2 26 09/13/2019 1129   GLUCOSE 80 09/13/2019 1129   BUN 19 09/13/2019 1129   CREATININE 1.15 (H) 09/13/2019 1129   CALCIUM 10.2 09/13/2019 1129   PROT 7.1 09/13/2019 1129   ALBUMIN 4.1 09/13/2019 1129   AST 18 09/13/2019 1129   ALT 11 09/13/2019 1129   ALKPHOS 43 09/13/2019 1129   BILITOT 0.7 09/13/2019 1129   GFRNONAA 43 (L) 09/13/2019 1129   GFRAA 50 (L) 09/13/2019 1129    No results found for: TOTALPROTELP, ALBUMINELP, A1GS, A2GS, BETS, BETA2SER, GAMS, MSPIKE, SPEI  Lab Results  Component Value Date   WBC 3.1 (L) 09/13/2019   NEUTROABS 1.8 09/13/2019   HGB 9.8 (L) 09/13/2019   HCT 30.5 (L) 09/13/2019   MCV 103.7 (H) 09/13/2019   PLT 207 09/13/2019    No results found for: LABCA2  No components found for: ASNKNL976  No results for input(s): INR in the last 168 hours.  No results found for: LABCA2  No results found for: BHA193  No results found for: XTK240  No results found for: XBD532  No results found for: CA2729  No components found for: HGQUANT  No results found for: CEA1 / No results found for: CEA1   No results found for: AFPTUMOR  No results found for: CHROMOGRNA  No results found for: KPAFRELGTCHN, LAMBDASER, KAPLAMBRATIO (kappa/lambda light chains)  No results found for: HGBA, HGBA2QUANT, HGBFQUANT, HGBSQUAN (Hemoglobinopathy evaluation)   No results found for: LDH  No results found for: IRON, TIBC, IRONPCTSAT (Iron and TIBC)  No results found for: FERRITIN  Urinalysis    Component Value Date/Time   COLORURINE YELLOW 09/13/2019 1244   APPEARANCEUR CLOUDY (A) 09/13/2019 1244   LABSPEC 1.017 09/13/2019 1244   PHURINE 5.0 09/13/2019 [place] 09/13/2019 [place] (A) 09/13/2019 [place] 09/13/2019 [place] 09/13/2019 1244   PROTEINUR 30 (A) 09/13/2019 1244   UROBILINOGEN 0.2 07/05/2012 1610   NITRITE NEGATIVE 09/13/2019 1244   LEUKOCYTESUR LARGE (A) 09/13/2019 1244      STUDIES: No results found.   ELIGIBLE FOR AVAILABLE RESEARCH PROTOCOL: AET  ASSESSMENT: 86 y.o. [place] woman status post bilateral breast biopsies on 05/17/2019 as follows:  (a) on the left upper outer quadrant, a clinical T2 N2, stage IIIA invasive ductal carcinoma,grade 2 or 3, estrogen receptor positive, progesterone receptor negative, with no HER-2 amplification and an MIB-1 of 12%  (b) on the right overlapping sites, 2 morphologically distinct invasive ductal carcinomas, both  clinically T1c N0, stage IA, grade 1 or 2, estrogen receptor positive, progesterone receptor negative, with no HER-2 amplification and an MIB-1 of 5%  (c) CT of the chest abdomen and pelvis 06/10/2019 shows left axillary, retropectoral and supraclavicular lymph nodes, and a 4.1 cm left breast mass; no right axillary adenopathy; 0.6 cm nonspecific left lower lobe pulmonary nodule; no liver or bone lesions.  (d) bone scan 06/26/2019    (1) neoadjuvant anastrozole started 05/28/2019, palbociclib added 05/30/2019 125 mg a day, 21 on 7 off  (a) palbociclib dose decreased cycle 2 (May 2021) to 100 mg daily, 21/7  (2) genetics testing pending  (3) definitive surgery to follow  (4) adjuvant radiation   PLAN: Christiana continues on treatment with anastrozole and Palbociclib.  Her labs remain stable, most specifically her [place].  She will continue this.    She has what I am sure is a urinary tract infection.  She last had one in 06/2019.  There was not a culture sent on that urine sample.  A culture was sent on today's.  I calculated her creatinine clearance at 30.  I sent in Bactrim DS 1 tab x 1, then 1/2 tab every 12 hours for 5 days to her pharmacy based on prescribing guidelines.  We will follow her urine culture and may need to change her antibiotics accordingly.  I let her know that if she continues to develop urinary tract infections, we may need to consider another dose decrease of the Palbociclib.     It is  time to repeat a breast ultrasound to evaluate objectively how her tumor is responding to treatment.  It will also help [name] gauge how much longer she may need this treatment neoadjuvantly.  I placed orders for bilateral breast ultrasounds to assess this.    Sandrea will return in 4 weeks for labs, f/u with Dr. [name].  She knows to call for any questions that may arise between now and her next appointment.  We are happy to see her sooner if needed.   Total encounter time 30 minutes.Wilber Bihari, NP 09/13/19 2:57 PM Medical Oncology and Hematology [facility] [place], [place] [address] Tel. [phone]    Fax. [phone]  *Total Encounter Time as defined by the Centers for Medicare and Medicaid Services includes, in addition to the face-to-face time of a patient visit (documented in the note above) non-face-to-face time: obtaining and reviewing outside history, ordering and reviewing medications, tests or procedures, care coordination (communications with other health care professionals or caregivers) and documentation in the medical record.

## 2019-09-16 ENCOUNTER — Encounter: Payer: Self-pay | Admitting: *Deleted

## 2019-09-16 ENCOUNTER — Telehealth: Payer: Self-pay | Admitting: Adult Health

## 2019-09-16 LAB — URINE CULTURE: Culture: >=100000 — AB

## 2019-09-16 NOTE — Telephone Encounter (Signed)
Scheduled appts per 7/30 los. Pt's voicemail box was full. Mailed appt reminder and calendar.

## 2019-09-17 ENCOUNTER — Telehealth: Payer: Self-pay

## 2019-09-17 ENCOUNTER — Other Ambulatory Visit: Payer: Self-pay | Admitting: Adult Health

## 2019-09-17 DIAGNOSIS — N3000 Acute cystitis without hematuria: Secondary | ICD-10-CM

## 2019-09-17 MED ORDER — AMOXICILLIN 500 MG PO TABS: 500.0000 mg | ORAL_TABLET | Freq: Two times a day (BID) | ORAL | 0 refills | Status: DC

## 2019-09-17 MED FILL — IBRANCE 100 MG TABS: 100 | 28 days supply | Qty: 21 | Fill #3

## 2019-09-17 NOTE — Progress Notes (Signed)
Urine culture positive for enterococcus.  Reviewed with pharmacy, treatment with Bactrim is debated.  Amoxicillin sent in for patient to take into [facility] on [address].  Wilber Bihari, NP

## 2019-09-17 NOTE — Telephone Encounter (Signed)
Attempted to call pt's daughter, [name], but VM box was full so I placed call to pt's son [name], per Wilber Bihari, NP to inform him of pt results, stop Bactrim if still taking, and start Amoxicillin as prescribed. Pt's son verbalized understanding.

## 2019-09-20 ENCOUNTER — Encounter: Payer: Self-pay | Admitting: *Deleted

## 2019-09-23 ENCOUNTER — Encounter: Payer: Self-pay | Admitting: *Deleted

## 2019-10-04 ENCOUNTER — Ambulatory Visit
Admission: RE | Admit: 2019-10-04 | Discharge: 2019-10-04 | Disposition: A | Discharge status: Home / Self Care | Payer: Medicare HMO | Source: Ambulatory Visit | Attending: Adult Health | Admitting: Adult Health

## 2019-10-04 ENCOUNTER — Other Ambulatory Visit: Payer: Self-pay

## 2019-10-04 DIAGNOSIS — C50912 Malignant neoplasm of unspecified site of left female breast: Secondary | ICD-10-CM | POA: Diagnosis not present

## 2019-10-04 DIAGNOSIS — C50811 Malignant neoplasm of overlapping sites of right female breast: Secondary | ICD-10-CM

## 2019-10-04 DIAGNOSIS — C50911 Malignant neoplasm of unspecified site of right female breast: Secondary | ICD-10-CM | POA: Diagnosis not present

## 2019-10-10 ENCOUNTER — Telehealth: Payer: Self-pay | Admitting: Oncology

## 2019-10-10 NOTE — Telephone Encounter (Signed)
Rescheduled appts per 8/25 voicemail. Pt's daughter confirmed new appt date and time.

## 2019-10-11 ENCOUNTER — Inpatient Hospital Stay: Payer: Medicare HMO | Admitting: Adult Health

## 2019-10-11 ENCOUNTER — Inpatient Hospital Stay: Payer: Medicare HMO

## 2019-10-14 ENCOUNTER — Encounter: Payer: Self-pay | Admitting: *Deleted

## 2019-10-14 MED FILL — IBRANCE 100 MG TABS: 100 | 28 days supply | Qty: 21 | Fill #4

## 2019-10-31 ENCOUNTER — Other Ambulatory Visit: Payer: Self-pay

## 2019-10-31 ENCOUNTER — Encounter: Payer: Self-pay | Admitting: *Deleted

## 2019-10-31 DIAGNOSIS — Z170 Estrogen receptor positive status [ER+]: Secondary | ICD-10-CM

## 2019-11-01 ENCOUNTER — Telehealth: Payer: Self-pay | Admitting: Adult Health

## 2019-11-01 ENCOUNTER — Other Ambulatory Visit: Payer: Self-pay

## 2019-11-01 ENCOUNTER — Encounter: Payer: Self-pay | Admitting: Adult Health

## 2019-11-01 ENCOUNTER — Inpatient Hospital Stay (HOSPITAL_BASED_OUTPATIENT_CLINIC_OR_DEPARTMENT_OTHER): Payer: Medicare HMO | Admitting: Adult Health

## 2019-11-01 ENCOUNTER — Inpatient Hospital Stay: Payer: Medicare HMO | Attending: Adult Health

## 2019-11-01 VITALS — BP 144/61 | HR 59 | Temp 97.7°F | Resp 17 | Ht 61.0 in | Wt 122.0 lb

## 2019-11-01 DIAGNOSIS — C778 Secondary and unspecified malignant neoplasm of lymph nodes of multiple regions: Secondary | ICD-10-CM | POA: Insufficient documentation

## 2019-11-01 DIAGNOSIS — C50411 Malignant neoplasm of upper-outer quadrant of right female breast: Secondary | ICD-10-CM | POA: Insufficient documentation

## 2019-11-01 DIAGNOSIS — C50811 Malignant neoplasm of overlapping sites of right female breast: Secondary | ICD-10-CM | POA: Diagnosis not present

## 2019-11-01 DIAGNOSIS — Z170 Estrogen receptor positive status [ER+]: Secondary | ICD-10-CM | POA: Diagnosis not present

## 2019-11-01 DIAGNOSIS — C50412 Malignant neoplasm of upper-outer quadrant of left female breast: Secondary | ICD-10-CM

## 2019-11-01 DIAGNOSIS — C50812 Malignant neoplasm of overlapping sites of left female breast: Secondary | ICD-10-CM | POA: Diagnosis not present

## 2019-11-01 DIAGNOSIS — Z79899 Other long term (current) drug therapy: Secondary | ICD-10-CM | POA: Diagnosis not present

## 2019-11-01 DIAGNOSIS — R300 Dysuria: Secondary | ICD-10-CM

## 2019-11-01 DIAGNOSIS — C7802 Secondary malignant neoplasm of left lung: Secondary | ICD-10-CM | POA: Insufficient documentation

## 2019-11-01 LAB — CBC WITH DIFFERENTIAL (CANCER CENTER ONLY)
Abs Immature Granulocytes: 0.01 10*3/uL (ref 0.00–0.07)
Basophils Absolute: 0.1 10*3/uL (ref 0.0–0.1)
Basophils Relative: 2 %
Eosinophils Absolute: 0.1 10*3/uL (ref 0.0–0.5)
Eosinophils Relative: 3 %
HCT: 31.7 % — ABNORMAL LOW (ref 36.0–46.0)
Hemoglobin: 10.2 g/dL — ABNORMAL LOW (ref 12.0–15.0)
Immature Granulocytes: 0 %
Lymphocytes Relative: 23 %
Lymphs Abs: 0.8 10*3/uL (ref 0.7–4.0)
MCH: 33 pg (ref 26.0–34.0)
MCHC: 32.2 g/dL (ref 30.0–36.0)
MCV: 102.6 fL — ABNORMAL HIGH (ref 80.0–100.0)
Monocytes Absolute: 0.3 10*3/uL (ref 0.1–1.0)
Monocytes Relative: 7 %
Neutro Abs: 2.3 10*3/uL (ref 1.7–7.7)
Neutrophils Relative %: 65 %
Platelet Count: 326 10*3/uL (ref 150–400)
RBC: 3.09 MIL/uL — ABNORMAL LOW (ref 3.87–5.11)
RDW: 13.4 % (ref 11.5–15.5)
WBC Count: 3.6 10*3/uL — ABNORMAL LOW (ref 4.0–10.5)
nRBC: 0 % (ref 0.0–0.2)

## 2019-11-01 LAB — CMP (CANCER CENTER ONLY)
ALT: 11 U/L (ref 0–44)
AST: 18 U/L (ref 15–41)
Albumin: 4 g/dL (ref 3.5–5.0)
Alkaline Phosphatase: 50 U/L (ref 38–126)
Anion gap: 8 (ref 5–15)
BUN: 27 mg/dL — ABNORMAL HIGH (ref 8–23)
CO2: 29 mmol/L (ref 22–32)
Calcium: 9.6 mg/dL (ref 8.9–10.3)
Chloride: 105 mmol/L (ref 98–111)
Creatinine: 1.21 mg/dL — ABNORMAL HIGH (ref 0.44–1.00)
GFR, Est AFR Am: 47 mL/min — ABNORMAL LOW (ref >60)
GFR, Estimated: 40 mL/min — ABNORMAL LOW (ref >60)
Glucose, Bld: 80 mg/dL (ref 70–99)
Potassium: 3.8 mmol/L (ref 3.5–5.1)
Sodium: 142 mmol/L (ref 135–145)
Total Bilirubin: 0.7 mg/dL (ref 0.3–1.2)
Total Protein: 7.2 g/dL (ref 6.5–8.1)

## 2019-11-01 LAB — URINALYSIS, COMPLETE (UACMP) WITH MICROSCOPIC
Bilirubin Urine: NEGATIVE
Glucose, UA: NEGATIVE mg/dL
Hgb urine dipstick: NEGATIVE
Ketones, ur: NEGATIVE mg/dL
Nitrite: NEGATIVE
Protein, ur: NEGATIVE mg/dL
Specific Gravity, Urine: 1.018 (ref 1.005–1.030)
WBC, UA: >50 WBC/hpf — ABNORMAL HIGH (ref 0–5)
pH: 5 (ref 5.0–8.0)

## 2019-11-01 NOTE — Telephone Encounter (Signed)
Scheduled appts per 9/17 los. Gave pt a print out of AVS.  

## 2019-11-01 NOTE — Progress Notes (Addendum)
[place]  [phone] [phone]     ID: Michelle [name] DOB: December 10, 1930  MR#: [id number]  LEX#:[id number]  Patient Care Team: [name], MD as PCP - General (Internal Medicine) Magrinat, Virgie Dad, MD as Consulting Physician (Oncology) Donnie Mesa, MD as Consulting Physician (General Surgery) Mauro Kaufmann, RN as Oncology Nurse Navigator Rockwell Germany, RN as Oncology Nurse Navigator Scot Dock, NP OTHER MD:  CHIEF COMPLAINT: Bilateral estrogen receptor positive breast cancers  CURRENT TREATMENT: neoadjuvant anti-estrogens and palbociclib   INTERVAL HISTORY: Yuvia returns today for follow up of her bilateral estrogen receptor positive breast cancers accompanied by her daughter [name] and her son.       She is taking Anastrozole daily with good tolerance.  She has no arthralgias, noticeable hot flashes.    She also continues on palbocicilib 3 weeks on and 1 week off with good tolerance.  She is at the 156m dose, and her ANC is normal today.    BKeziahunderwent breast ultrasound on 10/04/2019 which showed reduction in her bilateral breast cancer, by about 1cm  In each mass.     REVIEW OF SYSTEMS: BBraylynnhad some dysuria in July and that was related to a urinary tract infection.  She only drinks about 16-20 ounces of water per day.  She denies any new issues such as fever, chills, chest pain, palpitations, cough, shortness of breath, pain or any other concerns.  A detailed ROS was otherwise non contributory today.   HISTORY OF CURRENT ILLNESS: From the original intake note:  BMARGURETTE BRENERherself palpated a left breast mass around January 2021, as she recalls. She underwent bilateral diagnostic mammography with tomography and bilateral breast ultrasonography at [facility] 05/17/2019 showing: LEFT BREAST: breast density category B; 4.9 cm spiculated left breast mass extending from 12-2 o'clock, as well as to and  likely involving overlying skin/nipple (with associated retraction); at least 8 abnormal left axillary lymph nodes;   RIGHT BREAST: two suspicious right breast masses, 1.2 cm at 12 o'clock and 2.5 cm at 9:30, approximately 2.6 cm apart; negative right axilla.  Accordingly that same day, she proceeded to biopsy of the bilateral breast areas in question. The pathology from this procedure ([id number]) showed:  1. Left breast, 12-2 o'clock  - invasive and in situ mammary carcinoma, grade 2, e-cadherin positive   - estrogen receptor, 100% positive with strong staining intensity and progesterone receptor, 0% negative. Proliferation marker Ki67 at 12%. HER2 negative by immunohistochemistry (1+). 4. Lymph node, left axilla  - metastatic carcinoma with extra capsular extension (1/1)  2. Right breast, 9:30  - invasive ductal carcinoma, grade 1  - morphologically dissimilar from left breast carcinoma  - estrogen receptor, 100% positive with strong staining intensity and progesterone receptor, 0% negative. Proliferation marker Ki67 at 5%. HER2 negative by immunohistochemistry (1+). 3. Right breast, 12 o'clock  - invasive and in situ mammary carcinoma, grade 1-2   - vascular calcifications  - morphologically dissimilar from other right breast carcinoma  - estrogen receptor, 100% positive with strong staining intensity and progesterone receptor, 0% negative. Proliferation marker Ki67 at 5%. HER2 negative by immunohistochemistry (1+).   The patient's subsequent history is as detailed below.   PAST MEDICAL HISTORY: Past Medical History:  Diagnosis Date  . Arthritis   . Hypertension     PAST SURGICAL HISTORY: Past Surgical History:  Procedure Laterality Date  . SHOULDER SURGERY     Right  Status post  total abdominal hysterectomy with bilateral salpingo-oophorectomy, status post appendectomy, status post tonsillectomy.   FAMILY HISTORY: Family History  Adopted: Yes  The patient is adopted  and has no information on her parents siblings or other blood relatives   GYNECOLOGIC HISTORY:  No LMP recorded. Patient is postmenopausal. Menarche: Does not recall Age at first live birth: 86 years old [place] P 4 LMP 68s Hysterectomy?  Yes BSO?  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  Her children are [name], who lives in [place] and works for [name]) lives in [place] and runs his own business, and [name]") who lives in [place] and works for [employer]  He is also training his children to box.  Son [name] died from hepatitis C at age 22.  The patient has 11 grandchildren and more great grandchildren and great great-grandchildren than she can count.  She attends a local [place]: Not in place.  At the 05/28/2019 visit the patient was given the appropriate documents to complete and notarized at her discretion.   HEALTH MAINTENANCE: Social History   Tobacco Use  . Smoking status: Never Smoker  . Smokeless tobacco: Never Used  Substance Use Topics  . Alcohol use: No  . Drug use: No    Frequency: 3.0 times per week     Colonoscopy: Never  PAP: Remote  Bone density: Never   No Known Allergies  Current Outpatient Medications  Medication Sig Dispense Refill  . amoxicillin (AMOXIL) 500 MG tablet Take 1 tablet (500 mg total) by mouth 2 (two) times daily. 10 tablet 0  . anastrozole (ARIMIDEX) 1 MG tablet Take 1 tablet (1 mg total) by mouth daily. 90 tablet 4  . aspirin 325 MG tablet Take 325 mg by mouth 2 (two) times daily.    . Calcium Carbonate-Vit D-Min (CALCIUM 600 + MINERALS) 600-200 MG-UNIT TABS Take 1-2 tablets by mouth daily. Take 2 tablets (1200 mg) in the am and Take 1 tablet (600 mg) at the pm.    . esomeprazole (NEXIUM) 20 MG capsule Take 20 mg by mouth daily as needed (acid reflux).    . gabapentin  (NEURONTIN) 300 MG capsule Take 300 mg by mouth 3 (three) times daily.    Marland Kitchen losartan-hydrochlorothiazide (HYZAAR) 100-12.5 MG per tablet Take 1 tablet by mouth daily.    . magnesium oxide (MAG-OX) 400 MG tablet Take 400 mg by mouth daily.    Marland Kitchen omeprazole (PRILOSEC) 20 MG capsule Take 1 capsule (20 mg total) by mouth daily. 30 capsule 0  . palbociclib (IBRANCE) 100 MG tablet Take 1 tablet (100 mg total) by mouth daily. Take for 21 days on, 7 days off, repeat every 28 days. 21 tablet 6  . pravastatin (PRAVACHOL) 20 MG tablet Take 20 mg by mouth daily.    . vitamin B-12 (CYANOCOBALAMIN) 1000 MCG tablet Take 1,000 mcg by mouth daily.     No current facility-administered medications for this visit.    OBJECTIVE: African-American woman examined in a wheelchair  There were no vitals filed for this visit.   There is no height or weight on file to calculate BMI.   Wt Readings from Last 3 Encounters:  09/13/19 121 lb 1.6 oz (54.9 kg)  08/16/19 121 lb (54.9 kg)  07/23/19 120 lb 8 oz (54.7 kg)  ECOG FS:2 - Symptomatic, <  50% confined to bed GENERAL: Patient is a well appearing female in no acute distress HEENT:  Sclerae anicteric.  Mask in place. Neck is supple.  NODES:  No cervical, supraclavicular, or axillary lymphadenopathy palpated.  BREAST EXAM:  Left breast and axillary mass is softer, right mass softer and smaller--no progression noted LUNGS:  Clear to auscultation bilaterally.  No wheezes or rhonchi. HEART:  Regular rate and rhythm. No murmur appreciated. ABDOMEN:  Soft, nontender.  Positive, normoactive bowel sounds. No organomegaly palpated. MSK:  No focal spinal tenderness to palpation. Full range of motion bilaterally in the upper extremities. EXTREMITIES:  No peripheral edema.   SKIN:  Clear with no obvious rashes or skin changes. No nail dyscrasia. NEURO:  Nonfocal. Well oriented.  Appropriate affect.      Left beast 05/28/2019    Right breast, 05/28/2019    LAB  RESULTS:  CMP     Component Value Date/Time   NA 144 09/13/2019 1129   K 4.2 09/13/2019 1129   CL 107 09/13/2019 1129   CO2 26 09/13/2019 1129   GLUCOSE 80 09/13/2019 1129   BUN 19 09/13/2019 1129   CREATININE 1.15 (H) 09/13/2019 1129   CALCIUM 10.2 09/13/2019 1129   PROT 7.1 09/13/2019 1129   ALBUMIN 4.1 09/13/2019 1129   AST 18 09/13/2019 1129   ALT 11 09/13/2019 1129   ALKPHOS 43 09/13/2019 1129   BILITOT 0.7 09/13/2019 1129   GFRNONAA 43 (L) 09/13/2019 1129   GFRAA 50 (L) 09/13/2019 1129    No results found for: TOTALPROTELP, ALBUMINELP, A1GS, A2GS, BETS, BETA2SER, GAMS, MSPIKE, SPEI  Lab Results  Component Value Date   WBC 3.1 (L) 09/13/2019   NEUTROABS 1.8 09/13/2019   HGB 9.8 (L) 09/13/2019   HCT 30.5 (L) 09/13/2019   MCV 103.7 (H) 09/13/2019   PLT 207 09/13/2019    No results found for: LABCA2  No components found for: OEUMPN361  No results for input(s): INR in the last 168 hours.  No results found for: LABCA2  No results found for: WER154  No results found for: MGQ676  No results found for: PPJ093  No results found for: CA2729  No components found for: HGQUANT  No results found for: CEA1 / No results found for: CEA1   No results found for: AFPTUMOR  No results found for: CHROMOGRNA  No results found for: KPAFRELGTCHN, LAMBDASER, KAPLAMBRATIO (kappa/lambda light chains)  No results found for: HGBA, HGBA2QUANT, HGBFQUANT, HGBSQUAN (Hemoglobinopathy evaluation)   No results found for: LDH  No results found for: IRON, TIBC, IRONPCTSAT (Iron and TIBC)  No results found for: FERRITIN  Urinalysis    Component Value Date/Time   COLORURINE YELLOW 09/13/2019 1244   APPEARANCEUR CLOUDY (A) 09/13/2019 1244   LABSPEC 1.017 09/13/2019 1244   PHURINE 5.0 09/13/2019 [place] 09/13/2019 1244   HGBUR SMALL (A) 09/13/2019 [place] 09/13/2019 [place] 09/13/2019 1244   PROTEINUR 30 (A)  09/13/2019 1244   UROBILINOGEN 0.2 07/05/2012 1610   NITRITE NEGATIVE 09/13/2019 1244   LEUKOCYTESUR LARGE (A) 09/13/2019 1244     STUDIES: US BREAST LTD UNI LEFT INC AXILLA  Result Date: 10/04/2019 CLINICAL DATA:  Assess response to neoadjuvant chemotherapy. Patient has bilateral breast carcinoma diagnosed with ultrasound-guided core needle biopsies on 05/17/2019. EXAM: ULTRASOUND OF THE BILATERAL BREAST COMPARISON:  Previous exam(s). FINDINGS: Targeted left breast ultrasound is performed, showing the irregular hypoechoic mass the left breast between 12 and 2 o'clock,  3 cm the nipple, currently measuring 3.3 x 1.8 x 3.4 cm, previously 4.9 x 2.5 x 4.7 cm. In the left axilla, enlarged/abnormal lymph nodes show improvement, most evident of the biopsied node, currently measuring 1.8 x 0.9 x 1.5 cm, previously 2.5 x 1.5 x 1.8 cm. No new abnormal or enlarged lymph nodes. Targeted right breast ultrasound is performed, showing a hypoechoic irregular mass at 12 o'clock, 3 cm the nipple, measuring 11 x 8 x 10 mm, previously 12 x 9 x 10 mm. At 9:30 o'clock, 3 cm the nipple, a less well-defined irregular hypoechoic mass currently measures 2.4 x 1.1 x 1.8 cm, previously 2.5 x 1.5 x 2.1 cm. There are no enlarged or abnormal right axillary lymph nodes, which is unchanged. IMPRESSION: 1. Positive response to neoadjuvant chemotherapy, with interval decrease in sizes of the bilateral breast carcinomas and left axillary metastatic lymphadenopathy, as detailed above. No new abnormalities. RECOMMENDATION: 1. Continue treatment as planned for the bilateral breast carcinomas and left axillary metastatic lymphadenopathy. I have discussed the findings and recommendations with the patient. If applicable, a reminder letter will be sent to the patient regarding the next appointment. BI-RADS CATEGORY  6: Known biopsy-proven malignancy. Electronically Signed   By: Lajean Manes M.D.   On: 10/04/2019 13:03   US BREAST LTD UNI RIGHT  INC AXILLA  Result Date: 10/04/2019 CLINICAL DATA:  Assess response to neoadjuvant chemotherapy. Patient has bilateral breast carcinoma diagnosed with ultrasound-guided core needle biopsies on 05/17/2019. EXAM: ULTRASOUND OF THE BILATERAL BREAST COMPARISON:  Previous exam(s). FINDINGS: Targeted left breast ultrasound is performed, showing the irregular hypoechoic mass the left breast between 12 and 2 o'clock, 3 cm the nipple, currently measuring 3.3 x 1.8 x 3.4 cm, previously 4.9 x 2.5 x 4.7 cm. In the left axilla, enlarged/abnormal lymph nodes show improvement, most evident of the biopsied node, currently measuring 1.8 x 0.9 x 1.5 cm, previously 2.5 x 1.5 x 1.8 cm. No new abnormal or enlarged lymph nodes. Targeted right breast ultrasound is performed, showing a hypoechoic irregular mass at 12 o'clock, 3 cm the nipple, measuring 11 x 8 x 10 mm, previously 12 x 9 x 10 mm. At 9:30 o'clock, 3 cm the nipple, a less well-defined irregular hypoechoic mass currently measures 2.4 x 1.1 x 1.8 cm, previously 2.5 x 1.5 x 2.1 cm. There are no enlarged or abnormal right axillary lymph nodes, which is unchanged. IMPRESSION: 1. Positive response to neoadjuvant chemotherapy, with interval decrease in sizes of the bilateral breast carcinomas and left axillary metastatic lymphadenopathy, as detailed above. No new abnormalities. RECOMMENDATION: 1. Continue treatment as planned for the bilateral breast carcinomas and left axillary metastatic lymphadenopathy. I have discussed the findings and recommendations with the patient. If applicable, a reminder letter will be sent to the patient regarding the next appointment. BI-RADS CATEGORY  6: Known biopsy-proven malignancy. Electronically Signed   By: Lajean Manes M.D.   On: 10/04/2019 13:03     ELIGIBLE FOR AVAILABLE RESEARCH PROTOCOL: AET  ASSESSMENT: 86 y.o. [place] woman status post bilateral breast biopsies on 05/17/2019 as follows:  (a) on the left upper outer quadrant, a  clinical T2 N2, stage IIIA invasive ductal carcinoma,grade 2 or 3, estrogen receptor positive, progesterone receptor negative, with no HER-2 amplification and an MIB-1 of 12%  (b) on the right overlapping sites, 2 morphologically distinct invasive ductal carcinomas, both clinically T1c N0, stage IA, grade 1 or 2, estrogen receptor positive, progesterone receptor negative, with no HER-2 amplification and an MIB-1  of 5%  (c) CT of the chest abdomen and pelvis 06/10/2019 shows left axillary, retropectoral and supraclavicular lymph nodes, and a 4.1 cm left breast mass; no right axillary adenopathy; 0.6 cm nonspecific left lower lobe pulmonary nodule; no liver or bone lesions.  (d) bone scan 06/26/2019--no evidence of skeletal metastases    (1) neoadjuvant anastrozole started 05/28/2019, palbociclib added 05/30/2019 125 mg a day, 21 on 7 off  (a) palbociclib dose decreased cycle 2 (May 2021) to 100 mg daily, 21/7  (b) bilateral breast and axilla ultrasonography 10/04/2019 shows a good initial response to treatment  (2) genetics testing declined  (3) definitive surgery pending  (4) adjuvant radiation   PLAN: Caera continues on treatment with anastrozole and Palbociclib.  Her labs remain stable, most specifically her Mona.  She will continue this.   She met with Dr. [name] who reviewed her ultrasound results with her in detail.  He recommended that she get back in with Dr. Georgette Dover to discuss surgery after Thanksgiving.  I sent Dr. Georgette Dover a message about this today.    Tashae will continue her current treatment as she is toelrating it well.  We will continue to check her labs every four weeks, and we will see her back in 8 weeks, and she will see Dr. [name] in 12 weeks.    I collected a urinalysis on [place] to evaluate her recurrent dysuria.  WE will call her with the results.  She knows to call for any questions that may arise between now and her next appointment.  We are happy to see her sooner if  needed.   Total encounter time 30 minutes.Wilber Bihari, NP 11/01/19 11:09 AM Medical Oncology and Hematology [facility] [place], [place] [address] Tel. [phone]    Fax. [phone]   ADDENDUM: Franci is now just about 6 months into her neoadjuvant hormonal/targeted therapy.  The imaging studies 10/04/2019 shows a good initial response.  She now may proceed to surgery at any point.  She will see Dr. Georgette Dover to discuss that  The family question whether surgery was needed.  They understand now that we cannot expect a cure without surgery  Postop we will likely continue the anastrozole alone.  For now we are continuing treatment with both anastrozole and palbociclib as before.  I personally saw this patient and performed a substantive portion of this encounter with the listed APP documented above.   Chauncey Cruel, MD Medical Oncology and Hematology [facility] [address] [place], [place] [address] Tel. [phone]    Fax. [phone]   *Total Encounter Time as defined by the Centers for Medicare and Medicaid Services includes, in addition to the face-to-face time of a patient visit (documented in the note above) non-face-to-face time: obtaining and reviewing outside history, ordering and reviewing medications, tests or procedures, care coordination (communications with other health care professionals or caregivers) and documentation in the medical record.

## 2019-11-02 LAB — URINE CULTURE: Culture: NO GROWTH

## 2019-11-04 ENCOUNTER — Encounter: Payer: Self-pay | Admitting: *Deleted

## 2019-11-07 MED FILL — IBRANCE 100 MG TABS: 100 | 28 days supply | Qty: 21 | Fill #5

## 2019-11-15 ENCOUNTER — Ambulatory Visit: Payer: Self-pay | Admitting: Surgery

## 2019-11-15 DIAGNOSIS — C50411 Malignant neoplasm of upper-outer quadrant of right female breast: Secondary | ICD-10-CM | POA: Diagnosis not present

## 2019-11-15 DIAGNOSIS — Z170 Estrogen receptor positive status [ER+]: Secondary | ICD-10-CM | POA: Diagnosis not present

## 2019-11-15 DIAGNOSIS — C50412 Malignant neoplasm of upper-outer quadrant of left female breast: Secondary | ICD-10-CM | POA: Diagnosis not present

## 2019-11-15 NOTE — H&P (Signed)
History of Present Illness Michelle [name]. Michelle Keltner MD; 11/15/2019 12:59 PM) The patient is a 86 year old female who presents with breast cancer. The patient is a 86 year old female who presents with breast cancer. Referred by Dr. [name] for bilateral breast cancers Initially seen in April 2021  This is a 86 year old female with a past medical history significant for pulmonary fibrosis, GERD, hypertension, and arthritis who presents after never having had a previous mammogram. She noticed a palpable mass developing in the lateral left breast that was causing some discomfort. She brought this to the attention of her primary care physician. He sent her for mammogram and ultrasounds. She had 2 masses in the right breast, one mass in the left lateral breast and another area in her left axilla. 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 The lymph node was positive for metastatic carcinoma with extracapsular extension. ER strongly positive, PR is negative, Ki-67 5%, HER-2 negative. The patient is referred to [name] for surgical discussion. She is accompanied by her son and daughter.  The patient does live by herself and is able to do some light housework. However her family checks on her about every day. She has limited strength for mobility. She does use a walker and is currently in a wheelchair for this office visit because she felt tired. She denies any family history of breast cancer.  Pathology revealed AT LEAST GRADE II INVASIVE MAMMARY CARCINOMA, MAMMARY CARCINOMA IN SITU of the LEFT breast, 12 o'clock-2 o'clock. This was found to be concordant by Dr. Curlene Dolphin.  Pathology revealed METASTATIC CARCINOMA IN 1 OF 1 LYMPH NODE (1/1), WITH EXTRA CAPSULAR EXTENSION of the LEFT inferior axilla. This was found to be concordant by Dr. Curlene Dolphin  Pathology revealed GRADE I INVASIVE DUCTAL CARCINOMA of the RIGHT breast, 9:30 o'clock, 3cmfn. This was found  to be concordant by Dr. Curlene Dolphin.  Pathology revealed GRADE I-II INVASIVE AND IN SITU MAMMARY CARCINOMA, VASCULAR CALCIFICATIONS of the RIGHT breast, 12 o'clock, 3cm . This was found to be concordant by Dr. Curlene Dolphin.  She was seen in consultation by Dr. [name] and has been managed with neoadjuvant antiestrogen therapy. She has been tolerating anastrozole with no problems. She is also on palbocicilib and seems to be tolerating that well. She remains independent and lives alone. She is accompanied by her son and daughter. Recently she had ultrasound imaging that showed some decrease in the size of both tumors. The biopsied lymph node in the left axilla is also smaller. She is referred back to [name] to discuss surgical options for treatment. Oncology asks [name] to consider surgery in early December.   EXAM: ULTRASOUND OF THE BILATERAL BREAST  COMPARISON: Previous exam(s).  FINDINGS: Targeted left breast ultrasound is performed, showing the irregular hypoechoic mass the left breast between 12 and 2 o'clock, 3 cm the nipple, currently measuring 3.3 x 1.8 x 3.4 cm, previously 4.9 x 2.5 x 4.7 cm. In the left axilla, enlarged/abnormal lymph nodes show improvement, most evident of the biopsied node, currently measuring 1.8 x 0.9 x 1.5 cm, previously 2.5 x 1.5 x 1.8 cm. No new abnormal or enlarged lymph nodes.  Targeted right breast ultrasound is performed, showing a hypoechoic irregular mass at 12 o'clock, 3 cm the nipple, measuring 11 x 8 x 10 mm, previously 12 x 9 x 10 mm. At 9:30 o'clock, 3 cm the nipple, a less well-defined irregular hypoechoic mass currently measures 2.4 x 1.1 x  1.8 cm, previously 2.5 x 1.5 x 2.1 cm. There are no enlarged or abnormal right axillary lymph nodes, which is unchanged.  IMPRESSION: 1. Positive response to neoadjuvant chemotherapy, with interval decrease in sizes of the bilateral breast carcinomas and left axillary metastatic lymphadenopathy, as  detailed above. No new abnormalities.  RECOMMENDATION: 1. Continue treatment as planned for the bilateral breast carcinomas and left axillary metastatic lymphadenopathy.  I have discussed the findings and recommendations with the patient. If applicable, a reminder letter will be sent to the patient regarding the next appointment.  BI-RADS CATEGORY 6: Known biopsy-proven malignancy.   Electronically Signed By: Lajean Manes M.D. On: 10/04/2019 13:03   Problem List/Past Medical Rodman Key K. Donoven Pett, MD; 11/15/2019 1:01 PM) BREAST CANCER (C50.919) MALIGNANT NEOPLASM OF UPPER-OUTER QUADRANT OF BOTH BREASTS IN FEMALE, ESTROGEN RECEPTOR POSITIVE (C50.411, C50.412)  Past Surgical History (Tangee Marszalek K. Eligah Anello, MD; 11/15/2019 1:01 PM) Breast Biopsy Bilateral. Hysterectomy (not due to cancer) - Complete Shoulder Surgery Left.  Diagnostic Studies History Michelle [name]. Garnett Rekowski, MD; 11/15/2019 1:01 PM) Colonoscopy never Mammogram within last year Pap Smear >5 years ago  Allergies Darden Palmer, RMA; 11/15/2019 11:16 AM) No Known Drug Allergies [05/27/2019]: Allergies Reconciled  Medication History Michelle [name]. Garik Diamant, MD; 11/15/2019 1:01 PM) Anastrozole (1MG Tablet, Oral) Active. Gabapentin (100MG Capsule, Oral) Active. Pravastatin Sodium (20MG Tablet, Oral) Active. Calcium (500MG Tablet, Oral) Active. Tamsulosin HCl (0.4MG Capsule, Oral) Active. Vitamin D (400UNIT Tablet, Oral) Active. Esomeprazole Magnesium (20MG Packet, Oral) Active. Losartan Potassium-HCTZ (100-12.5MG Tablet, Oral) Active.  Social History Michelle [name]. Torien Ramroop, MD; 11/15/2019 1:01 PM) No alcohol use No caffeine use No drug use Tobacco use Never smoker.  Pregnancy / Birth History Michelle [name]. Damiean Lukes, MD; 11/15/2019 1:01 PM) Durenda Age 6 Maternal age 35-20 Para 4  Other Problems Michelle [name]. Vasilisa Vore, MD; 11/15/2019 1:01 PM) Arthritis Back Pain Bladder Problems Breast Cancer Gastroesophageal Reflux  Disease High blood pressure Lump In Breast    Vitals Lattie Haw Caldwell RMA; 11/15/2019 11:17 AM) 11/15/2019 11:17 AM Weight: 122.13 lb Height: 60in Body Surface Area: 1.51 m Body Mass Index: 23.85 kg/m  Temp.: 98.81F  Pulse: 79 (Regular)  P.OX: 97% (Room air) BP: 142/70(Sitting, Left Arm, Standard)        Physical Exam Rodman Key K. Adeola Dennen MD; 11/15/2019 1:00 PM)  The physical exam findings are as follows: Note:GENERAL: Patient is a well appearing female in no acute distress HEENT: Sclerae anicteric. Mask in place. Neck is supple. NODES: No cervical, supraclavicular, or axillary lymphadenopathy palpated. BREAST EXAM: Left breast and axillary mass is softer, right mass softer and smaller--no progression noted LUNGS: Clear to auscultation bilaterally. No wheezes or rhonchi. HEART: Regular rate and rhythm. No murmur appreciated. ABDOMEN: Soft, nontender. Positive, normoactive bowel sounds. No organomegaly palpated. MSK: No focal spinal tenderness to palpation. Full range of motion bilaterally in the upper extremities. EXTREMITIES: No peripheral edema. SKIN: Clear with no obvious rashes or skin changes. No nail dyscrasia. NEURO: Nonfocal. Well oriented. Appropriate affect.    Assessment & Plan Michelle [name]. Bill Mcvey MD; 11/15/2019 1:01 PM)  MALIGNANT NEOPLASM OF UPPER-OUTER QUADRANT OF BOTH BREASTS IN FEMALE, ESTROGEN RECEPTOR POSITIVE (C50.411)  Current Plans Schedule for Surgery - Bilateral mastectomies with left axillary left node dissection. The surgical procedure has been discussed with the patient. Potential risks, benefits, alternative treatments, and expected outcomes have been explained. All of the patient's questions at this time have been answered. The likelihood of reaching the patient's treatment goal is good. The patient understand the proposed surgical procedure and wishes to  proceed. Note:We discussed her surgical options. Due to the bulky presentation  of the disease as well as the left axillary metastatic lymph nodes, I recommended bilateral mastectomies with a limited left axillary lymph node dissection to remove all palpable disease. She will likely continue her adjuvant antiestrogen therapy after surgery. She is not a good candidate for chemotherapy. Her family wants to discuss this further with oncology at their next appointment. We will go ahead and tentatively scheduled the case for early December. This plan can always be adjusted.  Michelle [name]. Georgette Dover, MD, [facility]  General/ Trauma Surgery   11/15/2019 1:01 PM

## 2019-11-18 ENCOUNTER — Other Ambulatory Visit: Payer: Self-pay | Admitting: Oncology

## 2019-11-28 ENCOUNTER — Other Ambulatory Visit: Payer: Self-pay | Admitting: *Deleted

## 2019-11-28 DIAGNOSIS — C50412 Malignant neoplasm of upper-outer quadrant of left female breast: Secondary | ICD-10-CM

## 2019-11-28 DIAGNOSIS — Z170 Estrogen receptor positive status [ER+]: Secondary | ICD-10-CM

## 2019-11-29 ENCOUNTER — Inpatient Hospital Stay: Payer: Medicare HMO | Attending: Adult Health

## 2019-11-29 ENCOUNTER — Other Ambulatory Visit: Payer: Self-pay

## 2019-11-29 DIAGNOSIS — Z170 Estrogen receptor positive status [ER+]: Secondary | ICD-10-CM | POA: Insufficient documentation

## 2019-11-29 DIAGNOSIS — C50812 Malignant neoplasm of overlapping sites of left female breast: Secondary | ICD-10-CM | POA: Diagnosis present

## 2019-11-29 DIAGNOSIS — C50811 Malignant neoplasm of overlapping sites of right female breast: Secondary | ICD-10-CM | POA: Insufficient documentation

## 2019-11-29 DIAGNOSIS — Z79811 Long term (current) use of aromatase inhibitors: Secondary | ICD-10-CM | POA: Diagnosis not present

## 2019-11-29 DIAGNOSIS — Z9013 Acquired absence of bilateral breasts and nipples: Secondary | ICD-10-CM | POA: Diagnosis not present

## 2019-11-29 DIAGNOSIS — C50412 Malignant neoplasm of upper-outer quadrant of left female breast: Secondary | ICD-10-CM | POA: Insufficient documentation

## 2019-11-29 DIAGNOSIS — C50411 Malignant neoplasm of upper-outer quadrant of right female breast: Secondary | ICD-10-CM | POA: Diagnosis present

## 2019-11-29 LAB — CMP (CANCER CENTER ONLY)
ALT: 10 U/L (ref 0–44)
AST: 19 U/L (ref 15–41)
Albumin: 4 g/dL (ref 3.5–5.0)
Alkaline Phosphatase: 50 U/L (ref 38–126)
Anion gap: 4 — ABNORMAL LOW (ref 5–15)
BUN: 19 mg/dL (ref 8–23)
CO2: 32 mmol/L (ref 22–32)
Calcium: 9.8 mg/dL (ref 8.9–10.3)
Chloride: 105 mmol/L (ref 98–111)
Creatinine: 1.18 mg/dL — ABNORMAL HIGH (ref 0.44–1.00)
GFR, Estimated: 41 mL/min — ABNORMAL LOW (ref >60)
Glucose, Bld: 86 mg/dL (ref 70–99)
Potassium: 4.1 mmol/L (ref 3.5–5.1)
Sodium: 141 mmol/L (ref 135–145)
Total Bilirubin: 0.8 mg/dL (ref 0.3–1.2)
Total Protein: 7.2 g/dL (ref 6.5–8.1)

## 2019-11-29 LAB — CBC WITH DIFFERENTIAL (CANCER CENTER ONLY)
Abs Immature Granulocytes: 0.01 10*3/uL (ref 0.00–0.07)
Basophils Absolute: 0.1 10*3/uL (ref 0.0–0.1)
Basophils Relative: 2 %
Eosinophils Absolute: 0.1 10*3/uL (ref 0.0–0.5)
Eosinophils Relative: 4 %
HCT: 30.3 % — ABNORMAL LOW (ref 36.0–46.0)
Hemoglobin: 9.8 g/dL — ABNORMAL LOW (ref 12.0–15.0)
Immature Granulocytes: 0 %
Lymphocytes Relative: 29 %
Lymphs Abs: 0.9 10*3/uL (ref 0.7–4.0)
MCH: 33 pg (ref 26.0–34.0)
MCHC: 32.3 g/dL (ref 30.0–36.0)
MCV: 102 fL — ABNORMAL HIGH (ref 80.0–100.0)
Monocytes Absolute: 0.3 10*3/uL (ref 0.1–1.0)
Monocytes Relative: 9 %
Neutro Abs: 1.7 10*3/uL (ref 1.7–7.7)
Neutrophils Relative %: 56 %
Platelet Count: 309 10*3/uL (ref 150–400)
RBC: 2.97 MIL/uL — ABNORMAL LOW (ref 3.87–5.11)
RDW: 13.7 % (ref 11.5–15.5)
WBC Count: 3 10*3/uL — ABNORMAL LOW (ref 4.0–10.5)
nRBC: 0 % (ref 0.0–0.2)

## 2019-12-06 MED FILL — IBRANCE 100 MG TABS: 100 | 28 days supply | Qty: 21 | Fill #6

## 2019-12-26 ENCOUNTER — Other Ambulatory Visit: Payer: Self-pay | Admitting: *Deleted

## 2019-12-26 DIAGNOSIS — C50412 Malignant neoplasm of upper-outer quadrant of left female breast: Secondary | ICD-10-CM

## 2019-12-26 DIAGNOSIS — Z170 Estrogen receptor positive status [ER+]: Secondary | ICD-10-CM

## 2019-12-27 ENCOUNTER — Other Ambulatory Visit: Payer: Self-pay

## 2019-12-27 ENCOUNTER — Inpatient Hospital Stay: Payer: Medicare HMO | Attending: Adult Health

## 2019-12-27 ENCOUNTER — Encounter: Payer: Self-pay | Admitting: Adult Health

## 2019-12-27 ENCOUNTER — Inpatient Hospital Stay (HOSPITAL_BASED_OUTPATIENT_CLINIC_OR_DEPARTMENT_OTHER): Payer: Medicare HMO | Admitting: Adult Health

## 2019-12-27 VITALS — BP 147/80 | HR 80 | Temp 97.4°F | Resp 17 | Ht 61.0 in | Wt 115.6 lb

## 2019-12-27 DIAGNOSIS — C50811 Malignant neoplasm of overlapping sites of right female breast: Secondary | ICD-10-CM

## 2019-12-27 DIAGNOSIS — L299 Pruritus, unspecified: Secondary | ICD-10-CM | POA: Insufficient documentation

## 2019-12-27 DIAGNOSIS — C50412 Malignant neoplasm of upper-outer quadrant of left female breast: Secondary | ICD-10-CM

## 2019-12-27 DIAGNOSIS — Z79811 Long term (current) use of aromatase inhibitors: Secondary | ICD-10-CM | POA: Diagnosis not present

## 2019-12-27 DIAGNOSIS — I10 Essential (primary) hypertension: Secondary | ICD-10-CM | POA: Insufficient documentation

## 2019-12-27 DIAGNOSIS — Z79899 Other long term (current) drug therapy: Secondary | ICD-10-CM | POA: Diagnosis not present

## 2019-12-27 DIAGNOSIS — Z7982 Long term (current) use of aspirin: Secondary | ICD-10-CM | POA: Diagnosis not present

## 2019-12-27 DIAGNOSIS — C50812 Malignant neoplasm of overlapping sites of left female breast: Secondary | ICD-10-CM | POA: Diagnosis present

## 2019-12-27 DIAGNOSIS — Z170 Estrogen receptor positive status [ER+]: Secondary | ICD-10-CM | POA: Diagnosis not present

## 2019-12-27 DIAGNOSIS — M1990 Unspecified osteoarthritis, unspecified site: Secondary | ICD-10-CM | POA: Insufficient documentation

## 2019-12-27 DIAGNOSIS — C50411 Malignant neoplasm of upper-outer quadrant of right female breast: Secondary | ICD-10-CM | POA: Diagnosis not present

## 2019-12-27 LAB — CBC WITH DIFFERENTIAL (CANCER CENTER ONLY)
Abs Immature Granulocytes: 0.01 10*3/uL (ref 0.00–0.07)
Basophils Absolute: 0.1 10*3/uL (ref 0.0–0.1)
Basophils Relative: 2 %
Eosinophils Absolute: 0.1 10*3/uL (ref 0.0–0.5)
Eosinophils Relative: 2 %
HCT: 30.6 % — ABNORMAL LOW (ref 36.0–46.0)
Hemoglobin: 10.1 g/dL — ABNORMAL LOW (ref 12.0–15.0)
Immature Granulocytes: 0 %
Lymphocytes Relative: 29 %
Lymphs Abs: 1.1 10*3/uL (ref 0.7–4.0)
MCH: 33.6 pg (ref 26.0–34.0)
MCHC: 33 g/dL (ref 30.0–36.0)
MCV: 101.7 fL — ABNORMAL HIGH (ref 80.0–100.0)
Monocytes Absolute: 0.3 10*3/uL (ref 0.1–1.0)
Monocytes Relative: 7 %
Neutro Abs: 2.2 10*3/uL (ref 1.7–7.7)
Neutrophils Relative %: 60 %
Platelet Count: 290 10*3/uL (ref 150–400)
RBC: 3.01 MIL/uL — ABNORMAL LOW (ref 3.87–5.11)
RDW: 13.2 % (ref 11.5–15.5)
WBC Count: 3.7 10*3/uL — ABNORMAL LOW (ref 4.0–10.5)
nRBC: 0 % (ref 0.0–0.2)

## 2019-12-27 LAB — CMP (CANCER CENTER ONLY)
ALT: 8 U/L (ref 0–44)
AST: 18 U/L (ref 15–41)
Albumin: 4.4 g/dL (ref 3.5–5.0)
Alkaline Phosphatase: 47 U/L (ref 38–126)
Anion gap: 11 (ref 5–15)
BUN: 30 mg/dL — ABNORMAL HIGH (ref 8–23)
CO2: 27 mmol/L (ref 22–32)
Calcium: 9.8 mg/dL (ref 8.9–10.3)
Chloride: 103 mmol/L (ref 98–111)
Creatinine: 1.33 mg/dL — ABNORMAL HIGH (ref 0.44–1.00)
GFR, Estimated: 39 mL/min — ABNORMAL LOW (ref >60)
Glucose, Bld: 97 mg/dL (ref 70–99)
Potassium: 3.7 mmol/L (ref 3.5–5.1)
Sodium: 141 mmol/L (ref 135–145)
Total Bilirubin: 1.3 mg/dL — ABNORMAL HIGH (ref 0.3–1.2)
Total Protein: 7.5 g/dL (ref 6.5–8.1)

## 2019-12-27 MED ORDER — TRIAMCINOLONE ACETONIDE 0.5 % EX OINT: 1.0000 "application " | TOPICAL_OINTMENT | Freq: Two times a day (BID) | CUTANEOUS | 0 refills | Status: AC

## 2019-12-27 NOTE — Progress Notes (Signed)
[place]  [phone] [phone]     ID: Michelle [name] DOB: 09/25/1930  MR#: [id number]  RDE#:[id number]  Patient Care Team: [name], MD as PCP - General (Internal Medicine) Magrinat, Virgie Dad, MD as Consulting Physician (Oncology) Donnie Mesa, MD as Consulting Physician (General Surgery) Mauro Kaufmann, RN as Oncology Nurse Navigator Rockwell Germany, RN as Oncology Nurse Navigator Scot Dock, NP OTHER MD:  CHIEF COMPLAINT: Bilateral estrogen receptor positive breast cancers  CURRENT TREATMENT: neoadjuvant anti-estrogens and palbociclib   INTERVAL HISTORY: Rukiya returns today for follow up of her bilateral estrogen receptor positive breast cancers accompanied by her daughter [name] and her son.       She is taking Anastrozole daily with good tolerance.  She has no arthralgias, noticeable hot flashes.    She also continues on palbocicilib 3 weeks on and 1 week off with good tolerance.  She is at the 173m dose, and her ANC is normal today.  She is almost finished with her three weeks on, but she cannot recall how many she has left.  BMaryoriunderwent breast ultrasound on 10/04/2019 which showed reduction in her bilateral breast cancer, by about 1cm  In each mass.  She has bilateral mastectomies scheduled for 01/28/2020.   REVIEW OF SYSTEMS: BNuranotes that she is doing well today.  She notes she has some itching on her back that has been present x 2 months and she wants to know what to do about it.  She also has some indigestion that makes her mouth taste funny from time to time.  She is taking Prilosec daily.  She notes a small ulcer on her lower lip.  She denies any mouth sores, mucositis, bowel/bladder changes, cough, shortness of breath or any other issues.  A detailed ROS was otherwise non contributory.     HISTORY OF CURRENT ILLNESS: From the original intake note:  BANALYSSE QUINONEZherself palpated a left breast  mass around January 2021, as she recalls. She underwent bilateral diagnostic mammography with tomography and bilateral breast ultrasonography [facility] showing: LEFT BREAST: breast density category B; 4.9 cm spiculated left breast mass extending from 12-2 o'clock, as well as to and likely involving overlying skin/nipple (with associated retraction); at least 8 abnormal left axillary lymph nodes;   RIGHT BREAST: two suspicious right breast masses, 1.2 cm at 12 o'clock and 2.5 cm at 9:30, approximately 2.6 cm apart; negative right axilla.  Accordingly that same day, she proceeded to biopsy of the bilateral breast areas in question. The pathology from this procedure ([id number]) showed:  1. Left breast, 12-2 o'clock  - invasive and in situ mammary carcinoma, grade 2, e-cadherin positive   - estrogen receptor, 100% positive with strong staining intensity and progesterone receptor, 0% negative. Proliferation marker Ki67 at 12%. HER2 negative by immunohistochemistry (1+). 4. Lymph node, left axilla  - metastatic carcinoma with extra capsular extension (1/1)  2. Right breast, 9:30  - invasive ductal carcinoma, grade 1  - morphologically dissimilar from left breast carcinoma  - estrogen receptor, 100% positive with strong staining intensity and progesterone receptor, 0% negative. Proliferation marker Ki67 at 5%. HER2 negative by immunohistochemistry (1+). 3. Right breast, 12 o'clock  - invasive and in situ mammary carcinoma, grade 1-2   - vascular calcifications  - morphologically dissimilar from other right breast carcinoma  - estrogen receptor, 100% positive with strong staining intensity and progesterone receptor, 0% negative. Proliferation marker Ki67 at  5%. HER2 negative by immunohistochemistry (1+).   The patient's subsequent history is as detailed below.   PAST MEDICAL HISTORY: Past Medical History:  Diagnosis Date  . Arthritis   . Hypertension     PAST SURGICAL  HISTORY: Past Surgical History:  Procedure Laterality Date  . SHOULDER SURGERY     Right  Status post total abdominal hysterectomy with bilateral salpingo-oophorectomy, status post appendectomy, status post tonsillectomy.   FAMILY HISTORY: Family History  Adopted: Yes  The patient is adopted and has no information on her parents siblings or other blood relatives   GYNECOLOGIC HISTORY:  No LMP recorded. Patient is postmenopausal. Menarche: Does not recall Age at first live birth: 86 years old [place] P 4 LMP 93s Hysterectomy?  Yes BSO?  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  Her children are [name], who lives in [place] and works for [name]) lives in [place] and runs his own business, and [name]") who lives in [place] and works for [employer]  He is also training his children to box.  Son [name] died from hepatitis C at age 72.  The patient has 11 grandchildren and more great grandchildren and great great-grandchildren than she can count.  She attends a local [place]: Not in place.  At the 05/28/2019 visit the patient was given the appropriate documents to complete and notarized at her discretion.   HEALTH MAINTENANCE: Social History   Tobacco Use  . Smoking status: Never Smoker  . Smokeless tobacco: Never Used  Substance Use Topics  . Alcohol use: No  . Drug use: No    Frequency: 3.0 times per week     Colonoscopy: Never  PAP: Remote  Bone density: Never   No Known Allergies  Current Outpatient Medications  Medication Sig Dispense Refill  . amoxicillin (AMOXIL) 500 MG tablet Take 1 tablet (500 mg total) by mouth 2 (two) times daily. 10 tablet 0  . anastrozole (ARIMIDEX) 1 MG tablet Take 1 tablet (1 mg total) by mouth daily. 90 tablet 4  . aspirin 325 MG tablet Take 325 mg by mouth 2 (two) times  daily.    . Calcium Carbonate-Vit D-Min (CALCIUM 600 + MINERALS) 600-200 MG-UNIT TABS Take 1-2 tablets by mouth daily. Take 2 tablets (1200 mg) in the am and Take 1 tablet (600 mg) at the pm.    . esomeprazole (NEXIUM) 20 MG capsule Take 20 mg by mouth daily as needed (acid reflux).    . gabapentin (NEURONTIN) 300 MG capsule Take 300 mg by mouth 3 (three) times daily.    Marland Kitchen losartan-hydrochlorothiazide (HYZAAR) 100-12.5 MG per tablet Take 1 tablet by mouth daily.    . magnesium oxide (MAG-OX) 400 MG tablet Take 400 mg by mouth daily.    Marland Kitchen omeprazole (PRILOSEC) 20 MG capsule Take 1 capsule (20 mg total) by mouth daily. 30 capsule 0  . palbociclib (IBRANCE) 100 MG tablet Take 1 tablet (100 mg total) by mouth daily. Take for 21 days on, 7 days off, repeat every 28 days. 21 tablet 6  . pravastatin (PRAVACHOL) 20 MG tablet Take 20 mg by mouth daily.    . vitamin B-12 (CYANOCOBALAMIN) 1000 MCG tablet Take 1,000 mcg by mouth daily.     No current facility-administered medications for this visit.    OBJECTIVE: African-American woman examined  in a wheelchair  Vitals:   12/27/19 1405  BP: (!) 147/80  Pulse: 80  Resp: 17  Temp: (!) 97.4 F (36.3 C)  SpO2: 98%     Body mass index is 21.84 kg/m.   Wt Readings from Last 3 Encounters:  12/27/19 115 lb 9.6 oz (52.4 kg)  11/01/19 122 lb (55.3 kg)  09/13/19 121 lb 1.6 oz (54.9 kg)  ECOG FS:2 - Symptomatic, <50% confined to bed GENERAL: Patient is a well appearing female in no acute distress HEENT:  Sclerae anicteric.  Small ulcer on lower lip, no oral thrush noted Neck is supple.  NODES:  No cervical, supraclavicular, or axillary lymphadenopathy palpated.  BREAST EXAM:  Left breast and axillary mass is softer, right mass softer and smaller--no progression noted LUNGS:  Clear to auscultation bilaterally.  No wheezes or rhonchi. HEART:  Regular rate and rhythm. + murmur ABDOMEN:  Soft, nontender.  Positive, normoactive bowel sounds. No organomegaly  palpated. MSK:  No focal spinal tenderness to palpation. Full range of motion bilaterally in the upper extremities. EXTREMITIES:  No peripheral edema.   SKIN:  There are scratch marks on her back and in her head with no other rash.  These are scabbed over.   NEURO:  Nonfocal. Well oriented.  Appropriate affect.      Left beast 05/28/2019    Right breast, 05/28/2019    LAB RESULTS:  CMP     Component Value Date/Time   NA 141 12/27/2019 1340   K 3.7 12/27/2019 1340   CL 103 12/27/2019 1340   CO2 27 12/27/2019 1340   GLUCOSE 97 12/27/2019 1340   BUN 30 (H) 12/27/2019 1340   CREATININE 1.33 (H) 12/27/2019 1340   CALCIUM 9.8 12/27/2019 1340   PROT 7.5 12/27/2019 1340   ALBUMIN 4.4 12/27/2019 1340   AST 18 12/27/2019 1340   ALT 8 12/27/2019 1340   ALKPHOS 47 12/27/2019 1340   BILITOT 1.3 (H) 12/27/2019 1340   GFRNONAA 39 (L) 12/27/2019 1340   GFRAA 47 (L) 11/01/2019 1142    No results found for: TOTALPROTELP, ALBUMINELP, A1GS, A2GS, BETS, BETA2SER, GAMS, MSPIKE, SPEI  Lab Results  Component Value Date   WBC 3.7 (L) 12/27/2019   NEUTROABS 2.2 12/27/2019   HGB 10.1 (L) 12/27/2019   HCT 30.6 (L) 12/27/2019   MCV 101.7 (H) 12/27/2019   PLT 290 12/27/2019    No results found for: LABCA2  No components found for: CXKGYJ856  No results for input(s): INR in the last 168 hours.  No results found for: LABCA2  No results found for: DJS970  No results found for: YOV785  No results found for: YIF027  No results found for: CA2729  No components found for: HGQUANT  No results found for: CEA1 / No results found for: CEA1   No results found for: AFPTUMOR  No results found for: CHROMOGRNA  No results found for: KPAFRELGTCHN, LAMBDASER, KAPLAMBRATIO (kappa/lambda light chains)  No results found for: HGBA, HGBA2QUANT, HGBFQUANT, HGBSQUAN (Hemoglobinopathy evaluation)   No results found for: LDH  No results found for: IRON, TIBC, IRONPCTSAT (Iron and  TIBC)  No results found for: FERRITIN  Urinalysis    Component Value Date/Time   COLORURINE YELLOW 11/01/2019 1300   APPEARANCEUR HAZY (A) 11/01/2019 1300   LABSPEC 1.018 11/01/2019 1300   PHURINE 5.0 11/01/2019 1300   GLUCOSEU NEGATIVE 11/01/2019 1300   HGBUR NEGATIVE 11/01/2019 1300   BILIRUBINUR NEGATIVE 11/01/2019 [place] 11/01/2019 1300  PROTEINUR NEGATIVE 11/01/2019 1300   UROBILINOGEN 0.2 07/05/2012 1610   NITRITE NEGATIVE 11/01/2019 1300   LEUKOCYTESUR MODERATE (A) 11/01/2019 1300     STUDIES: No results found.   ELIGIBLE FOR AVAILABLE RESEARCH PROTOCOL: AET  ASSESSMENT: 86 y.o. [place] woman status post bilateral breast biopsies on 05/17/2019 as follows:  (a) on the left upper outer quadrant, a clinical T2 N2, stage IIIA invasive ductal carcinoma,grade 2 or 3, estrogen receptor positive, progesterone receptor negative, with no HER-2 amplification and an MIB-1 of 12%  (b) on the right overlapping sites, 2 morphologically distinct invasive ductal carcinomas, both clinically T1c N0, stage IA, grade 1 or 2, estrogen receptor positive, progesterone receptor negative, with no HER-2 amplification and an MIB-1 of 5%  (c) CT of the chest abdomen and pelvis 06/10/2019 shows left axillary, retropectoral and supraclavicular lymph nodes, and a 4.1 cm left breast mass; no right axillary adenopathy; 0.6 cm nonspecific left lower lobe pulmonary nodule; no liver or bone lesions.  (d) bone scan 06/26/2019--no evidence of skeletal metastases    (1) neoadjuvant anastrozole started 05/28/2019, palbociclib added 05/30/2019 125 mg a day, 21 on 7 off  (a) palbociclib dose decreased cycle 2 (May 2021) to 100 mg daily, 21/7  (b) bilateral breast and axilla ultrasonography 10/04/2019 shows a good initial response to treatment  (2) genetics testing declined  (3) definitive surgery pending  (4) adjuvant radiation   PLAN: Mellissa is doing well today.  She continues on  Palbociclib and Anastrozole with good tolerance.  She will finish the Palbociclib and then stop once she finishes until after she finishes with surgery and sees Dr. [name] again.    For her itching I recommended she take Benadryl 12.23m if needed and I sent in triamcinolone cream for her to apply.  She will take tums if needed for her indigestion, and she may need to increase her Omeprazole.  She was recommended to apply lip balm to her lips, and abreva if needed.    I reviewed the above with Dr. MJana Hakimand he is in agreement.  She will return the first week in January for labs and f/u with him.  I reviewed that she should not start the Palbociclib again until the appt the week of 1/7 and she gets cleared by him.  She will continue the anastrozole daily.    She knows to call for any questions that may arise between now and her next appointment.  We are happy to see her sooner if needed.   Total encounter time 30 minutes.*Wilber Bihari NP 12/27/19 2:25 PM Medical On[facility] [place] [id number]. [phone]   Fax. [phone] *Total Encounter Time as defined by the Centers for Medicare and Medicaid Services includes, in addition to the face-to-face time of a patient visit (documented in the note above) non-face-to-face time: obtaining and reviewing outside history, ordering and reviewing medications, tests or procedures, care coordination (communications with other health care professionals or caregivers) and documentation in the medical record.

## 2019-12-30 ENCOUNTER — Telehealth: Payer: Self-pay | Admitting: Oncology

## 2019-12-30 ENCOUNTER — Encounter: Payer: Self-pay | Admitting: *Deleted

## 2019-12-30 NOTE — Telephone Encounter (Signed)
Cancelled and scheduled appts per 11/12 los. Pt's daughter declined getting new appt info and stated that it was " too much to keep up with"

## 2020-01-22 ENCOUNTER — Other Ambulatory Visit: Payer: Self-pay | Admitting: *Deleted

## 2020-01-22 DIAGNOSIS — Z170 Estrogen receptor positive status [ER+]: Secondary | ICD-10-CM

## 2020-01-22 DIAGNOSIS — C50412 Malignant neoplasm of upper-outer quadrant of left female breast: Secondary | ICD-10-CM

## 2020-01-23 ENCOUNTER — Ambulatory Visit: Payer: Medicare HMO | Admitting: Oncology

## 2020-01-23 ENCOUNTER — Encounter (HOSPITAL_COMMUNITY)
Admission: RE | Admit: 2020-01-23 | Discharge: 2020-01-23 | Disposition: A | Discharge status: Home / Self Care | Payer: Medicare HMO | Source: Ambulatory Visit | Attending: Surgery | Admitting: Surgery

## 2020-01-23 ENCOUNTER — Telehealth: Payer: Self-pay | Admitting: *Deleted

## 2020-01-23 ENCOUNTER — Other Ambulatory Visit: Payer: Self-pay

## 2020-01-23 ENCOUNTER — Encounter (HOSPITAL_COMMUNITY): Payer: Self-pay

## 2020-01-23 ENCOUNTER — Inpatient Hospital Stay: Payer: Medicare HMO | Attending: Adult Health

## 2020-01-23 DIAGNOSIS — Z01810 Encounter for preprocedural cardiovascular examination: Secondary | ICD-10-CM | POA: Diagnosis not present

## 2020-01-23 DIAGNOSIS — C50412 Malignant neoplasm of upper-outer quadrant of left female breast: Secondary | ICD-10-CM | POA: Diagnosis not present

## 2020-01-23 DIAGNOSIS — C50811 Malignant neoplasm of overlapping sites of right female breast: Secondary | ICD-10-CM | POA: Insufficient documentation

## 2020-01-23 DIAGNOSIS — Z79811 Long term (current) use of aromatase inhibitors: Secondary | ICD-10-CM | POA: Insufficient documentation

## 2020-01-23 DIAGNOSIS — Z170 Estrogen receptor positive status [ER+]: Secondary | ICD-10-CM | POA: Insufficient documentation

## 2020-01-23 DIAGNOSIS — I10 Essential (primary) hypertension: Secondary | ICD-10-CM | POA: Diagnosis not present

## 2020-01-23 HISTORY — DX: Cerebral infarction, unspecified: I63.9

## 2020-01-23 HISTORY — DX: Gastro-esophageal reflux disease without esophagitis: K21.9

## 2020-01-23 HISTORY — DX: Malignant (primary) neoplasm, unspecified: C80.1

## 2020-01-23 HISTORY — DX: Dyspnea, unspecified: R06.00

## 2020-01-23 LAB — CBC WITH DIFFERENTIAL (CANCER CENTER ONLY)
Abs Immature Granulocytes: 0.02 10*3/uL (ref 0.00–0.07)
Basophils Absolute: 0.1 10*3/uL (ref 0.0–0.1)
Basophils Relative: 2 %
Eosinophils Absolute: 0.3 10*3/uL (ref 0.0–0.5)
Eosinophils Relative: 4 %
HCT: 31.4 % — ABNORMAL LOW (ref 36.0–46.0)
Hemoglobin: 10.2 g/dL — ABNORMAL LOW (ref 12.0–15.0)
Immature Granulocytes: 0 %
Lymphocytes Relative: 24 %
Lymphs Abs: 1.5 10*3/uL (ref 0.7–4.0)
MCH: 33.1 pg (ref 26.0–34.0)
MCHC: 32.5 g/dL (ref 30.0–36.0)
MCV: 101.9 fL — ABNORMAL HIGH (ref 80.0–100.0)
Monocytes Absolute: 0.7 10*3/uL (ref 0.1–1.0)
Monocytes Relative: 12 %
Neutro Abs: 3.6 10*3/uL (ref 1.7–7.7)
Neutrophils Relative %: 58 %
Platelet Count: 415 10*3/uL — ABNORMAL HIGH (ref 150–400)
RBC: 3.08 MIL/uL — ABNORMAL LOW (ref 3.87–5.11)
RDW: 13.6 % (ref 11.5–15.5)
WBC Count: 6.2 10*3/uL (ref 4.0–10.5)
nRBC: 0 % (ref 0.0–0.2)

## 2020-01-23 LAB — CMP (CANCER CENTER ONLY)
ALT: 19 U/L (ref 0–44)
AST: 26 U/L (ref 15–41)
Albumin: 4.4 g/dL (ref 3.5–5.0)
Alkaline Phosphatase: 47 U/L (ref 38–126)
Anion gap: 11 (ref 5–15)
BUN: 18 mg/dL (ref 8–23)
CO2: 29 mmol/L (ref 22–32)
Calcium: 10.2 mg/dL (ref 8.9–10.3)
Chloride: 104 mmol/L (ref 98–111)
Creatinine: 0.94 mg/dL (ref 0.44–1.00)
GFR, Estimated: 59 mL/min — ABNORMAL LOW (ref >60)
Glucose, Bld: 85 mg/dL (ref 70–99)
Potassium: 3.8 mmol/L (ref 3.5–5.1)
Sodium: 144 mmol/L (ref 135–145)
Total Bilirubin: 0.7 mg/dL (ref 0.3–1.2)
Total Protein: 7.6 g/dL (ref 6.5–8.1)

## 2020-01-23 NOTE — Progress Notes (Addendum)
[place], [place] - [place] [address] Phone: [phone] Fax: [phone]  [place], [place] - [place] [address] Phone: [phone] Fax: [phone]      Your procedure is scheduled on Tuesday, December 14th.  Report to [facility]tr[facility] "A" at 12:30 P.M., and check in at the Admitting office.  Call this number if you have problems the morning of surgery:  [phone]  Call [phone] if you have any questions prior to your surgery date Monday-Friday 8am-4pm    Remember:  Do not eat after midnight the night before your surgery  You may drink clear liquids until 11:30 AM, the morning of your surgery.   Clear liquids allowed are: Water, Non-Citrus Juices (without pulp), Carbonated Beverages, Clear Tea, Black Coffee Only, and Gatorade    Take these medicines the morning of surgery with A SIP OF WATER   Amoxicillin   Anastrozole (Arimidex)  Esomeprazole (Nexium)  Gabapentin (Neurontin)  Omeprazole (Prilosec)  Palbociclib (Ibrance)  Pravastatin (Pravachol)   Follow your surgeon's instructions on when to stop Aspirin.  If no instructions were given by your surgeon then you will need to call the office to get those instructions.    As of today, STOP taking Aleve, Naproxen, Ibuprofen, Motrin, Advil, Goody's, BC's, all herbal medications, fish oil, and all vitamins.                       Do not wear jewelry, make up, or nail polish            Do not wear lotions, powders, perfumes, or deodorant.            Do not shave 48 hours prior to surgery.              Do not bring valuables to the hospital.            [facility] is not responsible for any belongings or valuables.  Do NOT Smoke (Tobacco/Vaping) or drink Alcohol 24 hours prior to your procedure If you use a CPAP at night, you may bring all equipment  for your overnight stay.   Contacts, glasses, dentures or bridgework may not be worn into surgery.      For patients admitted to the hospital, discharge time will be determined by your treatment team.   Patients discharged the day of surgery will not be allowed to drive home, and someone needs to stay with them for 24 hours.    Special instructions:   [facility]- Preparing For Surgery  Before surgery, you can play an important role. Because skin is not sterile, your skin needs to be as free of germs as possible. You can reduce the number of germs on your skin by washing with CHG (chlorahexidine gluconate) Soap before surgery.  CHG is an antiseptic cleaner which kills germs and bonds with the skin to continue killing germs even after washing.    Oral Hygiene is also important to reduce your risk of infection.  Remember - BRUSH YOUR TEETH THE MORNING OF SURGERY WITH YOUR REGULAR TOOTHPASTE  Please do not use if you have an allergy to CHG or antibacterial soaps. If your skin becomes reddened/irritated stop using the CHG.  Do not shave (including legs and underarms) for at least 48 hours prior to first CHG shower. It is OK to shave your  face.  Please follow these instructions carefully.   1. Shower the NIGHT BEFORE SURGERY and the MORNING OF SURGERY with CHG Soap.   2. If you chose to wash your hair, wash your hair first as usual with your normal shampoo.  3. After you shampoo, rinse your hair and body thoroughly to remove the shampoo.  4. Use CHG as you would any other liquid soap. You can apply CHG directly to the skin and wash gently with a scrungie or a clean washcloth.   5. Apply the CHG Soap to your body ONLY FROM THE NECK DOWN.  Do not use on open wounds or open sores. Avoid contact with your eyes, ears, mouth and genitals (private parts). Wash Face and genitals (private parts)  with your normal soap.   6. Wash thoroughly, paying special attention to the area where your surgery  will be performed.  7. Thoroughly rinse your body with warm water from the neck down.  8. DO NOT shower/wash with your normal soap after using and rinsing off the CHG Soap.  9. Pat yourself dry with a CLEAN TOWEL.  10. Wear CLEAN PAJAMAS to bed the night before surgery  11. Place CLEAN SHEETS on your bed the night of your first shower and DO NOT SLEEP WITH PETS.   Day of Surgery: Wear Clean/Comfortable clothing the morning of surgery Do not apply any deodorants/lotions.   Remember to brush your teeth WITH YOUR REGULAR TOOTHPASTE.   Please read over the following fact sheets that you were given.

## 2020-01-23 NOTE — Progress Notes (Addendum)
PCP - [name] Cardiologist - denies  Chest x-ray - n/a EKG - 01-23-20 ECHO - 2016  Aspirin Instructions: follow your surgeon's instructions on when to stop ASA.   ERAS Protcol - yes, no drink ordered  COVID TEST- 01-24-20   Anesthesia review: n/a  Patient denies shortness of breath, fever, cough and chest pain at PAT appointment   All instructions explained to the patient, with a verbal understanding of the material. Patient agrees to go over the instructions while at home for a better understanding. Patient also instructed to self quarantine after being tested for COVID-19. The opportunity to ask questions was provided.   Patient able to answer questions appropriately.  Daughter made aware that she will not be able to come back with mother on day of surgery, and will have to wait in the lobby.  Daughter stated understanding.

## 2020-01-23 NOTE — Telephone Encounter (Signed)
This RN was requested to speak with pt's daughter today in the lobby.  Per daughter- they thought the patient was going to be seen today due to pending surgery " and we have some questions "  This RN inquired about concerns- which the family related was to " the surgery "  " about putting her to sleep and you know- how safe that is in someone like her "  " what are the percentages of someone like our mom having the surgery and living a normal life "  This RN validated concerns but informed them these questions need to be addressed to the surgeon for best answers.  This RN did review that pt's status will be reviewed with the anesthesiologist as well we have had other patients the patient's age do very well. Stated all surgeries carry risk which will be discussed by surgeon.  This RN gave phone number for the pt's surgeon for further communication.  This note will be forwarded to her MDs for review of discussion

## 2020-01-24 ENCOUNTER — Inpatient Hospital Stay (HOSPITAL_COMMUNITY): Admission: RE | Admit: 2020-01-24 | Payer: Medicare HMO | Source: Ambulatory Visit

## 2020-01-27 ENCOUNTER — Telehealth: Payer: Self-pay | Admitting: *Deleted

## 2020-01-27 ENCOUNTER — Encounter: Payer: Self-pay | Admitting: *Deleted

## 2020-01-27 NOTE — Telephone Encounter (Signed)
This RN spoke with pt's daughter Michelle [name] post her return call at 417 pm.  She stated " we as a family just have questions about the surgery and just need them answered before we put her thru all this "  Michelle [name] stated frustration relating to last week with appointment cancelled here as well as issues she experienced with the nurse in pre op.  This RN made appt for this Friday with Dr [name] per family request -   Michelle [name] stated concern that her mother has not restarted the Ibrance due to the pending surgery  With last dose they believe was week before Thanksgiving.  " should she restarted the Ibrance ?"  This RN informed Michelle [name] above inquiry will be given to MD and this RN will return call tomorrow- either before 9 or post 4 pm.

## 2020-01-27 NOTE — Telephone Encounter (Addendum)
This RN received a VM from the pt's daughter- [name]- stating " my mom was scheduled for surgery but I cancelled that "  " when we came in last week and had a lab and we had down we were also supposed to see the doctor - we have questions about the surgery ( see note entered by this RN per this episode ) "  " then when we spoke with the nurse at [facility] she was very rude "  " So I want my mom to get back on the Ibrance until we can get everything taken care of and then have surgery after the 1st of the year ".  Note appointment on 12/9 for lab was associated with an MD appointment that was cancelled per request by LCC/NP at the visit on 11/12. MD appointment cancelled but not the lab.  [name] stated she is presently at work and doesn't have VM but if office calls she will call back after she gets home around 4pm.  This RN returned call to given number of [phone] obtained message that the mailbox was full and could not accept further messages.  This note will be forwarded to Dr [name] and Dr Georgette Dover per need for appointment to discuss care and plan.

## 2020-01-28 ENCOUNTER — Encounter (HOSPITAL_COMMUNITY): Admission: RE | Payer: Self-pay | Source: Home / Self Care

## 2020-01-28 ENCOUNTER — Ambulatory Visit (HOSPITAL_COMMUNITY): Admission: RE | Admit: 2020-01-28 | Payer: Medicare HMO | Source: Home / Self Care | Admitting: Surgery

## 2020-01-28 SURGERY — BILATERAL TOTAL MASTECTOMY WITH AXILLARY LYMPH NODE DISSECTION
Anesthesia: General | Site: Breast | Laterality: Bilateral

## 2020-01-29 ENCOUNTER — Telehealth: Payer: Self-pay | Admitting: *Deleted

## 2020-01-29 NOTE — Telephone Encounter (Signed)
This RN spoke with the patient's daughter- [name] - per her call stating " my brother spoke with the surgeon and got our questions answered so mom is getting scheduled for the surgery the 1st of January "  [name] states they do not need to see Dr Jannifer Rodney this Friday.  [name] did ask about need to resume the Ibrance until surgery.  This RN discussed above - that due to surgery being scheduled for the 1st of January- Ibrance would not be needed. This RN did inform [name] - if surgery is delayed to call us again to discuss possible need to resume neoadjuvant Ibrance.  No further questions at this time.  Appt canceled by this RN.

## 2020-01-31 ENCOUNTER — Ambulatory Visit: Payer: Medicare HMO | Admitting: Oncology

## 2020-02-20 ENCOUNTER — Telehealth: Payer: Self-pay | Admitting: Oncology

## 2020-02-20 NOTE — Telephone Encounter (Signed)
R/s appt per 1/5 sch msg - pt daughter is aware of appt date and time

## 2020-02-24 ENCOUNTER — Other Ambulatory Visit: Payer: Medicare HMO

## 2020-02-24 ENCOUNTER — Other Ambulatory Visit: Payer: Self-pay | Admitting: Oncology

## 2020-02-24 ENCOUNTER — Ambulatory Visit: Payer: Medicare HMO | Admitting: Oncology

## 2020-02-24 DIAGNOSIS — Z170 Estrogen receptor positive status [ER+]: Secondary | ICD-10-CM

## 2020-02-24 DIAGNOSIS — C50412 Malignant neoplasm of upper-outer quadrant of left female breast: Secondary | ICD-10-CM

## 2020-02-25 ENCOUNTER — Other Ambulatory Visit: Payer: Self-pay | Admitting: Oncology

## 2020-02-25 MED FILL — IBRANCE 100 MG TABS: 100 | 28 days supply | Qty: 21 | Fill #0

## 2020-02-27 ENCOUNTER — Encounter: Payer: Self-pay | Admitting: *Deleted

## 2020-03-03 NOTE — Progress Notes (Signed)
[place]  [phone] [phone]     ID: Michelle [name] DOB: 1931-02-09  MR#: [id number]  KGU#:[id number]  Patient Care Team: [name], MD as PCP - General (Internal Medicine) Michelle [name], Virgie Dad, MD as Consulting Physician (Oncology) Donnie Mesa, MD as Consulting Physician (General Surgery) Mauro Kaufmann, RN as Oncology Nurse Navigator Rockwell Germany, RN as Oncology Nurse Navigator Aurea Graff OTHER MD:  CHIEF COMPLAINT: Bilateral estrogen receptor positive breast cancers  CURRENT TREATMENT: neoadjuvant anti-estrogens and palbociclib   INTERVAL HISTORY: Michelle [name] was scheduled today for follow up of her bilateral estrogen receptor positive breast cancers accompanied by her daughter Michelle [name] and her son.      She is taking Anastrozole daily with good tolerance.  She has no arthralgias, noticeable hot flashes.    She also continues on palbocicilib 3 weeks on and 1 week off with good tolerance.  She is at the 143m dose, and her ANC is normal today.  She is almost finished with her three weeks on, but she cannot recall how many she has left.  from [place]- the interval history was very confusing-- from the daughter cancelling the follow up appointment, then showing up wondering why they didn't have an appointment, the family having questions for the surgeon but not asking the [facility] himself, surgery supposedly being moved to 02/15/2020 but not actually happening, etc. I'm not sure what the plan for surgery is now or if they want to just continue with anastrozole/palbociclib?   REVIEW OF SYSTEMS: [facility]   COVID 174VACCINATION STATUS:      HISTORY OF CURRENT ILLNESS: From the original intake note:  Michelle SAVELLherself palpated a left breast mass around January 2021, as she recalls. She underwent bilateral diagnostic mammography with tomography and bilateral breast [facility] at [facility] showing:  LEFT BREAST: breast density category B; 4.9 cm spiculated left breast mass extending from 12-2 o'clock, as well as to and likely involving overlying skin/nipple (with associated retraction); at least 8 abnormal left axillary lymph nodes;   RIGHT BREAST: two suspicious right breast masses, 1.2 cm at 12 o'clock and 2.5 cm at 9:30, approximately 2.6 cm apart; negative right axilla.  Accordingly that same day, she proceeded to biopsy of the bilateral breast areas in question. The pathology from this procedure ([id number]) showed:  1. Left breast, 12-2 o'clock  - invasive and in situ mammary carcinoma, grade 2, e-cadherin positive   - estrogen receptor, 100% positive with strong staining intensity and progesterone receptor, 0% negative. Proliferation marker Ki67 at 12%. HER2 negative by immunohistochemistry (1+). 4. Lymph node, left axilla  - metastatic carcinoma with extra capsular extension (1/1)  2. Right breast, 9:30  - invasive ductal carcinoma, grade 1  - morphologically dissimilar from left breast carcinoma  - estrogen receptor, 100% positive with strong staining intensity and progesterone receptor, 0% negative. Proliferation marker Ki67 at 5%. HER2 negative by immunohistochemistry (1+). 3. Right breast, 12 o'clock  - invasive and in situ mammary carcinoma, grade 1-2   - vascular calcifications  - morphologically dissimilar from other right breast carcinoma  - estrogen receptor, 100% positive with strong staining intensity and progesterone receptor, 0% negative. Proliferation marker Ki67 at 5%. HER2 negative by immunohistochemistry (1+).   The patient's subsequent history is as detailed below.   PAST MEDICAL HISTORY: Past Medical History:  Diagnosis Date   Arthritis    Cancer ([place]    Bilaterial Breast Cancer   Dyspnea  GERD (gastroesophageal reflux disease)    Hypertension    Stroke ([place])    "mini stroke" per patient    PAST SURGICAL HISTORY: Past Surgical History:   Procedure Laterality Date   ABDOMINAL HYSTERECTOMY     APPENDECTOMY     SHOULDER SURGERY     Right  Status post total abdominal hysterectomy with bilateral salpingo-oophorectomy, status post appendectomy, status post tonsillectomy.   FAMILY HISTORY: Family History  Adopted: Yes  The patient is adopted and has no information on her parents siblings or other blood relatives   GYNECOLOGIC HISTORY:  No LMP recorded. Patient is postmenopausal. Menarche: Does not recall Age at first live birth: 87 years old [place] P 4 LMP 43s Hysterectomy?  Yes BSO?  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  Her children are Michelle [name], who lives in [place] and works for [name]) lives in [place] and runs his own business, and Michelle [name]") who lives in [place] and works for [employer]  He is also training his children to box.  Son Michelle [name] died from hepatitis C at age 32.  The patient has 11 grandchildren and more great grandchildren and great great-grandchildren than she can count.  She attends a local [place]: Not in place.  At the 05/28/2019 visit the patient was given the appropriate documents to complete and notarized at her discretion.   HEALTH MAINTENANCE: Social History   Tobacco Use   Smoking status: Never Smoker   Smokeless tobacco: Never Used  Scientific laboratory technician Use: Never used  Substance Use Topics   Alcohol use: No   Drug use: No    Frequency: 3.0 times per week     Colonoscopy: Never  PAP: Remote  Bone density: Never   No Known Allergies  Current Outpatient Medications  Medication Sig Dispense Refill   acetaminophen (TYLENOL) 325 MG tablet Take 325-650 mg by mouth See admin instructions. Take 650 mg by mouth in the morning and an additional 325 mg in the evening     amoxicillin (AMOXIL) 500 MG tablet Take 1  tablet (500 mg total) by mouth 2 (two) times daily. (Patient not taking: Reported on 01/23/2020) 10 tablet 0   anastrozole (ARIMIDEX) 1 MG tablet Take 1 tablet (1 mg total) by mouth daily. 90 tablet 4   Calcium Carb-Cholecalciferol (CALCIUM 600+D3) 600-800 MG-UNIT TABS Take 2 tablets by mouth in the morning.     diphenhydrAMINE (BENADRYL) 12.5 MG/5ML liquid Take 12.5-25 mg by mouth 4 (four) times daily as needed for allergies.     esomeprazole (NEXIUM) 20 MG capsule Take 20 mg by mouth daily as needed (acid reflux).     gabapentin (NEURONTIN) 100 MG capsule Take 100 mg by mouth 3 (three) times daily.     IBRANCE 100 MG tablet TAKE 1 TABLET (100 MG TOTAL) BY MOUTH DAILY. TAKE FOR 21 DAYS ON, 7 DAYS OFF, REPEAT EVERY 28 DAYS. 21 tablet 6   losartan-hydrochlorothiazide (HYZAAR) 100-12.5 MG per tablet Take 1 tablet by mouth in the morning.     omeprazole (PRILOSEC) 20 MG capsule Take 1 capsule (20 mg total) by mouth daily. (Patient not taking: Reported on 01/23/2020) 30 capsule 0   pravastatin (PRAVACHOL) 20 MG tablet Take 20 mg by mouth daily.     triamcinolone ointment (KENALOG) 0.5 % Apply 1 application  topically 2 (two) times daily. 30 g 0   No current facility-administered medications for this visit.    OBJECTIVE: African-American woman examined in a wheelchair  There were no vitals filed for this visit.   There is no height or weight on file to calculate BMI.   Wt Readings from Last 3 Encounters:  01/23/20 115 lb (52.2 kg)  12/27/19 115 lb 9.6 oz (52.4 kg)  11/01/19 122 lb (55.3 kg)  ECOG FS:2 - Symptomatic, <50% confined to bed  Sclerae unicteric, EOMs intact Wearing a mask No cervical or supraclavicular adenopathy Lungs no rales or rhonchi Heart regular rate and rhythm Abd soft, nontender, positive bowel sounds MSK no focal spinal tenderness, no upper extremity lymphedema Neuro: nonfocal, well oriented, appropriate affect Breasts:      Left beast  05/28/2019    Right breast, 05/28/2019    LAB RESULTS:  CMP     Component Value Date/Time   NA 144 01/23/2020 1338   K 3.8 01/23/2020 1338   CL 104 01/23/2020 1338   CO2 29 01/23/2020 1338   GLUCOSE 85 01/23/2020 1338   BUN 18 01/23/2020 1338   CREATININE 0.94 01/23/2020 1338   CALCIUM 10.2 01/23/2020 1338   PROT 7.6 01/23/2020 1338   ALBUMIN 4.4 01/23/2020 1338   AST 26 01/23/2020 1338   ALT 19 01/23/2020 1338   ALKPHOS 47 01/23/2020 1338   BILITOT 0.7 01/23/2020 1338   GFRNONAA 59 (L) 01/23/2020 1338   GFRAA 47 (L) 11/01/2019 1142    No results found for: TOTALPROTELP, ALBUMINELP, A1GS, A2GS, BETS, BETA2SER, GAMS, MSPIKE, SPEI  Lab Results  Component Value Date   WBC 6.2 01/23/2020   NEUTROABS 3.6 01/23/2020   HGB 10.2 (L) 01/23/2020   HCT 31.4 (L) 01/23/2020   MCV 101.9 (H) 01/23/2020   PLT 415 (H) 01/23/2020    No results found for: LABCA2  No components found for: JTTSVX793  No results for input(s): INR in the last 168 hours.  No results found for: LABCA2  No results found for: JQZ009  No results found for: QZR007  No results found for: MAU633  No results found for: CA2729  No components found for: HGQUANT  No results found for: CEA1 / No results found for: CEA1   No results found for: AFPTUMOR  No results found for: CHROMOGRNA  No results found for: KPAFRELGTCHN, LAMBDASER, KAPLAMBRATIO (kappa/lambda light chains)  No results found for: HGBA, HGBA2QUANT, HGBFQUANT, HGBSQUAN (Hemoglobinopathy evaluation)   No results found for: LDH  No results found for: IRON, TIBC, IRONPCTSAT (Iron and TIBC)  No results found for: FERRITIN  Urinalysis    Component Value Date/Time   COLORURINE YELLOW 11/01/2019 1300   APPEARANCEUR HAZY (A) 11/01/2019 1300   LABSPEC 1.018 11/01/2019 1300   PHURINE 5.0 11/01/2019 1300   GLUCOSEU NEGATIVE 11/01/2019 1300   HGBUR NEGATIVE 11/01/2019 1300   BILIRUBINUR NEGATIVE 11/01/2019 1300   KETONESUR  NEGATIVE 11/01/2019 1300   PROTEINUR NEGATIVE 11/01/2019 1300   UROBILINOGEN 0.2 07/05/2012 1610   NITRITE NEGATIVE 11/01/2019 1300   LEUKOCYTESUR MODERATE (A) 11/01/2019 1300    STUDIES: No results found.   ELIGIBLE FOR AVAILABLE RESEARCH PROTOCOL: AET  ASSESSMENT: 87 y.o. [place] woman status post bilateral breast biopsies on 05/17/2019 as follows:  (a) on the left upper outer quadrant, a clinical T2 N2, stage IIIA invasive ductal carcinoma,grade 2 or 3, estrogen receptor positive, progesterone receptor negative, with no HER-2 amplification and an MIB-1 of 12%  (b) on the right  overlapping sites, 2 morphologically distinct invasive ductal carcinomas, both clinically T1c N0, stage IA, grade 1 or 2, estrogen receptor positive, progesterone receptor negative, with no HER-2 amplification and an MIB-1 of 5%  (c) CT of the chest abdomen and pelvis 06/10/2019 shows left axillary, retropectoral and supraclavicular lymph nodes, and a 4.1 cm left breast mass; no right axillary adenopathy; 0.6 cm nonspecific left lower lobe pulmonary nodule; no liver or bone lesions.  (d) bone scan 06/26/2019--no evidence of skeletal metastases    (1) neoadjuvant anastrozole started 05/28/2019, palbociclib added 05/30/2019 125 mg a day, 21 on 7 off  (a) palbociclib dose decreased cycle 2 (May 2021) to 100 mg daily, 21/7  (b) bilateral breast and axilla ultrasonography 10/04/2019 shows a good initial response to treatment  (2) genetics testing declined  (3) definitive surgery pending  (4) adjuvant radiation   PLAN: Duchess did not show for her appointment today.  A follow-up letter has been sent   Sarajane Jews C. Magrinat, MD 03/03/20 9:33 PM Medical Oncology and Hematology [facility] [place], [place] [address] Tel. [phone]    Fax. [phone]   I, Wilburn Mylar, am acting as scribe for Dr. Virgie Dad. Michelle [name].  .   *Total Encounter Time as defined by the Centers  for Medicare and Medicaid Services includes, in addition to the face-to-face time of a patient visit (documented in the note above) non-face-to-face time: obtaining and reviewing outside history, ordering and reviewing medications, tests or procedures, care coordination (communications with other health care professionals or caregivers) and documentation in the medical record.

## 2020-03-04 ENCOUNTER — Ambulatory Visit (HOSPITAL_BASED_OUTPATIENT_CLINIC_OR_DEPARTMENT_OTHER): Payer: Medicare HMO | Admitting: Oncology

## 2020-03-04 ENCOUNTER — Other Ambulatory Visit: Payer: Medicare HMO

## 2020-03-04 DIAGNOSIS — Z79811 Long term (current) use of aromatase inhibitors: Secondary | ICD-10-CM

## 2020-03-04 DIAGNOSIS — C50412 Malignant neoplasm of upper-outer quadrant of left female breast: Secondary | ICD-10-CM

## 2020-03-04 DIAGNOSIS — Z170 Estrogen receptor positive status [ER+]: Secondary | ICD-10-CM

## 2020-03-04 DIAGNOSIS — C50811 Malignant neoplasm of overlapping sites of right female breast: Secondary | ICD-10-CM

## 2020-03-05 ENCOUNTER — Telehealth: Payer: Self-pay | Admitting: *Deleted

## 2020-03-05 ENCOUNTER — Encounter: Payer: Self-pay | Admitting: *Deleted

## 2020-03-05 NOTE — Telephone Encounter (Signed)
Received msg from sx scheduler with Dr. Georgette Dover that pt and family would like to hold on sx at this time and continue oral treatment. Scheduling msg sent for an appt for pt to further discuss with Dr. [name]

## 2020-03-06 ENCOUNTER — Other Ambulatory Visit: Payer: Self-pay | Admitting: *Deleted

## 2020-03-06 ENCOUNTER — Encounter: Payer: Self-pay | Admitting: Oncology

## 2020-03-06 MED ORDER — ANASTROZOLE 1 MG PO TABS
1.0000 mg | ORAL_TABLET | Freq: Every day | ORAL | 4 refills | Status: DC
Start: 2020-03-06 — End: 2020-06-26

## 2020-03-15 NOTE — Progress Notes (Incomplete)
[facility]  [phone] [phone]     ID: Michelle [name] DOB: 02/12/1931  MR#: [id number]  CSN#:[id number]  Patient Care Team: Paterson, Daniel, MD as PCP - General (Internal Medicine) Magrinat, Gustav C, MD as Consulting Physician (Oncology) Tsuei, Matthew, MD as Consulting Physician (General Surgery) Stuart, Dawn C, RN as Oncology Nurse Navigator Martini, Keisha N, RN as Oncology Nurse Navigator Kathryn L Daubenspeck OTHER MD:  CHIEF COMPLAINT: Bilateral estrogen receptor positive breast cancers  CURRENT TREATMENT: neoadjuvant anti-estrogens and palbociclib   INTERVAL HISTORY: Michelle [name] returns today for follow up of her bilateral estrogen receptor positive breast cancers accompanied by her daughter [name] and her son.***      She is taking Anastrozole daily with good tolerance.  She has no arthralgias, noticeable hot flashes.    She also continues on palbocicilib 3 weeks on and 1 week off with good tolerance.  She is at the 100mg dose.  Since her last visit, she met with Dr. Tsuei for follow up. Per telephone note with our nurse navigator Dawn on 03/05/2020, the patient and family would like to hold off on surgery at this time.   REVIEW OF SYSTEMS: [name]    COVID 19 VACCINATION STATUS:      HISTORY OF CURRENT ILLNESS: From the original intake note:  Michelle [name] herself palpated a left breast mass around January 2021, as she recalls. She underwent bilateral diagnostic mammography with tomography and bilateral breast ultrasonography at [facility] on 05/17/2019 showing: LEFT BREAST: breast density category B; 4.9 cm spiculated left breast mass extending from 12-2 o'clock, as well as to and likely involving overlying skin/nipple (with associated retraction); at least 8 abnormal left axillary lymph nodes;   RIGHT BREAST: two suspicious right breast masses, 1.2 cm at 12 o'clock and 2.5 cm at 9:30, approximately 2.6 cm apart;  negative right axilla.  Accordingly that same day, she proceeded to biopsy of the bilateral breast areas in question. The pathology from this procedure ([id number]) showed:  1. Left breast, 12-2 o'clock  - invasive and in situ mammary carcinoma, grade 2, e-cadherin positive   - estrogen receptor, 100% positive with strong staining intensity and progesterone receptor, 0% negative. Proliferation marker Ki67 at 12%. HER2 negative by immunohistochemistry (1+). 4. Lymph node, left axilla  - metastatic carcinoma with extra capsular extension (1/1)  2. Right breast, 9:30  - invasive ductal carcinoma, grade 1  - morphologically dissimilar from left breast carcinoma  - estrogen receptor, 100% positive with strong staining intensity and progesterone receptor, 0% negative. Proliferation marker Ki67 at 5%. HER2 negative by immunohistochemistry (1+). 3. Right breast, 12 o'clock  - invasive and in situ mammary carcinoma, grade 1-2   - vascular calcifications  - morphologically dissimilar from other right breast carcinoma  - estrogen receptor, 100% positive with strong staining intensity and progesterone receptor, 0% negative. Proliferation marker Ki67 at 5%. HER2 negative by immunohistochemistry (1+).  The patient's subsequent history is as detailed below.   PAST MEDICAL HISTORY: Past Medical History:  Diagnosis Date  . Arthritis   . Cancer (HCC)    Bilaterial Breast Cancer  . Dyspnea   . GERD (gastroesophageal reflux disease)   . Hypertension   . Stroke (HCC)    "mini stroke" per patient    PAST SURGICAL HISTORY: Past Surgical History:  Procedure Laterality Date  . ABDOMINAL HYSTERECTOMY    . APPENDECTOMY    . SHOULDER SURGERY     Right  Status post total   abdominal hysterectomy with bilateral salpingo-oophorectomy, status post appendectomy, status post tonsillectomy.   FAMILY HISTORY: Family History  Adopted: Yes  The patient is adopted and has no information on her parents  siblings or other blood relatives   GYNECOLOGIC HISTORY:  No LMP recorded. Patient is postmenopausal. Menarche: Does not recall Age at first live birth: 87 years old [place] P 4 LMP 50s Hysterectomy?  Yes BSO?  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  Her children are Michelle [name], who lives in [place] and works for [employer] Michelle [name]) lives in [place] and runs his own business, and Michelle [name]") who lives in [place] and works for [employer]  He is also training his children to box.  Son Michelle [name] died from hepatitis C at age 97.  The patient has 11 grandchildren and more great grandchildren and great great-grandchildren than she can count.  She attends a local [place]: Not in place.  At the 05/28/2019 visit the patient was given the appropriate documents to complete and notarized at her discretion.   HEALTH MAINTENANCE: Social History   Tobacco Use  . Smoking status: Never Smoker  . Smokeless tobacco: Never Used  Vaping Use  . Vaping Use: Never used  Substance Use Topics  . Alcohol use: No  . Drug use: No    Frequency: 3.0 times per week     Colonoscopy: Never  PAP: Remote  Bone density: Never   No Known Allergies  Current Outpatient Medications  Medication Sig Dispense Refill  . acetaminophen (TYLENOL) 325 MG tablet Take 325-650 mg by mouth See admin instructions. Take 650 mg by mouth in the morning and an additional 325 mg in the evening    . amoxicillin (AMOXIL) 500 MG tablet Take 1 tablet (500 mg total) by mouth 2 (two) times daily. (Patient not taking: Reported on 01/23/2020) 10 tablet 0  . anastrozole (ARIMIDEX) 1 MG tablet Take 1 tablet (1 mg total) by mouth daily. 90 tablet 4  . Calcium Carb-Cholecalciferol (CALCIUM 600+D3) 600-800 MG-UNIT TABS Take 2 tablets by mouth in the morning.    . diphenhydrAMINE (BENADRYL) 12.5  MG/5ML liquid Take 12.5-25 mg by mouth 4 (four) times daily as needed for allergies.    Marland Kitchen esomeprazole (NEXIUM) 20 MG capsule Take 20 mg by mouth daily as needed (acid reflux).    . gabapentin (NEURONTIN) 100 MG capsule Take 100 mg by mouth 3 (three) times daily.    Leslee Home 100 MG tablet TAKE 1 TABLET (100 MG TOTAL) BY MOUTH DAILY. TAKE FOR 21 DAYS ON, 7 DAYS OFF, REPEAT EVERY 28 DAYS. 21 tablet 6  . losartan-hydrochlorothiazide (HYZAAR) 100-12.5 MG per tablet Take 1 tablet by mouth in the morning.    Marland Kitchen omeprazole (PRILOSEC) 20 MG capsule Take 1 capsule (20 mg total) by mouth daily. (Patient not taking: Reported on 01/23/2020) 30 capsule 0  . pravastatin (PRAVACHOL) 20 MG tablet Take 20 mg by mouth daily.    Marland Kitchen triamcinolone ointment (KENALOG) 0.5 % Apply 1 application topically 2 (two) times daily. 30 g 0   No current facility-administered medications for this visit.    OBJECTIVE: African-American woman examined in a wheelchair  There were no vitals filed for this visit.   There is no height or weight on file to calculate BMI.   Wt Readings from Last 3 Encounters:  01/23/20 115 lb (52.2 kg)  12/27/19 115 lb 9.6 oz (52.4 kg)  11/01/19 122 lb (55.3 kg)  ECOG FS:2 - Symptomatic, <50% confined to bed  Sclerae unicteric, EOMs intact Wearing a mask No cervical or supraclavicular adenopathy Lungs no rales or rhonchi Heart regular rate and rhythm Abd soft, nontender, positive bowel sounds MSK no focal spinal tenderness, no upper extremity lymphedema Neuro: nonfocal, well oriented, appropriate affect Breasts:    Left beast 05/28/2019    Right breast, 05/28/2019    LAB RESULTS:  CMP     Component Value Date/Time   NA 144 01/23/2020 1338   K 3.8 01/23/2020 1338   CL 104 01/23/2020 1338   CO2 29 01/23/2020 1338   GLUCOSE 85 01/23/2020 1338   BUN 18 01/23/2020 1338   CREATININE 0.94 01/23/2020 1338   CALCIUM 10.2 01/23/2020 1338   PROT 7.6 01/23/2020 1338   ALBUMIN 4.4  01/23/2020 1338   AST 26 01/23/2020 1338   ALT 19 01/23/2020 1338   ALKPHOS 47 01/23/2020 1338   BILITOT 0.7 01/23/2020 1338   GFRNONAA 59 (L) 01/23/2020 1338   GFRAA 47 (L) 11/01/2019 1142    No results found for: TOTALPROTELP, ALBUMINELP, A1GS, A2GS, BETS, BETA2SER, GAMS, MSPIKE, SPEI  Lab Results  Component Value Date   WBC 6.2 01/23/2020   NEUTROABS 3.6 01/23/2020   HGB 10.2 (L) 01/23/2020   HCT 31.4 (L) 01/23/2020   MCV 101.9 (H) 01/23/2020   PLT 415 (H) 01/23/2020    No results found for: LABCA2  No components found for: LABCAN125  No results for input(s): INR in the last 168 hours.  No results found for: LABCA2  No results found for: CAN199  No results found for: CAN125  No results found for: CAN153  No results found for: CA2729  No components found for: HGQUANT  No results found for: CEA1 / No results found for: CEA1   No results found for: AFPTUMOR  No results found for: CHROMOGRNA  No results found for: KPAFRELGTCHN, LAMBDASER, KAPLAMBRATIO (kappa/lambda light chains)  No results found for: HGBA, HGBA2QUANT, HGBFQUANT, HGBSQUAN (Hemoglobinopathy evaluation)   No results found for: LDH  No results found for: IRON, TIBC, IRONPCTSAT (Iron and TIBC)  No results found for: FERRITIN  Urinalysis    Component Value Date/Time   COLORURINE YELLOW 11/01/2019 1300   APPEARANCEUR HAZY (A) 11/01/2019 1300   LABSPEC 1.018 11/01/2019 1300   PHURINE 5.0 11/01/2019 1300   GLUCOSEU NEGATIVE 11/01/2019 1300   HGBUR NEGATIVE 11/01/2019 1300   BILIRUBINUR NEGATIVE 11/01/2019 1300   KETONESUR NEGATIVE 11/01/2019 1300   PROTEINUR NEGATIVE 11/01/2019 1300   UROBILINOGEN 0.2 07/05/2012 1610   NITRITE NEGATIVE 11/01/2019 1300   LEUKOCYTESUR MODERATE (A) 11/01/2019 1300    STUDIES: No results found.   ELIGIBLE FOR AVAILABLE RESEARCH PROTOCOL: AET  ASSESSMENT: 87 y.o. [place] woman status post bilateral breast biopsies on 05/17/2019 as follows:  (a)  on the left upper outer quadrant, a clinical T2 N2, stage IIIA invasive ductal carcinoma,grade 2 or 3, estrogen receptor positive, progesterone receptor negative, with no HER-2 amplification and an MIB-1 of 12%  (b) on the right overlapping sites, 2 morphologically distinct invasive ductal carcinomas, both clinically T1c N0, stage IA, grade 1 or 2, estrogen receptor positive, progesterone receptor negative, with no HER-2 amplification and an MIB-1 of 5%  (c) CT of the chest abdomen and pelvis 06/10/2019 shows left axillary, retropectoral and supraclavicular lymph nodes, and a 4.1 cm left breast mass; no right   axillary adenopathy; 0.6 cm nonspecific left lower lobe pulmonary nodule; no liver or bone lesions.  (d) bone scan 06/26/2019--no evidence of skeletal metastases    (1) neoadjuvant anastrozole started 05/28/2019, palbociclib added 05/30/2019 125 mg a day, 21 on 7 off  (a) palbociclib dose decreased cycle 2 (May 2021) to 100 mg daily, 21/7  (b) bilateral breast and axilla ultrasonography 10/04/2019 shows a good initial response to treatment  (2) genetics testing declined  (3) definitive surgery pending  (4) adjuvant radiation   PLAN: Kaytlynne did not show for her appointment today.  A follow-up letter has been sent   Sarajane Jews C. Magrinat, MD 03/15/20 12:40 PM Medical Oncology and Hematology [facility] [place], [place] [address] Tel. [phone]    Fax. [phone]   I, Wilburn Mylar, am acting as scribe for Dr. Virgie Dad. Magrinat.  {Add scribe attestation statement}   *Total Encounter Time as defined by the Centers for Medicare and Medicaid Services includes, in addition to the face-to-face time of a patient visit (documented in the note above) non-face-to-face time: obtaining and reviewing outside history, ordering and reviewing medications, tests or procedures, care coordination (communications with other health care professionals or caregivers)  and documentation in the medical record.

## 2020-03-16 ENCOUNTER — Ambulatory Visit: Payer: Medicare HMO | Admitting: Oncology

## 2020-03-17 ENCOUNTER — Encounter: Payer: Self-pay | Admitting: *Deleted

## 2020-03-19 MED FILL — IBRANCE 100 MG TABS: 100 | 28 days supply | Qty: 21 | Fill #1

## 2020-03-26 ENCOUNTER — Encounter: Payer: Self-pay | Admitting: *Deleted

## 2020-04-01 NOTE — Progress Notes (Signed)
Michelle [name]  [phone] [phone]     ID: Michelle [name] DOB: [birth year]  MR#: [id number]  OAC#:[id number]  Patient Care Team: [name], MD as PCP - General (Internal Medicine) Magrinat, Virgie Dad, MD as Consulting Physician (Oncology) Donnie Mesa, MD as Consulting Physician (General Surgery) Mauro Kaufmann, RN as Oncology Nurse Navigator Rockwell Germany, RN as Oncology Nurse Navigator Scot Dock, NP OTHER MD:  CHIEF COMPLAINT: Bilateral estrogen receptor positive breast cancers  CURRENT TREATMENT: neoadjuvant anti-estrogens and palbociclib   INTERVAL HISTORY: Michelle [name] was scheduled today for follow up of her bilateral estrogen receptor positive breast cancer.  She is accompanied by both her daughter and son today.    [name] continues on Palbociclib 174m 3 weeks on and 1 week off.  She tolerates this well and has another week of taking this treatment until her off week.    [name] continues on Anastrozole every day and tolerates it well.     REVIEW OF SYSTEMS: BMaceeis feeling well.  She was due to have surgery last month.  [name]'s daughter noted that when she brought her mom into admitting for surgery evaluation, the attendant there seemed very stressed out and gave her daughter a very bad vibe about her mom having surgery.  That experience she notes led to her mom cancelling the surgery.  After thought, discussion with Dr. TGeorgette Dover and consideration, [name] that she would like to proceed with surgery.  Her daughter notes that she is concerned about the surgery risks and whether or not they outweigh the risks of foregoing the surgery.    BTacarranotes that otherwise she is doing well and a detailed ROS was otherwise non contributory.     COVID 19 VACCINATION STATUS:      HISTORY OF CURRENT ILLNESS: From the original intake note:  BGEORGEANNA RADZIEWICZherself palpated a left breast mass around January 2021, as she recalls. She  underwent bilateral diagnostic mammography with tomography and bilateral breast [facility] showing: LEFT BREAST: breast density category B; 4.9 cm spiculated left breast mass extending from 12-2 o'clock, as well as to and likely involving overlying skin/nipple (with associated retraction); at least 8 abnormal left axillary lymph nodes;   RIGHT BREAST: two suspicious right breast masses, 1.2 cm at 12 o'clock and 2.5 cm at 9:30, approximately 2.6 cm apart; negative right axilla.  Accordingly that same day, she proceeded to biopsy of the bilateral breast areas in question. The pathology from this procedure ([id number]) showed:  1. Left breast, 12-2 o'clock  - invasive and in situ mammary carcinoma, grade 2, e-cadherin positive   - estrogen receptor, 100% positive with strong staining intensity and progesterone receptor, 0% negative. Proliferation marker Ki67 at 12%. HER2 negative by immunohistochemistry (1+). 4. Lymph node, left axilla  - metastatic carcinoma with extra capsular extension (1/1)  2. Right breast, 9:30  - invasive ductal carcinoma, grade 1  - morphologically dissimilar from left breast carcinoma  - estrogen receptor, 100% positive with strong staining intensity and progesterone receptor, 0% negative. Proliferation marker Ki67 at 5%. HER2 negative by immunohistochemistry (1+). 3. Right breast, 12 o'clock  - invasive and in situ mammary carcinoma, grade 1-2   - vascular calcifications  - morphologically dissimilar from other right breast carcinoma  - estrogen receptor, 100% positive with strong staining intensity and progesterone receptor, 0% negative. Proliferation marker Ki67 at 5%. HER2 negative by immunohistochemistry (1+).   The patient's subsequent  history is as detailed below.   PAST MEDICAL HISTORY: Past Medical History:  Diagnosis Date  . Arthritis   . Cancer (HCC)    Bilaterial Breast Cancer  . Dyspnea   . GERD  (gastroesophageal reflux disease)   . Hypertension   . Stroke [facility])    "mini stroke" per patient    PAST SURGICAL HISTORY: Past Surgical History:  Procedure Laterality Date  . ABDOMINAL HYSTERECTOMY    . APPENDECTOMY    . SHOULDER SURGERY     Right  Status post total abdominal hysterectomy with bilateral salpingo-oophorectomy, status post appendectomy, status post tonsillectomy.   FAMILY HISTORY: Family History  Adopted: Yes  The patient is adopted and has no information on her parents siblings or other blood relatives   GYNECOLOGIC HISTORY:  No LMP recorded. Patient is postmenopausal. Menarche: Does not recall Age at first live birth: 87 years old [name] 4 LMP 77s Hysterectomy?  Yes BSO?  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  Her children are [name], who lives in [place] and works for [name]) lives in [place] and runs his own business, and [name]") who lives in [place] and works for [employer]  He is also training his children to box.  Son [name] died from hepatitis C at age 55.  The patient has 11 grandchildren and more great grandchildren and great great-grandchildren than she can count.  She attends a local [place]: Not in place.  At the 05/28/2019 visit the patient was given the appropriate documents to complete and notarized at her discretion.   HEALTH MAINTENANCE: Social History   Tobacco Use  . Smoking status: Never Smoker  . Smokeless tobacco: Never Used  Vaping Use  . Vaping Use: Never used  Substance Use Topics  . Alcohol use: No  . Drug use: No    Frequency: 3.0 times per week     Colonoscopy: Never  PAP: Remote  Bone density: Never   No Known Allergies  Current Outpatient Medications  Medication Sig Dispense Refill  . acetaminophen (TYLENOL) 325 MG tablet Take 325-650 mg by  mouth See admin instructions. Take 650 mg by mouth in the morning and an additional 325 mg in the evening    . anastrozole (ARIMIDEX) 1 MG tablet Take 1 tablet (1 mg total) by mouth daily. 90 tablet 4  . Calcium Carb-Cholecalciferol (CALCIUM 600+D3) 600-800 MG-UNIT TABS Take 2 tablets by mouth in the morning.    . diphenhydrAMINE (BENADRYL) 12.5 MG/5ML liquid Take 12.5-25 mg by mouth 4 (four) times daily as needed for allergies.    Marland Kitchen esomeprazole (NEXIUM) 20 MG capsule Take 20 mg by mouth daily as needed (acid reflux).    . gabapentin (NEURONTIN) 100 MG capsule Take 100 mg by mouth 3 (three) times daily.    Leslee Home 100 MG tablet TAKE 1 TABLET (100 MG TOTAL) BY MOUTH DAILY. TAKE FOR 21 DAYS ON, 7 DAYS OFF, REPEAT EVERY 28 DAYS. 21 tablet 6  . losartan-hydrochlorothiazide (HYZAAR) 100-12.5 MG per tablet Take 1 tablet by mouth in the morning.    . pravastatin (PRAVACHOL) 20 MG tablet Take 20 mg by mouth daily.    Marland Kitchen triamcinolone ointment (KENALOG) 0.5 % Apply 1 application topically 2 (two) times daily. 30 g 0   No current facility-administered medications for this visit.  OBJECTIVE: African-American woman examined in a wheelchair  Vitals:   04/02/20 1525  BP: (!) 146/69  Pulse: 75  Resp: 18  Temp: 99.7 F (37.6 C)  SpO2: 99%     Body mass index is 21.89 kg/m.   Wt Readings from Last 3 Encounters:  04/02/20 112 lb 1.6 oz (50.8 kg)  01/23/20 115 lb (52.2 kg)  12/27/19 115 lb 9.6 oz (52.4 kg)  ECOG FS:2 - Symptomatic, <50% confined to bed GENERAL: Patient is a well appearing female in no acute distress HEENT:  Sclerae anicteric.  Oropharynx clear and moist. No ulcerations or evidence of oropharyngeal candidiasis. Neck is supple.  NODES:  No cervical, supraclavicular, or axillary lymphadenopathy palpated.  BREAST EXAM:  Difficult to palpate right breast mass, left breast mass/chest wall/axilla nodule that is about 2cm in size LUNGS:  Clear to auscultation bilaterally.  No wheezes or  rhonchi. HEART:  Regular rate and rhythm. No murmur appreciated. ABDOMEN:  Soft, nontender.  Positive, normoactive bowel sounds. No organomegaly palpated. MSK:  No focal spinal tenderness to palpation. Full range of motion bilaterally in the upper extremities. EXTREMITIES:  No peripheral edema.   SKIN:  Clear with no obvious rashes or skin changes. No nail dyscrasia. NEURO:  Nonfocal. Well oriented.  Appropriate affect.     LAB RESULTS:  CMP     Component Value Date/Time   NA 142 04/02/2020 1608   K 3.6 04/02/2020 1608   CL 104 04/02/2020 1608   CO2 27 04/02/2020 1608   GLUCOSE 91 04/02/2020 1608   BUN 24 (H) 04/02/2020 1608   CREATININE 1.15 (H) 04/02/2020 1608   CALCIUM 9.6 04/02/2020 1608   PROT 7.3 04/02/2020 1608   ALBUMIN 4.4 04/02/2020 1608   AST 19 04/02/2020 1608   ALT 10 04/02/2020 1608   ALKPHOS 45 04/02/2020 1608   BILITOT 1.1 04/02/2020 1608   GFRNONAA 46 (L) 04/02/2020 1608   GFRAA 47 (L) 11/01/2019 1142    No results found for: TOTALPROTELP, ALBUMINELP, A1GS, A2GS, BETS, BETA2SER, GAMS, MSPIKE, SPEI  Lab Results  Component Value Date   WBC 3.3 (L) 04/02/2020   NEUTROABS 1.6 (L) 04/02/2020   HGB 10.1 (L) 04/02/2020   HCT 31.5 (L) 04/02/2020   MCV 96.3 04/02/2020   PLT 324 04/02/2020    No results found for: LABCA2  No components found for: KGYJEH631  No results for input(s): INR in the last 168 hours.  No results found for: LABCA2  No results found for: SHF026  No results found for: VZC588  No results found for: FOY774  No results found for: CA2729  No components found for: HGQUANT  No results found for: CEA1 / No results found for: CEA1   No results found for: AFPTUMOR  No results found for: CHROMOGRNA  No results found for: KPAFRELGTCHN, LAMBDASER, KAPLAMBRATIO (kappa/lambda light chains)  No results found for: HGBA, HGBA2QUANT, HGBFQUANT, HGBSQUAN (Hemoglobinopathy evaluation)   No results found for: LDH  No results found  for: IRON, TIBC, IRONPCTSAT (Iron and TIBC)  No results found for: FERRITIN  Urinalysis    Component Value Date/Time   COLORURINE YELLOW 11/01/2019 1300   APPEARANCEUR HAZY (A) 11/01/2019 1300   LABSPEC 1.018 11/01/2019 1300   PHURINE 5.0 11/01/2019 1300   GLUCOSEU NEGATIVE 11/01/2019 1300   HGBUR NEGATIVE 11/01/2019 1300   BILIRUBINUR NEGATIVE 11/01/2019 1300   KETONESUR NEGATIVE 11/01/2019 1300   PROTEINUR NEGATIVE 11/01/2019 1300   UROBILINOGEN 0.2 07/05/2012 1610   NITRITE NEGATIVE 11/01/2019 1300  LEUKOCYTESUR MODERATE (A) 11/01/2019 1300    STUDIES: No results found.   ELIGIBLE FOR AVAILABLE RESEARCH PROTOCOL: AET  ASSESSMENT: 87 y.o. [place] woman status post bilateral breast biopsies on 05/17/2019 as follows:  (a) on the left upper outer quadrant, a clinical T2 N2, stage IIIA invasive ductal carcinoma,grade 2 or 3, estrogen receptor positive, progesterone receptor negative, with no HER-2 amplification and an MIB-1 of 12%  (b) on the right overlapping sites, 2 morphologically distinct invasive ductal carcinomas, both clinically T1c N0, stage IA, grade 1 or 2, estrogen receptor positive, progesterone receptor negative, with no HER-2 amplification and an MIB-1 of 5%  (c) CT of the chest abdomen and pelvis 06/10/2019 shows left axillary, retropectoral and supraclavicular lymph nodes, and a 4.1 cm left breast mass; no right axillary adenopathy; 0.6 cm nonspecific left lower lobe pulmonary nodule; no liver or bone lesions.  (d) bone scan 06/26/2019--no evidence of skeletal metastases    (1) neoadjuvant anastrozole started 05/28/2019, palbociclib added 05/30/2019 125 mg a day, 21 on 7 off  (a) palbociclib dose decreased cycle 2 (May 2021) to 100 mg daily, 21/7  (b) bilateral breast and axilla ultrasonography 10/04/2019 shows a good initial response to treatment  (2) genetics testing declined  (3) definitive surgery pending  (4) adjuvant radiation   PLAN: Ezmeralda ended  up cancelling her surgery in January.  I gave Nyemah and her children reassurance that at that point, the pandemic was still very present in the community and that is likely some of what they experienced the effects of during her visit to the hospital.  I apologized for her experience, and she noted that she understood that it was very prevalent during that time period.    We discussed risks of not having the surgery versus risks of having the surgery.  We reviewed her daughters concerns about Nadina getting weaker, and we also reviewed her son's concerns about the cancer getting worse and spreading.  They both have valid concerns, and ultimately the decision is Samariah's.  I asked Verina directly after considering the risks of each what she wants to do, and she said that she wants to proceed with having surgery.  In the meantime, we will get some labs on her while she is here, and I will order bilateral ultrasounds to see how her breast cancer is doing.    I have sent Dr. Georgette Dover a message about her desire to reschedule, and the family would like a March date so hopefully the weather will be a little better than it is now.  We will tentatively see Parlee back in 6 weeks.  She knows to call for any questions that may arise between now and her next appointment.  We are happy to see her sooner if needed.  Total encounter time: 30 minutes*   Wilber Bihari, NP 04/02/20 4:50 PM [facility] [place], [place] [address] Tel. [phone]    Fax. [phone]   *Total Encounter Time as defined by the Centers for Medicare and Medicaid Services includes, in addition to the face-to-face time of a patient visit (documented in the note above) non-face-to-face time: obtaining and reviewing outside history, ordering and reviewing medications, tests or procedures, care coordination (communications with other health care professionals or caregivers) and  documentation in the medical record.

## 2020-04-02 ENCOUNTER — Encounter: Payer: Self-pay | Admitting: Adult Health

## 2020-04-02 ENCOUNTER — Inpatient Hospital Stay: Payer: Medicare HMO | Attending: Adult Health | Admitting: Adult Health

## 2020-04-02 ENCOUNTER — Other Ambulatory Visit: Payer: Self-pay

## 2020-04-02 ENCOUNTER — Inpatient Hospital Stay: Payer: Medicare HMO

## 2020-04-02 VITALS — BP 146/69 | HR 75 | Temp 99.7°F | Resp 18 | Ht 60.0 in | Wt 112.1 lb

## 2020-04-02 DIAGNOSIS — Z79811 Long term (current) use of aromatase inhibitors: Secondary | ICD-10-CM | POA: Diagnosis not present

## 2020-04-02 DIAGNOSIS — I10 Essential (primary) hypertension: Secondary | ICD-10-CM | POA: Insufficient documentation

## 2020-04-02 DIAGNOSIS — Z9079 Acquired absence of other genital organ(s): Secondary | ICD-10-CM | POA: Diagnosis not present

## 2020-04-02 DIAGNOSIS — C50412 Malignant neoplasm of upper-outer quadrant of left female breast: Secondary | ICD-10-CM

## 2020-04-02 DIAGNOSIS — Z90722 Acquired absence of ovaries, bilateral: Secondary | ICD-10-CM | POA: Insufficient documentation

## 2020-04-02 DIAGNOSIS — Z8673 Personal history of transient ischemic attack (TIA), and cerebral infarction without residual deficits: Secondary | ICD-10-CM | POA: Insufficient documentation

## 2020-04-02 DIAGNOSIS — Z79899 Other long term (current) drug therapy: Secondary | ICD-10-CM | POA: Diagnosis not present

## 2020-04-02 DIAGNOSIS — Z170 Estrogen receptor positive status [ER+]: Secondary | ICD-10-CM | POA: Diagnosis not present

## 2020-04-02 DIAGNOSIS — Z90710 Acquired absence of both cervix and uterus: Secondary | ICD-10-CM | POA: Diagnosis not present

## 2020-04-02 DIAGNOSIS — C50811 Malignant neoplasm of overlapping sites of right female breast: Secondary | ICD-10-CM | POA: Insufficient documentation

## 2020-04-02 LAB — CBC WITH DIFFERENTIAL/PLATELET
Abs Immature Granulocytes: 0.01 10*3/uL (ref 0.00–0.07)
Basophils Absolute: 0.1 10*3/uL (ref 0.0–0.1)
Basophils Relative: 3 %
Eosinophils Absolute: 0.1 10*3/uL (ref 0.0–0.5)
Eosinophils Relative: 2 %
HCT: 31.5 % — ABNORMAL LOW (ref 36.0–46.0)
Hemoglobin: 10.1 g/dL — ABNORMAL LOW (ref 12.0–15.0)
Immature Granulocytes: 0 %
Lymphocytes Relative: 39 %
Lymphs Abs: 1.3 10*3/uL (ref 0.7–4.0)
MCH: 30.9 pg (ref 26.0–34.0)
MCHC: 32.1 g/dL (ref 30.0–36.0)
MCV: 96.3 fL (ref 80.0–100.0)
Monocytes Absolute: 0.3 10*3/uL (ref 0.1–1.0)
Monocytes Relative: 8 %
Neutro Abs: 1.6 10*3/uL — ABNORMAL LOW (ref 1.7–7.7)
Neutrophils Relative %: 48 %
Platelets: 324 10*3/uL (ref 150–400)
RBC: 3.27 MIL/uL — ABNORMAL LOW (ref 3.87–5.11)
RDW: 14.1 % (ref 11.5–15.5)
WBC: 3.3 10*3/uL — ABNORMAL LOW (ref 4.0–10.5)
nRBC: 0 % (ref 0.0–0.2)

## 2020-04-02 LAB — CMP (CANCER CENTER ONLY)
ALT: 10 U/L (ref 0–44)
AST: 19 U/L (ref 15–41)
Albumin: 4.4 g/dL (ref 3.5–5.0)
Alkaline Phosphatase: 45 U/L (ref 38–126)
Anion gap: 11 (ref 5–15)
BUN: 24 mg/dL — ABNORMAL HIGH (ref 8–23)
CO2: 27 mmol/L (ref 22–32)
Calcium: 9.6 mg/dL (ref 8.9–10.3)
Chloride: 104 mmol/L (ref 98–111)
Creatinine: 1.15 mg/dL — ABNORMAL HIGH (ref 0.44–1.00)
GFR, Estimated: 46 mL/min — ABNORMAL LOW (ref >60)
Glucose, Bld: 91 mg/dL (ref 70–99)
Potassium: 3.6 mmol/L (ref 3.5–5.1)
Sodium: 142 mmol/L (ref 135–145)
Total Bilirubin: 1.1 mg/dL (ref 0.3–1.2)
Total Protein: 7.3 g/dL (ref 6.5–8.1)

## 2020-04-03 ENCOUNTER — Ambulatory Visit: Payer: Self-pay | Admitting: Surgery

## 2020-04-03 ENCOUNTER — Encounter: Payer: Self-pay | Admitting: *Deleted

## 2020-04-06 ENCOUNTER — Telehealth: Payer: Self-pay

## 2020-04-06 NOTE — Telephone Encounter (Signed)
Contacted pt's son to let him know the requested paperwork had been signed by Dr. [name] and was ready for pick up.

## 2020-04-08 ENCOUNTER — Encounter: Payer: Self-pay | Admitting: *Deleted

## 2020-04-13 ENCOUNTER — Encounter: Payer: Self-pay | Admitting: *Deleted

## 2020-04-15 MED FILL — IBRANCE 100 MG TABS: 100 | 28 days supply | Qty: 21 | Fill #2

## 2020-04-24 ENCOUNTER — Other Ambulatory Visit: Payer: Medicare HMO

## 2020-04-27 ENCOUNTER — Encounter: Payer: Self-pay | Admitting: *Deleted

## 2020-05-11 ENCOUNTER — Telehealth: Payer: Self-pay | Admitting: Oncology

## 2020-05-11 NOTE — Telephone Encounter (Signed)
Scheduled appt per 3/28 sch msg. Pt's daughter is aware. She will call back tomorrow to confirm appt.

## 2020-05-12 ENCOUNTER — Other Ambulatory Visit (HOSPITAL_COMMUNITY): Payer: Self-pay

## 2020-05-13 ENCOUNTER — Other Ambulatory Visit: Payer: Medicare HMO

## 2020-05-13 ENCOUNTER — Ambulatory Visit: Payer: Medicare HMO | Admitting: Oncology

## 2020-05-13 MED FILL — IBRANCE 100 MG TABS: 100 | 28 days supply | Qty: 21 | Fill #3

## 2020-05-15 ENCOUNTER — Telehealth: Payer: Self-pay | Admitting: Oncology

## 2020-05-15 NOTE — Telephone Encounter (Signed)
R/s appt per 3/31 sch msg. Pt's daughter is aware.

## 2020-05-20 ENCOUNTER — Other Ambulatory Visit: Payer: Self-pay

## 2020-05-20 ENCOUNTER — Ambulatory Visit
Admission: RE | Admit: 2020-05-20 | Discharge: 2020-05-20 | Disposition: A | Discharge status: Home / Self Care | Payer: Medicare HMO | Source: Ambulatory Visit | Attending: Adult Health | Admitting: Adult Health

## 2020-05-20 DIAGNOSIS — Z170 Estrogen receptor positive status [ER+]: Secondary | ICD-10-CM

## 2020-05-20 DIAGNOSIS — C50412 Malignant neoplasm of upper-outer quadrant of left female breast: Secondary | ICD-10-CM

## 2020-05-20 DIAGNOSIS — C50911 Malignant neoplasm of unspecified site of right female breast: Secondary | ICD-10-CM | POA: Diagnosis not present

## 2020-05-20 DIAGNOSIS — C50912 Malignant neoplasm of unspecified site of left female breast: Secondary | ICD-10-CM | POA: Diagnosis not present

## 2020-05-20 DIAGNOSIS — C50811 Malignant neoplasm of overlapping sites of right female breast: Secondary | ICD-10-CM

## 2020-05-21 ENCOUNTER — Encounter: Payer: Self-pay | Admitting: *Deleted

## 2020-05-26 NOTE — Progress Notes (Signed)
[place]  [phone] [phone]     ID: Michelle [name] DOB: 03-Apr-1930  MR#: [id number]  XTA#:[id number]  Patient Care Team: Michelle Battles, MD as PCP - General (Internal Medicine) Michelle [name], Michelle Dad, MD as Consulting Physician (Oncology) Michelle Mesa, MD as Consulting Physician (General Surgery) Michelle Kaufmann, RN as Oncology Nurse Navigator Michelle Germany, RN as Oncology Nurse Navigator Michelle Cruel, MD OTHER MD:  CHIEF COMPLAINT: Bilateral estrogen receptor positive breast cancers  CURRENT TREATMENT: neoadjuvant anti-estrogens and palbociclib   INTERVAL HISTORY: Michelle [name] returns today for follow up of her bilateral estrogen receptor positive breast cancer.  She is accompanied by her daughter and son  Since her last visit, she underwent bilateral breast ultrasound on 05/20/2020 showing: interval improvement in patient's bilateral breast malignancies and abnormal right axillary lymph nodes (left mass is 2.5 cm, previously 3.4; right masses are 9 mm, previously 11 mm, and 2.1 cm, previously 2.4 cm).  Michelle [name] continues on Palbociclib 156m 3 weeks on and 1 week off.  She tolerates this well.  Michelle [name] continues on Anastrozole every day and tolerates it well.     REVIEW OF SYSTEMS: Michelle [name] feels comfortable.  She denies any significant side effects from the treatment.  She "putters around the house" during the day, not using a cane or walker, and also not falling.  A detailed review of systems today was otherwise stable.   COVID 19 VACCINATION STATUS:      HISTORY OF CURRENT ILLNESS: From the original intake note:  BSHAKIAH WESTERherself palpated a left breast mass around January 2021, as she recalls. She underwent bilateral diagnostic mammography with tomography [facility] showing: LEFT BREAST: breast density category B; 4.9 cm spiculated left breast mass extending  from 12-2 o'clock, as well as to and likely involving overlying skin/nipple (with associated retraction); at least 8 abnormal left axillary lymph nodes;   RIGHT BREAST: two suspicious right breast masses, 1.2 cm at 12 o'clock and 2.5 cm at 9:30, approximately 2.6 cm apart; negative right axilla.  Accordingly that same day, she proceeded to biopsy of the bilateral breast areas in question. The pathology from this procedure ([id number]) showed:  1. Left breast, 12-2 o'clock  - invasive and in situ mammary carcinoma, grade 2, e-cadherin positive   - estrogen receptor, 100% positive with strong staining intensity and progesterone receptor, 0% negative. Proliferation marker Ki67 at 12%. HER2 negative by immunohistochemistry (1+). 4. Lymph node, left axilla  - metastatic carcinoma with extra capsular extension (1/1)  2. Right breast, 9:30  - invasive ductal carcinoma, grade 1  - morphologically dissimilar from left breast carcinoma  - estrogen receptor, 100% positive with strong staining intensity and progesterone receptor, 0% negative. Proliferation marker Ki67 at 5%. HER2 negative by immunohistochemistry (1+). 3. Right breast, 12 o'clock  - invasive and in situ mammary carcinoma, grade 1-2   - vascular calcifications  - morphologically dissimilar from other right breast carcinoma  - estrogen receptor, 100% positive with strong staining intensity and progesterone receptor, 0% negative. Proliferation marker Ki67 at 5%. HER2 negative by immunohistochemistry (1+).   The patient's subsequent history is as detailed below.   PAST MEDICAL HISTORY: Past Medical History:  Diagnosis Date  . Arthritis   . Cancer (HCC)    Bilaterial Breast Cancer  . Dyspnea   . GERD (gastroesophageal reflux disease)   . Hypertension   . Stroke ([facility]    "mini  stroke" per patient    PAST SURGICAL HISTORY: Past Surgical History:  Procedure Laterality Date  . ABDOMINAL HYSTERECTOMY    . APPENDECTOMY    .  SHOULDER SURGERY     Right  Status post total abdominal hysterectomy with bilateral salpingo-oophorectomy, status post appendectomy, status post tonsillectomy.   FAMILY HISTORY: Family History  Adopted: Yes  The patient is adopted and has no information on her parents siblings or other blood relatives   GYNECOLOGIC HISTORY:  No LMP recorded. Patient is postmenopausal. Menarche: Does not recall Age at first live birth: 87 years old [place] P 4 LMP 63s Hysterectomy?  Yes BSO?  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  Her children are Michelle [name], who lives in [place] and works for [employer] Michelle [name]) lives in [place] and runs his own business, and Michelle [name]") who lives in [place] and works for [employer]  He is also training his children to box.  Son Michelle [name] died from hepatitis C at age 57.  The patient has 11 grandchildren and more great grandchildren and great great-grandchildren than she can count.  She attends a local [place]: Not in place.  At the 05/28/2019 visit the patient was given the appropriate documents to complete and notarized at her discretion.   HEALTH MAINTENANCE: Social History   Tobacco Use  . Smoking status: Never Smoker  . Smokeless tobacco: Never Used  Vaping Use  . Vaping Use: Never used  Substance Use Topics  . Alcohol use: No  . Drug use: No    Frequency: 3.0 times per week     Colonoscopy: Never  PAP: Remote  Bone density: Never   No Active Allergies  Current Outpatient Medications  Medication Sig Dispense Refill  . acetaminophen (TYLENOL) 325 MG tablet Take 325-650 mg by mouth See admin instructions. Take 650 mg by mouth in the morning and an additional 325 mg in the evening    . anastrozole (ARIMIDEX) 1 MG tablet Take 1 tablet (1 mg total) by mouth daily. 90 tablet 4  . Calcium  Carb-Cholecalciferol (CALCIUM 600+D3) 600-800 MG-UNIT TABS Take 2 tablets by mouth in the morning.    . diphenhydrAMINE (BENADRYL) 12.5 MG/5ML liquid Take 12.5-25 mg by mouth 4 (four) times daily as needed for allergies.    Marland Kitchen esomeprazole (NEXIUM) 20 MG capsule Take 20 mg by mouth daily as needed (acid reflux).    . gabapentin (NEURONTIN) 100 MG capsule Take 100 mg by mouth 3 (three) times daily.    Marland Kitchen losartan-hydrochlorothiazide (HYZAAR) 100-12.5 MG per tablet Take 1 tablet by mouth in the morning.    . palbociclib (IBRANCE) 100 MG tablet TAKE 1 TABLET (100 MG TOTAL) BY MOUTH DAILY. TAKE FOR 21 DAYS ON, 7 DAYS OFF, REPEAT EVERY 28 DAYS. 21 tablet 6  . pravastatin (PRAVACHOL) 20 MG tablet Take 20 mg by mouth daily.    Marland Kitchen triamcinolone ointment (KENALOG) 0.5 % Apply 1 application topically 2 (two) times daily. 30 g 0   No current facility-administered medications for this visit.    OBJECTIVE: African-American woman examined in a wheelchair  Vitals:   05/27/20 1449  BP: (!) 143/66  Pulse: 81  Resp: 17  Temp: 97.7 F (36.5 C)  SpO2: 100%     Body mass index is 21.25 kg/m.   Wt Readings from Last 3  Encounters:  05/27/20 108 lb 12.8 oz (49.4 kg)  04/02/20 112 lb 1.6 oz (50.8 kg)  01/23/20 115 lb (52.2 kg)  ECOG FS:2 - Symptomatic, <50% confined to bed  Sclerae unicteric, EOMs intact Wearing a mask No cervical or supraclavicular adenopathy Lungs no rales or rhonchi Heart regular rate and rhythm Abd soft, nontender, positive bowel sounds MSK no focal spinal tenderness, no upper extremity lymphedema Neuro: nonfocal, well oriented, appropriate affect Breasts: I do not palpate a mass in either breast.  Both axillae are benign.   LAB RESULTS:  CMP     Component Value Date/Time   NA 146 (H) 05/27/2020 1401   K 3.7 05/27/2020 1401   CL 105 05/27/2020 1401   CO2 28 05/27/2020 1401   GLUCOSE 91 05/27/2020 1401   BUN 26 (H) 05/27/2020 1401   CREATININE 1.03 (H) 05/27/2020 1401    CALCIUM 9.8 05/27/2020 1401   PROT 7.1 05/27/2020 1401   ALBUMIN 4.2 05/27/2020 1401   AST 17 05/27/2020 1401   ALT 9 05/27/2020 1401   ALKPHOS 38 05/27/2020 1401   BILITOT 0.9 05/27/2020 1401   GFRNONAA 53 (L) 05/27/2020 1401   GFRAA 47 (L) 11/01/2019 1142    No results found for: TOTALPROTELP, ALBUMINELP, A1GS, A2GS, BETS, BETA2SER, GAMS, MSPIKE, SPEI  Lab Results  Component Value Date   WBC 4.0 05/27/2020   NEUTROABS 2.5 05/27/2020   HGB 9.6 (L) 05/27/2020   HCT 29.2 (L) 05/27/2020   MCV 97.7 05/27/2020   PLT 264 05/27/2020    No results found for: LABCA2  No components found for: VFIEPP295  No results for input(s): INR in the last 168 hours.  No results found for: LABCA2  No results found for: JOA416  No results found for: SAY301  No results found for: SWF093  No results found for: CA2729  No components found for: HGQUANT  No results found for: CEA1 / No results found for: CEA1   No results found for: AFPTUMOR  No results found for: CHROMOGRNA  No results found for: KPAFRELGTCHN, LAMBDASER, KAPLAMBRATIO (kappa/lambda light chains)  No results found for: HGBA, HGBA2QUANT, HGBFQUANT, HGBSQUAN (Hemoglobinopathy evaluation)   No results found for: LDH  No results found for: IRON, TIBC, IRONPCTSAT (Iron and TIBC)  No results found for: FERRITIN  Urinalysis    Component Value Date/Time   COLORURINE YELLOW 11/01/2019 1300   APPEARANCEUR HAZY (A) 11/01/2019 1300   LABSPEC 1.018 11/01/2019 1300   PHURINE 5.0 11/01/2019 1300   GLUCOSEU NEGATIVE 11/01/2019 1300   HGBUR NEGATIVE 11/01/2019 1300   BILIRUBINUR NEGATIVE 11/01/2019 1300   KETONESUR NEGATIVE 11/01/2019 1300   PROTEINUR NEGATIVE 11/01/2019 1300   UROBILINOGEN 0.2 07/05/2012 1610   NITRITE NEGATIVE 11/01/2019 1300   LEUKOCYTESUR MODERATE (A) 11/01/2019 1300    STUDIES: US BREAST LTD UNI LEFT INC AXILLA  Result Date: 05/20/2020 CLINICAL DATA:  Follow-up patient's breast cancers. Patient  receiving chemotherapy. EXAM: ULTRASOUND OF THE BILATERAL BREAST COMPARISON:  Previous exam(s). FINDINGS: Targeted ultrasound is performed, showing a persistent mass between 12 and 2 o'clock in the left breast, 3 cm from the nipple measuring 2.5 x 1.0 x 1.8 cm today versus 3.3 x 1.8 by 3.4 cm on October 04, 2019, improved in the interval. Five axillary nodes are identified on the left, improved in the interval. The previously biopsied node has specifically improved. The cortex associated with the previously biopsied lymph node is 3 mm in thickness today versus 5 mm previously. The mass in the right  breast at 12 o'clock, 3 cm from the nipple measures 9 x 9 by 7 mm today versus 9 x 11 x 8 mm previously, similar in the interval. The second mass at 9:30, 3 cm from the nipple appears improved. This mass is measured at 2.1 by 1.2 x 0.9 cm today versus 2.4 by 1.1 x 1.8 cm previously. This mass appears less contiguous in the current measurements probably overestimate the amount of remaining malignancy in this region. IMPRESSION: Interval improvement in the patient's bilateral breast malignancies and the abnormal right axillary lymph nodes as above. RECOMMENDATION: Recommend continued oncologic follow up. I have discussed the findings and recommendations with the patient. If applicable, a reminder letter will be sent to the patient regarding the next appointment. BI-RADS CATEGORY  6: Known biopsy-proven malignancy. Electronically Signed   By: Dorise Bullion III M.D   On: 05/20/2020 14:00   US BREAST LTD UNI RIGHT INC AXILLA  Result Date: 05/20/2020 CLINICAL DATA:  Follow-up patient's breast cancers. Patient receiving chemotherapy. EXAM: ULTRASOUND OF THE BILATERAL BREAST COMPARISON:  Previous exam(s). FINDINGS: Targeted ultrasound is performed, showing a persistent mass between 12 and 2 o'clock in the left breast, 3 cm from the nipple measuring 2.5 x 1.0 x 1.8 cm today versus 3.3 x 1.8 by 3.4 cm on October 04, 2019,  improved in the interval. Five axillary nodes are identified on the left, improved in the interval. The previously biopsied node has specifically improved. The cortex associated with the previously biopsied lymph node is 3 mm in thickness today versus 5 mm previously. The mass in the right breast at 12 o'clock, 3 cm from the nipple measures 9 x 9 by 7 mm today versus 9 x 11 x 8 mm previously, similar in the interval. The second mass at 9:30, 3 cm from the nipple appears improved. This mass is measured at 2.1 by 1.2 x 0.9 cm today versus 2.4 by 1.1 x 1.8 cm previously. This mass appears less contiguous in the current measurements probably overestimate the amount of remaining malignancy in this region. IMPRESSION: Interval improvement in the patient's bilateral breast malignancies and the abnormal right axillary lymph nodes as above. RECOMMENDATION: Recommend continued oncologic follow up. I have discussed the findings and recommendations with the patient. If applicable, a reminder letter will be sent to the patient regarding the next appointment. BI-RADS CATEGORY  6: Known biopsy-proven malignancy. Electronically Signed   By: Dorise Bullion III M.D   On: 05/20/2020 14:00     ELIGIBLE FOR AVAILABLE RESEARCH PROTOCOL: AET  ASSESSMENT: 87 y.o. [place] woman status post bilateral breast biopsies on 05/17/2019 as follows:  (a) on the left upper outer quadrant, a clinical T2 N2, stage IIIA invasive ductal carcinoma,grade 2 or 3, estrogen receptor positive, progesterone receptor negative, with no HER-2 amplification and an MIB-1 of 12%  (b) on the right overlapping sites, 2 morphologically distinct invasive ductal carcinomas, both clinically T1c N0, stage IA, grade 1 or 2, estrogen receptor positive, progesterone receptor negative, with no HER-2 amplification and an MIB-1 of 5%  (c) CT of the chest abdomen and pelvis 06/10/2019 shows left axillary, retropectoral and supraclavicular lymph nodes, and a 4.1 cm  left breast mass; no right axillary adenopathy; 0.6 cm nonspecific left lower lobe pulmonary nodule; no liver or bone lesions.  (d) bone scan 06/26/2019--no evidence of skeletal metastases    (1) neoadjuvant anastrozole started 05/28/2019, palbociclib added 05/30/2019 125 mg a day, 21 on 7 off  (a) palbociclib dose decreased cycle  2 (May 2021) to 100 mg daily, 21/7  (b) bilateral breast and axilla ultrasonography 10/04/2019 shows a good initial response to treatment  (c) repeat bilateral ultrasonography 046 2022 continues to show evidence of response  (2) genetics testing declined  (3) definitive surgery pending  (4) adjuvant radiation to follow   PLAN: Irelyn is tolerating her treatment well and it continues to work for her.  We discussed the fact that my experience in cases like hers is that the cancer will eventually stop drinking and then after a period start growing.  At that point surgery will be inevitable.  Sometimes it occurs that by the time the cancer does start growing the patient's condition has declined and then surgery is not possible.  Accordingly we discussed proceeding directly to surgery today.  After going over the pros and cons what the patient and family would like to do is give it another 6 more months and restage.  I am very comfortable with that decision considering how well that he is doing at this point.  Accordingly she will have repeat ultrasonography again in 6 months from now and she will see me at that time to discuss the possibility of surgery further  Total encounter time 30 minutes.Sarajane Jews C. Melek Pownall, MD 05/27/20 8:52 PM Medical Oncology and Hematology [facility] [place], [place] [address] Tel. [phone]    Fax. [phone]   I, Wilburn Mylar, am acting as scribe for Dr. Virgie [name]. Alexyss Balzarini.  I, Lurline Del MD, have reviewed the above documentation for accuracy and completeness, and I agree with the  above.    *Total Encounter Time as defined by the Centers for Medicare and Medicaid Services includes, in addition to the face-to-face time of a patient visit (documented in the note above) non-face-to-face time: obtaining and reviewing outside history, ordering and reviewing medications, tests or procedures, care coordination (communications with other health care professionals or caregivers) and documentation in the medical record.

## 2020-05-27 ENCOUNTER — Inpatient Hospital Stay: Payer: Medicare HMO | Attending: Oncology

## 2020-05-27 ENCOUNTER — Other Ambulatory Visit: Payer: Self-pay

## 2020-05-27 ENCOUNTER — Other Ambulatory Visit: Payer: Medicare HMO

## 2020-05-27 ENCOUNTER — Ambulatory Visit: Payer: Medicare HMO | Admitting: Oncology

## 2020-05-27 ENCOUNTER — Encounter: Payer: Self-pay | Admitting: *Deleted

## 2020-05-27 ENCOUNTER — Inpatient Hospital Stay (HOSPITAL_BASED_OUTPATIENT_CLINIC_OR_DEPARTMENT_OTHER): Payer: Medicare HMO | Admitting: Oncology

## 2020-05-27 VITALS — BP 143/66 | HR 81 | Temp 97.7°F | Resp 17 | Ht 60.0 in | Wt 108.8 lb

## 2020-05-27 DIAGNOSIS — I10 Essential (primary) hypertension: Secondary | ICD-10-CM | POA: Insufficient documentation

## 2020-05-27 DIAGNOSIS — C50811 Malignant neoplasm of overlapping sites of right female breast: Secondary | ICD-10-CM

## 2020-05-27 DIAGNOSIS — M129 Arthropathy, unspecified: Secondary | ICD-10-CM | POA: Insufficient documentation

## 2020-05-27 DIAGNOSIS — K219 Gastro-esophageal reflux disease without esophagitis: Secondary | ICD-10-CM | POA: Insufficient documentation

## 2020-05-27 DIAGNOSIS — C50412 Malignant neoplasm of upper-outer quadrant of left female breast: Secondary | ICD-10-CM | POA: Insufficient documentation

## 2020-05-27 DIAGNOSIS — Z170 Estrogen receptor positive status [ER+]: Secondary | ICD-10-CM | POA: Diagnosis not present

## 2020-05-27 DIAGNOSIS — Z79899 Other long term (current) drug therapy: Secondary | ICD-10-CM | POA: Diagnosis not present

## 2020-05-27 DIAGNOSIS — Z8673 Personal history of transient ischemic attack (TIA), and cerebral infarction without residual deficits: Secondary | ICD-10-CM | POA: Insufficient documentation

## 2020-05-27 LAB — CMP (CANCER CENTER ONLY)
ALT: 9 U/L (ref 0–44)
AST: 17 U/L (ref 15–41)
Albumin: 4.2 g/dL (ref 3.5–5.0)
Alkaline Phosphatase: 38 U/L (ref 38–126)
Anion gap: 13 (ref 5–15)
BUN: 26 mg/dL — ABNORMAL HIGH (ref 8–23)
CO2: 28 mmol/L (ref 22–32)
Calcium: 9.8 mg/dL (ref 8.9–10.3)
Chloride: 105 mmol/L (ref 98–111)
Creatinine: 1.03 mg/dL — ABNORMAL HIGH (ref 0.44–1.00)
GFR, Estimated: 53 mL/min — ABNORMAL LOW (ref >60)
Glucose, Bld: 91 mg/dL (ref 70–99)
Potassium: 3.7 mmol/L (ref 3.5–5.1)
Sodium: 146 mmol/L — ABNORMAL HIGH (ref 135–145)
Total Bilirubin: 0.9 mg/dL (ref 0.3–1.2)
Total Protein: 7.1 g/dL (ref 6.5–8.1)

## 2020-05-27 LAB — CBC WITH DIFFERENTIAL/PLATELET
Abs Immature Granulocytes: 0.01 10*3/uL (ref 0.00–0.07)
Basophils Absolute: 0.1 10*3/uL (ref 0.0–0.1)
Basophils Relative: 2 %
Eosinophils Absolute: 0.1 10*3/uL (ref 0.0–0.5)
Eosinophils Relative: 2 %
HCT: 29.2 % — ABNORMAL LOW (ref 36.0–46.0)
Hemoglobin: 9.6 g/dL — ABNORMAL LOW (ref 12.0–15.0)
Immature Granulocytes: 0 %
Lymphocytes Relative: 26 %
Lymphs Abs: 1.1 10*3/uL (ref 0.7–4.0)
MCH: 32.1 pg (ref 26.0–34.0)
MCHC: 32.9 g/dL (ref 30.0–36.0)
MCV: 97.7 fL (ref 80.0–100.0)
Monocytes Absolute: 0.3 10*3/uL (ref 0.1–1.0)
Monocytes Relative: 7 %
Neutro Abs: 2.5 10*3/uL (ref 1.7–7.7)
Neutrophils Relative %: 63 %
Platelets: 264 10*3/uL (ref 150–400)
RBC: 2.99 MIL/uL — ABNORMAL LOW (ref 3.87–5.11)
RDW: 17.6 % — ABNORMAL HIGH (ref 11.5–15.5)
WBC: 4 10*3/uL (ref 4.0–10.5)
nRBC: 0 % (ref 0.0–0.2)

## 2020-06-09 ENCOUNTER — Other Ambulatory Visit (HOSPITAL_COMMUNITY): Payer: Self-pay

## 2020-06-09 MED FILL — Palbociclib Tab 100 MG: ORAL | 28 days supply | Qty: 21 | Fill #0 | Status: AC

## 2020-06-10 ENCOUNTER — Other Ambulatory Visit (HOSPITAL_COMMUNITY): Payer: Self-pay

## 2020-06-24 ENCOUNTER — Other Ambulatory Visit: Payer: Self-pay | Admitting: Oncology

## 2020-06-24 ENCOUNTER — Other Ambulatory Visit (HOSPITAL_COMMUNITY): Payer: Self-pay

## 2020-06-25 ENCOUNTER — Other Ambulatory Visit (HOSPITAL_COMMUNITY): Payer: Self-pay

## 2020-06-26 ENCOUNTER — Other Ambulatory Visit: Payer: Self-pay | Admitting: *Deleted

## 2020-06-26 MED ORDER — ANASTROZOLE 1 MG PO TABS: 1.0000 mg | ORAL_TABLET | Freq: Every day | ORAL | 4 refills | Status: AC

## 2020-07-02 ENCOUNTER — Other Ambulatory Visit (HOSPITAL_COMMUNITY): Payer: Self-pay

## 2020-07-07 ENCOUNTER — Other Ambulatory Visit (HOSPITAL_COMMUNITY): Payer: Self-pay

## 2020-07-07 MED FILL — Palbociclib Tab 100 MG: ORAL | 28 days supply | Qty: 21 | Fill #1 | Status: AC

## 2020-07-09 ENCOUNTER — Other Ambulatory Visit (HOSPITAL_COMMUNITY): Payer: Self-pay

## 2020-08-03 ENCOUNTER — Other Ambulatory Visit (HOSPITAL_COMMUNITY): Payer: Self-pay

## 2020-08-03 MED FILL — Palbociclib Tab 100 MG: ORAL | 28 days supply | Qty: 21 | Fill #2 | Status: AC

## 2020-08-05 ENCOUNTER — Other Ambulatory Visit (HOSPITAL_COMMUNITY): Payer: Self-pay

## 2020-08-31 ENCOUNTER — Other Ambulatory Visit (HOSPITAL_COMMUNITY): Payer: Self-pay

## 2020-08-31 ENCOUNTER — Telehealth: Payer: Self-pay | Admitting: *Deleted

## 2020-08-31 ENCOUNTER — Other Ambulatory Visit: Payer: Self-pay | Admitting: Oncology

## 2020-08-31 DIAGNOSIS — C50412 Malignant neoplasm of upper-outer quadrant of left female breast: Secondary | ICD-10-CM

## 2020-08-31 DIAGNOSIS — Z170 Estrogen receptor positive status [ER+]: Secondary | ICD-10-CM

## 2020-08-31 NOTE — Telephone Encounter (Signed)
No entry 

## 2020-09-02 ENCOUNTER — Other Ambulatory Visit (HOSPITAL_COMMUNITY): Payer: Self-pay

## 2020-09-02 ENCOUNTER — Other Ambulatory Visit: Payer: Self-pay | Admitting: *Deleted

## 2020-09-02 ENCOUNTER — Encounter: Payer: Self-pay | Admitting: *Deleted

## 2020-09-04 ENCOUNTER — Encounter: Payer: Medicare HMO | Admitting: Adult Health

## 2020-09-04 ENCOUNTER — Other Ambulatory Visit: Payer: Medicare HMO

## 2020-09-07 ENCOUNTER — Inpatient Hospital Stay (HOSPITAL_COMMUNITY)
Admission: EM | Admit: 2020-09-07 | Discharge: 2020-09-17 | DRG: 871 | Disposition: A | Discharge status: Home / Self Care | Payer: Medicare HMO | Attending: Student | Admitting: Student

## 2020-09-07 ENCOUNTER — Telehealth: Payer: Self-pay | Admitting: *Deleted

## 2020-09-07 ENCOUNTER — Encounter: Payer: Self-pay | Admitting: *Deleted

## 2020-09-07 ENCOUNTER — Emergency Department (HOSPITAL_COMMUNITY): Payer: Medicare HMO

## 2020-09-07 ENCOUNTER — Other Ambulatory Visit: Payer: Self-pay | Admitting: Oncology

## 2020-09-07 DIAGNOSIS — Z170 Estrogen receptor positive status [ER+]: Secondary | ICD-10-CM

## 2020-09-07 DIAGNOSIS — R17 Unspecified jaundice: Secondary | ICD-10-CM | POA: Diagnosis not present

## 2020-09-07 DIAGNOSIS — R404 Transient alteration of awareness: Secondary | ICD-10-CM | POA: Diagnosis not present

## 2020-09-07 DIAGNOSIS — D6959 Other secondary thrombocytopenia: Secondary | ICD-10-CM | POA: Diagnosis present

## 2020-09-07 DIAGNOSIS — C50811 Malignant neoplasm of overlapping sites of right female breast: Secondary | ICD-10-CM | POA: Diagnosis present

## 2020-09-07 DIAGNOSIS — D84821 Immunodeficiency due to drugs: Secondary | ICD-10-CM | POA: Diagnosis not present

## 2020-09-07 DIAGNOSIS — A419 Sepsis, unspecified organism: Secondary | ICD-10-CM | POA: Diagnosis not present

## 2020-09-07 DIAGNOSIS — R300 Dysuria: Secondary | ICD-10-CM | POA: Diagnosis not present

## 2020-09-07 DIAGNOSIS — D630 Anemia in neoplastic disease: Secondary | ICD-10-CM | POA: Diagnosis present

## 2020-09-07 DIAGNOSIS — A4189 Other specified sepsis: Secondary | ICD-10-CM | POA: Diagnosis not present

## 2020-09-07 DIAGNOSIS — D696 Thrombocytopenia, unspecified: Secondary | ICD-10-CM

## 2020-09-07 DIAGNOSIS — Z7952 Long term (current) use of systemic steroids: Secondary | ICD-10-CM

## 2020-09-07 DIAGNOSIS — E43 Unspecified severe protein-calorie malnutrition: Secondary | ICD-10-CM | POA: Diagnosis not present

## 2020-09-07 DIAGNOSIS — N390 Urinary tract infection, site not specified: Secondary | ICD-10-CM

## 2020-09-07 DIAGNOSIS — T380X5A Adverse effect of glucocorticoids and synthetic analogues, initial encounter: Secondary | ICD-10-CM

## 2020-09-07 DIAGNOSIS — Z90710 Acquired absence of both cervix and uterus: Secondary | ICD-10-CM

## 2020-09-07 DIAGNOSIS — Z7189 Other specified counseling: Secondary | ICD-10-CM | POA: Diagnosis not present

## 2020-09-07 DIAGNOSIS — R079 Chest pain, unspecified: Secondary | ICD-10-CM | POA: Diagnosis not present

## 2020-09-07 DIAGNOSIS — Z681 Body mass index (BMI) 19 or less, adult: Secondary | ICD-10-CM

## 2020-09-07 DIAGNOSIS — R64 Cachexia: Secondary | ICD-10-CM | POA: Diagnosis present

## 2020-09-07 DIAGNOSIS — D693 Immune thrombocytopenic purpura: Secondary | ICD-10-CM | POA: Diagnosis not present

## 2020-09-07 DIAGNOSIS — Z20822 Contact with and (suspected) exposure to covid-19: Secondary | ICD-10-CM | POA: Diagnosis present

## 2020-09-07 DIAGNOSIS — Z7401 Bed confinement status: Secondary | ICD-10-CM

## 2020-09-07 DIAGNOSIS — R531 Weakness: Secondary | ICD-10-CM | POA: Diagnosis not present

## 2020-09-07 DIAGNOSIS — E871 Hypo-osmolality and hyponatremia: Secondary | ICD-10-CM | POA: Diagnosis not present

## 2020-09-07 DIAGNOSIS — R6520 Severe sepsis without septic shock: Secondary | ICD-10-CM | POA: Diagnosis not present

## 2020-09-07 DIAGNOSIS — D649 Anemia, unspecified: Secondary | ICD-10-CM | POA: Diagnosis not present

## 2020-09-07 DIAGNOSIS — R54 Age-related physical debility: Secondary | ICD-10-CM | POA: Diagnosis present

## 2020-09-07 DIAGNOSIS — R7881 Bacteremia: Secondary | ICD-10-CM | POA: Diagnosis not present

## 2020-09-07 DIAGNOSIS — D559 Anemia due to enzyme disorder, unspecified: Secondary | ICD-10-CM

## 2020-09-07 DIAGNOSIS — I499 Cardiac arrhythmia, unspecified: Secondary | ICD-10-CM | POA: Diagnosis not present

## 2020-09-07 DIAGNOSIS — E876 Hypokalemia: Secondary | ICD-10-CM | POA: Diagnosis present

## 2020-09-07 DIAGNOSIS — I10 Essential (primary) hypertension: Secondary | ICD-10-CM | POA: Diagnosis present

## 2020-09-07 DIAGNOSIS — C50412 Malignant neoplasm of upper-outer quadrant of left female breast: Secondary | ICD-10-CM

## 2020-09-07 DIAGNOSIS — D638 Anemia in other chronic diseases classified elsewhere: Secondary | ICD-10-CM | POA: Diagnosis not present

## 2020-09-07 DIAGNOSIS — Z515 Encounter for palliative care: Secondary | ICD-10-CM | POA: Diagnosis not present

## 2020-09-07 DIAGNOSIS — Z743 Need for continuous supervision: Secondary | ICD-10-CM | POA: Diagnosis not present

## 2020-09-07 DIAGNOSIS — M549 Dorsalgia, unspecified: Secondary | ICD-10-CM | POA: Diagnosis not present

## 2020-09-07 DIAGNOSIS — R011 Cardiac murmur, unspecified: Secondary | ICD-10-CM | POA: Diagnosis not present

## 2020-09-07 DIAGNOSIS — Z8673 Personal history of transient ischemic attack (TIA), and cerebral infarction without residual deficits: Secondary | ICD-10-CM

## 2020-09-07 DIAGNOSIS — M1990 Unspecified osteoarthritis, unspecified site: Secondary | ICD-10-CM | POA: Diagnosis present

## 2020-09-07 DIAGNOSIS — K219 Gastro-esophageal reflux disease without esophagitis: Secondary | ICD-10-CM | POA: Diagnosis present

## 2020-09-07 DIAGNOSIS — R000 Tachycardia, unspecified: Secondary | ICD-10-CM | POA: Diagnosis not present

## 2020-09-07 DIAGNOSIS — M439 Deforming dorsopathy, unspecified: Secondary | ICD-10-CM | POA: Diagnosis not present

## 2020-09-07 DIAGNOSIS — R5381 Other malaise: Secondary | ICD-10-CM | POA: Diagnosis not present

## 2020-09-07 DIAGNOSIS — G9341 Metabolic encephalopathy: Secondary | ICD-10-CM | POA: Diagnosis present

## 2020-09-07 DIAGNOSIS — E785 Hyperlipidemia, unspecified: Secondary | ICD-10-CM | POA: Diagnosis present

## 2020-09-07 DIAGNOSIS — Z79899 Other long term (current) drug therapy: Secondary | ICD-10-CM

## 2020-09-07 DIAGNOSIS — I700 Atherosclerosis of aorta: Secondary | ICD-10-CM | POA: Diagnosis not present

## 2020-09-07 LAB — URINALYSIS, ROUTINE W REFLEX MICROSCOPIC
Bilirubin Urine: NEGATIVE
Glucose, UA: NEGATIVE mg/dL
Ketones, ur: 80 mg/dL — AB
Nitrite: POSITIVE — AB
Protein, ur: 30 mg/dL — AB
Specific Gravity, Urine: 1.016 (ref 1.005–1.030)
WBC, UA: >50 WBC/hpf — ABNORMAL HIGH (ref 0–5)
pH: 6 (ref 5.0–8.0)

## 2020-09-07 LAB — CBC WITH DIFFERENTIAL/PLATELET
Abs Immature Granulocytes: 0.14 10*3/uL — ABNORMAL HIGH (ref 0.00–0.07)
Basophils Absolute: 0 10*3/uL (ref 0.0–0.1)
Basophils Relative: 1 %
Eosinophils Absolute: 0 10*3/uL (ref 0.0–0.5)
Eosinophils Relative: 0 %
HCT: 27.8 % — ABNORMAL LOW (ref 36.0–46.0)
Hemoglobin: 9.2 g/dL — ABNORMAL LOW (ref 12.0–15.0)
Immature Granulocytes: 2 %
Lymphocytes Relative: 8 %
Lymphs Abs: 0.5 10*3/uL — ABNORMAL LOW (ref 0.7–4.0)
MCH: 33.5 pg (ref 26.0–34.0)
MCHC: 33.1 g/dL (ref 30.0–36.0)
MCV: 101.1 fL — ABNORMAL HIGH (ref 80.0–100.0)
Monocytes Absolute: 0.2 10*3/uL (ref 0.1–1.0)
Monocytes Relative: 3 %
Neutro Abs: 5.3 10*3/uL (ref 1.7–7.7)
Neutrophils Relative %: 86 %
Platelets: 31 10*3/uL — ABNORMAL LOW (ref 150–400)
RBC: 2.75 MIL/uL — ABNORMAL LOW (ref 3.87–5.11)
RDW: 14.1 % (ref 11.5–15.5)
WBC: 6.2 10*3/uL (ref 4.0–10.5)
nRBC: 0 % (ref 0.0–0.2)

## 2020-09-07 LAB — COMPREHENSIVE METABOLIC PANEL
ALT: 21 U/L (ref 0–44)
AST: 29 U/L (ref 15–41)
Albumin: 3 g/dL — ABNORMAL LOW (ref 3.5–5.0)
Alkaline Phosphatase: 67 U/L (ref 38–126)
Anion gap: 17 — ABNORMAL HIGH (ref 5–15)
BUN: 21 mg/dL (ref 8–23)
CO2: 23 mmol/L (ref 22–32)
Calcium: 9.2 mg/dL (ref 8.9–10.3)
Chloride: 95 mmol/L — ABNORMAL LOW (ref 98–111)
Creatinine, Ser: 0.91 mg/dL (ref 0.44–1.00)
GFR, Estimated: >60 mL/min (ref >60)
Glucose, Bld: 106 mg/dL — ABNORMAL HIGH (ref 70–99)
Potassium: 3.4 mmol/L — ABNORMAL LOW (ref 3.5–5.1)
Sodium: 135 mmol/L (ref 135–145)
Total Bilirubin: 1.5 mg/dL — ABNORMAL HIGH (ref 0.3–1.2)
Total Protein: 6.5 g/dL (ref 6.5–8.1)

## 2020-09-07 LAB — PROTIME-INR
INR: 1.2 (ref 0.8–1.2)
Prothrombin Time: 14.9 seconds (ref 11.4–15.2)

## 2020-09-07 LAB — RESP PANEL BY RT-PCR (FLU A&B, COVID) ARPGX2
Influenza A by PCR: NEGATIVE
Influenza B by PCR: NEGATIVE
SARS Coronavirus 2 by RT PCR: NEGATIVE

## 2020-09-07 LAB — LACTIC ACID, PLASMA: Lactic Acid, Venous: 1.2 mmol/L (ref 0.5–1.9)

## 2020-09-07 LAB — APTT: aPTT: 29 seconds (ref 24–36)

## 2020-09-07 LAB — TROPONIN I (HIGH SENSITIVITY): Troponin I (High Sensitivity): 60 ng/L — ABNORMAL HIGH (ref <18)

## 2020-09-07 MED ORDER — ONDANSETRON HCL 4 MG PO TABS: 4.0000 mg | ORAL_TABLET | Freq: Four times a day (QID) | ORAL | Status: DC | PRN

## 2020-09-07 MED ORDER — ACETAMINOPHEN 325 MG PO TABS
650.0000 mg | ORAL_TABLET | Freq: Once | ORAL | Status: AC
  Administered 2020-09-07: 650 mg via ORAL
  Filled 2020-09-07: qty 2

## 2020-09-07 MED ORDER — SODIUM CHLORIDE 0.9 % IV SOLN
1.0000 g | INTRAVENOUS | Status: DC
  Administered 2020-09-08: 1 g via INTRAVENOUS
  Filled 2020-09-07: qty 1

## 2020-09-07 MED ORDER — ACETAMINOPHEN 650 MG RE SUPP: 650.0000 mg | Freq: Four times a day (QID) | RECTAL | Status: DC | PRN

## 2020-09-07 MED ORDER — LACTATED RINGERS IV BOLUS (SEPSIS)
500.0000 mL | Freq: Once | INTRAVENOUS | Status: AC
  Administered 2020-09-07: 500 mL via INTRAVENOUS

## 2020-09-07 MED ORDER — LACTATED RINGERS IV SOLN: INTRAVENOUS | Status: DC

## 2020-09-07 MED ORDER — ONDANSETRON HCL 4 MG/2ML IJ SOLN: 4.0000 mg | Freq: Four times a day (QID) | INTRAMUSCULAR | Status: DC | PRN

## 2020-09-07 MED ORDER — SODIUM CHLORIDE 0.9 % IV SOLN
1.0000 g | Freq: Once | INTRAVENOUS | Status: AC
  Administered 2020-09-07: 1 g via INTRAVENOUS
  Filled 2020-09-07: qty 10

## 2020-09-07 MED ORDER — ACETAMINOPHEN 325 MG PO TABS
650.0000 mg | ORAL_TABLET | Freq: Four times a day (QID) | ORAL | Status: DC | PRN
  Administered 2020-09-08 – 2020-09-14 (×9): 650 mg via ORAL
  Filled 2020-09-07 (×9): qty 2

## 2020-09-07 NOTE — ED Triage Notes (Signed)
Patient complains of breast pain for about 2 weeks and weakness. Hx of breast cancer.

## 2020-09-07 NOTE — ED Provider Notes (Signed)
[place] DEPT Provider Note   CSN: [id number] Arrival date & time: 09/07/20  2050     History Chief Complaint  Patient presents with   Weakness   Breast Pain    [name] is a 87 y.o. female.  The history is provided by the patient, the EMS personnel and medical records.  Michelle [name] is a 87 y.o. female who presents to the Emergency Department complaining of weakness. She presents the emergency department by EMS for evaluation of weakness. She lives at home alone has been feeling unwell for the last week. She has been laying in bed for the last several days. She complains of body aches, chest wall pain, cough, nausea, dysuria. No reports of fevers, vomiting, diarrhea, abdominal pain. She does have a history of breast cancer. Symptoms are severe and constant in nature.    Past Medical History:  Diagnosis Date   Arthritis    Cancer ([place])    Bilaterial Breast Cancer   Dyspnea    GERD (gastroesophageal reflux disease)    Hypertension    Stroke [facility])    "mini stroke" per patient    Patient Active Problem List   Diagnosis Date Noted   Thrombocytopenia ([place]) 09/07/2020   Anemia 09/07/2020   Sepsis secondary to UTI ([place]) 09/07/2020   Malignant neoplasm of overlapping sites of right breast in female, estrogen receptor positive ([place]) 05/28/2019   Malignant neoplasm of upper-outer quadrant of left breast in female, estrogen receptor positive ([place]) 05/27/2019   Hypertension    Arthritis    Near syncope 07/11/2014   Syncope 07/11/2014   PULMONARY FIBROSIS 02/12/2007   GERD 11/30/2006   COUGH, CHRONIC 11/30/2006    Past Surgical History:  Procedure Laterality Date   ABDOMINAL HYSTERECTOMY     APPENDECTOMY     SHOULDER SURGERY     Right     OB History   No obstetric history on file.     Family History  Adopted: Yes    Social History   Tobacco Use   Smoking status: Never   Smokeless tobacco: Never  Vaping Use   Vaping  Use: Never used  Substance Use Topics   Alcohol use: No   Drug use: No    Frequency: 3.0 times per week    Home Medications Prior to Admission medications   Medication Sig Start Date End Date Taking? Authorizing Provider  acetaminophen (TYLENOL) 325 MG tablet Take 325-650 mg by mouth See admin instructions. Take 650 mg by mouth in the morning and an additional 325 mg in the evening    [provider]  anastrozole (ARIMIDEX) 1 MG tablet Take 1 tablet (1 mg total) by mouth daily. 06/26/20   Magrinat, Virgie Dad, MD  Calcium Carb-Cholecalciferol (CALCIUM 600+D3) 600-800 MG-UNIT TABS Take 2 tablets by mouth in the morning.    [provider]  diphenhydrAMINE (BENADRYL) 12.5 MG/5ML liquid Take 12.5-25 mg by mouth 4 (four) times daily as needed for allergies.    [provider]  esomeprazole (NEXIUM) 20 MG capsule Take 20 mg by mouth daily as needed (acid reflux).    [provider]  gabapentin (NEURONTIN) 100 MG capsule Take 100 mg by mouth 3 (three) times daily. 01/01/20   [provider]  losartan-hydrochlorothiazide (HYZAAR) 100-12.5 MG per tablet Take 1 tablet by mouth in the morning.    [provider]  palbociclib (IBRANCE) 100 MG tablet TAKE 1 TABLET (100 MG TOTAL) BY MOUTH DAILY. TAKE FOR  21 DAYS ON, 7 DAYS OFF, REPEAT EVERY 28 DAYS. 02/25/20 02/24/21  Magrinat, Virgie Dad, MD  pravastatin (PRAVACHOL) 20 MG tablet Take 20 mg by mouth daily.    [provider]  triamcinolone ointment (KENALOG) 0.5 % Apply 1 application topically 2 (two) times daily. 12/27/19   Gardenia Phlegm, NP    Allergies    Patient has no active allergies.  Review of Systems   Review of Systems  All other systems reviewed and are negative.  Physical Exam Updated Vital Signs BP (!) 142/66   Pulse (!) 102   Temp (!) 101.9 F (38.8 C) (Rectal)   Resp (!) 22   Ht 5' (1.524 m)   Wt 47.2 kg   SpO2 97%   BMI 20.31 kg/m   Physical Exam Vitals  and nursing note reviewed.  Constitutional:      Appearance: She is well-developed.     Comments: frail  HENT:     Head: Normocephalic and atraumatic.  Cardiovascular:     Rate and Rhythm: Normal rate and regular rhythm.     Heart sounds: No murmur heard. Pulmonary:     Effort: Pulmonary effort is normal. No respiratory distress.     Breath sounds: Normal breath sounds.  Abdominal:     Palpations: Abdomen is soft.     Tenderness: There is no guarding or rebound.     Comments: Mild generalized abdominal tenderness  Musculoskeletal:        General: No swelling or tenderness.  Skin:    General: Skin is warm and dry.  Neurological:     Mental Status: She is alert and oriented to person, place, and time.     Comments: Generalized weakness  Psychiatric:        Behavior: Behavior normal.    ED Results / Procedures / Treatments   Labs (all labs ordered are listed, but only abnormal results are displayed) Labs Reviewed  COMPREHENSIVE METABOLIC PANEL - Abnormal; Notable for the following components:      Result Value   Potassium 3.4 (*)    Chloride 95 (*)    Glucose, Bld 106 (*)    Albumin 3.0 (*)    Total Bilirubin 1.5 (*)    Anion gap 17 (*)    All other components within normal limits  CBC WITH DIFFERENTIAL/PLATELET - Abnormal; Notable for the following components:   RBC 2.75 (*)    Hemoglobin 9.2 (*)    HCT 27.8 (*)    MCV 101.1 (*)    Platelets 31 (*)    Lymphs Abs 0.5 (*)    Abs Immature Granulocytes 0.14 (*)    All other components within normal limits  URINALYSIS, ROUTINE W REFLEX MICROSCOPIC - Abnormal; Notable for the following components:   APPearance CLOUDY (*)    Hgb urine dipstick MODERATE (*)    Ketones, ur 80 (*)    Protein, ur 30 (*)    Nitrite POSITIVE (*)    Leukocytes,Ua LARGE (*)    WBC, UA >50 (*)    Bacteria, UA MANY (*)    All other components within normal limits  TROPONIN I (HIGH SENSITIVITY) - Abnormal; Notable for the following components:    Troponin I (High Sensitivity) 60 (*)    All other components within normal limits  RESP PANEL BY RT-PCR (FLU A&B, COVID) ARPGX2  URINE CULTURE  CULTURE, BLOOD (ROUTINE X 2)  CULTURE, BLOOD (ROUTINE X 2)  LACTIC ACID, PLASMA  PROTIME-INR  APTT  DIC (  DISSEMINATED INTRAVASCULAR COAGULATION)PANEL  PROTIME-INR  CORTISOL-AM, BLOOD  PROCALCITONIN  CBC  COMPREHENSIVE METABOLIC PANEL    EKG EKG Interpretation  Date/Time:  Monday September 07 2020 21:49:22 EDT Ventricular Rate:  99 PR Interval:  153 QRS Duration: 72 QT Interval:  318 QTC Calculation: 408 R Axis:   60 Text Interpretation: Sinus tachycardia Atrial premature complex Confirmed by Quintella Reichert [phone]) on 09/07/2020 11:04:51 PM  Radiology DG Chest Port 1 View  Result Date: 09/07/2020 CLINICAL DATA:  Questionable sepsis - evaluate for abnormality EXAM: PORTABLE CHEST 1 VIEW COMPARISON:  Radiograph 07/05/2012, chest CT 06/07/2019 FINDINGS: Unchanged cardiomediastinal silhouette. Aortic arch calcifications. There is no focal airspace disease. There is no large pleural effusion or visible pneumothorax. There is no acute osseous abnormality. Dextroconvex curvature of the spine. IMPRESSION: No focal airspace consolidation. Electronically Signed   By: Maurine Simmering   On: 09/07/2020 22:01    Procedures Procedures  CRITICAL CARE Performed by: Quintella Reichert   Total critical care time: 35 minutes  Critical care time was exclusive of separately billable procedures and treating other patients.  Critical care was necessary to treat or prevent imminent or life-threatening deterioration.  Critical care was time spent personally by me on the following activities: development of treatment plan with patient and/or surrogate as well as nursing, discussions with consultants, evaluation of patient's response to treatment, examination of patient, obtaining history from patient or surrogate, ordering and performing treatments and interventions,  ordering and review of laboratory studies, ordering and review of radiographic studies, pulse oximetry and re-evaluation of patient's condition.  Medications Ordered in ED Medications  acetaminophen (TYLENOL) tablet 650 mg (has no administration in time range)    Or  acetaminophen (TYLENOL) suppository 650 mg (has no administration in time range)  ondansetron (ZOFRAN) tablet 4 mg (has no administration in time range)    Or  ondansetron (ZOFRAN) injection 4 mg (has no administration in time range)  cefTRIAXone (ROCEPHIN) 1 g in sodium chloride 0.9 % 100 mL IVPB (has no administration in time range)  lactated ringers infusion (has no administration in time range)  lactated ringers bolus 500 mL (500 mLs Intravenous New Bag/Given 09/07/20 2246)  acetaminophen (TYLENOL) tablet 650 mg (650 mg Oral Given 09/07/20 2255)  cefTRIAXone (ROCEPHIN) 1 g in sodium chloride 0.9 % 100 mL IVPB (1 g Intravenous New Bag/Given 09/07/20 2254)    ED Course  I have reviewed the triage vital signs and the nursing notes.  Pertinent labs & imaging results that were available during my care of the patient were reviewed by me and considered in my medical decision making (see chart for details).    MDM Rules/Calculators/A&P                          Pt with hx/o breast cancer here for evaluation of one week of malaise, dysuria and decreased activity. She is ill appearing on evaluation. UA is consistent with UTI and she was started on antibiotics. She was also treated with IV fluids. CBC significant for thrombocytopenia, no reports of spontaneous bleeding at home. Discussed with patient and daughter findings of studies recommendation for mission and they are in agreement treatment plan. Hospitalist consulted for admission.  Final Clinical Impression(s) / ED Diagnoses Final diagnoses:  Acute UTI  Sepsis with acute organ dysfunction without septic shock, due to unspecified organism, unspecified type [facility])    Rx / DC  Orders ED Discharge Orders     None  Quintella Reichert, MD 09/07/20 [phone]

## 2020-09-07 NOTE — Progress Notes (Signed)
Elink following for Sepsis Protocol 

## 2020-09-07 NOTE — Telephone Encounter (Signed)
Daughter states pt has been not eating, sleeping a lot, weak and has not been able to get up to go to the BR. Is wearing a diaper and it hurts when she urinates. She is scheduled for [facility] tomorrow. If they do not think she can get here tomorrow, they are going to call EMS to take her to the ED.

## 2020-09-07 NOTE — ED Notes (Signed)
Patient is in gown, with daughter at bedside. Patient appears weak and complains of burning when urinating.

## 2020-09-07 NOTE — Progress Notes (Deleted)
[place]   [phone] [phone]) [phone]   Clinic Follow up Note   Patient Care Team: [name], MD as PCP - General (Internal Medicine) Magrinat, Virgie Dad, MD as Consulting Physician (Oncology) Donnie Mesa, MD as Consulting Physician (General Surgery) Mauro Kaufmann, RN as Oncology Nurse Navigator Rockwell Germany, RN as Oncology Nurse Navigator 09/07/2020  CHIEF COMPLAINT: Symptom management for weakness, lethargy decreased p.o., and dysuria in the setting of left breast cancer  SUMMARY OF ONCOLOGIC HISTORY: Oncology History  Malignant neoplasm of upper-outer quadrant of left breast in female, estrogen receptor positive ([place])  05/17/2019 Cancer Staging   Staging form: Breast, AJCC 8th Edition - Clinical stage from 05/17/2019: Stage IIIA (cT3, cN1, cM0, G2, ER+, PR-, HER2-) - Signed by Gardenia Phlegm, NP on 05/29/2019    05/27/2019 Initial Diagnosis   Malignant neoplasm of upper-outer quadrant of left breast in female, estrogen receptor positive ([place])    Malignant neoplasm of overlapping sites of right breast in female, estrogen receptor positive ([place])  05/17/2019 Cancer Staging   Staging form: Breast, AJCC 8th Edition - Clinical stage from 05/17/2019: Stage IIA (cT2, cN0, cM0, G1, ER+, PR-, HER2-) - Signed by Gardenia Phlegm, NP on 05/29/2019    05/28/2019 Initial Diagnosis   Malignant neoplasm of overlapping sites of right breast in female, estrogen receptor positive ([place])      CURRENT THERAPY: Ibrance 3 weeks on/1 week off and antiestrogen  INTERVAL HISTORY: Michelle [name] presents for symptom management visit.  Last seen by Dr. [name] on 05/27/2020.  She has continued anastrozole and Ibrance in the interim.   REVIEW OF SYSTEMS:   Constitutional: Denies fevers, chills or abnormal weight loss Eyes: Denies blurriness of vision Ears, nose, mouth, throat, and face: Denies mucositis or sore throat Respiratory: Denies cough,  dyspnea or wheezes Cardiovascular: Denies palpitation, chest discomfort or lower extremity swelling Gastrointestinal:  Denies nausea, heartburn or change in bowel habits Skin: Denies abnormal skin rashes Lymphatics: Denies new lymphadenopathy or easy bruising Neurological:Denies numbness, tingling or new weaknesses Behavioral/Psych: Mood is stable, no new changes  All other systems were reviewed with the patient and are negative.  MEDICAL HISTORY:  Past Medical History:  Diagnosis Date   Arthritis    Cancer ([place])    Bilaterial Breast Cancer   Dyspnea    GERD (gastroesophageal reflux disease)    Hypertension    Stroke ([place])    "mini stroke" per patient    SURGICAL HISTORY: Past Surgical History:  Procedure Laterality Date   ABDOMINAL HYSTERECTOMY     APPENDECTOMY     SHOULDER SURGERY     Right    I have reviewed the social history and family history with the patient and they are unchanged from previous note.  ALLERGIES:  has no active allergies.  MEDICATIONS:  Current Outpatient Medications  Medication Sig Dispense Refill   acetaminophen (TYLENOL) 325 MG tablet Take 325-650 mg by mouth See admin instructions. Take 650 mg by mouth in the morning and an additional 325 mg in the evening     anastrozole (ARIMIDEX) 1 MG tablet Take 1 tablet (1 mg total) by mouth daily. 90 tablet 4   Calcium Carb-Cholecalciferol (CALCIUM 600+D3) 600-800 MG-UNIT TABS Take 2 tablets by mouth in the morning.     diphenhydrAMINE (BENADRYL) 12.5 MG/5ML liquid Take 12.5-25 mg by mouth 4 (four) times daily as needed for allergies.     esomeprazole (NEXIUM) 20 MG capsule Take 20 mg by mouth daily  as needed (acid reflux).     gabapentin (NEURONTIN) 100 MG capsule Take 100 mg by mouth 3 (three) times daily.     losartan-hydrochlorothiazide (HYZAAR) 100-12.5 MG per tablet Take 1 tablet by mouth in the morning.     palbociclib (IBRANCE) 100 MG tablet TAKE 1 TABLET (100 MG TOTAL) BY MOUTH DAILY. TAKE FOR 21  DAYS ON, 7 DAYS OFF, REPEAT EVERY 28 DAYS. 21 tablet 6   pravastatin (PRAVACHOL) 20 MG tablet Take 20 mg by mouth daily.     triamcinolone ointment (KENALOG) 0.5 % Apply 1 application topically 2 (two) times daily. 30 g 0   No current facility-administered medications for this visit.    PHYSICAL EXAMINATION: ECOG PERFORMANCE STATUS: {CHL ONC ECOG PS:[phone]}  There were no vitals filed for this visit. There were no vitals filed for this visit.  GENERAL:alert, no distress and comfortable SKIN: skin color, texture, turgor are normal, no rashes or significant lesions EYES: normal, Conjunctiva are pink and non-injected, sclera clear OROPHARYNX:no exudate, no erythema and lips, buccal mucosa, and tongue normal  NECK: supple, thyroid normal size, non-tender, without nodularity LYMPH:  no palpable lymphadenopathy in the cervical, axillary or inguinal LUNGS: clear to auscultation and percussion with normal breathing effort HEART: regular rate & rhythm and no murmurs and no lower extremity edema ABDOMEN:abdomen soft, non-tender and normal bowel sounds Musculoskeletal:no cyanosis of digits and no clubbing  NEURO: alert & oriented x 3 with fluent speech, no focal motor/sensory deficits  LABORATORY DATA:  I have reviewed the data as listed CBC Latest Ref Rng & Units 05/27/2020 04/02/2020 01/23/2020  WBC 4.0 - 10.5 K/uL 4.0 3.3(L) 6.2  Hemoglobin 12.0 - 15.0 g/dL 9.6(L) 10.1(L) 10.2(L)  Hematocrit 36.0 - 46.0 % 29.2(L) 31.5(L) 31.4(L)  Platelets 150 - 400 K/uL 264 324 415(H)     CMP Latest Ref Rng & Units 05/27/2020 04/02/2020 01/23/2020  Glucose 70 - 99 mg/dL 91 91 85  BUN 8 - 23 mg/dL 26(H) 24(H) 18  Creatinine 0.44 - 1.00 mg/dL 1.03(H) 1.15(H) 0.94  Sodium 135 - 145 mmol/L 146(H) 142 144  Potassium 3.5 - 5.1 mmol/L 3.7 3.6 3.8  Chloride 98 - 111 mmol/L 105 104 104  CO2 22 - 32 mmol/L '28 27 29  ' Calcium 8.9 - 10.3 mg/dL 9.8 9.6 10.2  Total Protein 6.5 - 8.1 g/dL 7.1 7.3 7.6  Total  Bilirubin 0.3 - 1.2 mg/dL 0.9 1.1 0.7  Alkaline Phos 38 - 126 U/L 38 45 47  AST 15 - 41 U/L '17 19 26  ' ALT 0 - 44 U/L '9 10 19      ' RADIOGRAPHIC STUDIES: I have personally reviewed the radiological images as listed and agreed with the findings in the report. No results found.   ASSESSMENT & PLAN:  No problem-specific Assessment & Plan notes found for this encounter.   No orders of the defined types were placed in this encounter.  All questions were answered. The patient knows to call the clinic with any problems, questions or concerns. No barriers to learning was detected. I spent {CHL ONC TIME VISIT - NIOEV:[id number]} counseling the patient face to face. The total time spent in the appointment was {CHL ONC TIME VISIT - EXHBZ:[id number]} and more than 50% was on counseling and review of test results     Alla Feeling, NP 09/07/20

## 2020-09-08 ENCOUNTER — Encounter (HOSPITAL_COMMUNITY): Payer: Self-pay | Admitting: Internal Medicine

## 2020-09-08 ENCOUNTER — Inpatient Hospital Stay: Payer: Medicare HMO | Attending: Oncology

## 2020-09-08 ENCOUNTER — Encounter: Payer: Self-pay | Admitting: *Deleted

## 2020-09-08 ENCOUNTER — Inpatient Hospital Stay: Payer: Medicare HMO | Admitting: Nurse Practitioner

## 2020-09-08 DIAGNOSIS — R6520 Severe sepsis without septic shock: Secondary | ICD-10-CM

## 2020-09-08 DIAGNOSIS — D696 Thrombocytopenia, unspecified: Secondary | ICD-10-CM

## 2020-09-08 DIAGNOSIS — D649 Anemia, unspecified: Secondary | ICD-10-CM

## 2020-09-08 DIAGNOSIS — C50412 Malignant neoplasm of upper-outer quadrant of left female breast: Secondary | ICD-10-CM | POA: Diagnosis not present

## 2020-09-08 DIAGNOSIS — A419 Sepsis, unspecified organism: Secondary | ICD-10-CM

## 2020-09-08 DIAGNOSIS — N390 Urinary tract infection, site not specified: Secondary | ICD-10-CM | POA: Diagnosis not present

## 2020-09-08 LAB — PROCALCITONIN: Procalcitonin: 1.17 ng/mL

## 2020-09-08 LAB — CBC
HCT: 24.5 % — ABNORMAL LOW (ref 36.0–46.0)
Hemoglobin: 8.4 g/dL — ABNORMAL LOW (ref 12.0–15.0)
MCH: 34 pg (ref 26.0–34.0)
MCHC: 34.3 g/dL (ref 30.0–36.0)
MCV: 99.2 fL (ref 80.0–100.0)
Platelets: 22 10*3/uL — CL (ref 150–400)
RBC: 2.47 MIL/uL — ABNORMAL LOW (ref 3.87–5.11)
RDW: 13.6 % (ref 11.5–15.5)
WBC: 6 10*3/uL (ref 4.0–10.5)
nRBC: 0 % (ref 0.0–0.2)

## 2020-09-08 LAB — DIC (DISSEMINATED INTRAVASCULAR COAGULATION)PANEL
D-Dimer, Quant: 16.13 ug/mL-FEU — ABNORMAL HIGH (ref 0.00–0.50)
Fibrinogen: 436 mg/dL (ref 210–475)
INR: 1.2 (ref 0.8–1.2)
Platelets: 28 10*3/uL — CL (ref 150–400)
Prothrombin Time: 15 seconds (ref 11.4–15.2)
aPTT: 31 seconds (ref 24–36)

## 2020-09-08 LAB — MAGNESIUM: Magnesium: 1.9 mg/dL (ref 1.7–2.4)

## 2020-09-08 LAB — PROTIME-INR
INR: 1.1 (ref 0.8–1.2)
Prothrombin Time: 14.3 seconds (ref 11.4–15.2)

## 2020-09-08 LAB — COMPREHENSIVE METABOLIC PANEL
ALT: 21 U/L (ref 0–44)
AST: 27 U/L (ref 15–41)
Albumin: 2.6 g/dL — ABNORMAL LOW (ref 3.5–5.0)
Alkaline Phosphatase: 61 U/L (ref 38–126)
Anion gap: 10 (ref 5–15)
BUN: 20 mg/dL (ref 8–23)
CO2: 25 mmol/L (ref 22–32)
Calcium: 8.7 mg/dL — ABNORMAL LOW (ref 8.9–10.3)
Chloride: 99 mmol/L (ref 98–111)
Creatinine, Ser: 0.84 mg/dL (ref 0.44–1.00)
GFR, Estimated: >60 mL/min (ref >60)
Glucose, Bld: 107 mg/dL — ABNORMAL HIGH (ref 70–99)
Potassium: 3.3 mmol/L — ABNORMAL LOW (ref 3.5–5.1)
Sodium: 134 mmol/L — ABNORMAL LOW (ref 135–145)
Total Bilirubin: 1.4 mg/dL — ABNORMAL HIGH (ref 0.3–1.2)
Total Protein: 5.7 g/dL — ABNORMAL LOW (ref 6.5–8.1)

## 2020-09-08 LAB — CORTISOL-AM, BLOOD: Cortisol - AM: 29.8 ug/dL — ABNORMAL HIGH (ref 6.7–22.6)

## 2020-09-08 MED ORDER — POTASSIUM CHLORIDE CRYS ER 20 MEQ PO TBCR
30.0000 meq | EXTENDED_RELEASE_TABLET | ORAL | Status: AC
  Administered 2020-09-08 (×2): 30 meq via ORAL
  Filled 2020-09-08 (×2): qty 1

## 2020-09-08 MED ORDER — CHLORHEXIDINE GLUCONATE CLOTH 2 % EX PADS
6.0000 | MEDICATED_PAD | Freq: Every day | CUTANEOUS | Status: DC
  Administered 2020-09-08 – 2020-09-11 (×4): 6 via TOPICAL

## 2020-09-08 NOTE — ED Notes (Signed)
Patient is asleep in room.  

## 2020-09-08 NOTE — Progress Notes (Addendum)
PROGRESS NOTE    Michelle [name]  [id number] DOB: [birth year] DOA: 09/07/2020 PCP: [name], MD    Brief Narrative:  Michelle [name] is an 87 year old female with past medical history significant for breast cancer on Ibrance, essential hypertension who presented to [facility] ED on 7/25 with progressive weakness and burning with urination.  She reports feeling unwell for the last week and now bedbound for the last few days with associated body aches, chest wall pain, cough, nausea and dysuria.  Denies fever, no vomiting/diarrhea or abdominal pain.  In the ED, temperature 101.9 F, HR 102, RR 23, BP 128/64, SPO2 99% on room air.  Sodium 135, potassium 3.4, chloride 95, CO2 23, glucose 106, BUN 21, creatinine 0.91.  Anion gap 17.  Total bilirubin 1.5.  Lactic acid 1.2.  WBC 6.2, hemoglobin 9.2, platelets 31.  Procalcitonin 1.17.  PT 14.9, INR 1.2.  Influenza A/B PCR and COVID-19 PCR negative.  Urinalysis with large leukocytes, positive nitrite, many bacteria, greater than 50 WBCs.  Blood cultures were obtained.  Patient was started on empiric antibiotics.  TRH consulted for further evaluation and management of sepsis secondary to UTI with associated thrombocytopenia concerning for DIC versus chemotherapy induced.   Assessment & Plan:   Principal Problem:   Sepsis secondary to UTI [facility]) Active Problems:   Hypertension   Malignant neoplasm of upper-outer quadrant of left breast in female, estrogen receptor positive ([place])   Thrombocytopenia ([place])   Anemia   Sepsis, POA Urinary tract infection Patient presenting with progressive weakness, fatigue causing her to be more bedbound over the last few days.  On admission, patient was noted to have a fever, tachypnea, tachycardia, elevated procalcitonin, elevated total bilirubin, and significant thrombocytopenia.  Urinalysis consistent with UTI. --Blood cultures x2: Pending --Urine culture: Pending --Ceftriaxone 1 g IV every 24 hours --LR 75 mL per  hour --Continue monitor fever curve --Supportive care, antipyretics, antiemetics  Thrombocytopenia On admission patient's platelet level 31.  Now is trended down to 22.  Initially concern for possible DIC.  D-dimer elevated 16.13 with normal fibrinogen 436 and normal PT of 14.3.  Blood smear with schistocytes noted.  Also consideration for chemotherapy induced with Ibrance treatment for her breast cancer. --Medical oncology consulted for further evaluation --Plt 31>>22 (baseline 264 on 05/27/20) --Holding home Ibrance --Continue to monitor platelet level daily --Platelet transfusion for platelet count less than 10 or active bleeding  Hx breast cancer on chemotherapy Patient follows with medical oncology, Dr. [name] outpatient.  Currently on Ibrance. --Oncology consult as above --Holding Ibrance  Essential hypertension BP 98/64 this morning.  Home regimen includes losartan-HCTZ 100-12.5 mg p.o. daily. --Continue to hold home antihypertensives giving low blood pressure --Continue monitor BP closely  Hypokalemia Potassium 3.3 this morning with magnesium 1.9.  Will replete potassium. --Repeat electrolytes in a.m.  HLD: On pravastatin outpatient, will hold for now.  Weakness/fatigue/deconditioning: --PT/OT evaluation starting tomorrow  Severe protein calorie malnutrition Body mass index is 19.62 kg/m.  Patient with cachexia, fat depletion, muscle loss likely in the setting of malignancy on chemotherapy. -- Dietitian consultation   DVT prophylaxis: SCDs Start: 09/07/20 2328   Code Status: Full Code Family Communication: No family present at bedside this morning.  Emcyt update patient's daughter [name] via telephone unsuccessful as phone went straight to voicemail and mailbox full.  Disposition Plan:  Level of care: Med-Surg Status is: Inpatient  Remains inpatient appropriate because:Ongoing diagnostic testing needed not appropriate for outpatient work up, Unsafe d/c plan, IV  treatments appropriate due to intensity of illness or inability to take PO, and Inpatient level of care appropriate due to severity of illness  Dispo: The patient is from: Home              Anticipated d/c is to:  To be determined              Patient currently is not medically stable to d/c.   Difficult to place patient No   Consultants:  Medical oncology  Procedures:  None  Antimicrobials:  Ceftriaxone 7/25>>   Subjective: Patient seen examined at bedside, resting comfortably.  Fatigued.  States feels slightly better than initial presentation yesterday.  No family present at bedside this morning.  Continues with mild suprapubic discomfort.  No other complaints or concerns at this time.  Denies headache, no current fever/chills, no nausea/vomiting/diarrhea, no chest pain, no palpitations, no shortness of breath, no cough/congestion, no paresthesias.  No acute events overnight per nursing staff.  Stable for transfer to floor.  Objective: Vitals:   09/08/20 0730 09/08/20 0800 09/08/20 0840 09/08/20 0900  BP: (!) 100/46 (!) 99/41 (!) 139/57 (!) 141/47  Pulse: 78 77 81 78  Resp: (!) 21 (!) 21 (!) 28 (!) 22  Temp:   97.8 F (36.6 C)   TempSrc:   Oral   SpO2: 99% 99% 100% 100%  Weight:   47.1 kg   Height:   '5\' 1"'$  (1.549 m)     Intake/Output Summary (Last 24 hours) at 09/08/2020 1121 Last data filed at 09/08/2020 0001 Gross per 24 hour  Intake 600 ml  Output --  Net 600 ml   Filed Weights   09/07/20 2156 09/08/20 0840  Weight: 47.2 kg 47.1 kg    Examination:  General exam: Appears calm and comfortable appears thin/cachectic; chronically ill in appearance Respiratory system: Clear to auscultation. Respiratory effort normal.  On room air Cardiovascular system: S1 & S2 heard, RRR. No JVD, murmurs, rubs, gallops or clicks. No pedal edema. Gastrointestinal system: Abdomen is nondistended, soft and nontender. No organomegaly or masses felt. Normal bowel sounds heard. Central  nervous system: Alert and oriented. No focal neurological deficits. Extremities: Symmetric 5 x 5 power. Skin: No rashes, lesions or ulcers Psychiatry: Judgement and insight appear normal. Mood & affect appropriate.     Data Reviewed: I have personally reviewed following labs and imaging studies  CBC: Recent Labs  Lab 09/07/20 2208 09/07/20 2325 09/08/20 0339  WBC 6.2  --  6.0  NEUTROABS 5.3  --   --   HGB 9.2*  --  8.4*  HCT 27.8*  --  24.5*  MCV 101.1*  --  99.2  PLT 31* 28* 22*   Basic Metabolic Panel: Recent Labs  Lab 09/07/20 2208 09/08/20 0339  NA 135 134*  K 3.4* 3.3*  CL 95* 99  CO2 23 25  GLUCOSE 106* 107*  BUN 21 20  CREATININE 0.91 0.84  CALCIUM 9.2 8.7*  MG  --  1.9   GFR: Estimated Creatinine Clearance: 34.4 mL/min (by C-G formula based on SCr of 0.84 mg/dL). Liver Function Tests: Recent Labs  Lab 09/07/20 2208 09/08/20 0339  AST 29 27  ALT 21 21  ALKPHOS 67 61  BILITOT 1.5* 1.4*  PROT 6.5 5.7*  ALBUMIN 3.0* 2.6*   No results for input(s): LIPASE, AMYLASE in the last 168 hours. No results for input(s): AMMONIA in the last 168 hours. Coagulation Profile: Recent Labs  Lab 09/07/20 2208 09/07/20 2325 09/08/20 [id number]  INR 1.2 1.2 1.1   Cardiac Enzymes: No results for input(s): CKTOTAL, CKMB, CKMBINDEX, TROPONINI in the last 168 hours. BNP (last 3 results) No results for input(s): PROBNP in the last 8760 hours. HbA1C: No results for input(s): HGBA1C in the last 72 hours. CBG: No results for input(s): GLUCAP in the last 168 hours. Lipid Profile: No results for input(s): CHOL, HDL, LDLCALC, TRIG, CHOLHDL, LDLDIRECT in the last 72 hours. Thyroid Function Tests: No results for input(s): TSH, T4TOTAL, FREET4, T3FREE, THYROIDAB in the last 72 hours. Anemia Panel: No results for input(s): VITAMINB12, FOLATE, FERRITIN, TIBC, IRON, RETICCTPCT in the last 72 hours. Sepsis Labs: Recent Labs  Lab 09/07/20 2208 09/08/20 0339  PROCALCITON  --   1.17  LATICACIDVEN 1.2  --     Recent Results (from the past 240 hour(s))  Blood Culture (routine x 2)     Status: None (Preliminary result)   Collection Time: 09/07/20 10:08 PM   Specimen: BLOOD LEFT FOREARM  Result Value Ref Range Status   Specimen Description   Final    BLOOD LEFT FOREARM Performed at [facility], [place] [address] [place], [place] [address]    Special Requests   Final    BOTTLES DRAWN AEROBIC AND ANAEROBIC Blood Culture results may not be optimal due to an inadequate volume of blood received in culture bottles Performed at [place] [address], [place], [place] [address]    Culture   Final    NO GROWTH < 12 HOURS Performed at [place] [address] [place], [place] [address]    Report Status PENDING  Incomplete  Blood Culture (routine x 2)     Status: None (Preliminary result)   Collection Time: 09/07/20 10:08 PM   Specimen: BLOOD LEFT WRIST  Result Value Ref Range Status   Specimen Description   Final    BLOOD LEFT WRIST Performed at [place] [address], [place], [place] [address]    Special Requests   Final    BOTTLES DRAWN AEROBIC AND ANAEROBIC Blood Culture results may not be optimal due to an inadequate volume of blood received in culture bottles Performed at [place] [address], [place], [place] [address]    Culture   Final    NO GROWTH < 12 HOURS Performed at [place] [address], [place], [place] [address]    Report Status PENDING  Incomplete  Resp Panel by RT-PCR (Flu A&B, Covid) Nasopharyngeal Swab     Status: None   Collection Time: 09/07/20 10:10 PM   Specimen: Nasopharyngeal Swab; Nasopharyngeal(NP) swabs in vial transport medium  Result Value Ref Range Status   SARS Coronavirus 2 by RT PCR NEGATIVE NEGATIVE Final    Comment: (NOTE) SARS-CoV-2 target nucleic acids are NOT DETECTED.  The SARS-CoV-2 RNA is generally detectable in upper  respiratory specimens during the acute phase of infection. The lowest concentration of SARS-CoV-2 viral copies this assay can detect is 138 copies/mL. A negative result does not preclude SARS-Cov-2 infection and should not be used as the sole basis for treatment or other patient management decisions. A negative result may occur with  improper specimen collection/handling, submission of specimen other than nasopharyngeal swab, presence of viral mutation(s) within the areas targeted by this assay, and inadequate number of viral copies(<138 copies/mL). A negative result must be combined with clinical observations, patient history, and epidemiological information. The expected result is Negative.  Fact  Sheet for Patients:  EntrepreneurPulse.com.au  Fact Sheet for Healthcare Providers:  IncredibleEmployment.be  This test is no t yet approved or cleared by the [place] FDA and  has been authorized for detection and/or diagnosis of SARS-CoV-2 by FDA under an Emergency Use Authorization (EUA). This EUA will remain  in effect (meaning this test can be used) for the duration of the COVID-19 declaration under Section 564(b)(1) of the Act, 21 U.S.C.section 360bbb-3(b)(1), unless the authorization is terminated  or revoked sooner.       Influenza A by PCR NEGATIVE NEGATIVE Final   Influenza B by PCR NEGATIVE NEGATIVE Final    Comment: (NOTE) The Xpert Xpress SARS-CoV-2/FLU/RSV plus assay is intended as an aid in the diagnosis of influenza from Nasopharyngeal swab specimens and should not be used as a sole basis for treatment. Nasal washings and aspirates are unacceptable for Xpert Xpress SARS-CoV-2/FLU/RSV testing.  Fact Sheet for Patients: EntrepreneurPulse.com.au  Fact Sheet for Healthcare Providers: IncredibleEmployment.be  This test is not yet approved or cleared by the [place] FDA and has been  authorized for detection and/or diagnosis of SARS-CoV-2 by FDA under an Emergency Use Authorization (EUA). This EUA will remain in effect (meaning this test can be used) for the duration of the COVID-19 declaration under Section 564(b)(1) of the Act, 21 U.S.C. section 360bbb-3(b)(1), unless the authorization is terminated or revoked.  Performed at [facility], [place] [address] [place], [place] [address]          Radiology Studies: [facility] Chest Port 1 View  Result Date: 09/07/2020 CLINICAL DATA:  Questionable sepsis - evaluate for abnormality EXAM: PORTABLE CHEST 1 VIEW COMPARISON:  Radiograph 07/05/2012, chest CT 06/07/2019 FINDINGS: Unchanged cardiomediastinal silhouette. Aortic arch calcifications. There is no focal airspace disease. There is no large pleural effusion or visible pneumothorax. There is no acute osseous abnormality. Dextroconvex curvature of the spine. IMPRESSION: No focal airspace consolidation. Electronically Signed   By: Maurine Simmering   On: 09/07/2020 22:01        Scheduled Meds:  Chlorhexidine Gluconate Cloth  6 each Topical Daily   potassium chloride  30 mEq Oral Q3H   Continuous Infusions:  cefTRIAXone (ROCEPHIN)  IV     lactated ringers 75 mL/hr at 09/08/20 0009     LOS: 1 day    Time spent: 45 minutes spent on chart review, discussion with nursing staff, consultants, updating family and interview/physical exam; more than 50% of that time was spent in counseling and/or coordination of care.    Coumba Kellison J [place] DO [facility] Available via Epic secure chat 7am-7pm After these hours, please refer to coverage provider listed on [email or web address] 09/08/2020, 11:21 AM

## 2020-09-08 NOTE — ED Notes (Signed)
Patient moved to a hospital bed.

## 2020-09-08 NOTE — H&P (Signed)
History and Physical    Michelle [name] [id number] DOB: [birth year] DOA: 09/07/2020  PCP: [name], MD  Patient coming from: Home  I have personally briefly reviewed patient's old medical records in [place]  Chief Complaint: Weakness  HPI: Michelle [name] is a 87 y.o. female with medical history significant of breast CA on ibrance, HTN.  Pt lives at home.  Pt presents to ED feeling unwell for past week.  Bed bound for past couple of days.  Pt with body aches, chest wall pain, cough, nausea, dysuria.  Symptoms are constant and persistent.  Nothing makes better or worse.  No fevers, vomiting, diarrhea, abd pain.   ED Course: Pt with Tm 101.9, HR 102, BP 109/65.  Lactate and WBC nl  HGB 9.2 (unchanged since may).  Platelets of 31 (thrombocytopenia is new).   Review of Systems: As per HPI, otherwise all review of systems negative.  Past Medical History:  Diagnosis Date   Arthritis    Cancer ([place])    Bilaterial Breast Cancer   Dyspnea    GERD (gastroesophageal reflux disease)    Hypertension    Stroke ([place])    "mini stroke" per patient    Past Surgical History:  Procedure Laterality Date   ABDOMINAL HYSTERECTOMY     APPENDECTOMY     SHOULDER SURGERY     Right     reports that she has never smoked. She has never used smokeless tobacco. She reports that she does not drink alcohol and does not use drugs.  No Active Allergies  Family History  Adopted: Yes     Prior to Admission medications   Medication Sig Start Date End Date Taking? Authorizing Provider  acetaminophen (TYLENOL) 325 MG tablet Take 325-650 mg by mouth See admin instructions. Take 650 mg by mouth in the morning and an additional 325 mg in the evening    [provider]  anastrozole (ARIMIDEX) 1 MG tablet Take 1 tablet (1 mg total) by mouth daily. 06/26/20   Magrinat, Virgie Dad, MD  Calcium Carb-Cholecalciferol (CALCIUM 600+D3) 600-800 MG-UNIT TABS Take 2 tablets by mouth  in the morning.    [provider]  diphenhydrAMINE (BENADRYL) 12.5 MG/5ML liquid Take 12.5-25 mg by mouth 4 (four) times daily as needed for allergies.    [provider]  esomeprazole (NEXIUM) 20 MG capsule Take 20 mg by mouth daily as needed (acid reflux).    [provider]  gabapentin (NEURONTIN) 100 MG capsule Take 100 mg by mouth 3 (three) times daily. 01/01/20   [provider]  losartan-hydrochlorothiazide (HYZAAR) 100-12.5 MG per tablet Take 1 tablet by mouth in the morning.    [provider]  palbociclib (IBRANCE) 100 MG tablet TAKE 1 TABLET (100 MG TOTAL) BY MOUTH DAILY. TAKE FOR 21 DAYS ON, 7 DAYS OFF, REPEAT EVERY 28 DAYS. 02/25/20 02/24/21  Magrinat, Virgie Dad, MD  pravastatin (PRAVACHOL) 20 MG tablet Take 20 mg by mouth daily.    [provider]  triamcinolone ointment (KENALOG) 0.5 % Apply 1 application topically 2 (two) times daily. 12/27/19   Gardenia Phlegm, NP    Physical Exam: Vitals:   09/07/20 2238 09/07/20 2300 09/07/20 2330 09/08/20 0000  BP:  (!) 147/72 129/68 109/65  Pulse:  92 93 97  Resp:  (!) 23 (!) 28 20  Temp: (!) 101.9 F (38.8 C)     TempSrc: Rectal     SpO2:  99% 99% 97%  Weight:  Height:        Constitutional: Elderly, Frail Eyes: PERRL, lids and conjunctivae normal ENMT: Mucous membranes are moist. Posterior pharynx clear of any exudate or lesions.Normal dentition.  Neck: normal, supple, no masses, no thyromegaly Respiratory: clear to auscultation bilaterally, no wheezing, no crackles. Normal respiratory effort. No accessory muscle use.  Cardiovascular: Regular rate and rhythm, no murmurs / rubs / gallops. No extremity edema. 2+ pedal pulses. No carotid bruits.  Abdomen: Mild generalized TTP Musculoskeletal: no clubbing / cyanosis. No joint deformity upper and lower extremities. Good ROM, no contractures. Normal muscle tone.  Skin: no rashes, lesions, ulcers. No  induration Neurologic: Generalized weakness, no obvious focal deficit Psychiatric: Normal judgment and insight. Alert and oriented x 3. Normal mood.    Labs on Admission: I have personally reviewed following labs and imaging studies  CBC: Recent Labs  Lab 09/07/20 2208 09/07/20 2325  WBC 6.2  --   NEUTROABS 5.3  --   HGB 9.2*  --   HCT 27.8*  --   MCV 101.1*  --   PLT 31* 28*   Basic Metabolic Panel: Recent Labs  Lab 09/07/20 2208  NA 135  K 3.4*  CL 95*  CO2 23  GLUCOSE 106*  BUN 21  CREATININE 0.91  CALCIUM 9.2   GFR: Estimated Creatinine Clearance: 30.7 mL/min (by C-G formula based on SCr of 0.91 mg/dL). Liver Function Tests: Recent Labs  Lab 09/07/20 2208  AST 29  ALT 21  ALKPHOS 67  BILITOT 1.5*  PROT 6.5  ALBUMIN 3.0*   No results for input(s): LIPASE, AMYLASE in the last 168 hours. No results for input(s): AMMONIA in the last 168 hours. Coagulation Profile: Recent Labs  Lab 09/07/20 2208 09/07/20 2325  INR 1.2 1.2   Cardiac Enzymes: No results for input(s): CKTOTAL, CKMB, CKMBINDEX, TROPONINI in the last 168 hours. BNP (last 3 results) No results for input(s): PROBNP in the last 8760 hours. HbA1C: No results for input(s): HGBA1C in the last 72 hours. CBG: No results for input(s): GLUCAP in the last 168 hours. Lipid Profile: No results for input(s): CHOL, HDL, LDLCALC, TRIG, CHOLHDL, LDLDIRECT in the last 72 hours. Thyroid Function Tests: No results for input(s): TSH, T4TOTAL, FREET4, T3FREE, THYROIDAB in the last 72 hours. Anemia Panel: No results for input(s): VITAMINB12, FOLATE, FERRITIN, TIBC, IRON, RETICCTPCT in the last 72 hours. Urine analysis:    Component Value Date/Time   COLORURINE YELLOW 09/07/2020 2240   APPEARANCEUR CLOUDY (A) 09/07/2020 2240   LABSPEC 1.016 09/07/2020 2240   PHURINE 6.0 09/07/2020 2240   GLUCOSEU NEGATIVE 09/07/2020 2240   HGBUR MODERATE (A) 09/07/2020 2240   BILIRUBINUR NEGATIVE 09/07/2020 2240    KETONESUR 80 (A) 09/07/2020 2240   PROTEINUR 30 (A) 09/07/2020 2240   UROBILINOGEN 0.2 07/05/2012 1610   NITRITE POSITIVE (A) 09/07/2020 2240   LEUKOCYTESUR LARGE (A) 09/07/2020 2240    Radiological Exams on Admission: DG Chest Port 1 View  Result Date: 09/07/2020 CLINICAL DATA:  Questionable sepsis - evaluate for abnormality EXAM: PORTABLE CHEST 1 VIEW COMPARISON:  Radiograph 07/05/2012, chest CT 06/07/2019 FINDINGS: Unchanged cardiomediastinal silhouette. Aortic arch calcifications. There is no focal airspace disease. There is no large pleural effusion or visible pneumothorax. There is no acute osseous abnormality. Dextroconvex curvature of the spine. IMPRESSION: No focal airspace consolidation. Electronically Signed   By: Maurine Simmering   On: 09/07/2020 22:01    EKG: Independently reviewed.  Assessment/Plan Principal Problem:   Sepsis secondary to UTI [facility]) Active  Problems:   Hypertension   Malignant neoplasm of upper-outer quadrant of left breast in female, estrogen receptor positive ([facility])   Thrombocytopenia ([facility])   Anemia    Sepsis secondary to UTI - Sepsis pathway 500cc LR bolus then LR at 75 Lactate nl and BP maintained Rocephin Cultures pending Repeat labs in AM Thrombocytopenia - DIC from sepsis vs side effect of Ibrance DIC panel Hold Ibrance Breast CA - Hold Ibrance (infection and thrombocytopenia) Cont anastrazole Message sent to oncology so she shows up on their list tomorrow Anemia - Follow CBC But appears to be chronic / baseline since at least April   DVT prophylaxis: SCDs (thrombocytopenia) Code Status: Full code for now (pt still independent at home doing ADLs) Family Communication: No family in room Disposition Plan: Home after sepsis and thrombocytopenia improved Consults called: Consult sent to onc so she shows up on list Admission status: Admit to inpatient  Severity of Illness: The appropriate patient status for this patient is INPATIENT.  Inpatient status is judged to be reasonable and necessary in order to provide the required intensity of service to ensure the patient's safety. The patient's presenting symptoms, physical exam findings, and initial radiographic and laboratory data in the context of their chronic comorbidities is felt to place them at high risk for further clinical deterioration. Furthermore, it is not anticipated that the patient will be medically stable for discharge from the hospital within 2 midnights of admission. The following factors support the patient status of inpatient.   IP status due to sepsis complicated by thrombocytopenia.  Concern for DIC vs reaction to Ibrance   * I certify that at the point of admission it is my clinical judgment that the patient will require inpatient hospital care spanning beyond 2 midnights from the point of admission due to high intensity of service, high risk for further deterioration and high frequency of surveillance required.*   Juwan Vences M. DO [facility]  How to contact the [facility] Attending or Consulting provider [place] or covering provider during after hours [place], for this patient?  Check the care team in [facility] and look for a) attending/consulting TRH provider listed and b) the [facility] team listed Log into [email or web address]  Amion Physician Scheduling and messaging for groups and whole hospitals  On call and physician scheduling software for group practices, residents, hospitalists and other medical providers for call, clinic, rotation and shift schedules. OnCall [employer] is a hospital-wide system for scheduling doctors and paging doctors on call. EasyPlot is for scientific plotting and data analysis.  [email or web address]  and use [place]'s universal password to access. If you do not have the password, please contact the hospital operator.  Locate the [facility] provider you are looking for under [facility] and page to a number that you can be directly reached. If  you still have difficulty reaching the provider, please page the [facility] (Director on Call) for the Hospitalists listed on amion for assistance.  09/08/2020, 12:42 AM

## 2020-09-08 NOTE — ED Notes (Signed)
Patient seems restless and agitated. Reoriented patient to where she was, fixed linens, and reassured I would come back to check on her.

## 2020-09-08 NOTE — ED Notes (Signed)
Patient given water to drink.  

## 2020-09-08 NOTE — Progress Notes (Addendum)
HEMATOLOGY-ONCOLOGY PROGRESS NOTE  SUBJECTIVE: Michelle [name] is followed by our office for breast cancer.  She is currently receiving neoadjuvant anastrozole and Ibrance.  She presented to the emergency department with weakness.  She was found to have a temp of one 1.9 in the emergency department.  Admitted for sepsis secondary to UTI.  Found to have thrombocytopenia on admission.  Her [facility] has been placed on hold.  I saw the patient this afternoon in her hospital room.  Her son and daughter were at the bedside.  She still complains of weakness.  No fevers documented since last evening.  Her daughter tells me that she had foul-smelling urine at home.  She has not had any hematuria.  No other bleeding reported.  Oncology History  Malignant neoplasm of upper-outer quadrant of left breast in female, estrogen receptor positive ([place])  05/17/2019 Cancer Staging   Staging form: Breast, AJCC 8th Edition - Clinical stage from 05/17/2019: Stage IIIA (cT3, cN1, cM0, G2, ER+, PR-, HER2-) - Signed by Gardenia Phlegm, NP on 05/29/2019    05/27/2019 Initial Diagnosis   Malignant neoplasm of upper-outer quadrant of left breast in female, estrogen receptor positive ([place])    Malignant neoplasm of overlapping sites of right breast in female, estrogen receptor positive ([place])  05/17/2019 Cancer Staging   Staging form: Breast, AJCC 8th Edition - Clinical stage from 05/17/2019: Stage IIA (cT2, cN0, cM0, G1, ER+, PR-, HER2-) - Signed by Gardenia Phlegm, NP on 05/29/2019    05/28/2019 Initial Diagnosis   Malignant neoplasm of overlapping sites of right breast in female, estrogen receptor positive ([place])      REVIEW OF SYSTEMS:   Noncontributory except as noted in the HPI.  Past Medical History:  Diagnosis Date   Arthritis    Cancer ([place])    Bilaterial Breast Cancer   Dyspnea    GERD (gastroesophageal reflux disease)    Hypertension    Stroke ([place])    "mini stroke" per patient   Past Surgical  History:  Procedure Laterality Date   ABDOMINAL HYSTERECTOMY     APPENDECTOMY     SHOULDER SURGERY     Right   Family History  Adopted: Yes    GYNECOLOGIC HISTORY:  No LMP recorded. Patient is postmenopausal. Menarche: Does not recall Age at first live birth: 87 years old [place] P 4 LMP 35s Hysterectomy?  Yes BSO?  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  Her children are [name], who lives in [place] and works for [employer] [name]) lives in [place] and runs his own business, and Michelle [name]") who lives in [place] and works for [employer]  He is also training his children to box.  Son [name] died from hepatitis C at age 73.  The patient has 11 grandchildren and more great grandchildren and great great-grandchildren than she can count.  She attends a local [place]: Not in place.  At the 05/28/2019 visit the patient was given the appropriate documents to complete and notarized at her discretion.  PHYSICAL EXAMINATION: ECOG PERFORMANCE STATUS: 2 - Symptomatic, <50% confined to bed  Vitals:   09/08/20 1200 09/08/20 1452  BP: 120/64 (!) 118/59  Pulse: 75 91  Resp: (!) 26 (!) 22  Temp: 98.7 F (37.1 C) 97.7 F (36.5 C)  SpO2: 100% 98%   Filed Weights   09/07/20 2156 09/08/20 0840  Weight: 47.2 kg 47.1 kg    Intake/Output from previous day: 07/25 0701 - 07/26 0700 In: 600 [IV Piggyback:600] Out: -   GENERAL: Chronically ill-appearing, tired, no distress. SKIN: skin color, texture, turgor are normal, no rashes or significant lesions EYES: normal, Conjunctiva are pink and non-injected, sclera clear OROPHARYNX:no exudate, no erythema and lips, buccal mucosa, and tongue normal  LUNGS: clear to auscultation and percussion with normal breathing effort HEART: regular rate & rhythm and no murmurs and no  lower extremity edema ABDOMEN:abdomen soft, non-tender and normal bowel sounds NEURO: alert & oriented x 3 with fluent speech, no focal motor/sensory deficits  LABORATORY DATA:  I have reviewed the data as listed CMP Latest Ref Rng & Units 09/08/2020 09/07/2020 05/27/2020  Glucose 70 - 99 mg/dL 107(H) 106(H) 91  BUN 8 - 23 mg/dL 20 21 26(H)  Creatinine 0.44 - 1.00 mg/dL 0.84 0.91 1.03(H)  Sodium 135 - 145 mmol/L 134(L) 135 146(H)  Potassium 3.5 - 5.1 mmol/L 3.3(L) 3.4(L) 3.7  Chloride 98 - 111 mmol/L 99 95(L) 105  CO2 22 - 32 mmol/L '25 23 28  ' Calcium 8.9 - 10.3 mg/dL 8.7(L) 9.2 9.8  Total Protein 6.5 - 8.1 g/dL 5.7(L) 6.5 7.1  Total Bilirubin 0.3 - 1.2 mg/dL 1.4(H) 1.5(H) 0.9  Alkaline Phos 38 - 126 U/L 61 67 38  AST 15 - 41 U/L '27 29 17  ' ALT 0 - 44 U/L '21 21 9    ' Lab Results  Component Value Date   WBC 6.0 09/08/2020   HGB 8.4 (L) 09/08/2020   HCT 24.5 (L) 09/08/2020   MCV 99.2 09/08/2020   PLT 22 (LL) 09/08/2020   NEUTROABS 5.3 09/07/2020    DG Chest Port 1 View  Result Date: 09/07/2020 CLINICAL DATA:  Questionable sepsis - evaluate for abnormality EXAM: PORTABLE CHEST 1 VIEW COMPARISON:  Radiograph 07/05/2012, chest CT 06/07/2019 FINDINGS: Unchanged cardiomediastinal silhouette. Aortic arch calcifications. There is no focal airspace disease. There is no large pleural effusion or visible pneumothorax. There is no acute osseous abnormality. Dextroconvex curvature of the spine. IMPRESSION: No focal airspace consolidation. Electronically Signed   By: Maurine Simmering   On: 09/07/2020 22:01    ASSESSMENT: 87 y.o. [place] woman status post bilateral breast biopsies on 05/17/2019 as follows:             (a) on the left upper outer quadrant, a clinical T2 N2, stage IIIA invasive ductal carcinoma,grade 2 or 3, estrogen receptor positive, progesterone receptor negative, with no HER-2 amplification and an MIB-1 of 12%             (b) on the right overlapping sites, 2 morphologically distinct  invasive ductal carcinomas, both clinically T1c N0, stage IA, grade 1 or 2, estrogen receptor positive, progesterone receptor negative, with no HER-2 amplification and an MIB-1 of 5%             (c) CT of the chest abdomen and pelvis 06/10/2019 shows left axillary, retropectoral and supraclavicular lymph nodes, and a 4.1 cm left breast mass; no right axillary adenopathy; 0.6 cm nonspecific left lower lobe pulmonary nodule; no liver or bone lesions.             (d) bone scan 06/26/2019--no evidence of skeletal metastases                          (  1) neoadjuvant anastrozole started 05/28/2019, palbociclib added 05/30/2019 125 mg a day, 21 on 7 off             (a) palbociclib dose decreased cycle 2 (May 2021) to 100 mg daily, 21/7             (b) bilateral breast and axilla ultrasonography 10/04/2019 shows a good initial response to treatment             (c) repeat bilateral ultrasonography 046 2022 continues to show evidence of response   (2) genetics testing declined   (3) definitive surgery pending   (4) adjuvant radiation to follow  PLAN: Michelle [name] has been admitted for sepsis secondary to UTI.  She is on a course of IV antibiotics.  Blood and urine cultures are pending and recommend following up on results and adjusting antibiotics pending sensitivities.  She was noted to have thrombocytopenia on admission.  Her prior platelet count in our office 3 months ago was completely normal.  Her thrombocytopenia is likely related to sepsis along with taking Ibrance.  Recommend holding Ibrance at this time.  Monitor platelet count closely and transfuse platelets for platelet count less than 20,000 or active bleeding.   LOS: 1 day   Mikey Bussing, DNP, AGPCNP-BC, AOCNP 09/08/20   ADDENDUM: Masiel has been on her current dose of palbociclib since May 2021 without evidence of thrombocytopenia. Accordingly I am concerned that her current low platelet count may be related to bleeding, ITP, DIC [but she has  normal PT and PTT] or other causes. This evening she is clinically stable, denies SOB at rest (but tells me she is short winded when she tries to do anything) and denies any overt bleeding. Her urosepsis is being treated and I am hopeful for a quick turnaround. Added a type and screen to AM labs. Discussed with patient and family.  Appreciate the care Ms Fayette is receiving. Will follow with you.   I personally saw this patient and performed a substantive portion of this encounter with the listed APP documented above.   Chauncey Cruel, MD Medical Oncology and Hematology [facility] [address] [place], [place] [address] Tel. [phone]    Fax. [phone]

## 2020-09-09 DIAGNOSIS — A419 Sepsis, unspecified organism: Secondary | ICD-10-CM | POA: Diagnosis not present

## 2020-09-09 DIAGNOSIS — N390 Urinary tract infection, site not specified: Secondary | ICD-10-CM | POA: Diagnosis not present

## 2020-09-09 LAB — CBC WITH DIFFERENTIAL/PLATELET
Abs Immature Granulocytes: 0.08 10*3/uL — ABNORMAL HIGH (ref 0.00–0.07)
Basophils Absolute: 0 10*3/uL (ref 0.0–0.1)
Basophils Relative: 0 %
Eosinophils Absolute: 0 10*3/uL (ref 0.0–0.5)
Eosinophils Relative: 0 %
HCT: 21.9 % — ABNORMAL LOW (ref 36.0–46.0)
Hemoglobin: 7.5 g/dL — ABNORMAL LOW (ref 12.0–15.0)
Immature Granulocytes: 2 %
Lymphocytes Relative: 6 %
Lymphs Abs: 0.3 10*3/uL — ABNORMAL LOW (ref 0.7–4.0)
MCH: 33.5 pg (ref 26.0–34.0)
MCHC: 34.2 g/dL (ref 30.0–36.0)
MCV: 97.8 fL (ref 80.0–100.0)
Monocytes Absolute: 0.3 10*3/uL (ref 0.1–1.0)
Monocytes Relative: 6 %
Neutro Abs: 4.4 10*3/uL (ref 1.7–7.7)
Neutrophils Relative %: 86 %
Platelets: 28 10*3/uL — CL (ref 150–400)
RBC: 2.24 MIL/uL — ABNORMAL LOW (ref 3.87–5.11)
RDW: 13.5 % (ref 11.5–15.5)
WBC: 5.1 10*3/uL (ref 4.0–10.5)
nRBC: 0 % (ref 0.0–0.2)

## 2020-09-09 LAB — CBC
HCT: 21.5 % — ABNORMAL LOW (ref 36.0–46.0)
Hemoglobin: 7.3 g/dL — ABNORMAL LOW (ref 12.0–15.0)
MCH: 33.2 pg (ref 26.0–34.0)
MCHC: 34 g/dL (ref 30.0–36.0)
MCV: 97.7 fL (ref 80.0–100.0)
Platelets: 23 10*3/uL — CL (ref 150–400)
RBC: 2.2 MIL/uL — ABNORMAL LOW (ref 3.87–5.11)
RDW: 13.5 % (ref 11.5–15.5)
WBC: 5.9 10*3/uL (ref 4.0–10.5)
nRBC: 0 % (ref 0.0–0.2)

## 2020-09-09 LAB — RETICULOCYTES
Immature Retic Fract: 12.5 % (ref 2.3–15.9)
RBC.: 2.18 MIL/uL — ABNORMAL LOW (ref 3.87–5.11)
Retic Count, Absolute: 11.3 10*3/uL — ABNORMAL LOW (ref 19.0–186.0)
Retic Ct Pct: 0.5 % (ref 0.4–3.1)

## 2020-09-09 LAB — BLOOD CULTURE ID PANEL (REFLEXED) - BCID2

## 2020-09-09 LAB — COMPREHENSIVE METABOLIC PANEL
ALT: 18 U/L (ref 0–44)
AST: 26 U/L (ref 15–41)
Albumin: 2.4 g/dL — ABNORMAL LOW (ref 3.5–5.0)
Alkaline Phosphatase: 57 U/L (ref 38–126)
Anion gap: 10 (ref 5–15)
BUN: 16 mg/dL (ref 8–23)
CO2: 27 mmol/L (ref 22–32)
Calcium: 8.6 mg/dL — ABNORMAL LOW (ref 8.9–10.3)
Chloride: 99 mmol/L (ref 98–111)
Creatinine, Ser: 0.65 mg/dL (ref 0.44–1.00)
GFR, Estimated: >60 mL/min (ref >60)
Glucose, Bld: 102 mg/dL — ABNORMAL HIGH (ref 70–99)
Potassium: 3.8 mmol/L (ref 3.5–5.1)
Sodium: 136 mmol/L (ref 135–145)
Total Bilirubin: 0.9 mg/dL (ref 0.3–1.2)
Total Protein: 5 g/dL — ABNORMAL LOW (ref 6.5–8.1)

## 2020-09-09 LAB — BILIRUBIN, FRACTIONATED(TOT/DIR/INDIR)
Bilirubin, Direct: 0.2 mg/dL (ref 0.0–0.2)
Indirect Bilirubin: 0.8 mg/dL (ref 0.3–0.9)
Total Bilirubin: 1 mg/dL (ref 0.3–1.2)

## 2020-09-09 LAB — URINE CULTURE

## 2020-09-09 LAB — PREPARE RBC (CROSSMATCH)

## 2020-09-09 LAB — ABO/RH: ABO/RH(D): O POS

## 2020-09-09 LAB — MAGNESIUM: Magnesium: 1.8 mg/dL (ref 1.7–2.4)

## 2020-09-09 MED ORDER — HEPARIN SOD (PORK) LOCK FLUSH 100 UNIT/ML IV SOLN: 250.0000 [IU] | INTRAVENOUS | Status: DC | PRN

## 2020-09-09 MED ORDER — SODIUM CHLORIDE 0.9% IV SOLUTION: Freq: Once | INTRAVENOUS | Status: AC

## 2020-09-09 MED ORDER — SODIUM CHLORIDE 0.9% FLUSH: 3.0000 mL | INTRAVENOUS | Status: DC | PRN

## 2020-09-09 MED ORDER — ACETAMINOPHEN 325 MG PO TABS: 650.0000 mg | ORAL_TABLET | Freq: Once | ORAL | Status: DC

## 2020-09-09 MED ORDER — DIPHENHYDRAMINE HCL 25 MG PO CAPS
25.0000 mg | ORAL_CAPSULE | Freq: Once | ORAL | Status: AC
  Administered 2020-09-09: 25 mg via ORAL
  Filled 2020-09-09: qty 1

## 2020-09-09 MED ORDER — SODIUM CHLORIDE 0.9% IV SOLUTION: 250.0000 mL | Freq: Once | INTRAVENOUS | Status: DC

## 2020-09-09 MED ORDER — DIPHENHYDRAMINE HCL 25 MG PO CAPS: 25.0000 mg | ORAL_CAPSULE | Freq: Once | ORAL | Status: DC

## 2020-09-09 MED ORDER — METHYLPREDNISOLONE SODIUM SUCC 40 MG IJ SOLR
40.0000 mg | Freq: Once | INTRAMUSCULAR | Status: AC
  Administered 2020-09-09: 40 mg via INTRAVENOUS
  Filled 2020-09-09: qty 1

## 2020-09-09 MED ORDER — ENSURE ENLIVE PO LIQD
237.0000 mL | Freq: Two times a day (BID) | ORAL | Status: DC
  Administered 2020-09-09 – 2020-09-16 (×10): 237 mL via ORAL

## 2020-09-09 MED ORDER — ACETAMINOPHEN 325 MG PO TABS
650.0000 mg | ORAL_TABLET | Freq: Once | ORAL | Status: AC
  Administered 2020-09-09: 650 mg via ORAL
  Filled 2020-09-09: qty 2

## 2020-09-09 MED ORDER — SODIUM CHLORIDE 0.9 % IV SOLN: 2.0000 g | INTRAVENOUS | Status: DC

## 2020-09-09 MED ORDER — ADULT MULTIVITAMIN W/MINERALS CH
1.0000 | ORAL_TABLET | Freq: Every day | ORAL | Status: DC
  Administered 2020-09-09 – 2020-09-16 (×7): 1 via ORAL
  Filled 2020-09-09 (×7): qty 1

## 2020-09-09 MED ORDER — SODIUM CHLORIDE 0.9 % IV SOLN
2.0000 g | INTRAVENOUS | Status: DC
  Administered 2020-09-09 – 2020-09-10 (×2): 2 g via INTRAVENOUS
  Filled 2020-09-09: qty 2
  Filled 2020-09-09 (×2): qty 20

## 2020-09-09 NOTE — Progress Notes (Signed)
Initial Nutrition Assessment  INTERVENTION:   -Ensure Enlive po BID, each supplement provides 350 kcal and 20 grams of protein  -Multivitamin with minerals daily  NUTRITION DIAGNOSIS:   Increased nutrient needs related to acute illness as evidenced by estimated needs.  GOAL:   Patient will meet greater than or equal to 90% of their needs  MONITOR:   PO intake, Supplement acceptance, Labs, Weight trends, I & O's  REASON FOR ASSESSMENT:   Consult Assessment of nutrition requirement/status  ASSESSMENT:   87 year old female with past medical history significant for breast cancer on Ibrance, essential hypertension who presented to [facility] ED on 7/25 with progressive weakness and burning with urination.  She reports feeling unwell for the last week and now bedbound for the last few days with associated body aches, chest wall pain, cough, nausea and dysuria.  Patient in room, sleeping in bed. Did not waken when spoken to. Pt's daughter at bedside. States she ate some breakfast this morning of fruit, milk, juice and a bit of oatmeal. Pt has not been eating well this past week d/t feeling poorly. Was not wanting to drink her Ensure at home. Pt's daughter states she thinks she would drink them now. Will order, only like vanilla and strawberry flavors.   Per weight records, pt has lost  18 lbs since 11/01/19 (14% wt loss x 10 months, insignificant for time frame).   Medications reviewed.  Labs reviewed.  NUTRITION - FOCUSED PHYSICAL EXAM:  Deferred, family didn't want her disturbed.  Diet Order:   Diet Order             Diet Heart Room service appropriate? Yes; Fluid consistency: Thin  Diet effective now                   EDUCATION NEEDS:   No education needs have been identified at this time  Skin:  Skin Assessment: Reviewed RN Assessment  Last BM:  PTA  Height:   Ht Readings from Last 1 Encounters:  09/08/20 '5\' 1"'$  (1.549 m)    Weight:   Wt Readings from Last 1  Encounters:  09/08/20 47.1 kg    BMI:  Body mass index is 19.62 kg/m.  Estimated Nutritional Needs:   Kcal:  1450-1650  Protein:  65-75g  Fluid:  1.7L/day   Clayton Bibles, MS, RD, LDN Inpatient Clinical Dietitian Contact information available via Amion

## 2020-09-09 NOTE — Progress Notes (Signed)
PHARMACY - PHYSICIAN COMMUNICATION CRITICAL VALUE ALERT - BLOOD CULTURE IDENTIFICATION (BCID)  Michelle [name] is an 87 y.o. female with breast cancer on ibrance PTA presented to [facility] on 09/07/2020 with a chief complaint of generalize weakness and breast pain. Two of four blood culture bottles collected on 7/25 have GPC in clusters (nothing on BCID).    Name of physician (or Provider) Contacted: Dr. [place]  Current antibiotics: ceftriaxone.  Changes to prescribed antibiotics recommended:  - hold off on adding additional abx since pt is afebrile - continue with ceftriaxone for now. Will increase dose to 2gm q24h  Results for orders placed or performed during the hospital encounter of 09/07/20  Blood Culture ID Panel (Reflexed) (Collected: 09/07/2020 10:08 PM)  Result Value Ref Range   Enterococcus faecalis NOT DETECTED NOT DETECTED   Enterococcus Faecium NOT DETECTED NOT DETECTED   Listeria monocytogenes NOT DETECTED NOT DETECTED   Staphylococcus species NOT DETECTED NOT DETECTED   Staphylococcus aureus (BCID) NOT DETECTED NOT DETECTED   Staphylococcus epidermidis NOT DETECTED NOT DETECTED   Staphylococcus lugdunensis NOT DETECTED NOT DETECTED   Streptococcus species NOT DETECTED NOT DETECTED   Streptococcus agalactiae NOT DETECTED NOT DETECTED   Streptococcus pneumoniae NOT DETECTED NOT DETECTED   Streptococcus pyogenes NOT DETECTED NOT DETECTED   A.calcoaceticus-baumannii NOT DETECTED NOT DETECTED   Bacteroides fragilis NOT DETECTED NOT DETECTED   Enterobacterales NOT DETECTED NOT DETECTED   Enterobacter cloacae complex NOT DETECTED NOT DETECTED   Escherichia coli NOT DETECTED NOT DETECTED   Klebsiella aerogenes NOT DETECTED NOT DETECTED   Klebsiella oxytoca NOT DETECTED NOT DETECTED   Klebsiella pneumoniae NOT DETECTED NOT DETECTED   Proteus species NOT DETECTED NOT DETECTED   Salmonella species NOT DETECTED NOT DETECTED   Serratia marcescens NOT DETECTED NOT DETECTED    Haemophilus influenzae NOT DETECTED NOT DETECTED   Neisseria meningitidis NOT DETECTED NOT DETECTED   Pseudomonas aeruginosa NOT DETECTED NOT DETECTED   Stenotrophomonas maltophilia NOT DETECTED NOT DETECTED   Candida albicans NOT DETECTED NOT DETECTED   Candida auris NOT DETECTED NOT DETECTED   Candida glabrata NOT DETECTED NOT DETECTED   Candida krusei NOT DETECTED NOT DETECTED   Candida parapsilosis NOT DETECTED NOT DETECTED   Candida tropicalis NOT DETECTED NOT DETECTED   Cryptococcus neoformans/gattii NOT DETECTED NOT DETECTED    Lynelle Doctor 09/09/2020  8:42 AM

## 2020-09-09 NOTE — Evaluation (Signed)
Occupational Therapy Evaluation Patient Details Name: Michelle [name] MRN: [id number] DOB: 27-May-1930 Today's Date: 09/09/2020    History of Present Illness patient is a 87 year old female who presented to the hospital on 7/26 with shortness of breath and breast pain. patietn was admitted for sepsis secondary to urinary tract infection and thrombocytopenia. [id number] cancer and HTN   Clinical Impression   Patient is an 87 year old female who was admitted for above diagnosis. Patient was living at home alone PLOF per daughter report. Currently patient is mod A for supine to sit on edge of bed with increased time and cues for sequencing. Patient was mod A for self feeding tasks while sitting on edge of bed with daughter education on learned helplessness and strategies to avoid it. Patients daughter verbalized understanding. Patient was noted to have decreased activity tolerance, decreased endurance, decreased standing balance and tolerance impacting participation in ADLs. Patient would continue to benefit from skilled OT services at this time while admitted and after d/c to address noted deficits in order to improve overall safety and independence in ADLs.       Follow Up Recommendations  SNF (if family refuses 24/7 care and [facility] services)    Equipment Recommendations  Other (comment) (will follow up)    Recommendations for Other Services       Precautions / Restrictions Precautions Precautions: None Restrictions Weight Bearing Restrictions: No      Mobility Bed Mobility Overal bed mobility: Needs Assistance Bed Mobility: Supine to Sit     Supine to sit: Mod assist     General bed mobility comments: with increased time and cues for proper positioning and sequencing.    Transfers                 General transfer comment: unable to transfer with patient declining standing or transfers on this date.    Balance Overall balance assessment: Needs assistance Sitting-balance  support: Feet supported Sitting balance-Leahy Scale: Fair Sitting balance - Comments: sitting on edge of bed eating apple sauce                                   ADL either performed or assessed with clinical judgement   ADL Overall ADL's : Needs assistance/impaired Eating/Feeding: Moderate assistance;Sitting Eating/Feeding Details (indicate cue type and reason): patient participated in breakfast sitting on edge of bed with patient declinging to get into recliner or sit with bed behind back for support. patient's daughter was educated on learned helpessness and allowinig patient to participate in self feeding first prior to offering feeding assistance if needed. patients daughter verbalized understanding but was still offering bites of food from fork often during session. Grooming: Sitting;Wash/dry face;Set up   Upper Body Bathing: Moderate assistance;Bed level   Lower Body Bathing: Bed level;Maximal assistance   Upper Body Dressing : Maximal assistance;Bed level   Lower Body Dressing: Bed level;Maximal assistance     Toilet Transfer Details (indicate cue type and reason): patient declined to participate in standing or transfers on this date. Toileting- Clothing Manipulation and Hygiene: Total assistance               Vision   Additional Comments: unable to formally assess with patient reporting she was busy while eating.     Perception     Praxis      Pertinent Vitals/Pain Pain Assessment: Faces Faces Pain Scale: Hurts little more Pain  Location: all over Pain Descriptors / Indicators: Grimacing Pain Intervention(s): Limited activity within patient's tolerance;Monitored during session     Hand Dominance Right   Extremity/Trunk Assessment Upper Extremity Assessment Upper Extremity Assessment: Overall WFL for tasks assessed (patient was able to use BUE for supine to sit and self feeding but did nto want to aprticipate in MMT or ROM testing)   Lower  Extremity Assessment Lower Extremity Assessment: Defer to PT evaluation   Cervical / Trunk Assessment Cervical / Trunk Assessment: Kyphotic   Communication Communication Communication: No difficulties   Cognition Arousal/Alertness: Awake/alert Behavior During Therapy: Flat affect Overall Cognitive Status: Difficult to assess                                 General Comments: patient was not open to answering very many questions. patients daughter was able to provied info for patient.   General Comments       Exercises     Shoulder Instructions      Home Living Family/patient expects to be discharged to:: Private residence Living Arrangements: Alone Available Help at Discharge: Family;Friend(s);Available 24 hours/day Type of Home: House Home Access: Stairs to enter [employer] of Steps: 3 Entrance Stairs-Rails: Right;Left Home Layout: One level     Bathroom Shower/Tub: Teacher, early years/pre: Standard Bathroom Accessibility: Yes   Home Equipment: None          Prior Functioning/Environment Level of Independence: Independent                 OT Problem List: Decreased strength;Impaired balance (sitting and/or standing);Decreased range of motion;Decreased activity tolerance;Decreased coordination;Impaired UE functional use;Decreased safety awareness;Pain      OT Treatment/Interventions: Self-care/ADL training;DME and/or AE instruction;Therapeutic activities;Balance training;Therapeutic exercise;Patient/family education    OT Goals(Current goals can be found in the care plan section) Acute Rehab OT Goals Patient Stated Goal: to eat my breakfast OT Goal Formulation: With patient/family Time For Goal Achievement: 09/23/20 Potential to Achieve Goals: Fair  OT Frequency: Min 2X/week   Barriers to D/C:    patient lives at home alone per daughter report       Co-evaluation              AM-PAC OT "6 Clicks" Daily  Activity     Outcome Measure Help from another person eating meals?: A Little Help from another person taking care of personal grooming?: A Little Help from another person toileting, which includes using toliet, bedpan, or urinal?: Total Help from another person bathing (including washing, rinsing, drying)?: Total Help from another person to put on and taking off regular upper body clothing?: Total Help from another person to put on and taking off regular lower body clothing?: Total 6 Click Score: 10   End of Session Nurse Communication: Other (comment) (cleared for participation)  Activity Tolerance: Patient limited by fatigue Patient left: in bed;with bed alarm set  OT Visit Diagnosis: Muscle weakness (generalized) (M62.81);Pain                Time: [id number] OT Time Calculation (min): 25 min Charges:  OT General Charges $OT Visit: 1 Visit OT Evaluation $OT Eval Low Complexity: 1 Low OT Treatments $Self Care/Home Management : 8-22 mins  Jackelyn Poling OTR/L, MS Acute Rehabilitation Department Office# [phone] Pager# [phone]   [place] 09/09/2020, 1:40 PM

## 2020-09-09 NOTE — Progress Notes (Signed)
HEMATOLOGY-ONCOLOGY PROGRESS NOTE  SUBJECTIVE: Michelle [name]  had an uneventful night. She feels weak this AM. Denies pain, cough, SOB, pleurisy. No family in room  Oncology History  Malignant neoplasm of upper-outer quadrant of left breast in female, estrogen receptor positive ([place])  05/17/2019 Cancer Staging   Staging form: Breast, AJCC 8th Edition - Clinical stage from 05/17/2019: Stage IIIA (cT3, cN1, cM0, G2, ER+, PR-, HER2-) - Signed by Gardenia Phlegm, NP on 05/29/2019    05/27/2019 Initial Diagnosis   Malignant neoplasm of upper-outer quadrant of left breast in female, estrogen receptor positive ([place])    Malignant neoplasm of overlapping sites of right breast in female, estrogen receptor positive ([place])  05/17/2019 Cancer Staging   Staging form: Breast, AJCC 8th Edition - Clinical stage from 05/17/2019: Stage IIA (cT2, cN0, cM0, G1, ER+, PR-, HER2-) - Signed by Gardenia Phlegm, NP on 05/29/2019    05/28/2019 Initial Diagnosis   Malignant neoplasm of overlapping sites of right breast in female, estrogen receptor positive ([place])      REVIEW OF SYSTEMS:   Noncontributory except as noted in the HPI.  Past Medical History:  Diagnosis Date   Arthritis    Cancer ([place])    Bilaterial Breast Cancer   Dyspnea    GERD (gastroesophageal reflux disease)    Hypertension    Stroke ([place])    "mini stroke" per patient   Past Surgical History:  Procedure Laterality Date   ABDOMINAL HYSTERECTOMY     APPENDECTOMY     SHOULDER SURGERY     Right   Family History  Adopted: Yes    GYNECOLOGIC HISTORY:  No LMP recorded. Patient is postmenopausal. Menarche: Does not recall Age at first live birth: 87 years old [place] P 4 LMP 103s Hysterectomy?  Yes BSO?  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  Her children are [name], who lives in [place] and works  for [name]) lives in [place] and runs his own business, and [name]") who lives in [place] and works for [employer]  He is also training his children to box.  Son [name] died from hepatitis C at age 74.  The patient has 11 grandchildren and more great grandchildren and great great-grandchildren than she can count.  She attends a local [employer]: Not in place.  At the 05/28/2019 visit the patient was given the appropriate documents to complete and notarized at her discretion.  PHYSICAL EXAMINATION: ECOG PERFORMANCE STATUS: 2 - Symptomatic, <50% confined to bed  Vitals:   09/09/20 0316 09/09/20 0531  BP: (!) 127/59 (!) 144/66  Pulse: 86 82  Resp: 18 18  Temp: 98.9 F (37.2 C) 97.7 F (36.5 C)  SpO2: 99% 100%   Filed Weights   09/07/20 2156 09/08/20 0840  Weight: 104 lb (47.2 kg) 103 lb 13.4 oz (47.1 kg)    Intake/Output from previous day: 07/26 0701 - 07/27 0700 In: 966.6 [P.O.:240; I.V.:626.6; IV Piggyback:100] Out: 550 [Urine:550]    LABORATORY DATA:  I have reviewed the data as listed CMP Latest Ref Rng & Units 09/09/2020 09/09/2020 09/08/2020  Glucose 70 - 99 mg/dL - 102(H) 107(H)  BUN 8 - 23 mg/dL - 16  20  Creatinine 0.44 - 1.00 mg/dL - 0.65 0.84  Sodium 135 - 145 mmol/L - 136 134(L)  Potassium 3.5 - 5.1 mmol/L - 3.8 3.3(L)  Chloride 98 - 111 mmol/L - 99 99  CO2 22 - 32 mmol/L - 27 25  Calcium 8.9 - 10.3 mg/dL - 8.6(L) 8.7(L)  Total Protein 6.5 - 8.1 g/dL - 5.0(L) 5.7(L)  Total Bilirubin 0.3 - 1.2 mg/dL 1.0 0.9 1.4(H)  Alkaline Phos 38 - 126 U/L - 57 61  AST 15 - 41 U/L - 26 27  ALT 0 - 44 U/L - 18 21    Lab Results  Component Value Date   WBC 5.9 09/09/2020   HGB 7.3 (L) 09/09/2020   HCT 21.5 (L) 09/09/2020   MCV 97.7 09/09/2020   PLT 23 (LL) 09/09/2020   NEUTROABS 5.3 09/07/2020    DG Chest Port 1 View  Result Date: 09/07/2020 CLINICAL DATA:  Questionable sepsis - evaluate for abnormality EXAM:  PORTABLE CHEST 1 VIEW COMPARISON:  Radiograph 07/05/2012, chest CT 06/07/2019 FINDINGS: Unchanged cardiomediastinal silhouette. Aortic arch calcifications. There is no focal airspace disease. There is no large pleural effusion or visible pneumothorax. There is no acute osseous abnormality. Dextroconvex curvature of the spine. IMPRESSION: No focal airspace consolidation. Electronically Signed   By: Maurine Simmering   On: 09/07/2020 22:01    ASSESSMENT:  A: Breast Cancer history:  87 y.o. [place] woman status post bilateral breast biopsies on 05/17/2019 as follows:             (a) on the left upper outer quadrant, a clinical T2 N2, stage IIIA invasive ductal carcinoma,grade 2 or 3, estrogen receptor positive, progesterone receptor negative, with no HER-2 amplification and an MIB-1 of 12%             (b) on the right overlapping sites, 2 morphologically distinct invasive ductal carcinomas, both clinically T1c N0, stage IA, grade 1 or 2, estrogen receptor positive, progesterone receptor negative, with no HER-2 amplification and an MIB-1 of 5%             (c) CT of the chest abdomen and pelvis 06/10/2019 shows left axillary, retropectoral and supraclavicular lymph nodes, and a 4.1 cm left breast mass; no right axillary adenopathy; 0.6 cm nonspecific left lower lobe pulmonary nodule; no liver or bone lesions.             (d) bone scan 06/26/2019--no evidence of skeletal metastases                          (1) neoadjuvant anastrozole started 05/28/2019, palbociclib added 05/30/2019 125 mg a day, 21 on 7 off             (a) palbociclib dose decreased cycle 2 (May 2021) to 100 mg daily, 21/7             (b) bilateral breast and axilla ultrasonography 10/04/2019 shows a good initial response to treatment             (c) repeat bilateral ultrasonography 046 2022 continues to show evidence of response   (2) genetics testing declined   (3) definitive surgery pending   (4) adjuvant radiation to follow  B:  severe thrombocytopenia: TTP? Review of blood film does show some schistocytes but they are rare (<1/HPF); the PLASMIC score suggests low/intermediate likely hood of TTP. It is difficult to separate sepsis/ TTP. Given all the above, I will not proceed to  plasma exchange but have written for her to receive FFP today. She also needs transfusion and I have written for 2 untis PRBCs. I will repeat a CBC this evening.  Will follow with you   PLAN: Michelle [name] has been admitted for sepsis secondary to UTI.  She is on a course of IV antibiotics.  Blood and urine cultures are pending and recommend following up on results and adjusting antibiotics pending sensitivities.  She was noted to have thrombocytopenia on admission.  Her prior platelet count in our office 3 months ago was completely normal.  Her thrombocytopenia is likely related to sepsis along with taking Ibrance.  Recommend holding Ibrance at this time.  Monitor platelet count closely and transfuse platelets for platelet count less than 20,000 or active bleeding.   LOS: 2 days   Chauncey Cruel, DNP, AGPCNP-BC, AOCNP 09/09/20   ADDENDUM: Michelle [name] has been on her current dose of palbociclib since May 2021 without evidence of thrombocytopenia. Accordingly I am concerned that her current low platelet count may be related to bleeding, ITP, DIC [but she has normal PT and PTT] or other causes. This evening she is clinically stable, denies SOB at rest (but tells me she is short winded when she tries to do anything) and denies any overt bleeding. Her urosepsis is being treated and I am hopeful for a quick turnaround. Added a type and screen to AM labs. Discussed with patient and family.  Appreciate the care Ms [place] is receiving. Will follow with you.   I personally saw this patient and performed a substantive portion of this encounter with the listed APP documented above.   Chauncey Cruel, MD Medical Oncology and Hematology [facility] [address] [place], [place] [address] Tel. [phone]    Fax. [phone]

## 2020-09-09 NOTE — Progress Notes (Signed)
PROGRESS NOTE    Michelle [name]  [id number] DOB: [birth year] DOA: 09/07/2020 PCP: [name], MD    Brief Narrative:  Michelle [name] is an 87 year old female with past medical history significant for breast cancer on Ibrance, essential hypertension who presented to [facility] ED on 7/25 with progressive weakness and burning with urination.  She reports feeling unwell for the last week and now bedbound for the last few days with associated body aches, chest wall pain, cough, nausea and dysuria.  Denies fever, no vomiting/diarrhea or abdominal pain.  In the ED, temperature 101.9 F, HR 102, RR 23, BP 128/64, SPO2 99% on room air.  Sodium 135, potassium 3.4, chloride 95, CO2 23, glucose 106, BUN 21, creatinine 0.91.  Anion gap 17.  Total bilirubin 1.5.  Lactic acid 1.2.  WBC 6.2, hemoglobin 9.2, platelets 31.  Procalcitonin 1.17.  PT 14.9, INR 1.2.  Influenza A/B PCR and COVID-19 PCR negative.  Urinalysis with large leukocytes, positive nitrite, many bacteria, greater than 50 WBCs.  Blood cultures were obtained.  Patient was started on empiric antibiotics.  TRH consulted for further evaluation and management of sepsis secondary to UTI with associated thrombocytopenia concerning for DIC versus chemotherapy induced.   Assessment & Plan:   Principal Problem:   Sepsis secondary to UTI [facility]) Active Problems:   Hypertension   Malignant neoplasm of upper-outer quadrant of left breast in female, estrogen receptor positive ([place])   Thrombocytopenia ([place])   Anemia   Sepsis, POA Urinary tract infection Patient presenting with progressive weakness, fatigue causing her to be more bedbound over the last few days.  On admission, patient was noted to have a fever, tachypnea, tachycardia, elevated procalcitonin, elevated total bilirubin, and significant thrombocytopenia.  Urinalysis consistent with UTI.  Urine culture with multiple species present. --Blood cultures 3 out of 4 + GPCs but BCID negative; await  further identification and susceptibilities, but high suspicion of contaminant --Ceftriaxone 2 g IV every 24 hours --LR 75 mL per hour --Continue monitor fever curve --Supportive care, antipyretics, antiemetics  Thrombocytopenia Anemia On admission patient's platelet level 31.  Now is trended down to 22.  Initially concern for possible DIC.  D-dimer elevated 16.13 with normal fibrinogen 436 and normal PT of 14.3.  Blood smear with schistocytes noted.  Also consideration for chemotherapy induced with Ibrance treatment for her breast cancer. --Medical oncology consulted for further evaluation --Plt 31>>22>23 (baseline 264 on 05/27/20) --Holding home Ibrance --receiving 2u pRBC and 2u platelets today --Continue to monitor CBC daily  Hx breast cancer on chemotherapy Patient follows with medical oncology, Dr. [name] outpatient.  Currently on Ibrance. --Oncology following as above --Holding Ibrance  Essential hypertension BP 144/66 this morning.  Home regimen includes losartan-HCTZ 100-12.5 mg p.o. daily. --Continue to hold home antihypertensives giving low blood pressure on admission --Continue monitor BP closely  Hypokalemia Potassium 3.8 this morning with magnesium 1.8.   --Repeat electrolytes in a.m.  HLD: On pravastatin outpatient, will hold for now.  Weakness/fatigue/deconditioning: --OT recommending SNF --PT consult pending  Severe protein calorie malnutrition Body mass index is 19.62 kg/m.  Patient with cachexia, fat depletion, muscle loss likely in the setting of malignancy on chemotherapy. -- Dietitian consultation   DVT prophylaxis: SCDs Start: 09/07/20 2328   Code Status: Full Code Family Communication: No family present at bedside this morning.  Attempted update patient's daughter [name] via telephone unsuccessful as phone went straight to voicemail and mailbox full.  Disposition Plan:  Level of care: Telemetry Status is: Inpatient  Remains inpatient  appropriate because:Ongoing diagnostic testing needed not appropriate for outpatient work up, Unsafe d/c plan, IV treatments appropriate due to intensity of illness or inability to take PO, and Inpatient level of care appropriate due to severity of illness  Dispo: The patient is from: Home              Anticipated d/c is to:  To be determined              Patient currently is not medically stable to d/c.   Difficult to place patient No   Consultants:  Medical oncology  Procedures:  None  Antimicrobials:  Ceftriaxone 7/25>>   Subjective: Patient seen examined at bedside, resting comfortably.  Sleeping but easily arousable.  Continues with significant fatigue.  No family present this morning.  Seen by oncology and transfusing 2 units PRBCs and platelets today.  No other complaints or concerns at this time.  Denies headache, no current fever/chills, no nausea/vomiting/diarrhea, no chest pain, no palpitations, no shortness of breath, no cough/congestion, no paresthesias.  No acute events overnight per nursing staff.    Objective: Vitals:   09/09/20 1501 09/09/20 1558 09/09/20 1635 09/09/20 1655  BP: (!) 112/52 (!) 109/51 (!) 117/59 (!) 118/56  Pulse: 84 73 76 72  Resp: '16 16 17 17  '$ Temp: 98.4 F (36.9 C) 98.1 F (36.7 C) 97.8 F (36.6 C) 98.6 F (37 C)  TempSrc: Oral Oral Oral Oral  SpO2: 99% 97% 98% 99%  Weight:      Height:        Intake/Output Summary (Last 24 hours) at 09/09/2020 1744 Last data filed at 09/09/2020 1600 Gross per 24 hour  Intake 1492.78 ml  Output 850 ml  Net 642.78 ml   Filed Weights   09/07/20 2156 09/08/20 0840  Weight: 47.2 kg 47.1 kg    Examination:  General exam: Appears calm and comfortable appears thin/cachectic; chronically ill in appearance Respiratory system: Clear to auscultation. Respiratory effort normal.  On room air Cardiovascular system: S1 & S2 heard, RRR. No JVD, murmurs, rubs, gallops or clicks. No pedal edema. Gastrointestinal  system: Abdomen is nondistended, soft and nontender. No organomegaly or masses felt. Normal bowel sounds heard. Central nervous system: Alert and oriented. No focal neurological deficits. Extremities: Symmetric 5 x 5 power. Skin: No rashes, lesions or ulcers Psychiatry: Judgement and insight appear normal. Mood & affect appropriate.     Data Reviewed: I have personally reviewed following labs and imaging studies  CBC: Recent Labs  Lab 09/07/20 2208 09/07/20 2325 09/08/20 0339 09/09/20 0604 09/09/20 1619  WBC 6.2  --  6.0 5.9 5.1  NEUTROABS 5.3  --   --   --  4.4  HGB 9.2*  --  8.4* 7.3* 7.5*  HCT 27.8*  --  24.5* 21.5* 21.9*  MCV 101.1*  --  99.2 97.7 97.8  PLT 31* 28* 22* 23* 28*   Basic Metabolic Panel: Recent Labs  Lab 09/07/20 2208 09/08/20 0339 09/09/20 0604  NA 135 134* 136  K 3.4* 3.3* 3.8  CL 95* 99 99  CO2 '23 25 27  '$ GLUCOSE 106* 107* 102*  BUN '21 20 16  '$ CREATININE 0.91 0.84 0.65  CALCIUM 9.2 8.7* 8.6*  MG  --  1.9 1.8   GFR: Estimated Creatinine Clearance: 36.1 mL/min (by C-G formula based on SCr of 0.65 mg/dL). Liver Function Tests: Recent Labs  Lab 09/07/20 2208 09/08/20 0339 09/09/20 0604 09/09/20 0832  AST '29 27 26  '$ --  ALT '21 21 18  '$ --   ALKPHOS 67 61 57  --   BILITOT 1.5* 1.4* 0.9 1.0  PROT 6.5 5.7* 5.0*  --   ALBUMIN 3.0* 2.6* 2.4*  --    No results for input(s): LIPASE, AMYLASE in the last 168 hours. No results for input(s): AMMONIA in the last 168 hours. Coagulation Profile: Recent Labs  Lab 09/07/20 2208 09/07/20 2325 09/08/20 0339  INR 1.2 1.2 1.1   Cardiac Enzymes: No results for input(s): CKTOTAL, CKMB, CKMBINDEX, TROPONINI in the last 168 hours. BNP (last 3 results) No results for input(s): PROBNP in the last 8760 hours. HbA1C: No results for input(s): HGBA1C in the last 72 hours. CBG: No results for input(s): GLUCAP in the last 168 hours. Lipid Profile: No results for input(s): CHOL, HDL, LDLCALC, TRIG, CHOLHDL,  LDLDIRECT in the last 72 hours. Thyroid Function Tests: No results for input(s): TSH, T4TOTAL, FREET4, T3FREE, THYROIDAB in the last 72 hours. Anemia Panel: Recent Labs    09/09/20 0739  RETICCTPCT 0.5   Sepsis Labs: Recent Labs  Lab 09/07/20 2208 09/08/20 0339  PROCALCITON  --  1.17  LATICACIDVEN 1.2  --     Recent Results (from the past 240 hour(s))  Blood Culture (routine x 2)     Status: None (Preliminary result)   Collection Time: 09/07/20 10:08 PM   Specimen: BLOOD LEFT FOREARM  Result Value Ref Range Status   Specimen Description   Final    BLOOD LEFT FOREARM Performed at [facility], [place] [address], [place], [place] [address]    Special Requests   Final    BOTTLES DRAWN AEROBIC AND ANAEROBIC Blood Culture results may not be optimal due to an inadequate volume of blood received in culture bottles Performed at [place] [address], [place], [place] [address]    Culture  Setup Time   Final    GRAM POSITIVE COCCI AEROBIC BOTTLE ONLY CRITICAL VALUE NOTED.  VALUE IS CONSISTENT WITH PREVIOUSLY REPORTED AND CALLED VALUE.    Culture   Final    NO GROWTH 1 DAY Performed at [facility], [place] [address] [place], [place] [address]    Report Status PENDING  Incomplete  Blood Culture (routine x 2)     Status: None (Preliminary result)   Collection Time: 09/07/20 10:08 PM   Specimen: BLOOD LEFT WRIST  Result Value Ref Range Status   Specimen Description   Final    BLOOD LEFT WRIST Performed at [place] [address] [place], [place] [address]    Special Requests   Final    BOTTLES DRAWN AEROBIC AND ANAEROBIC Blood Culture results may not be optimal due to an inadequate volume of blood received in culture bottles Performed at [place] [address] [place], [place] [address]    Culture  Setup Time   Final    GRAM POSITIVE COCCI IN CLUSTERS IN BOTH AEROBIC AND ANAEROBIC BOTTLES CRITICAL  RESULT CALLED TO, READ BACK BY AND VERIFIED WITH: PHARMD M.TECKER AT [id number] ON 09/09/2020 BY T.SAAD Performed at [facility], [place] [address] [place],  [address]    Culture [facility] POSITIVE COCCI  Final   Report Status PENDING  Incomplete  Blood Culture ID Panel (Reflexed)     Status: None   Collection Time: 09/07/20 10:08 PM  Result Value Ref Range Status   Enterococcus faecalis NOT DETECTED NOT DETECTED Final   Enterococcus Faecium  NOT DETECTED NOT DETECTED Final   Listeria monocytogenes NOT DETECTED NOT DETECTED Final   Staphylococcus species NOT DETECTED NOT DETECTED Final   Staphylococcus aureus (BCID) NOT DETECTED NOT DETECTED Final   Staphylococcus epidermidis NOT DETECTED NOT DETECTED Final   Staphylococcus lugdunensis NOT DETECTED NOT DETECTED Final   Streptococcus species NOT DETECTED NOT DETECTED Final   Streptococcus agalactiae NOT DETECTED NOT DETECTED Final   Streptococcus pneumoniae NOT DETECTED NOT DETECTED Final   Streptococcus pyogenes NOT DETECTED NOT DETECTED Final   A.calcoaceticus-baumannii NOT DETECTED NOT DETECTED Final   Bacteroides fragilis NOT DETECTED NOT DETECTED Final   Enterobacterales NOT DETECTED NOT DETECTED Final   Enterobacter cloacae complex NOT DETECTED NOT DETECTED Final   Escherichia coli NOT DETECTED NOT DETECTED Final   Klebsiella aerogenes NOT DETECTED NOT DETECTED Final   Klebsiella oxytoca NOT DETECTED NOT DETECTED Final   Klebsiella pneumoniae NOT DETECTED NOT DETECTED Final   Proteus species NOT DETECTED NOT DETECTED Final   Salmonella species NOT DETECTED NOT DETECTED Final   Serratia marcescens NOT DETECTED NOT DETECTED Final   Haemophilus influenzae NOT DETECTED NOT DETECTED Final   Neisseria meningitidis NOT DETECTED NOT DETECTED Final   Pseudomonas aeruginosa NOT DETECTED NOT DETECTED Final   Stenotrophomonas maltophilia NOT DETECTED NOT DETECTED Final   Candida albicans NOT DETECTED NOT DETECTED Final   Candida auris NOT  DETECTED NOT DETECTED Final   Candida glabrata NOT DETECTED NOT DETECTED Final   Candida krusei NOT DETECTED NOT DETECTED Final   Candida parapsilosis NOT DETECTED NOT DETECTED Final   Candida tropicalis NOT DETECTED NOT DETECTED Final   Cryptococcus neoformans/gattii NOT DETECTED NOT DETECTED Final    Comment: Performed at [facility], [address], [place], [place] [address]  Resp Panel by RT-PCR (Flu A&B, Covid) Nasopharyngeal Swab     Status: None   Collection Time: 09/07/20 10:10 PM   Specimen: Nasopharyngeal Swab; Nasopharyngeal(NP) swabs in vial transport medium  Result Value Ref Range Status   SARS Coronavirus 2 by RT PCR NEGATIVE NEGATIVE Final    Comment: (NOTE) SARS-CoV-2 target nucleic acids are NOT DETECTED.  The SARS-CoV-2 RNA is generally detectable in upper respiratory specimens during the acute phase of infection. The lowest concentration of SARS-CoV-2 viral copies this assay can detect is 138 copies/mL. A negative result does not preclude SARS-Cov-2 infection and should not be used as the sole basis for treatment or other patient management decisions. A negative result may occur with  improper specimen collection/handling, submission of specimen other than nasopharyngeal swab, presence of viral mutation(s) within the areas targeted by this assay, and inadequate number of viral copies(<138 copies/mL). A negative result must be combined with clinical observations, patient history, and epidemiological information. The expected result is Negative.  Fact Sheet for Patients:  EntrepreneurPulse.com.au  Fact Sheet for Healthcare Providers:  IncredibleEmployment.be  This test is no t yet approved or cleared by the [place] FDA and  has been authorized for detection and/or diagnosis of SARS-CoV-2 by FDA under an Emergency Use Authorization (EUA). This EUA will remain  in effect (meaning this test can be used) for the  duration of the COVID-19 declaration under Section 564(b)(1) of the Act, 21 U.S.C.section 360bbb-3(b)(1), unless the authorization is terminated  or revoked sooner.       Influenza A by PCR NEGATIVE NEGATIVE Final   Influenza B by PCR NEGATIVE NEGATIVE Final    Comment: (NOTE) The Xpert Xpress SARS-CoV-2/FLU/RSV plus assay is intended as an aid  in the diagnosis of influenza from Nasopharyngeal swab specimens and should not be used as a sole basis for treatment. Nasal washings and aspirates are unacceptable for Xpert Xpress SARS-CoV-2/FLU/RSV testing.  Fact Sheet for Patients: EntrepreneurPulse.com.au  Fact Sheet for Healthcare Providers: IncredibleEmployment.be  This test is not yet approved or cleared by the [place] FDA and has been authorized for detection and/or diagnosis of SARS-CoV-2 by FDA under an Emergency Use Authorization (EUA). This EUA will remain in effect (meaning this test can be used) for the duration of the COVID-19 declaration under Section 564(b)(1) of the Act, 21 U.S.C. section 360bbb-3(b)(1), unless the authorization is terminated or revoked.  Performed at [facility], [place] [address], [place], [place] [address]   Urine Culture     Status: Abnormal   Collection Time: 09/07/20 10:40 PM   Specimen: In/Out Cath Urine  Result Value Ref Range Status   Specimen Description   Final    IN/OUT CATH URINE Performed at [place] [address] [place], [place] [address]    Special Requests   Final    NONE Performed at [facility], [place] [address] [place], [place] [address]    Culture MULTIPLE SPECIES PRESENT, SUGGEST RECOLLECTION (A)  Final   Report Status 09/09/2020 FINAL  Final         Radiology Studies: DG Chest Port 1 View  Result Date: 09/07/2020 CLINICAL DATA:  Questionable sepsis - evaluate for abnormality EXAM: PORTABLE CHEST 1 VIEW  COMPARISON:  Radiograph 07/05/2012, chest CT 06/07/2019 FINDINGS: Unchanged cardiomediastinal silhouette. Aortic arch calcifications. There is no focal airspace disease. There is no large pleural effusion or visible pneumothorax. There is no acute osseous abnormality. Dextroconvex curvature of the spine. IMPRESSION: No focal airspace consolidation. Electronically Signed   By: Maurine Simmering   On: 09/07/2020 22:01        Scheduled Meds:  Chlorhexidine Gluconate Cloth  6 each Topical Daily   feeding supplement  237 mL Oral BID BM   multivitamin with minerals  1 tablet Oral Daily   Continuous Infusions:  cefTRIAXone (ROCEPHIN)  IV 2 g (09/09/20 1500)   lactated ringers Stopped (09/08/20 1453)     LOS: 2 days    Time spent: 39 minutes spent on chart review, discussion with nursing staff, consultants, updating family and interview/physical exam; more than 50% of that time was spent in counseling and/or coordination of care.    Camaria Gerald J [place] DO [facility] Available via Epic secure chat 7am-7pm After these hours, please refer to coverage provider listed on [email or web address] 09/09/2020, 5:44 PM

## 2020-09-09 NOTE — Progress Notes (Signed)
PT Cancellation Note  Patient Details Name: Michelle [name] MRN: [id number] DOB: [birth year]   Cancelled Treatment:    Reason Eval/Treat Not Completed: Other (comment) (Pt refused due to fatigue. Nurse agrees. Will return in am.)   Alvira Philips 09/09/2020, 10:43 AM Governor Matos M,PT Acute Rehab Services [phone] (pager)

## 2020-09-10 DIAGNOSIS — N390 Urinary tract infection, site not specified: Secondary | ICD-10-CM | POA: Diagnosis not present

## 2020-09-10 DIAGNOSIS — A419 Sepsis, unspecified organism: Secondary | ICD-10-CM | POA: Diagnosis not present

## 2020-09-10 LAB — BPAM FFP
Blood Product Expiration Date: 202208012359
Blood Product Expiration Date: 202208012359
ISSUE DATE / TIME: 202207271248
ISSUE DATE / TIME: 202207271441
Unit Type and Rh: 5100
Unit Type and Rh: 5100

## 2020-09-10 LAB — CBC
HCT: 29.9 % — ABNORMAL LOW (ref 36.0–46.0)
Hemoglobin: 10.1 g/dL — ABNORMAL LOW (ref 12.0–15.0)
MCH: 30.2 pg (ref 26.0–34.0)
MCHC: 33.8 g/dL (ref 30.0–36.0)
MCV: 89.5 fL (ref 80.0–100.0)
Platelets: 27 10*3/uL — CL (ref 150–400)
RBC: 3.34 MIL/uL — ABNORMAL LOW (ref 3.87–5.11)
RDW: 18.6 % — ABNORMAL HIGH (ref 11.5–15.5)
WBC: 4.9 10*3/uL (ref 4.0–10.5)
nRBC: 0 % (ref 0.0–0.2)

## 2020-09-10 LAB — COMPREHENSIVE METABOLIC PANEL
ALT: 22 U/L (ref 0–44)
AST: 26 U/L (ref 15–41)
Albumin: 2.5 g/dL — ABNORMAL LOW (ref 3.5–5.0)
Alkaline Phosphatase: 72 U/L (ref 38–126)
Anion gap: 6 (ref 5–15)
BUN: 17 mg/dL (ref 8–23)
CO2: 28 mmol/L (ref 22–32)
Calcium: 8.4 mg/dL — ABNORMAL LOW (ref 8.9–10.3)
Chloride: 101 mmol/L (ref 98–111)
Creatinine, Ser: 0.7 mg/dL (ref 0.44–1.00)
GFR, Estimated: >60 mL/min (ref >60)
Glucose, Bld: 216 mg/dL — ABNORMAL HIGH (ref 70–99)
Potassium: 4 mmol/L (ref 3.5–5.1)
Sodium: 135 mmol/L (ref 135–145)
Total Bilirubin: 0.8 mg/dL (ref 0.3–1.2)
Total Protein: 5.5 g/dL — ABNORMAL LOW (ref 6.5–8.1)

## 2020-09-10 LAB — PREPARE FRESH FROZEN PLASMA

## 2020-09-10 LAB — HAPTOGLOBIN: Haptoglobin: 114 mg/dL (ref 41–333)

## 2020-09-10 LAB — MAGNESIUM: Magnesium: 2 mg/dL (ref 1.7–2.4)

## 2020-09-10 LAB — ADAMTS13 ACTIVITY REFLEX

## 2020-09-10 LAB — ADAMTS13 ACTIVITY: Adamts 13 Activity: 63 % — ABNORMAL LOW (ref >66.8)

## 2020-09-10 MED ORDER — IMMUNE GLOBULIN (HUMAN) 10 GM/100ML IV SOLN
1.0000 g/kg | Freq: Every day | INTRAVENOUS | Status: AC
  Administered 2020-09-10 – 2020-09-11 (×2): 45 g via INTRAVENOUS
  Filled 2020-09-10: qty 400
  Filled 2020-09-10: qty 50

## 2020-09-10 MED ORDER — SODIUM CHLORIDE 0.9% FLUSH: 10.0000 mL | INTRAVENOUS | Status: DC | PRN

## 2020-09-10 MED ORDER — ACETAMINOPHEN 325 MG PO TABS
650.0000 mg | ORAL_TABLET | Freq: Once | ORAL | Status: AC
  Administered 2020-09-10: 650 mg via ORAL
  Filled 2020-09-10: qty 2

## 2020-09-10 MED ORDER — HEPARIN SOD (PORK) LOCK FLUSH 100 UNIT/ML IV SOLN: 250.0000 [IU] | INTRAVENOUS | Status: DC | PRN

## 2020-09-10 MED ORDER — DIPHENHYDRAMINE HCL 25 MG PO CAPS
25.0000 mg | ORAL_CAPSULE | Freq: Once | ORAL | Status: AC
  Administered 2020-09-10: 25 mg via ORAL
  Filled 2020-09-10: qty 1

## 2020-09-10 MED ORDER — TRAMADOL HCL 50 MG PO TABS
50.0000 mg | ORAL_TABLET | Freq: Four times a day (QID) | ORAL | Status: DC | PRN
  Administered 2020-09-10 – 2020-09-12 (×4): 50 mg via ORAL
  Filled 2020-09-10 (×4): qty 1

## 2020-09-10 MED ORDER — LIP MEDEX EX OINT
1.0000 "application " | TOPICAL_OINTMENT | CUTANEOUS | Status: DC | PRN
  Filled 2020-09-10: qty 7

## 2020-09-10 MED ORDER — SODIUM CHLORIDE 0.9% IV SOLUTION
250.0000 mL | Freq: Once | INTRAVENOUS | Status: AC
  Administered 2020-09-10: 250 mL via INTRAVENOUS

## 2020-09-10 NOTE — Progress Notes (Signed)
PROGRESS NOTE    ROHNDA [name]  [id number] DOB: Feb 29, 1930 DOA: 09/07/2020 PCP: [name], MD    Brief Narrative:  Michelle [name] is an 87 year old female with past medical history significant for breast cancer on Ibrance, essential hypertension who presented to [facility] ED on 7/25 with progressive weakness and burning with urination.  She reports feeling unwell for the last week and now bedbound for the last few days with associated body aches, chest wall pain, cough, nausea and dysuria.  Denies fever, no vomiting/diarrhea or abdominal pain.  In the ED, temperature 101.9 F, HR 102, RR 23, BP 128/64, SPO2 99% on room air.  Sodium 135, potassium 3.4, chloride 95, CO2 23, glucose 106, BUN 21, creatinine 0.91.  Anion gap 17.  Total bilirubin 1.5.  Lactic acid 1.2.  WBC 6.2, hemoglobin 9.2, platelets 31.  Procalcitonin 1.17.  PT 14.9, INR 1.2.  Influenza A/B PCR and COVID-19 PCR negative.  Urinalysis with large leukocytes, positive nitrite, many bacteria, greater than 50 WBCs.  Blood cultures were obtained.  Patient was started on empiric antibiotics.  TRH consulted for further evaluation and management of sepsis secondary to UTI with associated thrombocytopenia concerning for DIC versus chemotherapy induced.   Assessment & Plan:   Principal Problem:   Sepsis secondary to UTI [facility]) Active Problems:   Hypertension   Malignant neoplasm of upper-outer quadrant of left breast in female, estrogen receptor positive ([place])   Thrombocytopenia ([place])   Anemia   Sepsis, POA Urinary tract infection Patient presenting with progressive weakness, fatigue causing her to be more bedbound over the last few days.  On admission, patient was noted to have a fever, tachypnea, tachycardia, elevated procalcitonin, elevated total bilirubin, and significant thrombocytopenia.  Urinalysis consistent with UTI.  Urine culture with multiple species present. --Blood cultures 3 out of 4 + GPCs but BCID negative; await  further identification and susceptibilities, but high suspicion of contaminant --Ceftriaxone 2 g IV every 24 hours --LR 75 mL per hour --Continue monitor fever curve --Supportive care, antipyretics, antiemetics  Thrombocytopenia concerning for ITP/TTP vs sepsis Anemia On admission patient's platelet level 31.  Now is trended down to 22.  Initially concern for possible DIC.  D-dimer elevated 16.13 with normal fibrinogen 436 and normal PT of 14.3.  Blood smear with schistocytes noted.  Also consideration for chemotherapy induced with Ibrance treatment for her breast cancer. --Medical oncology following, appreciate assistance  --Plt 31>>22>23>28 (baseline 264 on 05/27/20) --Holding home Ibrance --s/p2u pRBC and 2u platelets 7/27 --repeat FFP today and to receive IVIG per oncology --Continue to monitor CBC daily  Hx breast cancer on chemotherapy Patient follows with medical oncology, Dr. [name] outpatient.  Currently on Ibrance. --Oncology following as above --Holding Ibrance  Essential hypertension BP 128/57 this morning.  Home regimen includes losartan-HCTZ 100-12.5 mg p.o. daily. --Continue to hold home antihypertensives giving low blood pressure on admission --Continue monitor BP closely  Hypokalemia Potassium 4.0 this morning with magnesium 2.0  --Repeat electrolytes in a.m.  HLD: On pravastatin outpatient, will hold for now.  Weakness/fatigue/deconditioning: --OT recommending SNF --PT consult pending  Severe protein calorie malnutrition Body mass index is 19.62 kg/m.  Patient with cachexia, fat depletion, muscle loss likely in the setting of malignancy on chemotherapy. Nutrition Status: Nutrition Problem: Increased nutrient needs Etiology: acute illness Signs/Symptoms: estimated needs Interventions: Ensure Enlive (each supplement provides 350kcal and 20 grams of protein), MVI --Dietitian following   DVT prophylaxis: SCDs Start: 09/07/20 2328   Code Status: Full  Code  Family Communication: No family present at bedside this morning.  Attempted update patient's daughter [name] via telephone unsuccessful as phone went straight to voicemail and mailbox full.  Disposition Plan:  Level of care: Telemetry Status is: Inpatient  Remains inpatient appropriate because:Ongoing diagnostic testing needed not appropriate for outpatient work up, Unsafe d/c plan, IV treatments appropriate due to intensity of illness or inability to take PO, and Inpatient level of care appropriate due to severity of illness  Dispo: The patient is from: Home              Anticipated d/c is to:  To be determined              Patient currently is not medically stable to d/c.   Difficult to place patient No   Consultants:  Medical oncology  Procedures:  None  Antimicrobials:  Ceftriaxone 7/25>>   Subjective: Patient seen examined at bedside, resting comfortably.  Sleeping but easily arousable.  Feels fatigued and complaining of pain to feet and shoulders.  No family present.  Discussed with oncologist morning, concern for possible ITP and will receive FFP again today with IVIG.  No other complaints or concerns at this time.  Denies headache, no current fever/chills, no nausea/vomiting/diarrhea, no chest pain, no palpitations, no shortness of breath, no cough/congestion, no paresthesias.  No acute events overnight per nursing staff.    Objective: Vitals:   09/09/20 2049 09/10/20 0019 09/10/20 0053 09/10/20 0340  BP: 115/61 (!) 123/56 (!) 114/52 (!) 128/57  Pulse: 76 70 74 71  Resp: '15 17 18 15  '$ Temp:  99.6 F (37.6 C) 98.4 F (36.9 C) 98.3 F (36.8 C)  TempSrc:   Oral Oral  SpO2: 98% 98% 99% 98%  Weight:      Height:        Intake/Output Summary (Last 24 hours) at 09/10/2020 1147 Last data filed at 09/10/2020 0559 Gross per 24 hour  Intake 1682.16 ml  Output 1450 ml  Net 232.16 ml   Filed Weights   09/07/20 2156 09/08/20 0840  Weight: 47.2 kg 47.1 kg     Examination:  General exam: Appears calm and comfortable appears thin/cachectic; chronically ill in appearance Respiratory system: Clear to auscultation. Respiratory effort normal.  On room air Cardiovascular system: S1 & S2 heard, RRR. No JVD, murmurs, rubs, gallops or clicks. No pedal edema. Gastrointestinal system: Abdomen is nondistended, soft and nontender. No organomegaly or masses felt. Normal bowel sounds heard. Central nervous system: Alert and oriented. No focal neurological deficits. Extremities: Symmetric 5 x 5 power. Skin: No rashes, lesions or ulcers Psychiatry: Judgement and insight appear normal. Mood & affect appropriate.     Data Reviewed: I have personally reviewed following labs and imaging studies  CBC: Recent Labs  Lab 09/07/20 2208 09/07/20 2325 09/08/20 0339 09/09/20 0604 09/09/20 1619 09/10/20 0600  WBC 6.2  --  6.0 5.9 5.1 4.9  NEUTROABS 5.3  --   --   --  4.4  --   HGB 9.2*  --  8.4* 7.3* 7.5* 10.1*  HCT 27.8*  --  24.5* 21.5* 21.9* 29.9*  MCV 101.1*  --  99.2 97.7 97.8 89.5  PLT 31* 28* 22* 23* 28* 27*   Basic Metabolic Panel: Recent Labs  Lab 09/07/20 2208 09/08/20 0339 09/09/20 0604 09/10/20 0600  NA 135 134* 136 135  K 3.4* 3.3* 3.8 4.0  CL 95* 99 99 101  CO2 '23 25 27 28  '$ GLUCOSE 106* 107* 102* 216*  BUN '21 20 16 17  '$ CREATININE 0.91 0.84 0.65 0.70  CALCIUM 9.2 8.7* 8.6* 8.4*  MG  --  1.9 1.8 2.0   GFR: Estimated Creatinine Clearance: 36.1 mL/min (by C-G formula based on SCr of 0.7 mg/dL). Liver Function Tests: Recent Labs  Lab 09/07/20 2208 09/08/20 0339 09/09/20 0604 09/09/20 0832 09/10/20 0600  AST '29 27 26  '$ --  26  ALT '21 21 18  '$ --  22  ALKPHOS 67 61 57  --  72  BILITOT 1.5* 1.4* 0.9 1.0 0.8  PROT 6.5 5.7* 5.0*  --  5.5*  ALBUMIN 3.0* 2.6* 2.4*  --  2.5*   No results for input(s): LIPASE, AMYLASE in the last 168 hours. No results for input(s): AMMONIA in the last 168 hours. Coagulation Profile: Recent Labs   Lab 09/07/20 2208 09/07/20 2325 09/08/20 0339  INR 1.2 1.2 1.1   Cardiac Enzymes: No results for input(s): CKTOTAL, CKMB, CKMBINDEX, TROPONINI in the last 168 hours. BNP (last 3 results) No results for input(s): PROBNP in the last 8760 hours. HbA1C: No results for input(s): HGBA1C in the last 72 hours. CBG: No results for input(s): GLUCAP in the last 168 hours. Lipid Profile: No results for input(s): CHOL, HDL, LDLCALC, TRIG, CHOLHDL, LDLDIRECT in the last 72 hours. Thyroid Function Tests: No results for input(s): TSH, T4TOTAL, FREET4, T3FREE, THYROIDAB in the last 72 hours. Anemia Panel: Recent Labs    09/09/20 0739  RETICCTPCT 0.5   Sepsis Labs: Recent Labs  Lab 09/07/20 2208 09/08/20 0339  PROCALCITON  --  1.17  LATICACIDVEN 1.2  --     Recent Results (from the past 240 hour(s))  Blood Culture (routine x 2)     Status: None (Preliminary result)   Collection Time: 09/07/20 10:08 PM   Specimen: BLOOD LEFT FOREARM  Result Value Ref Range Status   Specimen Description   Final    BLOOD LEFT FOREARM Performed at [facility], [place] [address] [place], [place] [address]    Special Requests   Final    BOTTLES DRAWN AEROBIC AND ANAEROBIC Blood Culture results may not be optimal due to an inadequate volume of blood received in culture bottles Performed at [place] [address], [place], [place] [address]    Culture  Setup Time   Final    GRAM POSITIVE COCCI AEROBIC BOTTLE ONLY CRITICAL VALUE NOTED.  VALUE IS CONSISTENT WITH PREVIOUSLY REPORTED AND CALLED VALUE. Performed at [facility], [place] [address], [place], [place] [address]    Culture GRAM POSITIVE COCCI  Final   Report Status PENDING  Incomplete  Blood Culture (routine x 2)     Status: None (Preliminary result)   Collection Time: 09/07/20 10:08 PM   Specimen: BLOOD LEFT WRIST  Result Value Ref Range Status   Specimen Description   Final    BLOOD LEFT WRIST Performed at  [facility], [address] [place], [place] [address]    Special Requests   Final    BOTTLES DRAWN AEROBIC AND ANAEROBIC Blood Culture results may not be optimal due to an inadequate volume of blood received in culture bottles Performed at [place] [address], [place], [place] [address]    Culture  Setup Time   Final    GRAM POSITIVE COCCI IN CLUSTERS IN BOTH AEROBIC AND ANAEROBIC BOTTLES CRITICAL RESULT CALLED TO, READ BACK BY AND VERIFIED WITH: PHARMD M.TECKER AT [place] ON 09/09/2020 BY  T.SAAD Performed at [facility], [place] [address] [place], [place] [address]    Culture GRAM POSITIVE COCCI  Final   Report Status PENDING  Incomplete  Blood Culture ID Panel (Reflexed)     Status: None   Collection Time: 09/07/20 10:08 PM  Result Value Ref Range Status   Enterococcus faecalis NOT DETECTED NOT DETECTED Final   Enterococcus Faecium NOT DETECTED NOT DETECTED Final   Listeria monocytogenes NOT DETECTED NOT DETECTED Final   Staphylococcus species NOT DETECTED NOT DETECTED Final   Staphylococcus aureus (BCID) NOT DETECTED NOT DETECTED Final   Staphylococcus epidermidis NOT DETECTED NOT DETECTED Final   Staphylococcus lugdunensis NOT DETECTED NOT DETECTED Final   Streptococcus species NOT DETECTED NOT DETECTED Final   Streptococcus agalactiae NOT DETECTED NOT DETECTED Final   Streptococcus pneumoniae NOT DETECTED NOT DETECTED Final   Streptococcus pyogenes NOT DETECTED NOT DETECTED Final   A.calcoaceticus-baumannii NOT DETECTED NOT DETECTED Final   Bacteroides fragilis NOT DETECTED NOT DETECTED Final   Enterobacterales NOT DETECTED NOT DETECTED Final   Enterobacter cloacae complex NOT DETECTED NOT DETECTED Final   Escherichia coli NOT DETECTED NOT DETECTED Final   Klebsiella aerogenes NOT DETECTED NOT DETECTED Final   Klebsiella oxytoca NOT DETECTED NOT DETECTED Final   Klebsiella pneumoniae NOT DETECTED NOT DETECTED Final   Proteus species NOT  DETECTED NOT DETECTED Final   Salmonella species NOT DETECTED NOT DETECTED Final   Serratia marcescens NOT DETECTED NOT DETECTED Final   Haemophilus influenzae NOT DETECTED NOT DETECTED Final   Neisseria meningitidis NOT DETECTED NOT DETECTED Final   Pseudomonas aeruginosa NOT DETECTED NOT DETECTED Final   Stenotrophomonas maltophilia NOT DETECTED NOT DETECTED Final   Candida albicans NOT DETECTED NOT DETECTED Final   Candida auris NOT DETECTED NOT DETECTED Final   Candida glabrata NOT DETECTED NOT DETECTED Final   Candida krusei NOT DETECTED NOT DETECTED Final   Candida parapsilosis NOT DETECTED NOT DETECTED Final   Candida tropicalis NOT DETECTED NOT DETECTED Final   Cryptococcus neoformans/gattii NOT DETECTED NOT DETECTED Final    Comment: Performed at [facility], [address] [place], [place] [address]  Resp Panel by RT-PCR (Flu A&B, Covid) Nasopharyngeal Swab     Status: None   Collection Time: 09/07/20 10:10 PM   Specimen: Nasopharyngeal Swab; Nasopharyngeal(NP) swabs in vial transport medium  Result Value Ref Range Status   SARS Coronavirus 2 by RT PCR NEGATIVE NEGATIVE Final    Comment: (NOTE) SARS-CoV-2 target nucleic acids are NOT DETECTED.  The SARS-CoV-2 RNA is generally detectable in upper respiratory specimens during the acute phase of infection. The lowest concentration of SARS-CoV-2 viral copies this assay can detect is 138 copies/mL. A negative result does not preclude SARS-Cov-2 infection and should not be used as the sole basis for treatment or other patient management decisions. A negative result may occur with  improper specimen collection/handling, submission of specimen other than nasopharyngeal swab, presence of viral mutation(s) within the areas targeted by this assay, and inadequate number of viral copies(<138 copies/mL). A negative result must be combined with clinical observations, patient history, and epidemiological information. The expected  result is Negative.  Fact Sheet for Patients:  EntrepreneurPulse.com.au  Fact Sheet for Healthcare Providers:  IncredibleEmployment.be  This test is no t yet approved or cleared by the [place] FDA and  has been authorized for detection and/or diagnosis of SARS-CoV-2 by FDA under an Emergency Use Authorization (EUA). This EUA will remain  in effect (meaning  this test can be used) for the duration of the COVID-19 declaration under Section 564(b)(1) of the Act, 21 U.S.C.section 360bbb-3(b)(1), unless the authorization is terminated  or revoked sooner.       Influenza A by PCR NEGATIVE NEGATIVE Final   Influenza B by PCR NEGATIVE NEGATIVE Final    Comment: (NOTE) The Xpert Xpress SARS-CoV-2/FLU/RSV plus assay is intended as an aid in the diagnosis of influenza from Nasopharyngeal swab specimens and should not be used as a sole basis for treatment. Nasal washings and aspirates are unacceptable for Xpert Xpress SARS-CoV-2/FLU/RSV testing.  Fact Sheet for Patients: EntrepreneurPulse.com.au  Fact Sheet for Healthcare Providers: IncredibleEmployment.be  This test is not yet approved or cleared by the [place] FDA and has been authorized for detection and/or diagnosis of SARS-CoV-2 by FDA under an Emergency Use Authorization (EUA). This EUA will remain in effect (meaning this test can be used) for the duration of the COVID-19 declaration under Section 564(b)(1) of the Act, 21 U.S.C. section 360bbb-3(b)(1), unless the authorization is terminated or revoked.  Performed at [facility], [place] [address] [place], [place] [address]   Urine Culture     Status: Abnormal   Collection Time: 09/07/20 10:40 PM   Specimen: In/Out Cath Urine  Result Value Ref Range Status   Specimen Description   Final    IN/OUT CATH URINE Performed at [place] [address]  [place], [place] [address]    Special Requests   Final    NONE Performed at [facility], [place] [address], [place], [place] [address]    Culture MULTIPLE SPECIES PRESENT, SUGGEST RECOLLECTION (A)  Final   Report Status 09/09/2020 FINAL  Final         Radiology Studies: No results found.      Scheduled Meds:  sodium chloride  250 mL Intravenous Once   Chlorhexidine Gluconate Cloth  6 each Topical Daily   feeding supplement  237 mL Oral BID BM   multivitamin with minerals  1 tablet Oral Daily   Continuous Infusions:  cefTRIAXone (ROCEPHIN)  IV 2 g (09/09/20 1500)   Immune Globulin 10%     lactated ringers Stopped (09/08/20 1453)     LOS: 3 days    Time spent: 39 minutes spent on chart review, discussion with nursing staff, consultants, updating family and interview/physical exam; more than 50% of that time was spent in counseling and/or coordination of care.    Briani Maul J [place] DO [facility] Available via Epic secure chat 7am-7pm After these hours, please refer to coverage provider listed on [email or web address] 09/10/2020, 11:47 AM

## 2020-09-10 NOTE — Evaluation (Signed)
Physical Therapy Evaluation Patient Details Name: MARWAH LASTRAPES MRN: [id number] DOB: November 20, 1930 Today's Date: 09/10/2020   History of Present Illness  patient is a 87 year old female who presented to the hospital on 7/26 with shortness of breath and breast pain. patient was admitted for sepsis secondary to urinary tract infection and thrombocytopenia. [id number] cancer and HTN  Clinical Impression  Pt is an 87 y.o. female with above HPI resulting in the deficits listed below (see PT Problem List). Pt is currently below reported baseline independence requiring up to MIN A+2 for safety and performance of all mobility. Pt ambulated ~60f from EOB to recliner in room with +2 HHA for stability/balance, limited with further distance by fatigue. Pt lives alone and has daughter who lives nearby, but works during the day. Pt states that she will have to confirm with her son the level of assist that can be provided at home. Recommend SNF if family is unable to provide 24/7 supervision as pt requires increased assistance to maintain safety with mobility. Pt will benefit from skilled PT to increase independence and maximize safety with mobility to allow discharge to the venue listed below.       Follow Up Recommendations SNF;Supervision for mobility/OOB (unless family is able to provide 24/7 supervision-pending pt progress)    Equipment Recommendations   (final recommendation pending pt progress)    Recommendations for Other Services       Precautions / Restrictions Precautions Precautions: Fall Restrictions Weight Bearing Restrictions: No      Mobility  Bed Mobility Overal bed mobility: Needs Assistance Bed Mobility: Supine to Sit     Supine to sit: Min assist;HOB elevated     General bed mobility comments: MIN A to bring trunk to upright with use of bed rails and HHA, cues for sequencing. Assist at chuck pad for scooting to EOB for feet flat in prep for sit to stand transfer     Transfers Overall transfer level: Needs assistance Equipment used: 2 person hand held assist Transfers: Sit to/from Stand Sit to Stand: Min assist;+2 physical assistance;+2 safety/equipment         General transfer comment: MIN A +2 for safety and power up with sit to stand transfer from EOB  Ambulation/Gait Ambulation/Gait assistance: Min assist;+2 physical assistance;+2 safety/equipment Gait Distance (Feet): 6 Feet Assistive device: 2 person hand held assist Gait Pattern/deviations: Shuffle;Trunk flexed;Decreased stride length Gait velocity: decr   General Gait Details: Pt ambulated ~638ffrom EOB over to recliner in room with +2 HHA for stability/balance. PT confirming home equipiment asking pt if she has access to RW or cane at home, pt repeatedly stating "no,no,no" and shaking her head. Pt limited with further ambulation distance due to fatigue.  Stairs            Wheelchair Mobility    Modified Rankin (Stroke Patients Only)       Balance Overall balance assessment: Needs assistance Sitting-balance support: Feet supported Sitting balance-Leahy Scale: Fair     Standing balance support: Bilateral upper extremity supported;During functional activity Standing balance-Leahy Scale: Poor Standing balance comment: use of external assist for standing balance                             Pertinent Vitals/Pain Pain Assessment: Faces Faces Pain Scale: Hurts whole lot Pain Location: complaints of most pain with urination during session Pain Descriptors / Indicators: Grimacing Pain Intervention(s): Limited activity within patient's tolerance;Repositioned;Monitored during session  Home Living Family/patient expects to be discharged to:: Private residence Living Arrangements: Alone Available Help at Discharge: Family Type of Home: House Home Access: Stairs to enter Entrance Stairs-Rails: Psychiatric nurse of Steps: 3 Home Layout: One  level Home Equipment: None Additional Comments: Pt lives alone, states that her daughter lives near by and works during day. Pt reported she will check with her son about additional supervision.    Prior Function Level of Independence: Independent         Comments: Pt reports history of 2 falls within the year but unable to recall how long ago. states they occured close together.     Hand Dominance        Extremity/Trunk Assessment   Upper Extremity Assessment Upper Extremity Assessment: Defer to OT evaluation    Lower Extremity Assessment Lower Extremity Assessment: Generalized weakness    Cervical / Trunk Assessment Cervical / Trunk Assessment: Kyphotic  Communication   Communication: No difficulties  Cognition Arousal/Alertness: Awake/alert Behavior During Therapy: WFL for tasks assessed/performed Overall Cognitive Status: Within Functional Limits for tasks assessed                                 General Comments: Pt answering all questions, with grimacing/groaning in pain      General Comments      Exercises     Assessment/Plan    PT Assessment Patient needs continued PT services  PT Problem List Decreased strength;Decreased activity tolerance;Decreased balance;Decreased mobility;Pain       PT Treatment Interventions DME instruction;Gait training;Stair training;Functional mobility training;Therapeutic activities;Therapeutic exercise;Patient/family education    PT Goals (Current goals can be found in the Care Plan section)  Acute Rehab PT Goals Patient Stated Goal: Have less pain PT Goal Formulation: With patient Time For Goal Achievement: 09/24/20 Potential to Achieve Goals: Good    Frequency Min 3X/week   Barriers to discharge        Co-evaluation               AM-PAC PT "6 Clicks" Mobility  Outcome Measure Help needed turning from your back to your side while in a flat bed without using bedrails?: A Little Help needed  moving from lying on your back to sitting on the side of a flat bed without using bedrails?: A Little Help needed moving to and from a bed to a chair (including a wheelchair)?: A Lot Help needed standing up from a chair using your arms (e.g., wheelchair or bedside chair)?: A Lot Help needed to walk in hospital room?: A Lot Help needed climbing 3-5 steps with a railing? : Total 6 Click Score: 13    End of Session Equipment Utilized During Treatment: Gait belt Activity Tolerance: Patient tolerated treatment well;Patient limited by fatigue Patient left: in chair;with call bell/phone within reach;with chair alarm set;with nursing/sitter in room Nurse Communication: Mobility status (pt appropriate to transfer with +2 assist to [facility] vs. use of purewick) PT Visit Diagnosis: Unsteadiness on feet (R26.81);Muscle weakness (generalized) (M62.81);History of falling (Z91.81)    Time: [id number] PT Time Calculation (min) (ACUTE ONLY): 21 min   Charges:   PT Evaluation $PT Eval Low Complexity: 1 Low          Festus Barren PT, DPT  Acute Rehabilitation Services  Office [phone]  09/10/2020, 12:17 PM

## 2020-09-10 NOTE — TOC Initial Note (Signed)
Transition of Care [facility]) - Initial/Assessment Note    Patient Details  Name: Michelle [name] MRN: [id number] Date of Birth: 1930/02/25  Transition of Care [facility]) CM/SW Contact:    Levent Kornegay, Marjie Skiff, RN Phone Number: 09/10/2020, 3:33 PM  Clinical Narrative:                 Spoke with pt, son [name] and daughter at bedside for dc planning. Pt from home. Her daughter has been staying with her currently and will stay with her at dc. Physical therapy recommendations gone over with family. They decline SNF and state that someone will be with her 24hrs a day. They are working with a long term care policy for personal care services. Choice offered for home health services and [facility] chosen. [facility] liaison given referral. 3in1 requested and ordered from [place].  Expected Discharge Plan: [place] Barriers to Discharge: Continued Medical Work up   Patient Goals and CMS Choice Patient states their goals for this hospitalization and ongoing recovery are:: To go home      Expected Discharge Plan and Services Expected Discharge Plan: [place]   Discharge Planning Services: CM Consult                     DME Arranged: 3-N-1 DME Agency: [employer] Date DME Agency Contacted: 09/10/20 Time DME Agency Contacted: 27   [place]: RN, PT, Nurse's Aide, OT HH Agency: [facility] Date [facility] Agency Contacted: 09/10/20 Time HH Agency Contacted: 1528 Representative spoke with at [place]: [place] Arrangements/Services   Lives with:: Adult Children Patient language and need for interpreter reviewed:: Yes        Need for Family Participation in Patient Care: Yes (Comment) Care giver support system in place?: Yes (comment)   Criminal Activity/Legal Involvement Pertinent to Current Situation/Hospitalization: No - Comment as needed  Activities of Daily Living Home Assistive Devices/Equipment: Eyeglasses ADL Screening (condition at time  of admission) Patient's cognitive ability adequate to safely complete daily activities?: No Is the patient deaf or have difficulty hearing?: No Does the patient have difficulty seeing, even when wearing glasses/contacts?: No Does the patient have difficulty concentrating, remembering, or making decisions?: Yes (trouble with short term memory and trouble concentrating) Patient able to express need for assistance with ADLs?: Yes Does the patient have difficulty dressing or bathing?: Yes Independently performs ADLs?: No Communication: Independent Dressing (OT): Needs assistance Is this a change from baseline?: Pre-admission baseline Grooming: Needs assistance Is this a change from baseline?: Pre-admission baseline Feeding: Needs assistance Is this a change from baseline?: Pre-admission baseline Bathing: Needs assistance Is this a change from baseline?: Pre-admission baseline Toileting: Needs assistance Is this a change from baseline?: Pre-admission baseline In/Out Bed: Needs assistance Is this a change from baseline?: Pre-admission baseline Walks in Home: Needs assistance Is this a change from baseline?: Pre-admission baseline Does the patient have difficulty walking or climbing stairs?: Yes Weakness of Legs: Both Weakness of Arms/Hands: Both  Permission Sought/Granted   Permission granted to share information with : Yes, Verbal Permission Granted     Permission granted to share info w AGENCY: [facility]        Emotional Assessment Appearance:: Appears stated age Attitude/Demeanor/Rapport: Gracious Affect (typically observed): Stable Orientation: : Oriented to Self, Oriented to Place      Admission diagnosis:  Acute UTI [N39.0] Sepsis secondary to UTI ([place]) [A41.9, N39.0] Sepsis with acute organ dysfunction without septic  shock, due to unspecified organism, unspecified type ([place]) [A41.9, R65.20] Patient Active Problem List   Diagnosis Date Noted   Thrombocytopenia ([place])  09/07/2020   Anemia 09/07/2020   Sepsis secondary to UTI ([place]) 09/07/2020   Malignant neoplasm of overlapping sites of right breast in female, estrogen receptor positive ([place]) 05/28/2019   Malignant neoplasm of upper-outer quadrant of left breast in female, estrogen receptor positive ([place]) 05/27/2019   Hypertension    Arthritis    Near syncope 07/11/2014   Syncope 07/11/2014   PULMONARY FIBROSIS 02/12/2007   GERD 11/30/2006   COUGH, CHRONIC 11/30/2006   PCP:  [name], MD Pharmacy:   [place], [place] [address] Phone: [phone] Fax: [phone]  [facility] - [place], [place] [address] Phone: [phone] Fax: [phone]  [place] [address] Phone: [phone] Fax: [phone]     Social Determinants of Health (SDOH) Interventions    Readmission Risk Interventions No flowsheet data found.

## 2020-09-10 NOTE — Progress Notes (Signed)
HEMATOLOGY-ONCOLOGY PROGRESS NOTE  SUBJECTIVE:  Tolerated transfusions yesterday w/o event; this AM c/o pain in her shoulders; has not requested tylenol, which usually works for her she says; had BM yesterday; was not able to tell me how much she was OOB yesterday; no family in room  Oncology History  Malignant neoplasm of upper-outer quadrant of left breast in female, estrogen receptor positive ([place])  05/17/2019 Cancer Staging   Staging form: Breast, AJCC 8th Edition - Clinical stage from 05/17/2019: Stage IIIA (cT3, cN1, cM0, G2, ER+, PR-, HER2-) - Signed by [place] Phlegm, NP on 05/29/2019    05/27/2019 Initial Diagnosis   Malignant neoplasm of upper-outer quadrant of left breast in female, estrogen receptor positive ([place])    Malignant neoplasm of overlapping sites of right breast in female, estrogen receptor positive ([place])  05/17/2019 Cancer Staging   Staging form: Breast, AJCC 8th Edition - Clinical stage from 05/17/2019: Stage IIA (cT2, cN0, cM0, G1, ER+, PR-, HER2-) - Signed by [place] Phlegm, NP on 05/29/2019    05/28/2019 Initial Diagnosis   Malignant neoplasm of overlapping sites of right breast in female, estrogen receptor positive ([place])      Past Medical History:  Diagnosis Date   Arthritis    Cancer ([place])    Bilaterial Breast Cancer   Dyspnea    GERD (gastroesophageal reflux disease)    Hypertension    Stroke ([place])    "mini stroke" per patient   Past Surgical History:  Procedure Laterality Date   ABDOMINAL HYSTERECTOMY     APPENDECTOMY     SHOULDER SURGERY     Right   Family History  Adopted: Yes    GYNECOLOGIC HISTORY:  No LMP recorded. Patient is postmenopausal. Menarche: Does not recall Age at first live birth: 87 years old [place] P 4 LMP 29s Hysterectomy?  Yes BSO?  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  Her  children are [name], who lives in [place] and works for [employer] [name]) lives in [place] and runs his own business, and [name]") who lives in [place] and works for [employer]  He is also training his children to box.  Son [name] died from hepatitis C at age 83.  The patient has 11 grandchildren and more great grandchildren and great great-grandchildren than she can count.  She attends a local [place]: Not in place.  At the 05/28/2019 visit the patient was given the appropriate documents to complete and notarized at her discretion.  PHYSICAL EXAMINATION: ECOG PERFORMANCE STATUS: 2 - Symptomatic, <50% confined to bed  Vitals:   09/10/20 0053 09/10/20 0340  BP: (!) 114/52 (!) 128/57  Pulse: 74 71  Resp: 18 15  Temp: 98.4 F (36.9 C) 98.3 F (36.8 C)  SpO2: 99% 98%   Filed Weights   09/07/20 2156 09/08/20 0840  Weight: 104 lb (47.2 kg) 103 lb 13.4 oz (47.1 kg)    Intake/Output from previous day: 07/27 0701 - 07/28 0700 In: 1682.2 [P.O.:240; I.V.:282.8; Blood:1059.3; IV Piggyback:100] Out: 1450 [Urine:1450]    LABORATORY DATA:  I have reviewed the data as listed CMP Latest Ref Rng & Units 09/10/2020 09/09/2020 09/09/2020  Glucose 70 - 99 mg/dL 216(H) -  102(H)  BUN 8 - 23 mg/dL 17 - 16  Creatinine 0.44 - 1.00 mg/dL 0.70 - 0.65  Sodium 135 - 145 mmol/L 135 - 136  Potassium 3.5 - 5.1 mmol/L 4.0 - 3.8  Chloride 98 - 111 mmol/L 101 - 99  CO2 22 - 32 mmol/L 28 - 27  Calcium 8.9 - 10.3 mg/dL 8.4(L) - 8.6(L)  Total Protein 6.5 - 8.1 g/dL 5.5(L) - 5.0(L)  Total Bilirubin 0.3 - 1.2 mg/dL 0.8 1.0 0.9  Alkaline Phos 38 - 126 U/L 72 - 57  AST 15 - 41 U/L 26 - 26  ALT 0 - 44 U/L 22 - 18    Lab Results  Component Value Date   WBC 4.9 09/10/2020   HGB 10.1 (L) 09/10/2020   HCT 29.9 (L) 09/10/2020   MCV 89.5 09/10/2020   PLT 27 (LL) 09/10/2020   NEUTROABS 4.4 09/09/2020    DG Chest Port 1 View  Result Date:  09/07/2020 CLINICAL DATA:  Questionable sepsis - evaluate for abnormality EXAM: PORTABLE CHEST 1 VIEW COMPARISON:  Radiograph 07/05/2012, chest CT 06/07/2019 FINDINGS: Unchanged cardiomediastinal silhouette. Aortic arch calcifications. There is no focal airspace disease. There is no large pleural effusion or visible pneumothorax. There is no acute osseous abnormality. Dextroconvex curvature of the spine. IMPRESSION: No focal airspace consolidation. Electronically Signed   By: Maurine Simmering   On: 09/07/2020 22:01    ASSESSMENT:  A: Breast Cancer history:  87 y.o. [place] woman status post bilateral breast biopsies on 05/17/2019 as follows:             (a) on the left upper outer quadrant, a clinical T2 N2, stage IIIA invasive ductal carcinoma,grade 2 or 3, estrogen receptor positive, progesterone receptor negative, with no HER-2 amplification and an MIB-1 of 12%             (b) on the right overlapping sites, 2 morphologically distinct invasive ductal carcinomas, both clinically T1c N0, stage IA, grade 1 or 2, estrogen receptor positive, progesterone receptor negative, with no HER-2 amplification and an MIB-1 of 5%             (c) CT of the chest abdomen and pelvis 06/10/2019 shows left axillary, retropectoral and supraclavicular lymph nodes, and a 4.1 cm left breast mass; no right axillary adenopathy; 0.6 cm nonspecific left lower lobe pulmonary nodule; no liver or bone lesions.             (d) bone scan 06/26/2019--no evidence of skeletal metastases                          (1) neoadjuvant anastrozole started 05/28/2019, palbociclib added 05/30/2019 125 mg a day, 21 on 7 off             (a) palbociclib dose decreased cycle 2 (May 2021) to 100 mg daily, 21/7             (b) bilateral breast and axilla ultrasonography 10/04/2019 shows a good initial response to treatment             (c) repeat bilateral ultrasonography 046 2022 continues to show evidence of response   (2) genetics testing declined    (3) definitive surgery to follow   (4) adjuvant radiation to follow  B: severe thrombocytopenia: TTP? ITP? Review of blood film does show some schistocytes but they are rare (<1/HPF); the PLASMIC score suggests low/intermediate likely hood of TTP. It is difficult to separate  sepsis/ TTP/ITP. Given all the above, I will not proceed to plasma exchange but have written for her to receive FFP 07/27 and 07/28. I also wrote for IVIG today, and if she does have ITP we should expect resolution of the thrombocytopenia in 5-10 days  Will follow with you    LOS: 3 days   Chauncey Cruel, DNP, AGPCNP-BC, AOCNP 09/10/20   Chauncey Cruel, MD Medical Oncology and Hematology [facility] [place], [place] [address] Tel. [phone]    Fax. [phone]

## 2020-09-11 ENCOUNTER — Other Ambulatory Visit: Payer: Self-pay

## 2020-09-11 ENCOUNTER — Other Ambulatory Visit: Payer: Self-pay | Admitting: Adult Health

## 2020-09-11 DIAGNOSIS — D693 Immune thrombocytopenic purpura: Secondary | ICD-10-CM | POA: Insufficient documentation

## 2020-09-11 DIAGNOSIS — A419 Sepsis, unspecified organism: Secondary | ICD-10-CM | POA: Diagnosis not present

## 2020-09-11 DIAGNOSIS — N390 Urinary tract infection, site not specified: Secondary | ICD-10-CM | POA: Diagnosis not present

## 2020-09-11 LAB — COMPREHENSIVE METABOLIC PANEL
ALT: 33 U/L (ref 0–44)
AST: 42 U/L — ABNORMAL HIGH (ref 15–41)
Albumin: 2.3 g/dL — ABNORMAL LOW (ref 3.5–5.0)
Alkaline Phosphatase: 66 U/L (ref 38–126)
Anion gap: 7 (ref 5–15)
BUN: 18 mg/dL (ref 8–23)
CO2: 27 mmol/L (ref 22–32)
Calcium: 8.2 mg/dL — ABNORMAL LOW (ref 8.9–10.3)
Chloride: 100 mmol/L (ref 98–111)
Creatinine, Ser: 0.69 mg/dL (ref 0.44–1.00)
GFR, Estimated: >60 mL/min (ref >60)
Glucose, Bld: 102 mg/dL — ABNORMAL HIGH (ref 70–99)
Potassium: 4.2 mmol/L (ref 3.5–5.1)
Sodium: 134 mmol/L — ABNORMAL LOW (ref 135–145)
Total Bilirubin: 0.5 mg/dL (ref 0.3–1.2)
Total Protein: 6.4 g/dL — ABNORMAL LOW (ref 6.5–8.1)

## 2020-09-11 LAB — MAGNESIUM: Magnesium: 1.7 mg/dL (ref 1.7–2.4)

## 2020-09-11 LAB — BPAM RBC
Blood Product Expiration Date: 202208272359
Blood Product Expiration Date: 202208272359
ISSUE DATE / TIME: 202207271619
ISSUE DATE / TIME: 202207280033
Unit Type and Rh: 5100
Unit Type and Rh: 5100

## 2020-09-11 LAB — CBC
HCT: 30.2 % — ABNORMAL LOW (ref 36.0–46.0)
Hemoglobin: 10.1 g/dL — ABNORMAL LOW (ref 12.0–15.0)
MCH: 30.6 pg (ref 26.0–34.0)
MCHC: 33.4 g/dL (ref 30.0–36.0)
MCV: 91.5 fL (ref 80.0–100.0)
Platelets: 38 10*3/uL — ABNORMAL LOW (ref 150–400)
RBC: 3.3 MIL/uL — ABNORMAL LOW (ref 3.87–5.11)
RDW: 18.5 % — ABNORMAL HIGH (ref 11.5–15.5)
WBC: 5.3 10*3/uL (ref 4.0–10.5)
nRBC: 0 % (ref 0.0–0.2)

## 2020-09-11 LAB — TYPE AND SCREEN
ABO/RH(D): O POS
Antibody Screen: NEGATIVE
Unit division: 0
Unit division: 0

## 2020-09-11 MED ORDER — PREDNISONE 20 MG PO TABS
40.0000 mg | ORAL_TABLET | Freq: Every day | ORAL | Status: DC
  Administered 2020-09-11 – 2020-09-16 (×6): 40 mg via ORAL
  Filled 2020-09-11 (×6): qty 2

## 2020-09-11 MED ORDER — SODIUM CHLORIDE 0.9 % IV SOLN
2.0000 g | Freq: Four times a day (QID) | INTRAVENOUS | Status: DC
  Administered 2020-09-11 – 2020-09-13 (×6): 2 g via INTRAVENOUS
  Filled 2020-09-11 (×3): qty 2
  Filled 2020-09-11: qty 2000

## 2020-09-11 MED ORDER — PHENAZOPYRIDINE HCL 100 MG PO TABS: 100.0000 mg | ORAL_TABLET | Freq: Three times a day (TID) | ORAL | Status: DC

## 2020-09-11 MED ORDER — SODIUM CHLORIDE 0.9 % IV SOLN
2.0000 g | Freq: Four times a day (QID) | INTRAVENOUS | Status: DC
  Filled 2020-09-11 (×2): qty 2000

## 2020-09-11 NOTE — Progress Notes (Signed)
Nplate ordered for patients ITP (see inpatient progress notes for details).    Wilber Bihari, NP

## 2020-09-11 NOTE — Progress Notes (Signed)
HEMATOLOGY-ONCOLOGY PROGRESS NOTE  SUBJECTIVE:  Michelle [name] is doing moderately well this morning.  Michelle [name] got up yesterday and sat in Michelle chair while Michelle [name] received Michelle IVIG.  Michelle [name] tolerated this well.  Michelle [name] nurse noted that Michelle [name] arthritic pain was Michelle [name] main limitation in mobility, however once Michelle [name] took Michelle tramadol it greatly improved Michelle [name] arthritis and Michelle [name] was able to be more functional.  Michelle [name] has not had any easy bruising or bleeding.  Michelle [name] ADAMST13 was normal, and Michelle [name] platelet count has increased to 38 today.  Michelle [name] tells me Michelle main thing Michelle [name] wants to do is sleep  Oncology History  Malignant neoplasm of upper-outer quadrant of left breast in female, estrogen receptor positive ([place])  05/17/2019 Cancer Staging   Staging form: Breast, AJCC 8th Edition - Clinical stage from 05/17/2019: Stage IIIA (cT3, cN1, cM0, G2, ER+, PR-, HER2-) - Signed by [place] Phlegm, NP on 05/29/2019   05/27/2019 Initial Diagnosis   Malignant neoplasm of upper-outer quadrant of left breast in female, estrogen receptor positive ([place])   Malignant neoplasm of overlapping sites of right breast in female, estrogen receptor positive ([place])  05/17/2019 Cancer Staging   Staging form: Breast, AJCC 8th Edition - Clinical stage from 05/17/2019: Stage IIA (cT2, cN0, cM0, G1, ER+, PR-, HER2-) - Signed by [place] Phlegm, NP on 05/29/2019   05/28/2019 Initial Diagnosis   Malignant neoplasm of overlapping sites of right breast in female, estrogen receptor positive ([place])     Past Medical History:  Diagnosis Date   Arthritis    Cancer ([place])    Bilaterial Breast Cancer   Dyspnea    GERD (gastroesophageal reflux disease)    Hypertension    Stroke ([place])    "mini stroke" per [name]   Past Surgical History:  Procedure Laterality Date   ABDOMINAL HYSTERECTOMY     APPENDECTOMY     SHOULDER SURGERY     Right   Family History  Adopted: Yes    GYNECOLOGIC HISTORY:  No LMP recorded. [name] is postmenopausal. Menarche: Does not  recall Age at first live birth: 87 years old Michelle [name] 4 LMP 50s Hysterectomy?  Yes BSO?  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  Michelle [name] children are Michelle [name], who lives in [place] and works for [employer] Michelle [name]) lives in [place] and runs his own business, and Michelle [name]") who lives in [place] and works for [employer]  He is also training his children to box.  Son Michelle [name] died from hepatitis C at age 13.  Michelle [name] has 11 grandchildren and more great grandchildren and great great-grandchildren than Michelle [name] can count.  Michelle [name] attends a local [place]: Not in place.  At Michelle 05/28/2019 visit Michelle [name] was given Michelle appropriate documents to complete and notarized at Michelle [name] discretion.  PHYSICAL EXAMINATION: ECOG PERFORMANCE STATUS: 2 - Symptomatic, <50% confined to bed  Vitals:   09/10/20 2120 09/11/20 0518  BP: 121/65 (!) 134/57  Pulse: 77 82  Resp: 18 20  Temp: (!) 97.5 F (36.4 C) 98.2 F (36.8 C)  SpO2: 99% 98%   Filed Weights   09/07/20 2156 09/08/20 0840  Weight: 104 lb (47.2 kg) 103 lb 13.4 oz (47.1 kg)  GENERAL: [name] is lying in bed, eyes closed, and appears well, and sleepy HEENT:  Sclerae anicteric.  Oropharynx clear and moist. No ulcerations or evidence of oropharyngeal candidiasis. Neck is supple.  NODES:  No cervical, supraclavicular, or axillary lymphadenopathy palpated.  LUNGS:  Clear to auscultation bilaterally.  No wheezes or rhonchi. HEART:  Regular rate and rhythm. No murmur appreciated. ABDOMEN:  Soft, nontender.  Positive, normoactive bowel sounds. No organomegaly palpated. EXTREMITIES:  No peripheral edema.   SKIN:  Clear with no obvious rashes or skin changes. No nail dyscrasia. NEURO:  Nonfocal. Well oriented.  Appropriate affect.   Intake/Output from previous day: 07/28 0701 - 07/29  0700 In: 480 [[name].O.:480] Out: 700 [Urine:700]    LABORATORY DATA:  I have reviewed Michelle data as listed CMP Latest Ref Rng & Units 09/11/2020 09/10/2020 09/09/2020  Glucose 70 - 99 mg/dL 102(H) 216(H) -  BUN 8 - 23 mg/dL 18 17 -  Creatinine 0.44 - 1.00 mg/dL 0.69 0.70 -  Sodium 135 - 145 mmol/L 134(L) 135 -  Potassium 3.5 - 5.1 mmol/L 4.2 4.0 -  Chloride 98 - 111 mmol/L 100 101 -  CO2 22 - 32 mmol/L 27 28 -  Calcium 8.9 - 10.3 mg/dL 8.2(L) 8.4(L) -  Total Protein 6.5 - 8.1 g/dL 6.4(L) 5.5(L) -  Total Bilirubin 0.3 - 1.2 mg/dL 0.5 0.8 1.0  Alkaline Phos 38 - 126 U/L 66 72 -  AST 15 - 41 U/L 42(H) 26 -  ALT 0 - 44 U/L 33 22 -    Lab Results  Component Value Date   WBC 5.3 09/11/2020   HGB 10.1 (L) 09/11/2020   HCT 30.2 (L) 09/11/2020   MCV 91.5 09/11/2020   PLT 38 (L) 09/11/2020   NEUTROABS 4.4 09/09/2020    DG Chest Port 1 View  Result Date: 09/07/2020 CLINICAL DATA:  Questionable sepsis - evaluate for abnormality EXAM: PORTABLE CHEST 1 VIEW COMPARISON:  Radiograph 07/05/2012, chest CT 06/07/2019 FINDINGS: Unchanged cardiomediastinal silhouette. Aortic arch calcifications. There is no focal airspace disease. There is no large pleural effusion or visible pneumothorax. There is no acute osseous abnormality. Dextroconvex curvature of Michelle spine. IMPRESSION: No focal airspace consolidation. Electronically Signed   By: Maurine Simmering   On: 09/07/2020 22:01    ASSESSMENT:  A: Breast Cancer history:  87 y.o. [place] woman status post bilateral breast biopsies on 05/17/2019 as follows:             (a) on Michelle left upper outer quadrant, a clinical T2 N2, stage IIIA invasive ductal carcinoma,grade 2 or 3, estrogen receptor positive, progesterone receptor negative, with no Michelle [name]-2 amplification and an MIB-1 of 12%             (b) on Michelle right overlapping sites, 2 morphologically distinct invasive ductal carcinomas, both clinically T1c N0, stage IA, grade 1 or 2, estrogen receptor positive,  progesterone receptor negative, with no Michelle [name]-2 amplification and an MIB-1 of 5%             (c) CT of Michelle chest abdomen and pelvis 06/10/2019 shows left axillary, retropectoral and supraclavicular lymph nodes, and a 4.1 cm left breast mass; no right axillary adenopathy; 0.6 cm nonspecific left lower lobe pulmonary nodule; no liver or bone lesions.             (d) bone scan 06/26/2019--no evidence of skeletal metastases                          (  1) neoadjuvant anastrozole started 05/28/2019, palbociclib added 05/30/2019 125 mg a day, 21 on 7 off             (a) palbociclib dose decreased cycle 2 (May 2021) to 100 mg daily, 21/7             (b) bilateral breast and axilla ultrasonography 10/04/2019 shows a good initial response to treatment             (c) repeat bilateral ultrasonography 046 2022 continues to show evidence of response   (2) genetics testing declined   (3) definitive surgery to follow   (4) adjuvant radiation to follow  B: severe thrombocytopenia:  ADAMST13 is normal which rules 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  I started Michelle [name] on Prednisone 53m today.  Michelle [name] will receive Michelle [name] second IVIG dose today.  I spoke with Michelle [name] fantastic nurse JBroadus Johnwho is doing a great job getting BErikahout of bed and moving around.  I encouraged Michelle [name] to do this again today.    If Michelle [name]'s platelets are above 50 tomorrow Michelle [name] can be discharged.  I am going to arrange for f/u with me on Thursday in clinic with N-[facility]ate.  Please write for Michelle [name] to have tramadol upon discharge as this helps greatly with Michelle [name] mobility and arthritic pain.    Michelle above was discussed with Dr. MJana Hakimin detail who is in agreement.   Will follow with you    LOS: 4 days     LScot Dock NP Medical Oncology and Hematology [facility] [address]Tel. [phone]   Fax. [phone]

## 2020-09-11 NOTE — Progress Notes (Signed)
PROGRESS NOTE    Michelle [name]  [id number] DOB: 1930/02/25 DOA: 09/07/2020 PCP: [name], MD    Brief Narrative:  Michelle [name] is an 87 year old female with past medical history significant for breast cancer on Ibrance, essential hypertension who presented to [facility] ED on 7/25 with progressive weakness and burning with urination.  She reports feeling unwell for the last week and now bedbound for the last few days with associated body aches, chest wall pain, cough, nausea and dysuria.  Denies fever, no vomiting/diarrhea or abdominal pain.  In the ED, temperature 101.9 F, HR 102, RR 23, BP 128/64, SPO2 99% on room air.  Sodium 135, potassium 3.4, chloride 95, CO2 23, glucose 106, BUN 21, creatinine 0.91.  Anion gap 17.  Total bilirubin 1.5.  Lactic acid 1.2.  WBC 6.2, hemoglobin 9.2, platelets 31.  Procalcitonin 1.17.  PT 14.9, INR 1.2.  Influenza A/B PCR and COVID-19 PCR negative.  Urinalysis with large leukocytes, positive nitrite, many bacteria, greater than 50 WBCs.  Blood cultures were obtained.  Patient was started on empiric antibiotics.  TRH consulted for further evaluation and management of sepsis secondary to UTI with associated thrombocytopenia concerning for DIC versus chemotherapy induced.   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  On admission, patient was noted to have a fever, tachypnea, tachycardia, elevated procalcitonin, elevated total bilirubin, and significant thrombocytopenia.  Urinalysis consistent with UTI.  Urine culture with multiple species present. --Blood  cultures positive for Aerocuccous urinae --Ceftriaxone transitioned to ampicillin 2g IV q6h following culture identification --LR 75 mL per hour --Continue monitor fever curve --Supportive care, antipyretics, antiemetics  Thrombocytopenia 2/2 ITP Anemia On admission patient's platelet level 31.  Now is trended down to 22.  Initially concern for possible DIC vs TTP vs ITP vs sepsis induced.  D-dimer elevated 16.13 with normal fibrinogen 436 and normal PT of 14.3.  Blood smear with schistocytes noted.  ADAMTS13 normal.  --Medical oncology following, appreciate assistance  --Plt 31>>22>23>28>38 (baseline 264 on 05/27/20) --Holding home Ibrance --s/p2u pRBC and 4u FFP --s/p IVIG 7/28; repeating today --prednisone '40mg'$  PO daily --plan to receive NPLATE on Thurday [id number] --Continue to monitor CBC daily  Hx breast cancer on chemotherapy Patient follows with medical oncology, Dr. [name] outpatient.  Currently on Ibrance. --Oncology following as above --Holding Ibrance  Essential hypertension BP 134/57 this morning.  Home regimen includes losartan-HCTZ 100-12.5 mg p.o. daily. --Continue to hold home antihypertensives giving low blood pressure on admission --Continue monitor BP closely  Hypokalemia Potassium 4.0 this morning with magnesium 2.0  --Repeat electrolytes in a.m.  Osteoarthritis --Started on tramadol with much improvement of pain symptoms  HLD: On pravastatin outpatient, will hold for now.  Weakness/fatigue/deconditioning: --PT/OT recommending SNF --TOC for placement  Severe protein calorie malnutrition Body mass index is 19.62 kg/m.  Patient with cachexia, fat depletion, muscle loss likely in the setting of malignancy on chemotherapy. Nutrition Status: Nutrition Problem: Increased nutrient needs Etiology: acute illness Signs/Symptoms: estimated needs Interventions: Ensure Enlive (each supplement provides 350kcal and 20 grams of protein), MVI --Dietitian  following   DVT prophylaxis: SCDs Start:  09/07/20 2328   Code Status: Full Code Family Communication: No family present at bedside this morning.  Attempted update patient's daughter [name] via telephone unsuccessful as phone went straight to voicemail and mailbox full.  Disposition Plan:  Level of care: Telemetry Status is: Inpatient  Remains inpatient appropriate because:Ongoing diagnostic testing needed not appropriate for outpatient work up, Unsafe d/c plan, IV treatments appropriate due to intensity of illness or inability to take PO, and Inpatient level of care appropriate due to severity of illness  Dispo: The patient is from: Home              Anticipated d/c is to:  To be determined              Patient currently is not medically stable to d/c.   Difficult to place patient No   Consultants:  Medical oncology  Procedures:  None  Antimicrobials:  Ceftriaxone 7/25 - 7/28 Ampicillin 7/29>>   Subjective: Patient seen examined at bedside, resting comfortably.  Sleeping but easily arousable.  Continues with weakness and fatigue.  Pain to feet improved with tramadol.  Seen by medical oncology this morning, starting prednisone, receiving second dose of IVIG with plans for Nplate next Thursday outpatient office.  No other complaints or concerns at this time.  Denies headache, no current fever/chills, no nausea/vomiting/diarrhea, no chest pain, no palpitations, no shortness of breath, no cough/congestion, no paresthesias.  No acute events overnight per nursing staff.    Objective: Vitals:   09/10/20 1426 09/10/20 1445 09/10/20 2120 09/11/20 0518  BP: 120/61 (!) 128/59 121/65 (!) 134/57  Pulse: 80 87 77 82  Resp:   18 20  Temp: 97.7 F (36.5 C) 97.7 F (36.5 C) (!) 97.5 F (36.4 C) 98.2 F (36.8 C)  TempSrc: Oral Oral Oral Oral  SpO2: 99% 99% 99% 98%  Weight:      Height:        Intake/Output Summary (Last 24 hours) at 09/11/2020 1151 Last data filed at 09/11/2020  1100 Gross per 24 hour  Intake 480 ml  Output 1250 ml  Net -770 ml   Filed Weights   09/07/20 2156 09/08/20 0840  Weight: 47.2 kg 47.1 kg    Examination:  General exam: Appears calm and comfortable appears thin/cachectic; chronically ill in appearance Respiratory system: Clear to auscultation. Respiratory effort normal.  On room air Cardiovascular system: S1 & S2 heard, RRR. No JVD, murmurs, rubs, gallops or clicks. No pedal edema. Gastrointestinal system: Abdomen is nondistended, soft and nontender. No organomegaly or masses felt. Normal bowel sounds heard. Central nervous system: Alert and oriented. No focal neurological deficits. Extremities: Symmetric 5 x 5 power. Skin: No rashes, lesions or ulcers Psychiatry: Judgement and insight appear normal. Mood & affect appropriate.     Data Reviewed: I have personally reviewed following labs and imaging studies  CBC: Recent Labs  Lab 09/07/20 2208 09/07/20 2325 09/08/20 0339 09/09/20 0604 09/09/20 1619 09/10/20 0600 09/11/20 0654  WBC 6.2  --  6.0 5.9 5.1 4.9 5.3  NEUTROABS 5.3  --   --   --  4.4  --   --   HGB 9.2*  --  8.4* 7.3* 7.5* 10.1* 10.1*  HCT 27.8*  --  24.5* 21.5* 21.9* 29.9* 30.2*  MCV 101.1*  --  99.2 97.7 97.8 89.5 91.5  PLT 31*   < > 22* 23* 28* 27* 38*   < > = values in this interval not displayed.   Basic Metabolic Panel: Recent  Labs  Lab 09/07/20 2208 09/08/20 0339 09/09/20 0604 09/10/20 0600 09/11/20 0654  NA 135 134* 136 135 134*  K 3.4* 3.3* 3.8 4.0 4.2  CL 95* 99 99 101 100  CO2 '23 25 27 28 27  '$ GLUCOSE 106* 107* 102* 216* 102*  BUN '21 20 16 17 18  '$ CREATININE 0.91 0.84 0.65 0.70 0.69  CALCIUM 9.2 8.7* 8.6* 8.4* 8.2*  MG  --  1.9 1.8 2.0 1.7   GFR: Estimated Creatinine Clearance: 36.1 mL/min (by C-G formula based on SCr of 0.69 mg/dL). Liver Function Tests: Recent Labs  Lab 09/07/20 2208 09/08/20 0339 09/09/20 0604 09/09/20 0832 09/10/20 0600 09/11/20 0654  AST '29 27 26  '$ --  26  42*  ALT '21 21 18  '$ --  22 33  ALKPHOS 67 61 57  --  72 66  BILITOT 1.5* 1.4* 0.9 1.0 0.8 0.5  PROT 6.5 5.7* 5.0*  --  5.5* 6.4*  ALBUMIN 3.0* 2.6* 2.4*  --  2.5* 2.3*   No results for input(s): LIPASE, AMYLASE in the last 168 hours. No results for input(s): AMMONIA in the last 168 hours. Coagulation Profile: Recent Labs  Lab 09/07/20 2208 09/07/20 2325 09/08/20 0339  INR 1.2 1.2 1.1   Cardiac Enzymes: No results for input(s): CKTOTAL, CKMB, CKMBINDEX, TROPONINI in the last 168 hours. BNP (last 3 results) No results for input(s): PROBNP in the last 8760 hours. HbA1C: No results for input(s): HGBA1C in the last 72 hours. CBG: No results for input(s): GLUCAP in the last 168 hours. Lipid Profile: No results for input(s): CHOL, HDL, LDLCALC, TRIG, CHOLHDL, LDLDIRECT in the last 72 hours. Thyroid Function Tests: No results for input(s): TSH, T4TOTAL, FREET4, T3FREE, THYROIDAB in the last 72 hours. Anemia Panel: Recent Labs    09/09/20 0739  RETICCTPCT 0.5   Sepsis Labs: Recent Labs  Lab 09/07/20 2208 09/08/20 0339  PROCALCITON  --  1.17  LATICACIDVEN 1.2  --     Recent Results (from the past 240 hour(s))  Blood Culture (routine x 2)     Status: None (Preliminary result)   Collection Time: 09/07/20 10:08 PM   Specimen: BLOOD LEFT FOREARM  Result Value Ref Range Status   Specimen Description   Final    BLOOD LEFT FOREARM Performed at [facility], [place] [address] [place], [place] [address]    Special Requests   Final    BOTTLES DRAWN AEROBIC AND ANAEROBIC Blood Culture results may not be optimal due to an inadequate volume of blood received in culture bottles Performed at [place] [address] [place], [place] [address]    Culture  Setup Time   Final    GRAM POSITIVE COCCI AEROBIC BOTTLE ONLY CRITICAL VALUE NOTED.  VALUE IS CONSISTENT WITH PREVIOUSLY REPORTED AND CALLED VALUE.    Culture   Final    GRAM POSITIVE  COCCI IDENTIFICATION TO FOLLOW REPEATING Performed at [facility], [place] [address], [place], [place] [address]    Report Status PENDING  Incomplete  Blood Culture (routine x 2)     Status: Abnormal   Collection Time: 09/07/20 10:08 PM   Specimen: BLOOD LEFT WRIST  Result Value Ref Range Status   Specimen Description   Final    BLOOD LEFT WRIST Performed at [place] [address], [place], [place] [address]    Special Requests   Final    BOTTLES DRAWN AEROBIC AND ANAEROBIC Blood Culture results may  not be optimal due to an inadequate volume of blood received in culture bottles Performed at [place] [address] [place], [place] [address]    Culture  Setup Time   Final    GRAM POSITIVE COCCI IN CLUSTERS IN BOTH AEROBIC AND ANAEROBIC BOTTLES CRITICAL RESULT CALLED TO, READ BACK BY AND VERIFIED WITH: PHARMD M.TECKER AT [id number] ON 09/09/2020 BY T.SAAD Performed at [facility], [place] [address], [place], [place] [address]    Culture AEROCOCCUS URINAE (A)  Final   Report Status 09/11/2020 FINAL  Final  Blood Culture ID Panel (Reflexed)     Status: None   Collection Time: 09/07/20 10:08 PM  Result Value Ref Range Status   Enterococcus faecalis NOT DETECTED NOT DETECTED Final   Enterococcus Faecium NOT DETECTED NOT DETECTED Final   Listeria monocytogenes NOT DETECTED NOT DETECTED Final   Staphylococcus species NOT DETECTED NOT DETECTED Final   Staphylococcus aureus (BCID) NOT DETECTED NOT DETECTED Final   Staphylococcus epidermidis NOT DETECTED NOT DETECTED Final   Staphylococcus lugdunensis NOT DETECTED NOT DETECTED Final   Streptococcus species NOT DETECTED NOT DETECTED Final   Streptococcus agalactiae NOT DETECTED NOT DETECTED Final   Streptococcus pneumoniae NOT DETECTED NOT DETECTED Final   Streptococcus pyogenes NOT DETECTED NOT DETECTED Final   A.calcoaceticus-baumannii NOT DETECTED NOT DETECTED Final   Bacteroides fragilis NOT  DETECTED NOT DETECTED Final   Enterobacterales NOT DETECTED NOT DETECTED Final   Enterobacter cloacae complex NOT DETECTED NOT DETECTED Final   Escherichia coli NOT DETECTED NOT DETECTED Final   Klebsiella aerogenes NOT DETECTED NOT DETECTED Final   Klebsiella oxytoca NOT DETECTED NOT DETECTED Final   Klebsiella pneumoniae NOT DETECTED NOT DETECTED Final   Proteus species NOT DETECTED NOT DETECTED Final   Salmonella species NOT DETECTED NOT DETECTED Final   Serratia marcescens NOT DETECTED NOT DETECTED Final   Haemophilus influenzae NOT DETECTED NOT DETECTED Final   Neisseria meningitidis NOT DETECTED NOT DETECTED Final   Pseudomonas aeruginosa NOT DETECTED NOT DETECTED Final   Stenotrophomonas maltophilia NOT DETECTED NOT DETECTED Final   Candida albicans NOT DETECTED NOT DETECTED Final   Candida auris NOT DETECTED NOT DETECTED Final   Candida glabrata NOT DETECTED NOT DETECTED Final   Candida krusei NOT DETECTED NOT DETECTED Final   Candida parapsilosis NOT DETECTED NOT DETECTED Final   Candida tropicalis NOT DETECTED NOT DETECTED Final   Cryptococcus neoformans/gattii NOT DETECTED NOT DETECTED Final    Comment: Performed at [facility], [address], [place], [place] [address]  Resp Panel by RT-PCR (Flu A&B, Covid) Nasopharyngeal Swab     Status: None   Collection Time: 09/07/20 10:10 PM   Specimen: Nasopharyngeal Swab; Nasopharyngeal(NP) swabs in vial transport medium  Result Value Ref Range Status   SARS Coronavirus 2 by RT PCR NEGATIVE NEGATIVE Final    Comment: (NOTE) SARS-CoV-2 target nucleic acids are NOT DETECTED.  The SARS-CoV-2 RNA is generally detectable in upper respiratory specimens during the acute phase of infection. The lowest concentration of SARS-CoV-2 viral copies this assay can detect is 138 copies/mL. A negative result does not preclude SARS-Cov-2 infection and should not be used as the sole basis for treatment or other patient management decisions.  A negative result may occur with  improper specimen collection/handling, submission of specimen other than nasopharyngeal swab, presence of viral mutation(s) within the areas targeted by this assay, and inadequate number of viral copies(<138 copies/mL). A negative result must be combined with  clinical observations, patient history, and epidemiological information. The expected result is Negative.  Fact Sheet for Patients:  EntrepreneurPulse.com.au  Fact Sheet for Healthcare Providers:  IncredibleEmployment.be  This test is no t yet approved or cleared by the [place] FDA and  has been authorized for detection and/or diagnosis of SARS-CoV-2 by FDA under an Emergency Use Authorization (EUA). This EUA will remain  in effect (meaning this test can be used) for the duration of the COVID-19 declaration under Section 564(b)(1) of the Act, 21 U.S.C.section 360bbb-3(b)(1), unless the authorization is terminated  or revoked sooner.       Influenza A by PCR NEGATIVE NEGATIVE Final   Influenza B by PCR NEGATIVE NEGATIVE Final    Comment: (NOTE) The Xpert Xpress SARS-CoV-2/FLU/RSV plus assay is intended as an aid in the diagnosis of influenza from Nasopharyngeal swab specimens and should not be used as a sole basis for treatment. Nasal washings and aspirates are unacceptable for Xpert Xpress SARS-CoV-2/FLU/RSV testing.  Fact Sheet for Patients: EntrepreneurPulse.com.au  Fact Sheet for Healthcare Providers: IncredibleEmployment.be  This test is not yet approved or cleared by the [place] FDA and has been authorized for detection and/or diagnosis of SARS-CoV-2 by FDA under an Emergency Use Authorization (EUA). This EUA will remain in effect (meaning this test can be used) for the duration of the COVID-19 declaration under Section 564(b)(1) of the Act, 21 U.S.C. section 360bbb-3(b)(1), unless the authorization  is terminated or revoked.  Performed at [facility], [place] [address] [place], [place] [address]   Urine Culture     Status: Abnormal   Collection Time: 09/07/20 10:40 PM   Specimen: In/Out Cath Urine  Result Value Ref Range Status   Specimen Description   Final    IN/OUT CATH URINE Performed at [facility] [address] [place], [place] [address]    Special Requests   Final    NONE Performed at [facility], [place] [address], [place], [place] [address]    Culture MULTIPLE SPECIES PRESENT, SUGGEST RECOLLECTION (A)  Final   Report Status 09/09/2020 FINAL  Final         Radiology Studies: No results found.      Scheduled Meds:  Chlorhexidine Gluconate Cloth  6 each Topical Daily   feeding supplement  237 mL Oral BID BM   multivitamin with minerals  1 tablet Oral Daily   predniSONE  40 mg Oral Q breakfast   Continuous Infusions:  ampicillin (OMNIPEN) IV     lactated ringers Stopped (09/08/20 1453)     LOS: 4 days    Time spent: 40 minutes spent on chart review, discussion with nursing staff, consultants, updating family and interview/physical exam; more than 50% of that time was spent in counseling and/or coordination of care.    Heavan Francom J [place] DO [facility] Available via Epic secure chat 7am-7pm After these hours, please refer to coverage provider listed on [email or web address] 09/11/2020, 11:51 AM

## 2020-09-11 NOTE — Progress Notes (Signed)
Occupational Therapy Treatment Patient Details Name: Michelle [name] MRN: [id number] DOB: 01-11-1931 Today's Date: 09/11/2020    History of present illness patient is a 87 year old female who presented to the hospital on 7/26 with shortness of breath and breast pain. patient was admitted for sepsis secondary to urinary tract infection and thrombocytopenia. [id number] cancer and HTN   OT comments  Patient asking "let me rest" provided education and encouragement that family is wanting to bring her home with [facility] services and will need to be able to assist them with self care, transfers. Patient needing mod A for bed mobility to support trunk, heavy mod A to power up to standing with cues for hand placement and mod A to ambulate ~23f to recliner chair. Patient keeping eyes closed despite cues to open, only able to take small shuffled steps and with posterior lean. Acute OT to follow.    Follow Up Recommendations  SNF (if family refuses will need 24/7 care + HH)    Equipment Recommendations  Other (comment) (defer to next venue)       Precautions / Restrictions Precautions Precautions: Fall       Mobility Bed Mobility Overal bed mobility: Needs Assistance Bed Mobility: Supine to Sit     Supine to sit: Mod assist;HOB elevated     General bed mobility comments: needs cues to inititate bringing legs to edge of bed, mod A to elevate trunk with bed pad    Transfers Overall transfer level: Needs assistance Equipment used: Rolling walker (2 wheeled) Transfers: Sit to/from Stand Sit to Stand: Mod assist         General transfer comment: heavy mod A to power up to standing, cues for hand placement to push up from bed    Balance Overall balance assessment: Needs assistance Sitting-balance support: Feet supported Sitting balance-Leahy Scale: Fair     Standing balance support: Bilateral upper extremity supported Standing balance-Leahy Scale: Poor Standing balance comment: use of  walker and mod A                           ADL either performed or assessed with clinical judgement   ADL Overall ADL's : Needs assistance/impaired                         Toilet Transfer: Moderate assistance;RW;Ambulation Toilet Transfer Details (indicate cue type and reason): to recliner, heavy mod A to power up to standing from edge of bed. Patient needing signficant time to take small shuffled steps to recliner ~451f patient needing mod A for balance, poor activity tolerance         Functional mobility during ADLs: Moderate assistance;Rolling walker;Cueing for sequencing;Cueing for safety General ADL Comments: poor activity tolerance "leave me alone"      Cognition Arousal/Alertness: Lethargic Behavior During Therapy: Flat affect Overall Cognitive Status: Within Functional Limits for tasks assessed                                 General Comments: patient keeping eyes closed needing cues/encouragement to participate                   Pertinent Vitals/ Pain       Pain Assessment: Faces Faces Pain Scale: Hurts little more Pain Location: does not specify Pain Descriptors / Indicators: Grimacing Pain Intervention(s): Monitored during session  Frequency  Min 2X/week        Progress Toward Goals  OT Goals(current goals can now be found in the care plan section)  Progress towards OT goals: Progressing toward goals  Acute Rehab OT Goals Patient Stated Goal: Have less pain OT Goal Formulation: With patient/family Time For Goal Achievement: 09/23/20 Potential to Achieve Goals: Fair ADL Goals Pt Will Perform Eating: with modified independence Pt Will Perform Grooming: with modified independence;sitting Pt Will Transfer to Toilet: with min guard assist;ambulating  Plan Discharge plan remains appropriate       AM-PAC OT "6 Clicks" Daily Activity     Outcome Measure   Help from another person eating meals?: A  Little Help from another person taking care of personal grooming?: A Little Help from another person toileting, which includes using toliet, bedpan, or urinal?: Total Help from another person bathing (including washing, rinsing, drying)?: A Lot Help from another person to put on and taking off regular upper body clothing?: A Lot Help from another person to put on and taking off regular lower body clothing?: Total 6 Click Score: 12    End of Session Equipment Utilized During Treatment: Rolling walker  OT Visit Diagnosis: Muscle weakness (generalized) (M62.81);Pain Pain - part of body:  (generalized)   Activity Tolerance Patient limited by fatigue   Patient Left in chair;with call bell/phone within reach;with chair alarm set   Nurse Communication Mobility status        Time: [id number] OT Time Calculation (min): 18 min  Charges: OT General Charges $OT Visit: 1 Visit OT Treatments $Self Care/Home Management : 8-22 mins  [place] OT OT pager: Rafael Hernandez 09/11/2020, 1:11 PM

## 2020-09-12 DIAGNOSIS — A419 Sepsis, unspecified organism: Secondary | ICD-10-CM | POA: Diagnosis not present

## 2020-09-12 DIAGNOSIS — C50412 Malignant neoplasm of upper-outer quadrant of left female breast: Secondary | ICD-10-CM | POA: Diagnosis not present

## 2020-09-12 DIAGNOSIS — R300 Dysuria: Secondary | ICD-10-CM

## 2020-09-12 DIAGNOSIS — R531 Weakness: Secondary | ICD-10-CM

## 2020-09-12 DIAGNOSIS — N390 Urinary tract infection, site not specified: Secondary | ICD-10-CM | POA: Diagnosis not present

## 2020-09-12 DIAGNOSIS — D638 Anemia in other chronic diseases classified elsewhere: Secondary | ICD-10-CM

## 2020-09-12 DIAGNOSIS — D693 Immune thrombocytopenic purpura: Secondary | ICD-10-CM | POA: Diagnosis not present

## 2020-09-12 DIAGNOSIS — I10 Essential (primary) hypertension: Secondary | ICD-10-CM

## 2020-09-12 DIAGNOSIS — E871 Hypo-osmolality and hyponatremia: Secondary | ICD-10-CM

## 2020-09-12 LAB — COMPREHENSIVE METABOLIC PANEL
ALT: 30 U/L (ref 0–44)
AST: 35 U/L (ref 15–41)
Albumin: 2.1 g/dL — ABNORMAL LOW (ref 3.5–5.0)
Alkaline Phosphatase: 70 U/L (ref 38–126)
Anion gap: 4 — ABNORMAL LOW (ref 5–15)
BUN: 17 mg/dL (ref 8–23)
CO2: 29 mmol/L (ref 22–32)
Calcium: 8.1 mg/dL — ABNORMAL LOW (ref 8.9–10.3)
Chloride: 99 mmol/L (ref 98–111)
Creatinine, Ser: 0.75 mg/dL (ref 0.44–1.00)
GFR, Estimated: >60 mL/min (ref >60)
Glucose, Bld: 98 mg/dL (ref 70–99)
Potassium: 4 mmol/L (ref 3.5–5.1)
Sodium: 132 mmol/L — ABNORMAL LOW (ref 135–145)
Total Bilirubin: 0.4 mg/dL (ref 0.3–1.2)
Total Protein: 7 g/dL (ref 6.5–8.1)

## 2020-09-12 LAB — CULTURE, BLOOD (ROUTINE X 2)

## 2020-09-12 LAB — CBC
HCT: 30.3 % — ABNORMAL LOW (ref 36.0–46.0)
Hemoglobin: 10 g/dL — ABNORMAL LOW (ref 12.0–15.0)
MCH: 30.7 pg (ref 26.0–34.0)
MCHC: 33 g/dL (ref 30.0–36.0)
MCV: 92.9 fL (ref 80.0–100.0)
Platelets: 43 10*3/uL — ABNORMAL LOW (ref 150–400)
RBC: 3.26 MIL/uL — ABNORMAL LOW (ref 3.87–5.11)
RDW: 17.6 % — ABNORMAL HIGH (ref 11.5–15.5)
WBC: 4.7 10*3/uL (ref 4.0–10.5)
nRBC: 0 % (ref 0.0–0.2)

## 2020-09-12 MED ORDER — PHENAZOPYRIDINE HCL 100 MG PO TABS
100.0000 mg | ORAL_TABLET | Freq: Three times a day (TID) | ORAL | Status: AC
  Administered 2020-09-12 – 2020-09-14 (×5): 100 mg via ORAL
  Filled 2020-09-12 (×6): qty 1

## 2020-09-12 MED ORDER — LOSARTAN POTASSIUM 50 MG PO TABS
50.0000 mg | ORAL_TABLET | Freq: Every day | ORAL | Status: DC
  Administered 2020-09-12: 50 mg via ORAL
  Filled 2020-09-12: qty 1

## 2020-09-12 NOTE — Progress Notes (Signed)
PROGRESS NOTE  Michelle [name] TKW:[id number] DOB: 10-27-1930   PCP: [name], MD  Patient is from: Home.  Patient's daughter moved in to live with her recently.  DOA: 09/07/2020 LOS: 5  Chief complaints:  Chief Complaint  Patient presents with   Weakness   Breast Pain     Brief Narrative / Interim history: 87 year old F with PMH of breast cancer on Ibrance and HTN presenting with dysuria, myalgia, chest wall pain, cough, nausea and dysuria, and admitted for sepsis due to bacteremia and UTI and acute ITP.  Blood and urine culture growing Aerococcus urinae.  Initially started on IV ceftriaxone and transition to IV ampicillin.  She also received blood transfusions, FFP' and IVIG.  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  Subjective: Seen and examined earlier this morning.  No major events overnight of this morning.  Continues to endorse dysuria.  She also tells me that she would prefer to go home when ready.  She denies nausea, vomiting or abdominal pain.  Reports pain in her chest from her breast cancer.   Objective: Vitals:   09/11/20 1504 09/11/20 2153 09/12/20 0641 09/12/20 1359  BP: 136/67 135/61 (!) 153/73 (!) 152/79  Pulse: 82 89 80 86  Resp:  _0 Temp: 97.9 F (36.6 C) 98.8 F (37.1 C) 98.4 F (36.9 C) 98.1 F (36.7 C)  TempSrc: Oral Oral Oral Oral  SpO2: 96% 95% 92% 94%  Weight:      Height:        Intake/Output Summary (Last 24 hours) at 09/12/2020 1531 Last data filed at 09/12/2020 1400 Gross per 24 hour  Intake 2139.45 ml  Output 1350 ml  Net 789.45 ml   Filed Weights   09/07/20 2156 09/08/20 0840  Weight: 47.2 kg 47.1 kg    Examination:  GENERAL: Frail looking elderly female. HEENT: MMM.  Vision and hearing grossly intact.  NECK: Supple.  No apparent JVD.  RESP: 92% on RA.  No IWOB.  Fair aeration bilaterally. CVS:  RRR.  2/6 SEM over RUSB. ABD/GI/GU: BS+. Abd soft, NTND.  MSK/EXT:  Moves extremities. No apparent deformity. No edema.  BLE  weakness SKIN: no apparent skin lesion or wound NEURO: Awake, alert and oriented appropriately.  No apparent focal neuro deficit other than BLE weakness.Marland Kitchen PSYCH: Calm. Normal affect.   Procedures:  None  Microbiology summarized: [id number] and influenza PCR nonreactive. 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 Improving. Recent Labs  Lab 09/07/20 2208 09/07/20 2325 09/08/20 0339 09/09/20 0604 09/09/20 1619 09/10/20 0600 09/11/20 0654 09/12/20 0658  PLT 31* 28* 22* 23* 28* 27* 38* 43*  -Treat sepsis as above.  -Received 4 units of FFP -S/p IVIG 7/28 and 7/30 -Continue p.o. prednisone 66m PO daily -Plan to receive NPLATE on Thurday 84/1/9622either inpatient or outpatient. -Okay to discharge if platelets > 50,000 per oncology.   Hx breast cancer: Followed by Dr. MJana Hakimoutpatient.  On Ibrance and Arimidex -Oncology following as above -Holding Ibrance in the setting of sepsis.  Anemia of chronic disease: H&H stable after 3 units. Recent Labs    01/23/20 1338 04/02/20 1608 05/27/20 1401 09/07/20 2208 09/08/20 0297907/27/22 0604 09/09/20 1619 09/10/20 0600 09/11/20 0654 09/12/20 08921  HGB 10.2* 10.1* 9.6* 9.2* 8.4* 7.3* 7.5* 10.1* 10.1* 10.0*  -Monitor H&H  Essential hypertension: BP slightly elevated. -Resume home losartan at a lower dose. -Continue holding HCTZ  Hyponatremia: SIADH?  Urine chemistry if worse -Continue monitoring   Hypokalemia: Resolved.   Osteoarthritis -Continue tramadol   HLD: On pravastatin outpatient, will hold for now.  Not sure if indicated   Weakness/fatigue/deconditioning: -PT/OT recommending SNF but she prefers  to go home when medically ready. Family in agreement.   Severe protein calorie malnutrition: POA Body mass index is 19.62 kg/m. Nutrition Problem: Increased nutrient needs Etiology: acute illness Signs/Symptoms: estimated needs Interventions: Ensure Enlive (each supplement provides 350kcal and 20 grams of protein), MVI   DVT prophylaxis:  SCDs Start: 09/07/20 2328 due to thrombocytopenia  Code Status: Full code Family Communication: Patient and/or RN. Available if any question.  Level of care: Telemetry Status is: Inpatient  Remains inpatient appropriate because:Unsafe d/c plan, IV treatments appropriate due to intensity of illness or inability to take PO, and Inpatient level of care appropriate due to severity of illness  Dispo: The patient is from: Home              Anticipated d/c is to: Home with [facility]              Patient currently is not medically stable to d/c.   Difficult to place patient No       Consultants:  Hematology/oncology   Sch Meds:  Scheduled Meds:  Chlorhexidine Gluconate Cloth  6 each Topical Daily   feeding supplement  237 mL Oral BID BM   losartan  50 mg Oral Daily   multivitamin with minerals  1 tablet Oral Daily   phenazopyridine  100 mg Oral TID WC   predniSONE  40 mg Oral Q breakfast   Continuous Infusions:  ampicillin (OMNIPEN) IV 2 g (09/12/20 1018)   PRN Meds:.acetaminophen **OR** acetaminophen, lip balm, ondansetron **OR** ondansetron (ZOFRAN) IV, sodium chloride flush, traMADol  Antimicrobials: Anti-infectives (From admission, onward)    Start     Dose/Rate Route Frequency Ordered Stop   09/11/20 1800  ampicillin (OMNIPEN) 2 g in sodium chloride 0.9 % 100 mL IVPB        2 g 300 mL/hr over 20 Minutes Intravenous Every 6 hours 09/11/20 1433     09/11/20 1200  ampicillin (OMNIPEN) 2 g in sodium chloride 0.9 % 100 mL IVPB  Status:  Discontinued        2 g 300 mL/hr over 20 Minutes Intravenous Every 6 hours 09/11/20 1049 09/11/20 1433    09/09/20 2200  cefTRIAXone (ROCEPHIN) 2 g in sodium chloride 0.9 % 100 mL IVPB  Status:  Discontinued        2 g 200 mL/hr over 30 Minutes Intravenous Every 24 hours 09/09/20 0834 09/09/20 1109   09/09/20 1400  cefTRIAXone (ROCEPHIN) 2 g in sodium chloride 0.9 % 100 mL IVPB  Status:  Discontinued        2 g 200 mL/hr over 30 Minutes Intravenous Every 24 hours 09/09/20 1109 09/11/20 1049   09/08/20 2200  cefTRIAXone (ROCEPHIN) 1 g in sodium chloride 0.9 % 100 mL IVPB  Status:  Discontinued        1 g 200 mL/hr over 30 Minutes Intravenous Every 24 hours 09/07/20 2333 09/09/20 0834   09/07/20 2245  cefTRIAXone (ROCEPHIN) 1 g in sodium chloride 0.9 % 100 mL IVPB        1 g 200 mL/hr over  30 Minutes Intravenous  Once 09/07/20 2241 09/08/20 0001        I have personally reviewed the following labs and images: CBC: Recent Labs  Lab 09/07/20 2208 09/07/20 2325 09/09/20 0604 09/09/20 1619 09/10/20 0600 09/11/20 0654 09/12/20 0658  WBC 6.2   < > 5.9 5.1 4.9 5.3 4.7  NEUTROABS 5.3  --   --  4.4  --   --   --   HGB 9.2*   < > 7.3* 7.5* 10.1* 10.1* 10.0*  HCT 27.8*   < > 21.5* 21.9* 29.9* 30.2* 30.3*  MCV 101.1*   < > 97.7 97.8 89.5 91.5 92.9  PLT 31*   < > 23* 28* 27* 38* 43*   < > = values in this interval not displayed.   BMP &GFR Recent Labs  Lab 09/08/20 0339 09/09/20 0604 09/10/20 0600 09/11/20 0654 09/12/20 0658  NA 134* 136 135 134* 132*  K 3.3* 3.8 4.0 4.2 4.0  CL 99 99 101 100 99  CO2 _0 GLUCOSE 107* 102* 216* 102* 98  BUN _1 CREATININE 0.84 0.65 0.70 0.69 0.75  CALCIUM 8.7* 8.6* 8.4* 8.2* 8.1*  MG 1.9 1.8 2.0 1.7  --    Estimated Creatinine Clearance: 36.1 mL/min (by C-G formula based on SCr of 0.75 mg/dL). Liver & Pancreas: Recent Labs  Lab 09/08/20 0339 09/09/20 0604 09/09/20 0832 09/10/20 0600 09/11/20 0654 09/12/20 0658  AST 27 26  --  26 42* 35  ALT 21 18  --  22 33 30  ALKPHOS 61 57  --  72 66 70  BILITOT 1.4* 0.9 1.0  0.8 0.5 0.4  PROT 5.7* 5.0*  --  5.5* 6.4* 7.0  ALBUMIN 2.6* 2.4*  --  2.5* 2.3* 2.1*   No results for input(s): LIPASE, AMYLASE in the last 168 hours. No results for input(s): AMMONIA in the last 168 hours. Diabetic: No results for input(s): HGBA1C in the last 72 hours. No results for input(s): GLUCAP in the last 168 hours. Cardiac Enzymes: No results for input(s): CKTOTAL, CKMB, CKMBINDEX, TROPONINI in the last 168 hours. No results for input(s): PROBNP in the last 8760 hours. Coagulation Profile: Recent Labs  Lab 09/07/20 2208 09/07/20 2325 09/08/20 0339  INR 1.2 1.2 1.1   Thyroid Function Tests: No results for input(s): TSH, T4TOTAL, FREET4, T3FREE, THYROIDAB in the last 72 hours. Lipid Profile: No results for input(s): CHOL, HDL, LDLCALC, TRIG, CHOLHDL, LDLDIRECT in the last 72 hours. Anemia Panel: No results for input(s): VITAMINB12, FOLATE, FERRITIN, TIBC, IRON, RETICCTPCT in the last 72 hours. Urine analysis:    Component Value Date/Time   COLORURINE YELLOW 09/07/2020 2240   APPEARANCEUR CLOUDY (A) 09/07/2020 2240   LABSPEC 1.016 09/07/2020 2240   PHURINE 6.0 09/07/2020 2240   GLUCOSEU NEGATIVE 09/07/2020 2240   HGBUR MODERATE (A) 09/07/2020 2240   BILIRUBINUR NEGATIVE 09/07/2020 2240   KETONESUR 80 (A) 09/07/2020 2240   PROTEINUR 30 (A) 09/07/2020 2240   UROBILINOGEN 0.2 07/05/2012 1610   NITRITE POSITIVE (A) 09/07/2020 2240   LEUKOCYTESUR LARGE (A) 09/07/2020 2240   Sepsis Labs: Invalid input(s): PROCALCITONIN, [place]  Microbiology: Recent Results (from the past 240 hour(s))  Blood Culture (routine x 2)     Status: Abnormal   Collection Time: 09/07/20 10:08 PM   Specimen: BLOOD LEFT FOREARM  Result Value Ref Range Status   Specimen Description   Final    BLOOD LEFT FOREARM Performed at [facility], [place] [address], [place], [place] [address]    Special Requests   Final    BOTTLES DRAWN AEROBIC AND ANAEROBIC Blood Culture results may not be  optimal due to an inadequate volume of blood received in culture bottles Performed at [place] [address] [place], [place] [address]    Culture  Setup Time   Final    GRAM POSITIVE COCCI AEROBIC BOTTLE ONLY CRITICAL VALUE NOTED.  VALUE IS CONSISTENT WITH PREVIOUSLY REPORTED AND CALLED VALUE.    Culture (A)  Final    AEROCOCCUS SPECIES Standardized susceptibility testing for this organism is not available. Performed at [facility], [place] [address] [place], [place] [address]    Report Status 09/12/2020 FINAL  Final  Blood Culture (routine x 2)     Status: Abnormal   Collection Time: 09/07/20 10:08 PM   Specimen: BLOOD LEFT WRIST  Result Value Ref Range Status   Specimen Description   Final    BLOOD LEFT WRIST Performed at [place] [address] [place], [place] [address]    Special Requests   Final    BOTTLES DRAWN AEROBIC AND ANAEROBIC Blood Culture results may not be optimal due to an inadequate volume of blood received in culture bottles Performed at [place] [address], [place], [place] [address]    Culture  Setup Time   Final    GRAM POSITIVE COCCI IN CLUSTERS IN BOTH AEROBIC AND ANAEROBIC BOTTLES CRITICAL RESULT CALLED TO, READ BACK BY AND VERIFIED WITH: PHARMD M.TECKER AT 3329 ON 09/09/2020 BY T.SAAD Performed at [facility], [place] [address] [place],  [address]    Culture AEROCOCCUS URINAE (A)  Final   Report Status 09/11/2020 FINAL  Final  Blood Culture ID Panel (Reflexed)     Status: None   Collection Time: 09/07/20 10:08 PM  Result Value Ref Range Status   Enterococcus faecalis NOT DETECTED NOT DETECTED Final   Enterococcus Faecium NOT DETECTED NOT DETECTED Final   Listeria monocytogenes NOT DETECTED NOT DETECTED Final   Staphylococcus species NOT DETECTED NOT DETECTED Final   Staphylococcus aureus (BCID) NOT DETECTED NOT DETECTED Final   Staphylococcus epidermidis NOT DETECTED  NOT DETECTED Final   Staphylococcus lugdunensis NOT DETECTED NOT DETECTED Final   Streptococcus species NOT DETECTED NOT DETECTED Final   Streptococcus agalactiae NOT DETECTED NOT DETECTED Final   Streptococcus pneumoniae NOT DETECTED NOT DETECTED Final   Streptococcus pyogenes NOT DETECTED NOT DETECTED Final   A.calcoaceticus-baumannii NOT DETECTED NOT DETECTED Final   Bacteroides fragilis NOT DETECTED NOT DETECTED Final   Enterobacterales NOT DETECTED NOT DETECTED Final   Enterobacter cloacae complex NOT DETECTED NOT DETECTED Final   Escherichia coli NOT DETECTED NOT DETECTED Final   Klebsiella aerogenes NOT DETECTED NOT DETECTED Final   Klebsiella oxytoca NOT DETECTED NOT DETECTED Final   Klebsiella pneumoniae NOT DETECTED NOT DETECTED Final   Proteus species NOT DETECTED NOT DETECTED Final   Salmonella species NOT DETECTED NOT DETECTED Final   Serratia marcescens NOT DETECTED NOT DETECTED Final   Haemophilus influenzae NOT DETECTED NOT DETECTED Final   Neisseria meningitidis NOT DETECTED NOT DETECTED Final   Pseudomonas aeruginosa NOT DETECTED NOT DETECTED Final   Stenotrophomonas maltophilia NOT DETECTED NOT DETECTED Final   Candida albicans NOT DETECTED NOT DETECTED Final   Candida auris NOT DETECTED NOT DETECTED Final   Candida glabrata NOT DETECTED NOT DETECTED Final   Candida krusei  NOT DETECTED NOT DETECTED Final   Candida parapsilosis NOT DETECTED NOT DETECTED Final   Candida tropicalis NOT DETECTED NOT DETECTED Final   Cryptococcus neoformans/gattii NOT DETECTED NOT DETECTED Final    Comment: Performed at [facility], [place] [address] [place], [place] [address]  Resp Panel by RT-PCR (Flu A&B, Covid) Nasopharyngeal Swab     Status: None   Collection Time: 09/07/20 10:10 PM   Specimen: Nasopharyngeal Swab; Nasopharyngeal(NP) swabs in vial transport medium  Result Value Ref Range Status   SARS Coronavirus 2 by RT PCR NEGATIVE NEGATIVE Final    Comment:  (NOTE) SARS-CoV-2 target nucleic acids are NOT DETECTED.  The SARS-CoV-2 RNA is generally detectable in upper respiratory specimens during the acute phase of infection. The lowest concentration of SARS-CoV-2 viral copies this assay can detect is 138 copies/mL. A negative result does not preclude SARS-Cov-2 infection and should not be used as the sole basis for treatment or other patient management decisions. A negative result may occur with  improper specimen collection/handling, submission of specimen other than nasopharyngeal swab, presence of viral mutation(s) within the areas targeted by this assay, and inadequate number of viral copies(<138 copies/mL). A negative result must be combined with clinical observations, patient history, and epidemiological information. The expected result is Negative.  Fact Sheet for Patients:  EntrepreneurPulse.com.au  Fact Sheet for Healthcare Providers:  IncredibleEmployment.be  This test is no t yet approved or cleared by the [place] FDA and  has been authorized for detection and/or diagnosis of SARS-CoV-2 by FDA under an Emergency Use Authorization (EUA). This EUA will remain  in effect (meaning this test can be used) for the duration of the COVID-19 declaration under Section 564(b)(1) of the Act, 21 U.S.C.section 360bbb-3(b)(1), unless the authorization is terminated  or revoked sooner.       Influenza A by PCR NEGATIVE NEGATIVE Final   Influenza B by PCR NEGATIVE NEGATIVE Final    Comment: (NOTE) The Xpert Xpress SARS-CoV-2/FLU/RSV plus assay is intended as an aid in the diagnosis of influenza from Nasopharyngeal swab specimens and should not be used as a sole basis for treatment. Nasal washings and aspirates are unacceptable for Xpert Xpress SARS-CoV-2/FLU/RSV testing.  Fact Sheet for Patients: EntrepreneurPulse.com.au  Fact Sheet for Healthcare  Providers: IncredibleEmployment.be  This test is not yet approved or cleared by the [place] FDA and has been authorized for detection and/or diagnosis of SARS-CoV-2 by FDA under an Emergency Use Authorization (EUA). This EUA will remain in effect (meaning this test can be used) for the duration of the COVID-19 declaration under Section 564(b)(1) of the Act, 21 U.S.C. section 360bbb-3(b)(1), unless the authorization is terminated or revoked.  Performed at [facility], [place] [address] [place], [place] [address]   Urine Culture     Status: Abnormal   Collection Time: 09/07/20 10:40 PM   Specimen: In/Out Cath Urine  Result Value Ref Range Status   Specimen Description   Final    IN/OUT CATH URINE Performed at [place] [address], [place], [place] [address]    Special Requests   Final    NONE Performed at [facility], [place] [address] [place], [place] [address]    Culture MULTIPLE SPECIES PRESENT, SUGGEST RECOLLECTION (A)  Final   Report Status 09/09/2020 FINAL  Final    Radiology Studies: No results found.    Elvyn Krohn T. Coppock  If 7PM-7AM, please contact night-coverage [email or web address] 09/12/2020, 3:31 PM

## 2020-09-13 DIAGNOSIS — T380X5A Adverse effect of glucocorticoids and synthetic analogues, initial encounter: Secondary | ICD-10-CM

## 2020-09-13 DIAGNOSIS — Z7189 Other specified counseling: Secondary | ICD-10-CM

## 2020-09-13 DIAGNOSIS — N390 Urinary tract infection, site not specified: Secondary | ICD-10-CM | POA: Diagnosis not present

## 2020-09-13 DIAGNOSIS — R7881 Bacteremia: Secondary | ICD-10-CM

## 2020-09-13 DIAGNOSIS — D84821 Immunodeficiency due to drugs: Secondary | ICD-10-CM

## 2020-09-13 DIAGNOSIS — Z7952 Long term (current) use of systemic steroids: Secondary | ICD-10-CM

## 2020-09-13 DIAGNOSIS — A419 Sepsis, unspecified organism: Secondary | ICD-10-CM | POA: Diagnosis not present

## 2020-09-13 DIAGNOSIS — D693 Immune thrombocytopenic purpura: Secondary | ICD-10-CM | POA: Diagnosis not present

## 2020-09-13 DIAGNOSIS — C50412 Malignant neoplasm of upper-outer quadrant of left female breast: Secondary | ICD-10-CM | POA: Diagnosis not present

## 2020-09-13 DIAGNOSIS — R011 Cardiac murmur, unspecified: Secondary | ICD-10-CM

## 2020-09-13 LAB — RENAL FUNCTION PANEL
Albumin: 2.1 g/dL — ABNORMAL LOW (ref 3.5–5.0)
Anion gap: 6 (ref 5–15)
BUN: 15 mg/dL (ref 8–23)
CO2: 29 mmol/L (ref 22–32)
Calcium: 8.3 mg/dL — ABNORMAL LOW (ref 8.9–10.3)
Chloride: 95 mmol/L — ABNORMAL LOW (ref 98–111)
Creatinine, Ser: 0.73 mg/dL (ref 0.44–1.00)
GFR, Estimated: >60 mL/min (ref >60)
Glucose, Bld: 91 mg/dL (ref 70–99)
Phosphorus: 2.4 mg/dL — ABNORMAL LOW (ref 2.5–4.6)
Potassium: 4.1 mmol/L (ref 3.5–5.1)
Sodium: 130 mmol/L — ABNORMAL LOW (ref 135–145)

## 2020-09-13 LAB — CBC
HCT: 29.4 % — ABNORMAL LOW (ref 36.0–46.0)
Hemoglobin: 9.9 g/dL — ABNORMAL LOW (ref 12.0–15.0)
MCH: 31.1 pg (ref 26.0–34.0)
MCHC: 33.7 g/dL (ref 30.0–36.0)
MCV: 92.5 fL (ref 80.0–100.0)
Platelets: 52 10*3/uL — ABNORMAL LOW (ref 150–400)
RBC: 3.18 MIL/uL — ABNORMAL LOW (ref 3.87–5.11)
RDW: 17.4 % — ABNORMAL HIGH (ref 11.5–15.5)
WBC: 5.4 10*3/uL (ref 4.0–10.5)
nRBC: 0 % (ref 0.0–0.2)

## 2020-09-13 LAB — MAGNESIUM: Magnesium: 1.8 mg/dL (ref 1.7–2.4)

## 2020-09-13 MED ORDER — VANCOMYCIN HCL 750 MG/150ML IV SOLN
750.0000 mg | Freq: Once | INTRAVENOUS | Status: AC
  Administered 2020-09-13: 750 mg via INTRAVENOUS
  Filled 2020-09-13: qty 150

## 2020-09-13 MED ORDER — SODIUM CHLORIDE 0.9 % IV SOLN
2.0000 g | Freq: Four times a day (QID) | INTRAVENOUS | Status: DC
  Administered 2020-09-13 – 2020-09-15 (×8): 2 g via INTRAVENOUS
  Filled 2020-09-13: qty 2000
  Filled 2020-09-13: qty 2
  Filled 2020-09-13 (×2): qty 2000
  Filled 2020-09-13: qty 2
  Filled 2020-09-13: qty 2000
  Filled 2020-09-13: qty 2
  Filled 2020-09-13 (×3): qty 2000

## 2020-09-13 MED ORDER — SODIUM CHLORIDE 0.9 % IV SOLN: INTRAVENOUS | Status: DC

## 2020-09-13 MED ORDER — MIRTAZAPINE 15 MG PO TABS
15.0000 mg | ORAL_TABLET | Freq: Every day | ORAL | Status: DC
  Administered 2020-09-13 – 2020-09-16 (×4): 15 mg via ORAL
  Filled 2020-09-13 (×4): qty 1

## 2020-09-13 MED ORDER — VANCOMYCIN HCL 500 MG/100ML IV SOLN
500.0000 mg | INTRAVENOUS | Status: DC
  Administered 2020-09-14: 500 mg via INTRAVENOUS
  Filled 2020-09-13: qty 100

## 2020-09-13 MED ORDER — SODIUM CHLORIDE 0.9 % IV SOLN: 2.0000 g | Freq: Four times a day (QID) | INTRAVENOUS | Status: DC

## 2020-09-13 NOTE — Progress Notes (Signed)
PROGRESS NOTE  Michelle [name] QMG:[id number] DOB: [birth year]   PCP: [name], MD  Patient is from: Home.  Patient's daughter moved in to live with her recently.  DOA: 09/07/2020 LOS: 6  Chief complaints:  Chief Complaint  Patient presents with   Weakness   Breast Pain     Brief Narrative / Interim history: 87 year old F with PMH of breast cancer on Ibrance and HTN presenting with dysuria, myalgia, chest wall pain, cough, nausea and dysuria, and admitted for sepsis due to bacteremia and UTI and acute ITP.  Blood and urine culture growing Aerococcus urinae.  Initially started on IV ceftriaxone and transitioned to IV ampicillin.  She also received blood transfusions, FFP' and IVIG.  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 ID consulted on 7/31 and recommended adding IV vancomycin, repeat blood culture and TTE to exclude endocarditis.  Subjective: Seen and examined earlier this morning.  Patient was confused and pulled out IV lines last night.  She is somewhat sleepy but wakes to voice easily.  She denies pain, shortness of breath, nausea or vomiting but does not feel like waking up and eating.  Objective: Vitals:   09/12/20 0641 09/12/20 1359 09/12/20 2100 09/13/20 0600  BP: (!) 153/73 (!) 152/79 (!) 131/54 (!) 127/56  Pulse: 80 86 73 73  Resp: '20 19 20   ' Temp: 98.4 F (36.9 C) 98.1 F (36.7 C) 98.5 F (36.9 C) 98.9 F (37.2 C)  TempSrc: Oral Oral Oral Oral  SpO2: 92% 94% 95% 95%  Weight:      Height:        Intake/Output Summary (Last 24 hours) at 09/13/2020 1246 Last data filed at 09/13/2020 1100 Gross per 24 hour  Intake 240 ml  Output 1375 ml  Net -1135 ml   Filed Weights   09/07/20 2156 09/08/20 0840  Weight: 47.2 kg 47.1 kg    Examination:  GENERAL: Frail and chronically ill-appearing but nontoxic. HEENT: MMM.  Vision and hearing grossly intact.  NECK: Supple.  No apparent JVD.  RESP: 95% on RA.  No IWOB.  Fair aeration bilaterally. CVS:  RRR.  2/6 SEM over  RUSB.  Heart sounds normal.  ABD/GI/GU: BS+. Abd soft, NTND.  MSK/EXT:  Moves extremities. No apparent deformity. No edema.  SKIN: no apparent skin lesion or wound NEURO: Sleepy but wakes to voice.  Fairly oriented.  No apparent focal neuro deficit. PSYCH: Calm. Normal affect.   Procedures:  None  Microbiology summarized: [id number] and influenza PCR nonreactive. Blood and urine culture with Aerococcus urinae  Assessment & Plan: Severe sepsis due to Aerococcus urinae bacteremia and UTI in immunocompromised patient: POA -Met criteria on presentation with SIRS, hyperbilirubinemia and thrombocytopenia -IV ceftriaxone>>> IV ampicillin -7/31-ID consulted and recommended adding IV vancomycin, repeat blood culture and TTE -Continue Azo for dysuria today.    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 Improving. Recent Labs  Lab 09/07/20 2208 09/07/20 2325 09/08/20 0339 09/09/20 0604 09/09/20 1619 09/10/20 0600 09/11/20 0654 09/12/20 0658 09/13/20 0734  PLT 31* 28* 22* 23* 28* 27* 38* 43* 52*  -Treat sepsis as above.  -Received 4 units of FFP -S/p IVIG 7/28 and 7/30 -Continue p.o. prednisone 35m PO daily -Plan to receive NPLATE on Thurday 80/3/8882either inpatient or outpatient. -Okay to discharge if platelets > 50,000 per heme-onc.   Hx breast cancer: Followed by Dr. MJana Hakimoutpatient.  On Ibrance and Arimidex -Oncology following as above -Holding Ibrance in the setting of sepsis.  Anemia of  chronic disease: H&H stable after 3 units. Recent Labs    04/02/20 1608 05/27/20 1401 09/07/20 2208 09/08/20 0339 09/09/20 0604 09/09/20 1619 09/10/20 0600 09/11/20 0654 09/12/20 0658 09/13/20 0734  HGB 10.1* 9.6* 9.2* 8.4* 7.3* 7.5* 10.1* 10.1* 10.0* 9.9*  -Monitor H&H  Essential hypertension: Normotensive. -Discontinue losartan given hyponatremia -C continue holding HCTZ.  Hyponatremia: Hypovolemic?  SIADH?  HCTZ and losartan could contribute as  well -Trial of IV NS at 60 cc an hour   Hypokalemia: Resolved.   Osteoarthritis -Scheduled Tylenol -Discontinue tramadol  Acute metabolic encephalopathy: Delirium?  She was awake and confused overnight.  Sleepy this morning. -Discontinue tramadol -Reorientation and delirium precautions.   HLD: On pravastatin outpatient, will hold for now.  Not sure if indicated   Weakness/fatigue/deconditioning: -PT/OT recommending SNF but she prefers to go home when medically ready. Family in agreement.  Goal of care counseling: patient with comorbidities as above.  Poor long-term prognosis.  She is very frail.  Still full code.  Briefly discussed about CODE STATUS with patient's daughter at bedside.  Patient's daughter would like to have her brothers involved.  Unfortunately, I was not able to reach her brother (patient's son) over the phone. -Palliative care consulted for assistance with goals of care discussion   Severe protein calorie malnutrition/decreased p.o. intake: POA -Gentle IV fluid -P.o. Remeron 15 mg nightly Body mass index is 19.62 kg/m. Nutrition Problem: Increased nutrient needs Etiology: acute illness Signs/Symptoms: estimated needs Interventions: Ensure Enlive (each supplement provides 350kcal and 20 grams of protein), MVI   DVT prophylaxis:  SCDs Start: 09/07/20 2328 due to thrombocytopenia  Code Status: Full code Family Communication: Updated patient's daughter at bedside.  Level of care: Telemetry Status is: Inpatient  Remains inpatient appropriate because:Persistent severe electrolyte disturbances, Altered mental status, Ongoing diagnostic testing needed not appropriate for outpatient work up, Unsafe d/c plan, IV treatments appropriate due to intensity of illness or inability to take PO, and Inpatient level of care appropriate due to severity of illness  Dispo: The patient is from: Home              Anticipated d/c is to: Home with [facility]              Patient currently  is not medically stable to d/c.   Difficult to place patient No       Consultants:  Hematology/oncology   Sch Meds:  Scheduled Meds:  feeding supplement  237 mL Oral BID BM   mirtazapine  15 mg Oral QHS   multivitamin with minerals  1 tablet Oral Daily   phenazopyridine  100 mg Oral TID WC   predniSONE  40 mg Oral Q breakfast   Continuous Infusions:  sodium chloride     ampicillin (OMNIPEN) IV     [START ON 09/14/2020] vancomycin     vancomycin     PRN Meds:.acetaminophen **OR** acetaminophen, lip balm, ondansetron **OR** ondansetron (ZOFRAN) IV, sodium chloride flush  Antimicrobials: Anti-infectives (From admission, onward)    Start     Dose/Rate Route Frequency Ordered Stop   09/14/20 0600  vancomycin (VANCOREADY) IVPB 500 mg/100 mL        500 mg 100 mL/hr over 60 Minutes Intravenous Every 24 hours 09/13/20 1135     09/13/20 1345  ampicillin (OMNIPEN) 2 g in sodium chloride 0.9 % 100 mL IVPB        2 g 300 mL/hr over 20 Minutes Intravenous Every 6 hours 09/13/20 1246     09/13/20  1330  ampicillin (OMNIPEN) 2 g in sodium chloride 0.9 % 100 mL IVPB  Status:  Discontinued        2 g 300 mL/hr over 20 Minutes Intravenous Every 6 hours 09/13/20 1232 09/13/20 1236   09/13/20 1230  vancomycin (VANCOREADY) IVPB 750 mg/150 mL        750 mg 150 mL/hr over 60 Minutes Intravenous  Once 09/13/20 1135     09/11/20 1800  ampicillin (OMNIPEN) 2 g in sodium chloride 0.9 % 100 mL IVPB  Status:  Discontinued        2 g 300 mL/hr over 20 Minutes Intravenous Every 6 hours 09/11/20 1433 09/13/20 1113   09/11/20 1200  ampicillin (OMNIPEN) 2 g in sodium chloride 0.9 % 100 mL IVPB  Status:  Discontinued        2 g 300 mL/hr over 20 Minutes Intravenous Every 6 hours 09/11/20 1049 09/11/20 1433   09/09/20 2200  cefTRIAXone (ROCEPHIN) 2 g in sodium chloride 0.9 % 100 mL IVPB  Status:  Discontinued        2 g 200 mL/hr over 30 Minutes Intravenous Every 24 hours 09/09/20 0834 09/09/20 1109    09/09/20 1400  cefTRIAXone (ROCEPHIN) 2 g in sodium chloride 0.9 % 100 mL IVPB  Status:  Discontinued        2 g 200 mL/hr over 30 Minutes Intravenous Every 24 hours 09/09/20 1109 09/11/20 1049   09/08/20 2200  cefTRIAXone (ROCEPHIN) 1 g in sodium chloride 0.9 % 100 mL IVPB  Status:  Discontinued        1 g 200 mL/hr over 30 Minutes Intravenous Every 24 hours 09/07/20 2333 09/09/20 0834   09/07/20 2245  cefTRIAXone (ROCEPHIN) 1 g in sodium chloride 0.9 % 100 mL IVPB        1 g 200 mL/hr over 30 Minutes Intravenous  Once 09/07/20 2241 09/08/20 0001        I have personally reviewed the following labs and images: CBC: Recent Labs  Lab 09/07/20 2208 09/07/20 2325 09/09/20 1619 09/10/20 0600 09/11/20 0654 09/12/20 0658 09/13/20 0734  WBC 6.2   < > 5.1 4.9 5.3 4.7 5.4  NEUTROABS 5.3  --  4.4  --   --   --   --   HGB 9.2*   < > 7.5* 10.1* 10.1* 10.0* 9.9*  HCT 27.8*   < > 21.9* 29.9* 30.2* 30.3* 29.4*  MCV 101.1*   < > 97.8 89.5 91.5 92.9 92.5  PLT 31*   < > 28* 27* 38* 43* 52*   < > = values in this interval not displayed.   BMP &GFR Recent Labs  Lab 09/08/20 0339 09/09/20 0604 09/10/20 0600 09/11/20 0654 09/12/20 0658 09/13/20 0734  NA 134* 136 135 134* 132* 130*  K 3.3* 3.8 4.0 4.2 4.0 4.1  CL 99 99 101 100 99 95*  CO2 '25 27 28 27 29 29  ' GLUCOSE 107* 102* 216* 102* 98 91  BUN '20 16 17 18 17 15  ' CREATININE 0.84 0.65 0.70 0.69 0.75 0.73  CALCIUM 8.7* 8.6* 8.4* 8.2* 8.1* 8.3*  MG 1.9 1.8 2.0 1.7  --  1.8  PHOS  --   --   --   --   --  2.4*   Estimated Creatinine Clearance: 36.1 mL/min (by C-G formula based on SCr of 0.73 mg/dL). Liver & Pancreas: Recent Labs  Lab 09/08/20 6222 09/09/20 0604 09/09/20 9798 09/10/20 0600 09/11/20 0654 09/12/20 9211 09/13/20 9417  AST 27 26  --  26 42* 35  --   ALT 21 18  --  22 33 30  --   ALKPHOS 61 57  --  72 66 70  --   BILITOT 1.4* 0.9 1.0 0.8 0.5 0.4  --   PROT 5.7* 5.0*  --  5.5* 6.4* 7.0  --   ALBUMIN 2.6* 2.4*  --   2.5* 2.3* 2.1* 2.1*   No results for input(s): LIPASE, AMYLASE in the last 168 hours. No results for input(s): AMMONIA in the last 168 hours. Diabetic: No results for input(s): HGBA1C in the last 72 hours. No results for input(s): GLUCAP in the last 168 hours. Cardiac Enzymes: No results for input(s): CKTOTAL, CKMB, CKMBINDEX, TROPONINI in the last 168 hours. No results for input(s): PROBNP in the last 8760 hours. Coagulation Profile: Recent Labs  Lab 09/07/20 2208 09/07/20 2325 09/08/20 0339  INR 1.2 1.2 1.1   Thyroid Function Tests: No results for input(s): TSH, T4TOTAL, FREET4, T3FREE, THYROIDAB in the last 72 hours. Lipid Profile: No results for input(s): CHOL, HDL, LDLCALC, TRIG, CHOLHDL, LDLDIRECT in the last 72 hours. Anemia Panel: No results for input(s): VITAMINB12, FOLATE, FERRITIN, TIBC, IRON, RETICCTPCT in the last 72 hours. Urine analysis:    Component Value Date/Time   COLORURINE YELLOW 09/07/2020 2240   APPEARANCEUR CLOUDY (A) 09/07/2020 2240   LABSPEC 1.016 09/07/2020 2240   PHURINE 6.0 09/07/2020 2240   GLUCOSEU NEGATIVE 09/07/2020 2240   HGBUR MODERATE (A) 09/07/2020 2240   BILIRUBINUR NEGATIVE 09/07/2020 2240   KETONESUR 80 (A) 09/07/2020 2240   PROTEINUR 30 (A) 09/07/2020 2240   UROBILINOGEN 0.2 07/05/2012 1610   NITRITE POSITIVE (A) 09/07/2020 2240   LEUKOCYTESUR LARGE (A) 09/07/2020 2240   Sepsis Labs: Invalid input(s): PROCALCITONIN, [place]  Microbiology: Recent Results (from the past 240 hour(s))  Blood Culture (routine x 2)     Status: Abnormal   Collection Time: 09/07/20 10:08 PM   Specimen: BLOOD LEFT FOREARM  Result Value Ref Range Status   Specimen Description   Final    BLOOD LEFT FOREARM Performed at [facility], [address] [place], [name] [address]    Special Requests   Final    BOTTLES DRAWN AEROBIC AND ANAEROBIC Blood Culture results may not be optimal due to an inadequate volume of blood received in culture  bottles Performed at [facility] [address] [place], [place] [address]    Culture  Setup Time   Final    GRAM POSITIVE COCCI AEROBIC BOTTLE ONLY CRITICAL VALUE NOTED.  VALUE IS CONSISTENT WITH PREVIOUSLY REPORTED AND CALLED VALUE.    Culture (A)  Final    AEROCOCCUS SPECIES Standardized susceptibility testing for this organism is not available. Performed at [facility], [place] [address], [place], [place] [address]    Report Status 09/12/2020 FINAL  Final  Blood Culture (routine x 2)     Status: Abnormal (Preliminary result)   Collection Time: 09/07/20 10:08 PM   Specimen: BLOOD LEFT WRIST  Result Value Ref Range Status   Specimen Description   Final    BLOOD LEFT WRIST Performed at [facility] [address] [place], [place] [address]    Special Requests   Final    BOTTLES DRAWN AEROBIC AND ANAEROBIC Blood Culture results may not be optimal due to an inadequate volume of blood received in culture bottles Performed at [facility] [address] [place],  [place] [address]    Culture  Setup Time   Final    GRAM POSITIVE COCCI IN CLUSTERS IN BOTH AEROBIC AND ANAEROBIC BOTTLES CRITICAL RESULT CALLED TO, READ BACK BY AND VERIFIED WITH: PHARMD M.TECKER AT 0816 ON 09/09/2020 BY T.SAAD    Culture (A)  Final    AEROCOCCUS URINAE CULTURE REINCUBATED FOR BETTER GROWTH Performed at [facility], [place] [address], [place], [place] [address]    Report Status PENDING  Incomplete  Blood Culture ID Panel (Reflexed)     Status: None   Collection Time: 09/07/20 10:08 PM  Result Value Ref Range Status   Enterococcus faecalis NOT DETECTED NOT DETECTED Final   Enterococcus Faecium NOT DETECTED NOT DETECTED Final   Listeria monocytogenes NOT DETECTED NOT DETECTED Final   Staphylococcus species NOT DETECTED NOT DETECTED Final   Staphylococcus aureus (BCID) NOT DETECTED NOT DETECTED Final   Staphylococcus epidermidis NOT DETECTED  NOT DETECTED Final   Staphylococcus lugdunensis NOT DETECTED NOT DETECTED Final   Streptococcus species NOT DETECTED NOT DETECTED Final   Streptococcus agalactiae NOT DETECTED NOT DETECTED Final   Streptococcus pneumoniae NOT DETECTED NOT DETECTED Final   Streptococcus pyogenes NOT DETECTED NOT DETECTED Final   A.calcoaceticus-baumannii NOT DETECTED NOT DETECTED Final   Bacteroides fragilis NOT DETECTED NOT DETECTED Final   Enterobacterales NOT DETECTED NOT DETECTED Final   Enterobacter cloacae complex NOT DETECTED NOT DETECTED Final   Escherichia coli NOT DETECTED NOT DETECTED Final   Klebsiella aerogenes NOT DETECTED NOT DETECTED Final   Klebsiella oxytoca NOT DETECTED NOT DETECTED Final   Klebsiella pneumoniae NOT DETECTED NOT DETECTED Final   Proteus species NOT DETECTED NOT DETECTED Final   Salmonella species NOT DETECTED NOT DETECTED Final   Serratia marcescens NOT DETECTED NOT DETECTED Final   Haemophilus influenzae NOT DETECTED NOT DETECTED Final   Neisseria meningitidis NOT DETECTED NOT DETECTED Final   Pseudomonas aeruginosa NOT DETECTED NOT DETECTED Final   Stenotrophomonas maltophilia NOT DETECTED NOT DETECTED Final   Candida albicans NOT DETECTED NOT DETECTED Final   Candida auris NOT DETECTED NOT DETECTED Final   Candida glabrata NOT DETECTED NOT DETECTED Final   Candida krusei NOT DETECTED NOT DETECTED Final   Candida parapsilosis NOT DETECTED NOT DETECTED Final   Candida tropicalis NOT DETECTED NOT DETECTED Final   Cryptococcus neoformans/gattii NOT DETECTED NOT DETECTED Final    Comment: Performed at [facility], [address], [place],  [address]  Resp Panel by RT-PCR (Flu A&B, Covid) Nasopharyngeal Swab     Status: None   Collection Time: 09/07/20 10:10 PM   Specimen: Nasopharyngeal Swab; Nasopharyngeal(NP) swabs in vial transport medium  Result Value Ref Range Status   SARS Coronavirus 2 by RT PCR NEGATIVE NEGATIVE Final    Comment:  (NOTE) SARS-CoV-2 target nucleic acids are NOT DETECTED.  The SARS-CoV-2 RNA is generally detectable in upper respiratory specimens during the acute phase of infection. The lowest concentration of SARS-CoV-2 viral copies this assay can detect is 138 copies/mL. A negative result does not preclude SARS-Cov-2 infection and should not be used as the sole basis for treatment or other patient management decisions. A negative result may occur with  improper specimen collection/handling, submission of specimen other than nasopharyngeal swab, presence of viral mutation(s) within the areas targeted by this assay, and inadequate number of viral copies(<138 copies/mL). A negative result must be combined with clinical observations, patient history, and epidemiological information. The expected result is Negative.  Fact Sheet for Patients:  EntrepreneurPulse.com.au  Fact Sheet for Healthcare Providers:  IncredibleEmployment.be  This test is no t yet approved or cleared by the [place] FDA and  has been authorized for detection and/or diagnosis of SARS-CoV-2 by FDA under an Emergency Use Authorization (EUA). This EUA will remain  in effect (meaning this test can be used) for the duration of the COVID-19 declaration under Section 564(b)(1) of the Act, 21 U.S.C.section 360bbb-3(b)(1), unless the authorization is terminated  or revoked sooner.       Influenza A by PCR NEGATIVE NEGATIVE Final   Influenza B by PCR NEGATIVE NEGATIVE Final    Comment: (NOTE) The Xpert Xpress SARS-CoV-2/FLU/RSV plus assay is intended as an aid in the diagnosis of influenza from Nasopharyngeal swab specimens and should not be used as a sole basis for treatment. Nasal washings and aspirates are unacceptable for Xpert Xpress SARS-CoV-2/FLU/RSV testing.  Fact Sheet for Patients: EntrepreneurPulse.com.au  Fact Sheet for Healthcare  Providers: IncredibleEmployment.be  This test is not yet approved or cleared by the [place] FDA and has been authorized for detection and/or diagnosis of SARS-CoV-2 by FDA under an Emergency Use Authorization (EUA). This EUA will remain in effect (meaning this test can be used) for the duration of the COVID-19 declaration under Section 564(b)(1) of the Act, 21 U.S.C. section 360bbb-3(b)(1), unless the authorization is terminated or revoked.  Performed at [facility], [place] [address], [place], [place] [address]   Urine Culture     Status: Abnormal   Collection Time: 09/07/20 10:40 PM   Specimen: In/Out Cath Urine  Result Value Ref Range Status   Specimen Description   Final    IN/OUT CATH URINE Performed at [facility] [address] [place], [place] [address]    Special Requests   Final    NONE Performed at [facility], [place] [address], [place], [place] [address]    Culture MULTIPLE SPECIES PRESENT, SUGGEST RECOLLECTION (A)  Final   Report Status 09/09/2020 FINAL  Final    Radiology Studies: No results found.    Joniece Smotherman T. [place]  If 7PM-7AM, please contact night-coverage [email or web address] 09/13/2020, 12:46 PM

## 2020-09-13 NOTE — Consult Note (Signed)
[name] for [facility]ious [facility]    Date of Admission:  09/07/2020     Reason for Consult: bacteremia    Referring Provider: Cyndia Skeeters     Lines:  Peripheral iv's  Abx: 7/31-c vanc 7/29-c ampicillin   7/27-29 ceftriaxone         Assessment: Aerococcus urinae bsi Breast cancer on ibrance chemotherapy Thrombocytopenia; itp on chronic pred   87 yo female with itp, breast cancer on ibrance, admitted with uti sx found to also have aerococcus urinae bacteremia  Aerococcus urinae can cause endocarditis, and if so is extremely difficult to treat. Limited literature evidence but combination of beta-lactam/aminoglycoside or betalactam/vanc  Will need to r/o endocarditis  Source unclear as ucx not growing it but suspect due to uti  Clinically improving. No other focus of metastatic involvement obvious at this time  Prolonged itp tx with steroid. Might need pjp prophy   Plan: Repeat blood cx tte Continue ampicillin Add vancomycin for now pending further w/u If chronic pred >15 mg/kg a day is used >2 weeks duration, consider pjp prophylaxis Discussed with primary team   I spent 60 minute reviewing data/chart, and coordinating care and >50% direct face to face time providing counseling/discussing diagnostics/treatment plan with patient       ------------------------------------------------ Principal Problem:   Sepsis secondary to UTI [facility] Active Problems:   Hypertension   Malignant neoplasm of upper-outer quadrant of left breast in female, estrogen receptor positive ([place])   Thrombocytopenia ([place])   Anemia   Acute ITP ([place])    HPI: Michelle [name] is a 87 y.o. female breast cancer on ibrance, itp on prednisone, admitted with sx of uti, found to have bacteremia as well  She has had a few days of sx prior to admission Febrile on admission 101s Placed on uti tx initially Bcx grew aerococcus urinae and Id was called  She is followed by onc for  itp/breast cancer. On prednisone and ibrance  She denies n/v/diarrhea currently, or rash or focal joint pain  She is still fatigue but feeling better than admission  She has no cardiac device, prosthetic, or catheters   Family History  Adopted: Yes    Social History   Tobacco Use   Smoking status: Never   Smokeless tobacco: Never  Vaping Use   Vaping Use: Never used  Substance Use Topics   Alcohol use: No   Drug use: No    Frequency: 3.0 times per week    No Known Allergies  Review of Systems: ROS All Other ROS was negative, except mentioned above   Past Medical History:  Diagnosis Date   Arthritis    Cancer ([place])    Bilaterial Breast Cancer   Dyspnea    GERD (gastroesophageal reflux disease)    Hypertension    Stroke ([place])    "mini stroke" per patient       Scheduled Meds:  feeding supplement  237 mL Oral BID BM   multivitamin with minerals  1 tablet Oral Daily   phenazopyridine  100 mg Oral TID WC   predniSONE  40 mg Oral Q breakfast   Continuous Infusions:  [START ON 09/14/2020] vancomycin     vancomycin     PRN Meds:.acetaminophen **OR** acetaminophen, lip balm, ondansetron **OR** ondansetron (ZOFRAN) IV, sodium chloride flush   OBJECTIVE: Blood pressure (!) 127/56, pulse 73, temperature 98.9 F (37.2 C), temperature source Oral, resp. rate 20, height '5\' 1"'$  (1.549 m), weight 47.1  kg, SpO2 95 %.  Physical Exam General/constitutional: no distress, pleasant HEENT: Normocephalic, PER, Conj Clear, EOMI, Oropharynx poor dentition Neck supple CV: rrr no mrg Lungs: clear to auscultation, normal respiratory effort Abd: Soft, Nontender Ext: no edema Skin: No Rash Neuro: nonfocal MSK: no peripheral joint swelling/tenderness/warmth; back spines nontender Psych alert/oriented  Central line presence: no    Lab Results Lab Results  Component Value Date   WBC 5.4 09/13/2020   HGB 9.9 (L) 09/13/2020   HCT 29.4 (L) 09/13/2020   MCV 92.5  09/13/2020   PLT 52 (L) 09/13/2020    Lab Results  Component Value Date   CREATININE 0.73 09/13/2020   BUN 15 09/13/2020   NA 130 (L) 09/13/2020   K 4.1 09/13/2020   CL 95 (L) 09/13/2020   CO2 29 09/13/2020    Lab Results  Component Value Date   ALT 30 09/12/2020   AST 35 09/12/2020   ALKPHOS 70 09/12/2020   BILITOT 0.4 09/12/2020      Microbiology: Recent Results (from the past 240 hour(s))  Blood Culture (routine x 2)     Status: Abnormal   Collection Time: 09/07/20 10:08 PM   Specimen: BLOOD LEFT FOREARM  Result Value Ref Range Status   Specimen Description   Final    BLOOD LEFT FOREARM Performed at [facility], [address] [place], [place] [address]    Special Requests   Final    BOTTLES DRAWN AEROBIC AND ANAEROBIC Blood Culture results may not be optimal due to an inadequate volume of blood received in culture bottles Performed at [facility] [address], [place], [place] [address]    Culture  Setup Time   Final    GRAM POSITIVE COCCI AEROBIC BOTTLE ONLY CRITICAL VALUE NOTED.  VALUE IS CONSISTENT WITH PREVIOUSLY REPORTED AND CALLED VALUE.    Culture (A)  Final    AEROCOCCUS SPECIES Standardized susceptibility testing for this organism is not available. Performed at [facility], [place] [address] [place], [place] [address]    Report Status 09/12/2020 FINAL  Final  Blood Culture (routine x 2)     Status: Abnormal (Preliminary result)   Collection Time: 09/07/20 10:08 PM   Specimen: BLOOD LEFT WRIST  Result Value Ref Range Status   Specimen Description   Final    BLOOD LEFT WRIST Performed at [facility] [address] [place], [place] [address]    Special Requests   Final    BOTTLES DRAWN AEROBIC AND ANAEROBIC Blood Culture results may not be optimal due to an inadequate volume of blood received in culture bottles Performed at [facility] [address], [place], [place] [address]     Culture  Setup Time   Final    GRAM POSITIVE COCCI IN CLUSTERS IN BOTH AEROBIC AND ANAEROBIC BOTTLES CRITICAL RESULT CALLED TO, READ BACK BY AND VERIFIED WITH: PHARMD M.TECKER AT [id number] ON 09/09/2020 BY T.SAAD    Culture (A)  Final    AEROCOCCUS URINAE CULTURE REINCUBATED FOR BETTER GROWTH Performed at [facility], [place] [address], [place], [place] [address]    Report Status PENDING  Incomplete  Blood Culture ID Panel (Reflexed)     Status: None   Collection Time: 09/07/20 10:08 PM  Result Value Ref Range Status   Enterococcus faecalis NOT DETECTED NOT DETECTED Final   Enterococcus Faecium NOT DETECTED NOT DETECTED Final   Listeria monocytogenes NOT DETECTED NOT DETECTED Final  Staphylococcus species NOT DETECTED NOT DETECTED Final   Staphylococcus aureus (BCID) NOT DETECTED NOT DETECTED Final   Staphylococcus epidermidis NOT DETECTED NOT DETECTED Final   Staphylococcus lugdunensis NOT DETECTED NOT DETECTED Final   Streptococcus species NOT DETECTED NOT DETECTED Final   Streptococcus agalactiae NOT DETECTED NOT DETECTED Final   Streptococcus pneumoniae NOT DETECTED NOT DETECTED Final   Streptococcus pyogenes NOT DETECTED NOT DETECTED Final   A.calcoaceticus-baumannii NOT DETECTED NOT DETECTED Final   Bacteroides fragilis NOT DETECTED NOT DETECTED Final   Enterobacterales NOT DETECTED NOT DETECTED Final   Enterobacter cloacae complex NOT DETECTED NOT DETECTED Final   Escherichia coli NOT DETECTED NOT DETECTED Final   Klebsiella aerogenes NOT DETECTED NOT DETECTED Final   Klebsiella oxytoca NOT DETECTED NOT DETECTED Final   Klebsiella pneumoniae NOT DETECTED NOT DETECTED Final   Proteus species NOT DETECTED NOT DETECTED Final   Salmonella species NOT DETECTED NOT DETECTED Final   Serratia marcescens NOT DETECTED NOT DETECTED Final   Haemophilus influenzae NOT DETECTED NOT DETECTED Final   Neisseria meningitidis NOT DETECTED NOT DETECTED Final   Pseudomonas aeruginosa NOT  DETECTED NOT DETECTED Final   Stenotrophomonas maltophilia NOT DETECTED NOT DETECTED Final   Candida albicans NOT DETECTED NOT DETECTED Final   Candida auris NOT DETECTED NOT DETECTED Final   Candida glabrata NOT DETECTED NOT DETECTED Final   Candida krusei NOT DETECTED NOT DETECTED Final   Candida parapsilosis NOT DETECTED NOT DETECTED Final   Candida tropicalis NOT DETECTED NOT DETECTED Final   Cryptococcus neoformans/gattii NOT DETECTED NOT DETECTED Final    Comment: Performed at [facility], [address], [place],  [address]  Resp Panel by RT-PCR (Flu A&B, Covid) Nasopharyngeal Swab     Status: None   Collection Time: 09/07/20 10:10 PM   Specimen: Nasopharyngeal Swab; Nasopharyngeal(NP) swabs in vial transport medium  Result Value Ref Range Status   SARS Coronavirus 2 by RT PCR NEGATIVE NEGATIVE Final    Comment: (NOTE) SARS-CoV-2 target nucleic acids are NOT DETECTED.  The SARS-CoV-2 RNA is generally detectable in upper respiratory specimens during the acute phase of infection. The lowest concentration of SARS-CoV-2 viral copies this assay can detect is 138 copies/mL. A negative result does not preclude SARS-Cov-2 infection and should not be used as the sole basis for treatment or other patient management decisions. A negative result may occur with  improper specimen collection/handling, submission of specimen other than nasopharyngeal swab, presence of viral mutation(s) within the areas targeted by this assay, and inadequate number of viral copies(<138 copies/mL). A negative result must be combined with clinical observations, patient history, and epidemiological information. The expected result is Negative.  Fact Sheet for Patients:  EntrepreneurPulse.com.au  Fact Sheet for Healthcare Providers:  IncredibleEmployment.be  This test is no t yet approved or cleared by the [place] FDA and  has been authorized for  detection and/or diagnosis of SARS-CoV-2 by FDA under an Emergency Use Authorization (EUA). This EUA will remain  in effect (meaning this test can be used) for the duration of the COVID-19 declaration under Section 564(b)(1) of the Act, 21 U.S.C.section 360bbb-3(b)(1), unless the authorization is terminated  or revoked sooner.       Influenza A by PCR NEGATIVE NEGATIVE Final   Influenza B by PCR NEGATIVE NEGATIVE Final    Comment: (NOTE) The Xpert Xpress SARS-CoV-2/FLU/RSV plus assay is intended as an aid in the diagnosis of influenza from Nasopharyngeal swab specimens and should not be used as a  sole basis for treatment. Nasal washings and aspirates are unacceptable for Xpert Xpress SARS-CoV-2/FLU/RSV testing.  Fact Sheet for Patients: EntrepreneurPulse.com.au  Fact Sheet for Healthcare Providers: IncredibleEmployment.be  This test is not yet approved or cleared by the [place] FDA and has been authorized for detection and/or diagnosis of SARS-CoV-2 by FDA under an Emergency Use Authorization (EUA). This EUA will remain in effect (meaning this test can be used) for the duration of the COVID-19 declaration under Section 564(b)(1) of the Act, 21 U.S.C. section 360bbb-3(b)(1), unless the authorization is terminated or revoked.  Performed at [facility], [place] [address], [place], [place] [address]   Urine Culture     Status: Abnormal   Collection Time: 09/07/20 10:40 PM   Specimen: In/Out Cath Urine  Result Value Ref Range Status   Specimen Description   Final    IN/OUT CATH URINE Performed at [facility] [address] [place], [place] [address]    Special Requests   Final    NONE Performed at [facility], [place] [address] [place],  [address]    Culture MULTIPLE SPECIES PRESENT, SUGGEST RECOLLECTION (A)  Final   Report Status 09/09/2020 FINAL  Final      Serology:    Imaging: If present, new imagings (plain films, ct scans, and mri) have been personally visualized and interpreted; radiology reports have been reviewed. Decision making incorporated into the Impression / Recommendations.  7/25 cxr Unchanged cardiomediastinal silhouette. Aortic arch calcifications. There is no focal airspace disease. There is no large pleural effusion or visible pneumothorax. There is no acute osseous abnormality. Dextroconvex curvature of the spine.   IMPRESSION: No focal airspace consolidation.    Jabier Mutton, [facility] for Infectious [place] [phone] pager    09/13/2020, 12:23 PM

## 2020-09-13 NOTE — Progress Notes (Signed)
Pharmacy Antibiotic Note  Michelle [name] is a 87 y.o. female admitted on 09/07/2020 with urosepsis. Now found to have aerococcus urinae bacteremia. Pharmacy has been consulted for vancomycin dosing.  Plan: Vancomycin 750 mg IV now, then 500 mg IV q24 hr (est AUC 521 based on SCr rounded to 1.0; Vd 0.72) Measure vancomycin AUC at steady state as indicated SCr q48 while on vanc   Height: '5\' 1"'$  (154.9 cm) Weight: 47.1 kg (103 lb 13.4 oz) IBW/kg (Calculated) : 47.8  Temp (24hrs), Avg:98.5 F (36.9 C), Min:98.1 F (36.7 C), Max:98.9 F (37.2 C)  Recent Labs  Lab 09/07/20 2208 09/08/20 0339 09/09/20 0604 09/09/20 1619 09/10/20 0600 09/11/20 0654 09/12/20 0658 09/13/20 0734  WBC 6.2   < > 5.9 5.1 4.9 5.3 4.7 5.4  CREATININE 0.91   < > 0.65  --  0.70 0.69 0.75 0.73  LATICACIDVEN 1.2  --   --   --   --   --   --   --    < > = values in this interval not displayed.    Estimated Creatinine Clearance: 36.1 mL/min (by C-G formula based on SCr of 0.73 mg/dL).    No Known Allergies  Antimicrobials this admission: 7/25 Ceftriaxone >> 7/29 Ampcillin >> 7/31 vancomycin  Dose adjustments this admission: N/a  Microbiology results: 7/25 BCx: 3/4 bottles Aerococcus urinae; susc not readily available 7/25 UCx: mx spp   Thank you for allowing pharmacy to be a part of this patient's care.  Namish Krise A 09/13/2020 11:37 AM

## 2020-09-14 ENCOUNTER — Other Ambulatory Visit (HOSPITAL_COMMUNITY): Payer: Self-pay

## 2020-09-14 ENCOUNTER — Telehealth: Payer: Self-pay | Admitting: Oncology

## 2020-09-14 ENCOUNTER — Inpatient Hospital Stay (HOSPITAL_COMMUNITY): Payer: Medicare HMO

## 2020-09-14 ENCOUNTER — Other Ambulatory Visit: Payer: Self-pay | Admitting: Oncology

## 2020-09-14 DIAGNOSIS — R7881 Bacteremia: Secondary | ICD-10-CM

## 2020-09-14 DIAGNOSIS — D693 Immune thrombocytopenic purpura: Secondary | ICD-10-CM | POA: Diagnosis not present

## 2020-09-14 DIAGNOSIS — N390 Urinary tract infection, site not specified: Secondary | ICD-10-CM | POA: Diagnosis not present

## 2020-09-14 DIAGNOSIS — C50412 Malignant neoplasm of upper-outer quadrant of left female breast: Secondary | ICD-10-CM | POA: Diagnosis not present

## 2020-09-14 DIAGNOSIS — D84821 Immunodeficiency due to drugs: Secondary | ICD-10-CM | POA: Diagnosis not present

## 2020-09-14 DIAGNOSIS — Z170 Estrogen receptor positive status [ER+]: Secondary | ICD-10-CM

## 2020-09-14 DIAGNOSIS — A419 Sepsis, unspecified organism: Secondary | ICD-10-CM | POA: Diagnosis not present

## 2020-09-14 LAB — ECHOCARDIOGRAM COMPLETE
AR max vel: 0.49 cm2
AV Area VTI: 0.46 cm2
AV Area mean vel: 0.45 cm2
AV Mean grad: 16.7 mmHg
AV Peak grad: 27 mmHg
Ao pk vel: 2.6 m/s
Area-P 1/2: 3.06 cm2
Height: 61 in
P 1/2 time: 289 ms
Weight: 1661.39 [oz_av]

## 2020-09-14 LAB — RENAL FUNCTION PANEL
Albumin: 2.4 g/dL — ABNORMAL LOW (ref 3.5–5.0)
Anion gap: 8 (ref 5–15)
BUN: 16 mg/dL (ref 8–23)
CO2: 27 mmol/L (ref 22–32)
Calcium: 8.4 mg/dL — ABNORMAL LOW (ref 8.9–10.3)
Chloride: 101 mmol/L (ref 98–111)
Creatinine, Ser: 0.74 mg/dL (ref 0.44–1.00)
GFR, Estimated: >60 mL/min
Glucose, Bld: 128 mg/dL — ABNORMAL HIGH (ref 70–99)
Phosphorus: 3.6 mg/dL (ref 2.5–4.6)
Potassium: 4.3 mmol/L (ref 3.5–5.1)
Sodium: 136 mmol/L (ref 135–145)

## 2020-09-14 LAB — CBC
HCT: 36.1 % (ref 36.0–46.0)
Hemoglobin: 11.7 g/dL — ABNORMAL LOW (ref 12.0–15.0)
MCH: 30.3 pg (ref 26.0–34.0)
MCHC: 32.4 g/dL (ref 30.0–36.0)
MCV: 93.5 fL (ref 80.0–100.0)
Platelets: 74 10*3/uL — ABNORMAL LOW (ref 150–400)
RBC: 3.86 MIL/uL — ABNORMAL LOW (ref 3.87–5.11)
RDW: 17.4 % — ABNORMAL HIGH (ref 11.5–15.5)
WBC: 7.4 10*3/uL (ref 4.0–10.5)
nRBC: 0 % (ref 0.0–0.2)

## 2020-09-14 LAB — MAGNESIUM: Magnesium: 2 mg/dL (ref 1.7–2.4)

## 2020-09-14 MED ORDER — MELATONIN 3 MG PO TABS
3.0000 mg | ORAL_TABLET | Freq: Once | ORAL | Status: AC
  Administered 2020-09-15: 3 mg via ORAL
  Filled 2020-09-14: qty 1

## 2020-09-14 NOTE — Progress Notes (Signed)
Occupational Therapy Treatment Patient Details Name: Michelle [name] MRN: [id number] DOB: August 31, 1930 Today's Date: 09/14/2020    History of present illness patient is a 87 year old female who presented to the hospital on 7/26 with shortness of breath and breast pain. patient was admitted for sepsis secondary to urinary tract infection and thrombocytopenia. [id number] cancer and HTN   OT comments  Patient was seated in recliner in room at start of session. Patient was educated on importance of sitting as upright as possible in chair for meals. Patient verbalized understanding with mod A for positioning in recliner. Patient and daughter were educated on importance of patient participating in tasks first then offering/asking for assistance. Patients daughter verbalized understanding. Patient was able to complete self feeding tasks after set up with increased time. Patient d/c plan remains appropriate.   Follow Up Recommendations  SNF (if family will agree)    Equipment Recommendations  Other (comment)    Recommendations for Other Services      Precautions / Restrictions Precautions Precautions: Fall Restrictions Weight Bearing Restrictions: No       Mobility Bed Mobility                    Transfers                      Balance Overall balance assessment: Needs assistance Sitting-balance support: Feet supported Sitting balance-Leahy Scale: Fair                                     ADL either performed or assessed with clinical judgement   ADL Overall ADL's : Needs assistance/impaired Eating/Feeding: Set up;Sitting Eating/Feeding Details (indicate cue type and reason): patient was seated in recliner at start of session. patient required set up and continued education to patient and daughter about learned helplessness and importance of having patient trial tasks like grooming and self feeding prior to completing them for patient. patietn and daughter  verbalized understanding.                                         Vision       Perception     Praxis      Cognition Arousal/Alertness: Awake/alert Behavior During Therapy: Flat affect Overall Cognitive Status: Within Functional Limits for tasks assessed                                          Exercises     Shoulder Instructions       General Comments      Pertinent Vitals/ Pain       Pain Assessment: No/denies pain  Home Living                                          Prior Functioning/Environment              Frequency  Min 2X/week        Progress Toward Goals  OT Goals(current goals can now be found in the care plan section)  Progress towards OT goals: Progressing toward goals  Acute Rehab OT Goals Patient Stated Goal: Have less pain OT Goal Formulation: With patient/family Time For Goal Achievement: 09/23/20 Potential to Achieve Goals: [place]  Plan Discharge plan remains appropriate    Co-evaluation                 AM-PAC OT "6 Clicks" Daily Activity     Outcome Measure   Help from another person eating meals?: A Little Help from another person taking care of personal grooming?: A Little Help from another person toileting, which includes using toliet, bedpan, or urinal?: Total Help from another person bathing (including washing, rinsing, drying)?: A Lot Help from another person to put on and taking off regular upper body clothing?: A Lot Help from another person to put on and taking off regular lower body clothing?: Total 6 Click Score: 12    End of Session    OT Visit Diagnosis: Muscle weakness (generalized) (M62.81);Pain   Activity Tolerance Patient tolerated treatment well   Patient Left in chair;with call bell/phone within reach;with chair alarm set;with family/visitor present   Nurse Communication Other (comment) (nurse cleared patient to participate)        Time:  [id number] OT Time Calculation (min): 16 min  Charges: OT General Charges $OT Visit: 1 Visit OT Treatments $Self Care/Home Management : 8-22 mins  Jackelyn Poling OTR/L, [facility] Acute Rehabilitation Department Office# [phone] Pager# [phone]    [place] 09/14/2020, 1:47 PM

## 2020-09-14 NOTE — Progress Notes (Signed)
HEMATOLOGY-ONCOLOGY PROGRESS NOTE  Michelle [name]'s platelet count continues to improve.  From a hematology point of view certainly as she could be discharged home at this point.  She remains very weak and every visit I have seen her in the hospital she has been in bed.  She does need to be mobilized and will need aggressive physical and occupational therapy.  From an infectious point of view she is now on ampicillin and vancomycin which she is tolerating well.  No family in room  Oncology History  Malignant neoplasm of upper-outer quadrant of left breast in female, estrogen receptor positive ([place])  05/17/2019 Cancer Staging   Staging form: Breast, AJCC 8th Edition - Clinical stage from 05/17/2019: Stage IIIA (cT3, cN1, cM0, G2, ER+, PR-, HER2-) - Signed by Gardenia Phlegm, NP on 05/29/2019    05/27/2019 Initial Diagnosis   Malignant neoplasm of upper-outer quadrant of left breast in female, estrogen receptor positive ([place])    Malignant neoplasm of overlapping sites of right breast in female, estrogen receptor positive ([place])  05/17/2019 Cancer Staging   Staging form: Breast, AJCC 8th Edition - Clinical stage from 05/17/2019: Stage IIA (cT2, cN0, cM0, G1, ER+, PR-, HER2-) - Signed by Gardenia Phlegm, NP on 05/29/2019    05/28/2019 Initial Diagnosis   Malignant neoplasm of overlapping sites of right breast in female, estrogen receptor positive ([place])      Past Medical History:  Diagnosis Date   Arthritis    Cancer ([place])    Bilaterial Breast Cancer   Dyspnea    GERD (gastroesophageal reflux disease)    Hypertension    Stroke ([place])    "mini stroke" per patient   Past Surgical History:  Procedure Laterality Date   ABDOMINAL HYSTERECTOMY     APPENDECTOMY     SHOULDER SURGERY     Right   Family History  Adopted: Yes    GYNECOLOGIC HISTORY:  No LMP recorded. Patient is postmenopausal. Menarche: Does not recall Age at first live birth: 87 years old [place] P 85 LMP  87s Hysterectomy?  Yes BSO?  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  Her children are Michelle [name], who lives in [place] and works for [employer] Michelle [name]) lives in [place] and runs his own business, and Michelle [name]") who lives in [place] and works for [employer]  He is also training his children to box.  Son Michelle [name] died from hepatitis C at age 11.  The patient has 11 grandchildren and more great grandchildren and great great-grandchildren than she can count.  She attends a local [place]: Not in place.  At the 05/28/2019 visit the patient was given the appropriate documents to complete and notarized at her discretion.  PHYSICAL EXAMINATION: ECOG PERFORMANCE STATUS: 2 - Symptomatic, <50% confined to bed  Vitals:   09/13/20 2040 09/14/20 0500  BP: (!) 143/74 (!) 179/46  Pulse: 73 77  Resp: 18 (!) 24  Temp: 98.7 F (37.1 C) 98.2 F (36.8 C)  SpO2: 97% 97%   Filed Weights   09/07/20 2156 09/08/20 0840  Weight: 104 lb (47.2 kg) 103 lb 13.4 oz (47.1 kg)    Intake/Output from previous day: 07/31 0701 - 08/01 0700  In: 0  Out: 1300 [Urine:1300]    LABORATORY DATA:  I have reviewed the data as listed CMP Latest Ref Rng & Units 09/14/2020 09/13/2020 09/12/2020  Glucose 70 - 99 mg/dL 128(H) 91 98  BUN 8 - 23 mg/dL '16 15 17  ' Creatinine 0.44 - 1.00 mg/dL 0.74 0.73 0.75  Sodium 135 - 145 mmol/L 136 130(L) 132(L)  Potassium 3.5 - 5.1 mmol/L 4.3 4.1 4.0  Chloride 98 - 111 mmol/L 101 95(L) 99  CO2 22 - 32 mmol/L '27 29 29  ' Calcium 8.9 - 10.3 mg/dL 8.4(L) 8.3(L) 8.1(L)  Total Protein 6.5 - 8.1 g/dL - - 7.0  Total Bilirubin 0.3 - 1.2 mg/dL - - 0.4  Alkaline Phos 38 - 126 U/L - - 70  AST 15 - 41 U/L - - 35  ALT 0 - 44 U/L - - 30    Lab Results  Component Value Date   WBC 7.4 09/14/2020   HGB 11.7 (L) 09/14/2020    HCT 36.1 09/14/2020   MCV 93.5 09/14/2020   PLT 74 (L) 09/14/2020   NEUTROABS 4.4 09/09/2020    DG Chest Port 1 View  Result Date: 09/07/2020 CLINICAL DATA:  Questionable sepsis - evaluate for abnormality EXAM: PORTABLE CHEST 1 VIEW COMPARISON:  Radiograph 07/05/2012, chest CT 06/07/2019 FINDINGS: Unchanged cardiomediastinal silhouette. Aortic arch calcifications. There is no focal airspace disease. There is no large pleural effusion or visible pneumothorax. There is no acute osseous abnormality. Dextroconvex curvature of the spine. IMPRESSION: No focal airspace consolidation. Electronically Signed   By: Maurine Simmering   On: 09/07/2020 22:01    ASSESSMENT:  A: Breast Cancer history:  87 y.o. [place] woman status post bilateral breast biopsies on 05/17/2019 as follows:             (a) on the left upper outer quadrant, a clinical T2 N2, stage IIIA invasive ductal carcinoma,grade 2 or 3, estrogen receptor positive, progesterone receptor negative, with no HER-2 amplification and an MIB-1 of 12%             (b) on the right overlapping sites, 2 morphologically distinct invasive ductal carcinomas, both clinically T1c N0, stage IA, grade 1 or 2, estrogen receptor positive, progesterone receptor negative, with no HER-2 amplification and an MIB-1 of 5%             (c) CT of the chest abdomen and pelvis 06/10/2019 shows left axillary, retropectoral and supraclavicular lymph nodes, and a 4.1 cm left breast mass; no right axillary adenopathy; 0.6 cm nonspecific left lower lobe pulmonary nodule; no liver or bone lesions.             (d) bone scan 06/26/2019--no evidence of skeletal metastases                          (1) neoadjuvant anastrozole started 05/28/2019, palbociclib added 05/30/2019 125 mg a day, 21 on 7 off             (a) palbociclib dose decreased cycle 2 (May 2021) to 100 mg daily, 21/7             (b) bilateral breast and axilla ultrasonography 10/04/2019 shows a good initial response to  treatment             (c) repeat bilateral ultrasonography 046 2022 continues to show evidence of response   (2) genetics testing declined   (3) definitive surgery to follow   (4) adjuvant radiation  to follow  (5) immune thrombocytopenia (ITP)  (A) ADAMTS 13 was 63.0 on 09/09/2020  (B) status post IVIG 09/10/2020 and 09/11/2020  (C) prednisone 40 mg daily started 09/12/2018  PLAN: [place]'s ITP is responding to treatment although somewhat sluggishly.  She of course has an intercurrent infection which is being treated aggressively.  Both the infection and its treatment can blunt the platelet response.  Anticipating discharge in the next few days we have set her up a follow-up appointment in the cancer clinic 09/17/2020.  We will repeat the platelet count at that time and start Nplate as appropriate  Please let me know if I can be of further help at this point.    LOS: 7 days   Chauncey Cruel, DNP, AGPCNP-BC, AOCNP 09/14/20   Chauncey Cruel, MD Medical Oncology and Hematology [facility] [place], [place] [address] Tel. [phone]    Fax. [phone]

## 2020-09-14 NOTE — Telephone Encounter (Signed)
Scheduled appt per 8/1 sch msg. Pt's daughter is aware.

## 2020-09-14 NOTE — Progress Notes (Signed)
PROGRESS NOTE  Michelle [name] WFU:[id number] DOB: 1930/06/22   PCP: [name], MD  Patient is from: Home.  Patient's daughter moved in to live with her recently.  DOA: 09/07/2020 LOS: 7  Chief complaints:  Chief Complaint  Patient presents with   Weakness   Breast Pain     Brief Narrative / Interim history: 87 year old F with PMH of breast cancer on Ibrance and HTN presenting with dysuria, myalgia, chest wall pain, cough, nausea and dysuria, and admitted for sepsis due to bacteremia and UTI and acute ITP.  Blood and urine culture growing Aerococcus urinae.  Initially started on IV ceftriaxone and transitioned to IV ampicillin.  She also received blood transfusions, FFP' and IVIG.  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 ID consulted on 7/31 and recommended adding IV vancomycin, repeat blood culture and TTE to exclude endocarditis.  Subjective: Seen and examined earlier this morning.  No major events overnight of this morning.  No complaints.  She likes to be left alone so she could sleep.  She denies pain, shortness of breath, GI or UTI symptoms.  Objective: Vitals:   09/13/20 1317 09/13/20 2040 09/14/20 0500 09/14/20 1332  BP: 132/61 (!) 143/74 (!) 179/46 127/61  Pulse: 89 73 77 82  Resp: (!) 22 18 (!) 24 18  Temp: 100.1 F (37.8 C) 98.7 F (37.1 C) 98.2 F (36.8 C) 98.3 F (36.8 C)  TempSrc: Oral Oral Oral Oral  SpO2: 91% 97% 97% 95%  Weight:      Height:        Intake/Output Summary (Last 24 hours) at 09/14/2020 1336 Last data filed at 09/14/2020 0931 Gross per 24 hour  Intake 240 ml  Output 1300 ml  Net -1060 ml   Filed Weights   09/07/20 2156 09/08/20 0840  Weight: 47.2 kg 47.1 kg    Examination:  GENERAL: Frail and chronically ill-appearing.  Nontoxic. HEENT: MMM.  Vision and hearing grossly intact.  NECK: Supple.  No apparent JVD.  RESP: On RA.  No IWOB.  Fair aeration bilaterally. CVS:  RRR.  2/6 SEM over RUSB.  ABD/GI/GU: BS+. Abd soft, NTND.  MSK/EXT:   Moves extremities. No apparent deformity. No edema.  SKIN: no apparent skin lesion or wound NEURO: Awake and alert. Oriented appropriately.  No apparent focal neuro deficit. PSYCH: Calm. Normal affect.   Procedures:  None  Microbiology summarized: [id number] and influenza PCR nonreactive. Blood and urine culture with Aerococcus urinae Repeat blood culture-NGTD. Repeat urine culture pending  Assessment & Plan: Severe sepsis due to Aerococcus urinae bacteremia and UTI in immunocompromised patient: POA. Met criteria on presentation with SIRS, hyperbilirubinemia and thrombocytopenia.  Clinically improving although very weak.  -Appreciate ID recs. -IV ceftriaxone>>> IV ampicillin 7/31>> -IV vancomycin 7/31-8/1 -TTE-pending -Repeat blood culture-NGTD. 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 Improving. Recent Labs  Lab 09/07/20 2208 09/07/20 2325 09/08/20 0339 09/09/20 0604 09/09/20 1619 09/10/20 0600 09/11/20 0654 09/12/20 0658 09/13/20 0734 09/14/20 0510  PLT 31* 28* 22* 23* 28* 27* 38* 43* 52* 74*  -Treat sepsis as above.  -Received 4 units of FFP -S/p IVIG 7/28 and 7/30 -NPLATE on 8/4 either inpatient or outpatient per oncology. -Continue p.o. prednisone 53m PO daily until follow-up with oncology on 8/4. -Okay to discharge if platelets > 50,000 per heme-onc.   Hx breast cancer: Followed by Dr. MJana Hakimoutpatient.  On Ibrance and Arimidex -Oncology following as above -Holding Ibrance in the setting of  sepsis.  Anemia of chronic disease: H&H stable after 3 units. Recent Labs    05/27/20 1401 09/07/20 2208 09/08/20 0339 09/09/20 0604 09/09/20 1619 09/10/20 0600 09/11/20 0654 09/12/20 0658 09/13/20 0734 09/14/20 0510  HGB 9.6* 9.2* 8.4* 7.3* 7.5* 10.1* 10.1* 10.0* 9.9* 11.7*  -Monitor H&H  Essential hypertension: Normotensive. -Continue holding losartan and HCTZ -Consider amlodipine with Lasix if BP  elevated.  Hyponatremia: Resolved. -Continue holding losartan and HCTZ.   Hypokalemia: Resolved.   Osteoarthritis -Scheduled Tylenol  Acute metabolic encephalopathy: Delirium?  Improved. -Discontinued tramadol and scheduled Tylenol for pain -Reorientation and delirium precautions.   HLD: On pravastatin outpatient, will hold for now.  Not sure if indicated   Weakness/fatigue/deconditioning: -PT/OT recommending SNF but she prefers to go home when medically ready. Family in agreement.  Goal of care counseling: patient with comorbidities as above.  Poor long-term prognosis.  She is very frail.  Still full code.  Briefly discussed about CODE STATUS with patient's daughter at bedside on 7/31.  Patient's daughter would like to have her brothers involved.  Unfortunately, I was not able to reach her brother (patient's son) over the phone. -Palliative care consulted for assistance with goals of care discussion   Severe protein calorie malnutrition/decreased p.o. intake: POA -Gentle IV fluid -P.o. Remeron 15 mg nightly Body mass index is 19.62 kg/m. Nutrition Problem: Increased nutrient needs Etiology: acute illness Signs/Symptoms: estimated needs Interventions: Ensure Enlive (each supplement provides 350kcal and 20 grams of protein), MVI   DVT prophylaxis:  SCDs Start: 09/07/20 2328 due to thrombocytopenia  Code Status: Full code Family Communication: Patient and RN.  No family at bedside today. Level of care: Telemetry Status is: Inpatient  Remains inpatient appropriate because:Ongoing diagnostic testing needed not appropriate for outpatient work up, IV treatments appropriate due to intensity of illness or inability to take PO, and Inpatient level of care appropriate due to severity of illness  Dispo: The patient is from: Home              Anticipated d/c is to: Home with [facility]              Patient currently is not medically stable to d/c.   Difficult to place patient  No       Consultants:  Hematology/oncology Infectious disease Palliative medicine   Sch Meds:  Scheduled Meds:  feeding supplement  237 mL Oral BID BM   mirtazapine  15 mg Oral QHS   multivitamin with minerals  1 tablet Oral Daily   phenazopyridine  100 mg Oral TID WC   predniSONE  40 mg Oral Q breakfast   Continuous Infusions:  sodium chloride 60 mL/hr at 09/13/20 1254   ampicillin (OMNIPEN) IV 2 g (09/14/20 0808)   PRN Meds:.acetaminophen **OR** acetaminophen, lip balm, ondansetron **OR** ondansetron (ZOFRAN) IV, sodium chloride flush  Antimicrobials: Anti-infectives (From admission, onward)    Start     Dose/Rate Route Frequency Ordered Stop   09/14/20 0600  vancomycin (VANCOREADY) IVPB 500 mg/100 mL  Status:  Discontinued        500 mg 100 mL/hr over 60 Minutes Intravenous Every 24 hours 09/13/20 1135 09/14/20 0922   09/13/20 1400  ampicillin (OMNIPEN) 2 g in sodium chloride 0.9 % 100 mL IVPB        2 g 300 mL/hr over 20 Minutes Intravenous Every 6 hours 09/13/20 1246     09/13/20 1330  ampicillin (OMNIPEN) 2 g in sodium chloride 0.9 % 100 mL IVPB  Status:  Discontinued        2 g 300 mL/hr over 20 Minutes Intravenous Every 6 hours 09/13/20 1232 09/13/20 1236   09/13/20 1230  vancomycin (VANCOREADY) IVPB 750 mg/150 mL        750 mg 150 mL/hr over 60 Minutes Intravenous  Once 09/13/20 1135 09/13/20 1356   09/11/20 1800  ampicillin (OMNIPEN) 2 g in sodium chloride 0.9 % 100 mL IVPB  Status:  Discontinued        2 g 300 mL/hr over 20 Minutes Intravenous Every 6 hours 09/11/20 1433 09/13/20 1113   09/11/20 1200  ampicillin (OMNIPEN) 2 g in sodium chloride 0.9 % 100 mL IVPB  Status:  Discontinued        2 g 300 mL/hr over 20 Minutes Intravenous Every 6 hours 09/11/20 1049 09/11/20 1433   09/09/20 2200  cefTRIAXone (ROCEPHIN) 2 g in sodium chloride 0.9 % 100 mL IVPB  Status:  Discontinued        2 g 200 mL/hr over 30 Minutes Intravenous Every 24 hours 09/09/20 0834  09/09/20 1109   09/09/20 1400  cefTRIAXone (ROCEPHIN) 2 g in sodium chloride 0.9 % 100 mL IVPB  Status:  Discontinued        2 g 200 mL/hr over 30 Minutes Intravenous Every 24 hours 09/09/20 1109 09/11/20 1049   09/08/20 2200  cefTRIAXone (ROCEPHIN) 1 g in sodium chloride 0.9 % 100 mL IVPB  Status:  Discontinued        1 g 200 mL/hr over 30 Minutes Intravenous Every 24 hours 09/07/20 2333 09/09/20 0834   09/07/20 2245  cefTRIAXone (ROCEPHIN) 1 g in sodium chloride 0.9 % 100 mL IVPB        1 g 200 mL/hr over 30 Minutes Intravenous  Once 09/07/20 2241 09/08/20 0001        I have personally reviewed the following labs and images: CBC: Recent Labs  Lab 09/07/20 2208 09/07/20 2325 09/09/20 1619 09/10/20 0600 09/11/20 0654 09/12/20 0658 09/13/20 0734 09/14/20 0510  WBC 6.2   < > 5.1 4.9 5.3 4.7 5.4 7.4  NEUTROABS 5.3  --  4.4  --   --   --   --   --   HGB 9.2*   < > 7.5* 10.1* 10.1* 10.0* 9.9* 11.7*  HCT 27.8*   < > 21.9* 29.9* 30.2* 30.3* 29.4* 36.1  MCV 101.1*   < > 97.8 89.5 91.5 92.9 92.5 93.5  PLT 31*   < > 28* 27* 38* 43* 52* 74*   < > = values in this interval not displayed.   BMP &GFR Recent Labs  Lab 09/09/20 0604 09/10/20 0600 09/11/20 0654 09/12/20 0658 09/13/20 0734 09/14/20 0510  NA 136 135 134* 132* 130* 136  K 3.8 4.0 4.2 4.0 4.1 4.3  CL 99 101 100 99 95* 101  CO2 _0 GLUCOSE 102* 216* 102* 98 91 128*  BUN _1 CREATININE 0.65 0.70 0.69 0.75 0.73 0.74  CALCIUM 8.6* 8.4* 8.2* 8.1* 8.3* 8.4*  MG 1.8 2.0 1.7  --  1.8 2.0  PHOS  --   --   --   --  2.4* 3.6   Estimated Creatinine Clearance: 36.1 mL/min (by C-G formula based on SCr of 0.74 mg/dL). Liver & Pancreas: Recent Labs  Lab 09/08/20 3086 09/09/20 0604 09/09/20 5784 09/10/20 0600 09/11/20 0654 09/12/20 0658 09/13/20 0734 09/14/20 0510  AST 27 26  --  26 42* 35  --   --   ALT 21 18  --  22 33 30  --   --   ALKPHOS 61 57  --  72 66 70  --   --   BILITOT 1.4*  0.9 1.0 0.8 0.5 0.4  --   --   PROT 5.7* 5.0*  --  5.5* 6.4* 7.0  --   --   ALBUMIN 2.6* 2.4*  --  2.5* 2.3* 2.1* 2.1* 2.4*   No results for input(s): LIPASE, AMYLASE in the last 168 hours. No results for input(s): AMMONIA in the last 168 hours. Diabetic: No results for input(s): HGBA1C in the last 72 hours. No results for input(s): GLUCAP in the last 168 hours. Cardiac Enzymes: No results for input(s): CKTOTAL, CKMB, CKMBINDEX, TROPONINI in the last 168 hours. No results for input(s): PROBNP in the last 8760 hours. Coagulation Profile: Recent Labs  Lab 09/07/20 2208 09/07/20 2325 09/08/20 0339  INR 1.2 1.2 1.1   Thyroid Function Tests: No results for input(s): TSH, T4TOTAL, FREET4, T3FREE, THYROIDAB in the last 72 hours. Lipid Profile: No results for input(s): CHOL, HDL, LDLCALC, TRIG, CHOLHDL, LDLDIRECT in the last 72 hours. Anemia Panel: No results for input(s): VITAMINB12, FOLATE, FERRITIN, TIBC, IRON, RETICCTPCT in the last 72 hours. Urine analysis:    Component Value Date/Time   COLORURINE YELLOW 09/07/2020 2240   APPEARANCEUR CLOUDY (A) 09/07/2020 2240   LABSPEC 1.016 09/07/2020 2240   PHURINE 6.0 09/07/2020 2240   GLUCOSEU NEGATIVE 09/07/2020 2240   HGBUR MODERATE (A) 09/07/2020 2240   BILIRUBINUR NEGATIVE 09/07/2020 2240   KETONESUR 80 (A) 09/07/2020 2240   PROTEINUR 30 (A) 09/07/2020 2240   UROBILINOGEN 0.2 07/05/2012 1610   NITRITE POSITIVE (A) 09/07/2020 2240   LEUKOCYTESUR LARGE (A) 09/07/2020 2240   Sepsis Labs: Invalid input(s): PROCALCITONIN, [place]  Microbiology: Recent Results (from the past 240 hour(s))  Blood Culture (routine x 2)     Status: Abnormal   Collection Time: 09/07/20 10:08 PM   Specimen: BLOOD LEFT FOREARM  Result Value Ref Range Status   Specimen Description   Final    BLOOD LEFT FOREARM Performed at [facility], [address] [place], [place] [address]    Special Requests   Final    BOTTLES DRAWN AEROBIC AND  ANAEROBIC Blood Culture results may not be optimal due to an inadequate volume of blood received in culture bottles Performed at [facility] [address] [place], [place] [address]    Culture  Setup Time   Final    GRAM POSITIVE COCCI AEROBIC BOTTLE ONLY CRITICAL VALUE NOTED.  VALUE IS CONSISTENT WITH PREVIOUSLY REPORTED AND CALLED VALUE.    Culture (A)  Final    AEROCOCCUS SPECIES Standardized susceptibility testing for this organism is not available. Performed at [facility], [place] [address] [place], [place] [address]    Report Status 09/12/2020 FINAL  Final  Blood Culture (routine x 2)     Status: Abnormal (Preliminary result)   Collection Time: 09/07/20 10:08 PM   Specimen: BLOOD LEFT WRIST  Result Value Ref Range Status   Specimen Description   Final    BLOOD LEFT WRIST Performed at [facility] [address] [place], [place] [address]    Special Requests   Final    BOTTLES DRAWN AEROBIC AND ANAEROBIC Blood Culture results may not be optimal due to an inadequate volume of blood received in culture bottles Performed  at [facility], [place] [address] [place], [place] [address]    Culture  Setup Time   Final    GRAM POSITIVE COCCI IN CLUSTERS IN BOTH AEROBIC AND ANAEROBIC BOTTLES CRITICAL RESULT CALLED TO, READ BACK BY AND VERIFIED WITH: PHARMD M.TECKER AT 3244 ON 09/09/2020 BY T.SAAD    Culture (A)  Final    AEROCOCCUS URINAE CULTURE REINCUBATED FOR BETTER GROWTH Performed at [facility], [place] [address] [place], [place] [address]    Report Status PENDING  Incomplete  Blood Culture ID Panel (Reflexed)     Status: None   Collection Time: 09/07/20 10:08 PM  Result Value Ref Range Status   Enterococcus faecalis NOT DETECTED NOT DETECTED Final   Enterococcus Faecium NOT DETECTED NOT DETECTED Final   Listeria monocytogenes NOT DETECTED NOT DETECTED Final   Staphylococcus species NOT DETECTED NOT DETECTED Final    Staphylococcus aureus (BCID) NOT DETECTED NOT DETECTED Final   Staphylococcus epidermidis NOT DETECTED NOT DETECTED Final   Staphylococcus lugdunensis NOT DETECTED NOT DETECTED Final   Streptococcus species NOT DETECTED NOT DETECTED Final   Streptococcus agalactiae NOT DETECTED NOT DETECTED Final   Streptococcus pneumoniae NOT DETECTED NOT DETECTED Final   Streptococcus pyogenes NOT DETECTED NOT DETECTED Final   A.calcoaceticus-baumannii NOT DETECTED NOT DETECTED Final   Bacteroides fragilis NOT DETECTED NOT DETECTED Final   Enterobacterales NOT DETECTED NOT DETECTED Final   Enterobacter cloacae complex NOT DETECTED NOT DETECTED Final   Escherichia coli NOT DETECTED NOT DETECTED Final   Klebsiella aerogenes NOT DETECTED NOT DETECTED Final   Klebsiella oxytoca NOT DETECTED NOT DETECTED Final   Klebsiella pneumoniae NOT DETECTED NOT DETECTED Final   Proteus species NOT DETECTED NOT DETECTED Final   Salmonella species NOT DETECTED NOT DETECTED Final   Serratia marcescens NOT DETECTED NOT DETECTED Final   Haemophilus influenzae NOT DETECTED NOT DETECTED Final   Neisseria meningitidis NOT DETECTED NOT DETECTED Final   Pseudomonas aeruginosa NOT DETECTED NOT DETECTED Final   Stenotrophomonas maltophilia NOT DETECTED NOT DETECTED Final   Candida albicans NOT DETECTED NOT DETECTED Final   Candida auris NOT DETECTED NOT DETECTED Final   Candida glabrata NOT DETECTED NOT DETECTED Final   Candida krusei NOT DETECTED NOT DETECTED Final   Candida parapsilosis NOT DETECTED NOT DETECTED Final   Candida tropicalis NOT DETECTED NOT DETECTED Final   Cryptococcus neoformans/gattii NOT DETECTED NOT DETECTED Final    Comment: Performed at [facility], [address], [place], [place] [address]  Resp Panel by RT-PCR (Flu A&B, Covid) Nasopharyngeal Swab     Status: None   Collection Time: 09/07/20 10:10 PM   Specimen: Nasopharyngeal Swab; Nasopharyngeal(NP) swabs in vial transport medium  Result  Value Ref Range Status   SARS Coronavirus 2 by RT PCR NEGATIVE NEGATIVE Final    Comment: (NOTE) SARS-CoV-2 target nucleic acids are NOT DETECTED.  The SARS-CoV-2 RNA is generally detectable in upper respiratory specimens during the acute phase of infection. The lowest concentration of SARS-CoV-2 viral copies this assay can detect is 138 copies/mL. A negative result does not preclude SARS-Cov-2 infection and should not be used as the sole basis for treatment or other patient management decisions. A negative result may occur with  improper specimen collection/handling, submission of specimen other than nasopharyngeal swab, presence of viral mutation(s) within the areas targeted by this assay, and inadequate number of viral copies(<138 copies/mL). A negative result must be combined with clinical observations, patient history, and epidemiological information.  The expected result is Negative.  Fact Sheet for Patients:  EntrepreneurPulse.com.au  Fact Sheet for Healthcare Providers:  IncredibleEmployment.be  This test is no t yet approved or cleared by the [place] FDA and  has been authorized for detection and/or diagnosis of SARS-CoV-2 by FDA under an Emergency Use Authorization (EUA). This EUA will remain  in effect (meaning this test can be used) for the duration of the COVID-19 declaration under Section 564(b)(1) of the Act, 21 U.S.C.section 360bbb-3(b)(1), unless the authorization is terminated  or revoked sooner.       Influenza A by PCR NEGATIVE NEGATIVE Final   Influenza B by PCR NEGATIVE NEGATIVE Final    Comment: (NOTE) The Xpert Xpress SARS-CoV-2/FLU/RSV plus assay is intended as an aid in the diagnosis of influenza from Nasopharyngeal swab specimens and should not be used as a sole basis for treatment. Nasal washings and aspirates are unacceptable for Xpert Xpress SARS-CoV-2/FLU/RSV testing.  Fact Sheet for  Patients: EntrepreneurPulse.com.au  Fact Sheet for Healthcare Providers: IncredibleEmployment.be  This test is not yet approved or cleared by the [place] FDA and has been authorized for detection and/or diagnosis of SARS-CoV-2 by FDA under an Emergency Use Authorization (EUA). This EUA will remain in effect (meaning this test can be used) for the duration of the COVID-19 declaration under Section 564(b)(1) of the Act, 21 U.S.C. section 360bbb-3(b)(1), unless the authorization is terminated or revoked.  Performed at [facility], [place] [address], [place], [place] [address]   Urine Culture     Status: Abnormal   Collection Time: 09/07/20 10:40 PM   Specimen: In/Out Cath Urine  Result Value Ref Range Status   Specimen Description   Final    IN/OUT CATH URINE Performed at [facility] [address] [place], [place] [address]    Special Requests   Final    NONE Performed at [facility], [place] [address], [place], [place] [address]    Culture MULTIPLE SPECIES PRESENT, SUGGEST RECOLLECTION (A)  Final   Report Status 09/09/2020 FINAL  Final  Culture, blood (routine x 2)     Status: None (Preliminary result)   Collection Time: 09/13/20 11:53 AM   Specimen: BLOOD  Result Value Ref Range Status   Specimen Description   Final    BLOOD LEFT ANTECUBITAL Performed at [facility] [address], [place], [place] [address]    Special Requests   Final    BOTTLES DRAWN AEROBIC ONLY Blood Culture adequate volume Performed at [facility] [address], [place], [place] [address]    Culture   Final    NO GROWTH < 24 HOURS Performed at [facility] [address] [place], [place] [address]    Report Status PENDING  Incomplete  Culture, blood (routine x 2)     Status: None (Preliminary result)   Collection Time: 09/13/20 11:53 AM   Specimen:  BLOOD  Result Value Ref Range Status   Specimen Description   Final    BLOOD BLOOD RIGHT WRIST Performed at [facility] [address], [place], [place] [address]    Special Requests   Final    BOTTLES DRAWN AEROBIC ONLY Blood Culture adequate volume Performed at [facility] [address], [place], [place] [address]    Culture   Final    NO GROWTH < 24 HOURS Performed at [facility] [address], [place], [place]  [address]    Report Status PENDING  Incomplete    Radiology Studies: No results found.    Javarion Douty T. [place]  If 7PM-7AM, please contact night-coverage [email or web address] 09/14/2020, 1:36 PM

## 2020-09-14 NOTE — Progress Notes (Signed)
[facility] for Infectious Disease  Date of Admission:  09/07/2020           Reason for visit: Follow up on aerococcus BSI  Current antibiotics: Ampicillin Vancomycin   ASSESSMENT:    Aerococcus urinae blood stream infection and suspected UTI source Breast cancer ITP  PLAN:    Continue ampicillin  Follow up susceptibilities Follow up repeat cultures Follow up TTE Will defer further vancomycin for now pending TTE.  She is clinically improving for now If chronic prednisone needed at high dose for a prolonged period she may need to consider PCP PPx   Principal Problem:   Sepsis secondary to UTI [facility]) Active Problems:   Hypertension   Malignant neoplasm of upper-outer quadrant of left breast in female, estrogen receptor positive ([place])   Thrombocytopenia ([place])   Anemia   Acute ITP ([place])   Bacteremia   Immunosuppression due to chronic steroid use ([facility])    MEDICATIONS:    Scheduled Meds: . feeding supplement  237 mL Oral BID BM  . mirtazapine  15 mg Oral QHS  . multivitamin with minerals  1 tablet Oral Daily  . phenazopyridine  100 mg Oral TID WC  . predniSONE  40 mg Oral Q breakfast   Continuous Infusions: . sodium chloride 60 mL/hr at 09/13/20 1254  . ampicillin (OMNIPEN) IV 2 g (09/14/20 0808)   PRN Meds:.acetaminophen **OR** acetaminophen, lip balm, ondansetron **OR** ondansetron (ZOFRAN) IV, sodium chloride flush  SUBJECTIVE:    No acute events noted.  Afebrile.  Reports chills and dysuria.  No other complaints.  Review of Systems  All other systems reviewed and are negative.    OBJECTIVE:   Blood pressure (!) 179/46, pulse 77, temperature 98.2 F (36.8 C), temperature source Oral, resp. rate (!) 24, height '5\' 1"'$  (1.549 m), weight 47.1 kg, SpO2 97 %. Body mass index is 19.62 kg/m.  Physical Exam Constitutional:      General: She is not in acute distress.    Appearance: Normal appearance.  HENT:     Head: Normocephalic and  atraumatic.  Pulmonary:     Effort: Pulmonary effort is normal. No respiratory distress.  Abdominal:     General: There is no distension.     Palpations: Abdomen is soft.     Tenderness: There is no abdominal tenderness.  Skin:    General: Skin is warm and dry.  Neurological:     General: No focal deficit present.     Mental Status: She is alert.     Comments: She is oriented to person and place,  not year.   Psychiatric:        Mood and Affect: Mood normal.        Behavior: Behavior normal.     Lab Results: Lab Results  Component Value Date   WBC 7.4 09/14/2020   HGB 11.7 (L) 09/14/2020   HCT 36.1 09/14/2020   MCV 93.5 09/14/2020   PLT 74 (L) 09/14/2020    Lab Results  Component Value Date   NA 136 09/14/2020   K 4.3 09/14/2020   CO2 27 09/14/2020   GLUCOSE 128 (H) 09/14/2020   BUN 16 09/14/2020   CREATININE 0.74 09/14/2020   CALCIUM 8.4 (L) 09/14/2020   GFRNONAA >60 09/14/2020   GFRAA 47 (L) 11/01/2019    Lab Results  Component Value Date   ALT 30 09/12/2020   AST 35 09/12/2020   ALKPHOS 70 09/12/2020   BILITOT 0.4 09/12/2020  No results found for: CRP  No results found for: ESRSEDRATE   I have reviewed the micro and lab results in Epic.  Imaging: No results found.   Imaging independently reviewed in Epic.    Rayn[facility] for Infectious [facility] [phone] pager 09/14/2020, 11:06 AM  I spent greater than 35 minutes with the patient including greater than 50% of time in face to face counsel of the patient and in coordination of their care.

## 2020-09-14 NOTE — Progress Notes (Signed)
PT Cancellation Note  Patient Details Name: Michelle [name] MRN: [id number] DOB: 08/19/1930   Cancelled Treatment:    Reason Eval/Treat Not Completed: Fatigue/lethargy limiting ability to participate (pt sleeping soundly in bed. Pt's daughter requested that PT not disturb pt at present. Will check back tomorrow.)   Philomena Doheny PT 09/14/2020  Acute Rehabilitation Services Pager [phone] Office [phone]

## 2020-09-15 DIAGNOSIS — C50412 Malignant neoplasm of upper-outer quadrant of left female breast: Secondary | ICD-10-CM | POA: Diagnosis not present

## 2020-09-15 DIAGNOSIS — R7881 Bacteremia: Secondary | ICD-10-CM | POA: Diagnosis not present

## 2020-09-15 DIAGNOSIS — Z515 Encounter for palliative care: Secondary | ICD-10-CM | POA: Diagnosis not present

## 2020-09-15 DIAGNOSIS — D693 Immune thrombocytopenic purpura: Secondary | ICD-10-CM | POA: Diagnosis not present

## 2020-09-15 DIAGNOSIS — Z7189 Other specified counseling: Secondary | ICD-10-CM | POA: Diagnosis not present

## 2020-09-15 DIAGNOSIS — A419 Sepsis, unspecified organism: Secondary | ICD-10-CM | POA: Diagnosis not present

## 2020-09-15 DIAGNOSIS — N390 Urinary tract infection, site not specified: Secondary | ICD-10-CM | POA: Diagnosis not present

## 2020-09-15 LAB — RENAL FUNCTION PANEL
Albumin: 2.1 g/dL — ABNORMAL LOW (ref 3.5–5.0)
Anion gap: 8 (ref 5–15)
BUN: 20 mg/dL (ref 8–23)
CO2: 26 mmol/L (ref 22–32)
Calcium: 8.1 mg/dL — ABNORMAL LOW (ref 8.9–10.3)
Chloride: 103 mmol/L (ref 98–111)
Creatinine, Ser: 0.79 mg/dL (ref 0.44–1.00)
GFR, Estimated: >60 mL/min (ref >60)
Glucose, Bld: 93 mg/dL (ref 70–99)
Phosphorus: 1.7 mg/dL — ABNORMAL LOW (ref 2.5–4.6)
Potassium: 3.8 mmol/L (ref 3.5–5.1)
Sodium: 137 mmol/L (ref 135–145)

## 2020-09-15 LAB — CBC
HCT: 30.3 % — ABNORMAL LOW (ref 36.0–46.0)
Hemoglobin: 9.7 g/dL — ABNORMAL LOW (ref 12.0–15.0)
MCH: 30.4 pg (ref 26.0–34.0)
MCHC: 32 g/dL (ref 30.0–36.0)
MCV: 95 fL (ref 80.0–100.0)
Platelets: 83 10*3/uL — ABNORMAL LOW (ref 150–400)
RBC: 3.19 MIL/uL — ABNORMAL LOW (ref 3.87–5.11)
RDW: 17.4 % — ABNORMAL HIGH (ref 11.5–15.5)
WBC: 7.1 10*3/uL (ref 4.0–10.5)
nRBC: 0 % (ref 0.0–0.2)

## 2020-09-15 LAB — MAGNESIUM: Magnesium: 1.9 mg/dL (ref 1.7–2.4)

## 2020-09-15 LAB — URINE CULTURE: Culture: NO GROWTH

## 2020-09-15 MED ORDER — BARRIER CREAM NON-SPECIFIED
1.0000 "application " | TOPICAL_CREAM | Freq: Three times a day (TID) | TOPICAL | Status: DC
  Administered 2020-09-15 – 2020-09-16 (×5): 1 via TOPICAL
  Filled 2020-09-15: qty 1

## 2020-09-15 MED ORDER — AMLODIPINE BESYLATE 5 MG PO TABS
5.0000 mg | ORAL_TABLET | Freq: Every day | ORAL | Status: DC
  Administered 2020-09-15 – 2020-09-16 (×2): 5 mg via ORAL
  Filled 2020-09-15 (×2): qty 1

## 2020-09-15 MED ORDER — POTASSIUM PHOSPHATES 15 MMOLE/5ML IV SOLN
30.0000 mmol | Freq: Once | INTRAVENOUS | Status: AC
  Administered 2020-09-15: 30 mmol via INTRAVENOUS
  Filled 2020-09-15: qty 10

## 2020-09-15 MED ORDER — AMOXICILLIN 250 MG PO CAPS
1000.0000 mg | ORAL_CAPSULE | Freq: Three times a day (TID) | ORAL | Status: DC
  Administered 2020-09-15 – 2020-09-16 (×5): 1000 mg via ORAL
  Filled 2020-09-15 (×5): qty 4

## 2020-09-15 NOTE — Progress Notes (Signed)
[facility]  Date of Admission:  09/07/2020           Reason for visit: Follow up on Aerococcus bloodstream infection  Current antibiotics: Ampicillin     ASSESSMENT:    Aerococcus bloodstream infection and suspected UTI source Breast cancer ITP  PLAN:    Overall suspicion for endocarditis is low with unremarkable TTE and rapid clearance of blood cultures. Will transition to oral amoxicillin 1 g every 8 hours for 2 weeks from negative blood cultures.  End date 09/28/2020 We will sign off, please call as needed.   Principal Problem:   Sepsis secondary to UTI [facility]) Active Problems:   Hypertension   Malignant neoplasm of upper-outer quadrant of left breast in female, estrogen receptor positive (HCC)   Thrombocytopenia (HCC)   Anemia   Acute ITP ([place])   Bacteremia   Immunosuppression due to chronic steroid use (HCC)    MEDICATIONS:    Scheduled Meds: . barrier cream  1 application Topical TID  . feeding supplement  237 mL Oral BID BM  . mirtazapine  15 mg Oral QHS  . multivitamin with minerals  1 tablet Oral Daily  . predniSONE  40 mg Oral Q breakfast   Continuous Infusions: . sodium chloride 60 mL/hr at 09/13/20 1254  . ampicillin (OMNIPEN) IV 2 g (09/15/20 0815)  . potassium PHOSPHATE IVPB (in mmol) 30 mmol (09/15/20 0958)   PRN Meds:.acetaminophen **OR** acetaminophen, lip balm, ondansetron **OR** ondansetron (ZOFRAN) IV, sodium chloride flush  SUBJECTIVE:   24 hour events:  No acute events noted overnight Afebrile WBC 7.1 Repeat blood cultures are no growth.  Urine culture no growth. TTE without clear vegetation but noted to be technically difficult study  No current abdominal pain.  Endorses dysuria.  Denies fever, chills.   Review of Systems  All other systems reviewed and are negative.    OBJECTIVE:   Blood pressure (!) 152/73, pulse 93, temperature 98.8 F (37.1 C), temperature source Oral, resp. rate 17,  height '5\' 1"'$  (1.549 m), weight 47.1 kg, SpO2 95 %. Body mass index is 19.62 kg/m.  Physical Exam Constitutional:      General: She is not in acute distress.    Appearance: Normal appearance.     Comments: Sitting up in chair.  NAD.   HENT:     Head: Normocephalic and atraumatic.  Eyes:     Extraocular Movements: Extraocular movements intact.     Conjunctiva/sclera: Conjunctivae normal.  Cardiovascular:     Rate and Rhythm: Normal rate and regular rhythm.  Pulmonary:     Effort: Pulmonary effort is normal. No respiratory distress.     Breath sounds: Normal breath sounds.  Abdominal:     General: There is no distension.     Palpations: Abdomen is soft.     Tenderness: There is no abdominal tenderness.  Musculoskeletal:        General: Normal range of motion.  Neurological:     General: No focal deficit present.     Mental Status: She is alert. Mental status is at baseline.  Psychiatric:        Mood and Affect: Mood normal.        Behavior: Behavior normal.     Lab Results: Lab Results  Component Value Date   WBC 7.1 09/15/2020   HGB 9.7 (L) 09/15/2020   HCT 30.3 (L) 09/15/2020   MCV 95.0 09/15/2020   PLT 83 (L) 09/15/2020  Lab Results  Component Value Date   NA 137 09/15/2020   K 3.8 09/15/2020   CO2 26 09/15/2020   GLUCOSE 93 09/15/2020   BUN 20 09/15/2020   CREATININE 0.79 09/15/2020   CALCIUM 8.1 (L) 09/15/2020   GFRNONAA >60 09/15/2020   GFRAA 47 (L) 11/01/2019    Lab Results  Component Value Date   ALT 30 09/12/2020   AST 35 09/12/2020   ALKPHOS 70 09/12/2020   BILITOT 0.4 09/12/2020    No results found for: CRP  No results found for: ESRSEDRATE   I have reviewed the micro and lab results in Epic.  Imaging: ECHOCARDIOGRAM COMPLETE  Result Date: 09/14/2020    ECHOCARDIOGRAM REPORT   Patient Name:   Michelle [name] Date of Exam: 09/14/2020 Medical Rec #:  [id number]      Height:       61.0 in Accession #:    [id number]     Weight:       103.8 lb  Date of Birth:  1930/10/30       BSA:          1.430 m Patient Age:    87 years       BP:           127/61 mmHg Patient Gender: F              HR:           82 bpm. Exam Location:  Inpatient Procedure: Cardiac Doppler, Color Doppler and 2D Echo Indications:    Bacteremia R78.81  History:        Patient has prior history of Echocardiogram examinations, most                 recent 07/12/2014. Risk Factors:Hypertension. Past history of                 breast cancer. Severe sepsis due to Aerococcus urinae                 bacteremia.  Sonographer:    Darlina Sicilian RDCS Referring Phys: [id number] Charlesetta Ivory GONFA  Sonographer Comments: Technically limited study due to chest wall pain. IMPRESSIONS  1. Left ventricular ejection fraction, by estimation, is 60 to 65%. The left ventricle has normal function. The left ventricle has no regional wall motion abnormalities. There is mild left ventricular hypertrophy. Left ventricular diastolic parameters are indeterminate.  2. Right ventricule is poorly visualized but appears grossly normal size and systolic function. Tricuspid regurgitation signal is inadequate for assessing PA pressure.  3. The mitral valve is grossly normal. No evidence of mitral valve regurgitation. Mild mitral stenosis. MG 52mHg at HR 86bpm.  4. The aortic valve was not well visualized. Aortic valve regurgitation is trivial. Moderate aortic valve stenosis. Vmax 3.3 m/s, MG 26 mmHg, DI 0.38  5. The inferior vena cava is normal in size with greater than 50% respiratory variability, suggesting right atrial pressure of 3 mmHg. Conclusion(s)/Recommendation(s): No clear vegetation seen, but technically difficult study with limited views. If clinical suspicion for endocarditis, recommend TEE. FINDINGS  Left Ventricle: Left ventricular ejection fraction, by estimation, is 60 to 65%. The left ventricle has normal function. The left ventricle has no regional wall motion abnormalities. The left ventricular internal cavity size  was normal in size. There is  mild left ventricular hypertrophy. Left ventricular diastolic parameters are indeterminate. Right Ventricle: The right ventricular size is normal. Right vetricular wall thickness was not well visualized. Right ventricular  systolic function is normal. Tricuspid regurgitation signal is inadequate for assessing PA pressure. Left Atrium: Left atrial size was normal in size. Right Atrium: Right atrial size was not well visualized. Pericardium: There is no evidence of pericardial effusion. Mitral Valve: The mitral valve is grossly normal. No evidence of mitral valve regurgitation. Mild mitral valve stenosis. Tricuspid Valve: The tricuspid valve is not well visualized. Tricuspid valve regurgitation is not demonstrated. Aortic Valve: The aortic valve was not well visualized. Aortic valve regurgitation is trivial. Aortic regurgitation PHT measures 289 msec. Moderate aortic stenosis is present. Aortic valve mean gradient measures 16.7 mmHg. Aortic valve peak gradient measures 27.0 mmHg. Aortic valve area, by VTI measures 0.46 cm. Pulmonic Valve: The pulmonic valve was not well visualized. Pulmonic valve regurgitation is not visualized. Aorta: The aortic root is normal in size and structure. Venous: The inferior vena cava is normal in size with greater than 50% respiratory variability, suggesting right atrial pressure of 3 mmHg. IAS/Shunts: The interatrial septum was not well visualized.  LEFT VENTRICLE PLAX 2D LVOT diam:     1.10 cm  Diastology LV SV:         23       LV e' medial:    4.80 cm/s LV SV Index:   16       LV E/e' medial:  14.4 LVOT Area:     0.95 cm LV e' lateral:   5.11 cm/s                         LV E/e' lateral: 13.6  LEFT ATRIUM           Index LA Vol (A4C): 26.9 ml 18.82 ml/m  AORTIC VALVE AV Area (Vmax):    0.49 cm AV Area (Vmean):   0.45 cm AV Area (VTI):     0.46 cm AV Vmax:           260.00 cm/s AV Vmean:          177.333 cm/s AV VTI:            0.505 m AV Peak Grad:       27.0 mmHg AV Mean Grad:      16.7 mmHg LVOT Vmax:         133.00 cm/s LVOT Vmean:        83.800 cm/s LVOT VTI:          0.245 m LVOT/AV VTI ratio: 0.49 AI PHT:            289 msec  AORTA Ao Root diam: 2.10 cm MITRAL VALVE MV Area (PHT): 3.06 cm     SHUNTS MV Decel Time: 248 msec     Systemic VTI:  0.24 m MV E velocity: 69.30 cm/s   Systemic Diam: 1.10 cm MV A velocity: 112.00 cm/s MV E/A ratio:  0.62 Oswaldo Milian MD Electronically signed by Oswaldo Milian MD Signature Date/Time: 09/14/2020/3:46:05 PM    Final      Imaging independently reviewed in Epic.    [facility]ious [facility] [phone] pager 09/15/2020, 11:40 AM

## 2020-09-15 NOTE — Consult Note (Signed)
Consultation Note Date: 09/15/2020   Patient Name: Michelle [name]  DOB: April 22, 1930  MRN: [id number]  Age / Sex: 87 y.o., female  PCP: [name], MD Referring Physician: Mercy Riding, MD  Reason for Consultation: Establishing goals of care  HPI/Patient Profile: 87 y.o. female admitted on 09/07/2020    Clinical Assessment and Goals of Care: 87 year old lady who lives at home, daughter and son are her primary caregivers, she is a patient of Dr. [name] with a history of breast cancer and was on Ibrance, also has hypertension and presented with dysuria myalgias chest wall pain cough and has been admitted to hospital medicine service for sepsis due to bacteremia urinary tract infection and acute ITP.  Medical oncology as well as infectious disease specialist are following.  Patient on antibiotics, has received blood transfusion IVIG and FFP.  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  Palliative consultation has been requested for goals of care discussions.  Patient is resting in bed.  She awakens easily and responds appropriately.  However, she states I do not know to several questions asked.  She does state that she wants to go back home towards the end of this hospitalization and does not want to go to a nursing home even for rehabilitation trial.  Call placed and I was able to reach both son and daughter: Palliative medicine is specialized medical care for people living with serious illness. It focuses on providing relief from the symptoms and stress of a serious illness. The goal is to improve quality of life for both the patient and the family. Goals of care: Broad aims of medical therapy in relation to the patient's values and preferences. Our aim is to provide medical care aimed at enabling patients to achieve the goals that matter most to them, given the circumstances of their particular medical situation and their  constraints.   Goals wishes and values important to the patient and family as a unit attempted to be explored.  Discussed about nature of current hospitalization as well as her underlying conditions, see below.  NEXT OF KIN  Son and daughter.   SUMMARY OF RECOMMENDATIONS   Full code/full scope for now Patient's son [name] requests to get in touch with TOC again regarding the long-term policy he has in place for aligning home-based health care for the patient after discharge. Patient's daughter requests for follow-up with medical oncology since she is not sure she will be able to bring the patient to the cancer center for scheduled appointment on Thursday 09-17-2020 given the patient's recent hospitalization.  Continue current mode of care for now. Thank you for the consult.   Code Status/Advance Care Planning: Full code   Symptom Management:     Palliative Prophylaxis:  Delirium Protocol  Additional Recommendations (Limitations, Scope, Preferences): Full Scope Treatment  Psycho-social/Spiritual:  Desire for further Chaplaincy support:yes Additional Recommendations: Caregiving  Support/Resources  Prognosis:  Unable to determine  Discharge Planning: Home with Home Health      Primary Diagnoses: Present on Admission:  Hypertension  Thrombocytopenia ([place])  Anemia  Sepsis secondary to UTI [facility])   I have reviewed the medical record, interviewed the patient and family, and examined the patient. The following aspects are pertinent.  Past Medical History:  Diagnosis Date   Arthritis    Cancer ([place])    Bilaterial Breast Cancer   Dyspnea    GERD (gastroesophageal reflux disease)    Hypertension    Stroke [facility])    "mini stroke" per patient   Social History   Socioeconomic History   Marital status: Widowed    Spouse name: Not on file   Number of children: Not on file   Years of education: Not on file   Highest education level: Not on file  Occupational History   Not  on file  Tobacco Use   Smoking status: Never   Smokeless tobacco: Never  Vaping Use   Vaping Use: Never used  Substance and Sexual Activity   Alcohol use: No   Drug use: No    Frequency: 3.0 times per week   Sexual activity: Not on file  Other Topics Concern   Not on file  Social History Narrative   Not on file   Social Determinants of Health   Financial Resource Strain: Not on file  Food Insecurity: Not on file  Transportation Needs: Not on file  Physical Activity: Not on file  Stress: Not on file  Social Connections: Not on file   Family History  Adopted: Yes   Scheduled Meds:  amLODipine  5 mg Oral Daily   amoxicillin  1,000 mg Oral Q8H   barrier cream  1 application Topical TID   feeding supplement  237 mL Oral BID BM   mirtazapine  15 mg Oral QHS   multivitamin with minerals  1 tablet Oral Daily   predniSONE  40 mg Oral Q breakfast   Continuous Infusions:  potassium PHOSPHATE IVPB (in mmol) 30 mmol (09/15/20 0958)   PRN Meds:.acetaminophen **OR** acetaminophen, lip balm, ondansetron **OR** ondansetron (ZOFRAN) IV, sodium chloride flush Medications Prior to Admission:  Prior to Admission medications   Medication Sig Start Date End Date Taking? Authorizing Provider  acetaminophen (TYLENOL) 500 MG tablet Take 500-1,000 mg by mouth every 6 (six) hours as needed for mild pain or headache.   Yes [provider]  anastrozole (ARIMIDEX) 1 MG tablet Take 1 tablet (1 mg total) by mouth daily. 06/26/20  Yes Magrinat, Virgie Dad, MD  Calcium Carb-Cholecalciferol (CALCIUM 600+D3) 600-800 MG-UNIT TABS Take 2 tablets by mouth in the morning.   Yes [provider]  palbociclib (IBRANCE) 100 MG tablet TAKE 1 TABLET (100 MG TOTAL) BY MOUTH DAILY. TAKE FOR 21 DAYS ON, 7 DAYS OFF, REPEAT EVERY 28 DAYS. Patient taking differently: Take 100 mg by mouth See admin instructions. TAKE 1 TABLET (100 MG TOTAL) BY MOUTH DAILY. TAKE FOR 21 DAYS ON, 7 DAYS OFF, REPEAT EVERY 28  DAYS. 02/25/20 02/24/21 Yes Magrinat, Virgie Dad, MD  diphenhydrAMINE (BENADRYL) 12.5 MG/5ML liquid Take 12.5-25 mg by mouth daily as needed for allergies.    [provider]  esomeprazole (NEXIUM) 20 MG capsule Take 20 mg by mouth daily as needed (acid reflux).    [provider]  gabapentin (NEURONTIN) 100 MG capsule Take 100 mg by mouth 3 (three) times daily. 01/01/20   [provider]  losartan-hydrochlorothiazide (HYZAAR) 100-12.5 MG per tablet Take 1 tablet by mouth in the morning.    [provider]  pravastatin (PRAVACHOL) 20 MG tablet Take  20 mg by mouth daily.    [provider]  triamcinolone ointment (KENALOG) 0.5 % Apply 1 application topically 2 (two) times daily. 12/27/19   Gardenia Phlegm, NP   No Known Allergies Review of Systems Patient complains of irritation around her pure-wick catheter which was adjusted and repositioned by bedside RN at the time of my visit.  Physical Exam Appears with generalized illness and chronically ill however awakens easily and answers all questions appropriately Regular work of breathing S1-S2 Abdomen is not distended not tender Awake, oriented calm and cooperative No edema  Vital Signs: BP (!) 140/58 (BP Location: Left Arm)   Pulse 76   Temp 97.9 F (36.6 C) (Oral)   Resp 20   Ht '5\' 1"'$  (1.549 m)   Wt 47.1 kg   SpO2 95%   BMI 19.62 kg/m  Pain Scale: 0-10 POSS *See Group Information*: 1-Acceptable,Awake and alert Pain Score: 0-No pain   SpO2: SpO2: 95 % O2 Device:SpO2: 95 % O2 Flow Rate: .   IO: Intake/output summary:  Intake/Output Summary (Last 24 hours) at 09/15/2020 1502 Last data filed at 09/15/2020 1300 Gross per 24 hour  Intake 1100 ml  Output 2050 ml  Net -950 ml    LBM: Last BM Date: 09/10/20 Baseline Weight: Weight: 47.2 kg Most recent weight: Weight: 47.1 kg     Palliative Assessment/Data:   PPS 50%  Time In:  1400 Time Out:  1500 Time Total:  60 min.   Greater than 50%  of this time was spent counseling and coordinating care related to the above assessment and plan.  Signed by: Loistine Chance, MD   Please contact Palliative Medicine Team phone at [phone] for questions and concerns.  For individual provider: See Shea Evans

## 2020-09-15 NOTE — Progress Notes (Signed)
PROGRESS NOTE  Michelle [name] MRN:[id number] DOB: 12/14/1930   PCP: Paterson, Daniel, MD  Patient is from: Home.  Patient's daughter moved in to live with her recently.  DOA: 09/07/2020 LOS: 8  Chief complaints:  Chief Complaint  Patient presents with   Weakness   Breast Pain     Brief Narrative / Interim history: 87-year-old F with PMH of breast cancer on Ibrance and HTN presenting with dysuria, myalgia, chest wall pain, cough, nausea and dysuria, and admitted for sepsis due to bacteremia and UTI and acute ITP.  Blood and urine culture growing Aerococcus urinae.  Initially started on IV ceftriaxone and transitioned to IV ampicillin.  She also received blood transfusions, FFP' and IVIG.  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 ID consulted on 7/31 and recommended IV Vanco and ampicillin, repeat blood culture and TTE.  Repeat blood and urine culture NGTD.  Vancomycin discontinued.  TTE without vegetation.  Remains on IV ampicillin.  ID following.  Subjective: Seen and examined earlier this morning.  Per overnight RN, patient had confusion and agitation overnight.  Seems to be calm today.  She is awake and oriented to self, place and year but not date or months.  Continues to endorse dysuria.  No other pain.  Denies nausea or vomiting.  Objective: Vitals:   09/14/20 0500 09/14/20 1332 09/14/20 2208 09/15/20 0616  BP: (!) 179/46 127/61 (!) 146/59 (!) 152/73  Pulse: 77 82 85 93  Resp: (!) 24 18 14 17  Temp: 98.2 F (36.8 C) 98.3 F (36.8 C) 99.2 F (37.3 C) 98.8 F (37.1 C)  TempSrc: Oral Oral Oral Oral  SpO2: 97% 95% 94% 95%  Weight:      Height:        Intake/Output Summary (Last 24 hours) at 09/15/2020 1350 Last data filed at 09/15/2020 0830 Gross per 24 hour  Intake 860 ml  Output 1050 ml  Net -190 ml   Filed Weights   09/07/20 2156 09/08/20 0840  Weight: 47.2 kg 47.1 kg    Examination:  GENERAL: Frail and chronically ill-appearing.   HEENT: MMM.  Vision and hearing grossly  intact.  NECK: Supple.  No apparent JVD.  RESP: On RA.  No IWOB.  Fair aeration bilaterally. CVS:  RRR.  2/6 SEM over RUSB. ABD/GI/GU: BS+. Abd soft, NTND.  MSK/EXT:  Moves extremities. No apparent deformity. No edema.  SKIN: no apparent skin lesion or wound NEURO: Awake.  Oriented to self, place and year.  No apparent focal neuro deficit. PSYCH: Calm. Normal affect.   Procedures:  None  Microbiology summarized: COVID-19 and influenza PCR nonreactive. Blood and urine culture with Aerococcus urinae Repeat blood culture-NGTD. Repeat urine culture-NGTD.  Assessment & Plan: Severe sepsis due to Aerococcus urinae bacteremia and UTI in immunocompromised patient: POA. Met criteria on presentation with SIRS, hyperbilirubinemia and thrombocytopenia.  Clinically improving although very weak.  TTE without vegetation.  Repeat blood culture urine culture NGTD. -Appreciate ID recs. -IV ceftriaxone>>> IV ampicillin 7/31>> -IV vancomycin 7/31-8/1 -Follow further ID recs.  Dysuria-ongoing dysuria despite appropriate antibiotics and Pyridium.  Patient was on anastrozole which could potentially cause vaginal dryness and dysuria.  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 Improving. Recent Labs  Lab 09/09/20 0604 09/09/20 1619 09/10/20 0600 09/11/20 0654 09/12/20 0658 09/13/20 0734 09/14/20 0510 09/15/20 0505  PLT 23* 28* 27* 38* 43* 52* 74* 83*  -Treat sepsis as above.  -  Received 4 units of FFP -S/p IVIG 7/28 and 7/30 -NPLATE on 8/4 either inpatient or outpatient per oncology. -Continue p.o. prednisone 40mg PO daily until follow-up with oncology on 8/4. -Okay to discharge if platelets > 50,000 per heme-onc.   Hx breast cancer: Followed by Dr. Magrinat outpatient.  On Ibrance and Arimidex -Oncology following as above -Holding Ibrance in the setting of sepsis.  Anemia of chronic disease: H&H stable  after 3 units. Recent Labs    09/07/20 2208 09/08/20 0339 09/09/20 0604 09/09/20 1619 09/10/20 0600 09/11/20 0654 09/12/20 0658 09/13/20 0734 09/14/20 0510 09/15/20 0505  HGB 9.2* 8.4* 7.3* 7.5* 10.1* 10.1* 10.0* 9.9* 11.7* 9.7*  -Monitor H&H  Essential hypertension: BP slightly elevated. -Continue holding losartan and HCTZ -Discontinue IV fluid. -Start low-dose amlodipine  Hyponatremia: Resolved. -Continue holding losartan and HCTZ.   Hypokalemia: Resolved.   Osteoarthritis -Scheduled Tylenol  Acute metabolic encephalopathy: Delirium?  Had confusion and agitation overnight. -Discontinued tramadol and scheduled Tylenol for pain -Reorientation and delirium precautions.   HLD: On pravastatin outpatient, will hold for now.  Not sure if indicated   Weakness/fatigue/deconditioning: -PT/OT recommending SNF but she prefers to go home when medically ready. Family in agreement.  Goal of care counseling: patient with comorbidities as above.  Poor long-term prognosis.  She is very frail.  Still full code.  Briefly discussed about CODE STATUS with patient's daughter at bedside on 7/31.  Patient's daughter would like to have her brothers involved.  Unfortunately, I was not able to reach her brother (patient's son) over the phone. -Palliative care consulted for assistance with goals of care discussion   Severe protein calorie malnutrition/decreased p.o. intake: POA -P.o. Remeron 15 mg nightly Body mass index is 19.62 kg/m. Nutrition Problem: Increased nutrient needs Etiology: acute illness Signs/Symptoms: estimated needs Interventions: Ensure Enlive (each supplement provides 350kcal and 20 grams of protein), MVI   DVT prophylaxis:  SCDs Start: 09/07/20 2328 due to thrombocytopenia  Code Status: Full code Family Communication: Patient and RN.  No family at bedside today. Level of care: Telemetry Status is: Inpatient  Remains inpatient appropriate because:Altered mental  status, IV treatments appropriate due to intensity of illness or inability to take PO, and Inpatient level of care appropriate due to severity of illness  Dispo: The patient is from: Home              Anticipated d/c is to: Home with HH              Patient currently is not medically stable to d/c.   Difficult to place patient No       Consultants:  Hematology/oncology Infectious disease Palliative medicine   Sch Meds:  Scheduled Meds:  amLODipine  5 mg Oral Daily   barrier cream  1 application Topical TID   feeding supplement  237 mL Oral BID BM   mirtazapine  15 mg Oral QHS   multivitamin with minerals  1 tablet Oral Daily   predniSONE  40 mg Oral Q breakfast   Continuous Infusions:  ampicillin (OMNIPEN) IV 2 g (09/15/20 0815)   potassium PHOSPHATE IVPB (in mmol) 30 mmol (09/15/20 0958)   PRN Meds:.acetaminophen **OR** acetaminophen, lip balm, ondansetron **OR** ondansetron (ZOFRAN) IV, sodium chloride flush  Antimicrobials: Anti-infectives (From admission, onward)    Start     Dose/Rate Route Frequency Ordered Stop   09/14/20 0600  vancomycin (VANCOREADY) IVPB 500 mg/100 mL  Status:  Discontinued        500 mg 100 mL/hr   over 60 Minutes Intravenous Every 24 hours 09/13/20 1135 09/14/20 0922   09/13/20 1400  ampicillin (OMNIPEN) 2 g in sodium chloride 0.9 % 100 mL IVPB        2 g 300 mL/hr over 20 Minutes Intravenous Every 6 hours 09/13/20 1246     09/13/20 1330  ampicillin (OMNIPEN) 2 g in sodium chloride 0.9 % 100 mL IVPB  Status:  Discontinued        2 g 300 mL/hr over 20 Minutes Intravenous Every 6 hours 09/13/20 1232 09/13/20 1236   09/13/20 1230  vancomycin (VANCOREADY) IVPB 750 mg/150 mL        750 mg 150 mL/hr over 60 Minutes Intravenous  Once 09/13/20 1135 09/13/20 1356   09/11/20 1800  ampicillin (OMNIPEN) 2 g in sodium chloride 0.9 % 100 mL IVPB  Status:  Discontinued        2 g 300 mL/hr over 20 Minutes Intravenous Every 6 hours 09/11/20 1433 09/13/20  1113   09/11/20 1200  ampicillin (OMNIPEN) 2 g in sodium chloride 0.9 % 100 mL IVPB  Status:  Discontinued        2 g 300 mL/hr over 20 Minutes Intravenous Every 6 hours 09/11/20 1049 09/11/20 1433   09/09/20 2200  cefTRIAXone (ROCEPHIN) 2 g in sodium chloride 0.9 % 100 mL IVPB  Status:  Discontinued        2 g 200 mL/hr over 30 Minutes Intravenous Every 24 hours 09/09/20 0834 09/09/20 1109   09/09/20 1400  cefTRIAXone (ROCEPHIN) 2 g in sodium chloride 0.9 % 100 mL IVPB  Status:  Discontinued        2 g 200 mL/hr over 30 Minutes Intravenous Every 24 hours 09/09/20 1109 09/11/20 1049   09/08/20 2200  cefTRIAXone (ROCEPHIN) 1 g in sodium chloride 0.9 % 100 mL IVPB  Status:  Discontinued        1 g 200 mL/hr over 30 Minutes Intravenous Every 24 hours 09/07/20 2333 09/09/20 0834   09/07/20 2245  cefTRIAXone (ROCEPHIN) 1 g in sodium chloride 0.9 % 100 mL IVPB        1 g 200 mL/hr over 30 Minutes Intravenous  Once 09/07/20 2241 09/08/20 0001        I have personally reviewed the following labs and images: CBC: Recent Labs  Lab 09/09/20 1619 09/10/20 0600 09/11/20 0654 09/12/20 0658 09/13/20 0734 09/14/20 0510 09/15/20 0505  WBC 5.1   < > 5.3 4.7 5.4 7.4 7.1  NEUTROABS 4.4  --   --   --   --   --   --   HGB 7.5*   < > 10.1* 10.0* 9.9* 11.7* 9.7*  HCT 21.9*   < > 30.2* 30.3* 29.4* 36.1 30.3*  MCV 97.8   < > 91.5 92.9 92.5 93.5 95.0  PLT 28*   < > 38* 43* 52* 74* 83*   < > = values in this interval not displayed.   BMP &GFR Recent Labs  Lab 09/10/20 0600 09/11/20 0654 09/12/20 0658 09/13/20 0734 09/14/20 0510 09/15/20 0505  NA 135 134* 132* 130* 136 137  K 4.0 4.2 4.0 4.1 4.3 3.8  CL 101 100 99 95* 101 103  CO2 28 27 29 29 27 26  GLUCOSE 216* 102* 98 91 128* 93  BUN 17 18 17 15 16 20  CREATININE 0.70 0.69 0.75 0.73 0.74 0.79  CALCIUM 8.4* 8.2* 8.1* 8.3* 8.4* 8.1*  MG 2.0 1.7  --  1.8 2.0 1.9    PHOS  --   --   --  2.4* 3.6 1.7*   Estimated Creatinine Clearance: 36.1  mL/min (by C-G formula based on SCr of 0.79 mg/dL). Liver & Pancreas: Recent Labs  Lab 09/09/20 0604 09/09/20 0832 09/10/20 0600 09/11/20 0654 09/12/20 0658 09/13/20 0734 09/14/20 0510 09/15/20 0505  AST 26  --  26 42* 35  --   --   --   ALT 18  --  22 33 30  --   --   --   ALKPHOS 57  --  72 66 70  --   --   --   BILITOT 0.9 1.0 0.8 0.5 0.4  --   --   --   PROT 5.0*  --  5.5* 6.4* 7.0  --   --   --   ALBUMIN 2.4*  --  2.5* 2.3* 2.1* 2.1* 2.4* 2.1*   No results for input(s): LIPASE, AMYLASE in the last 168 hours. No results for input(s): AMMONIA in the last 168 hours. Diabetic: No results for input(s): HGBA1C in the last 72 hours. No results for input(s): GLUCAP in the last 168 hours. Cardiac Enzymes: No results for input(s): CKTOTAL, CKMB, CKMBINDEX, TROPONINI in the last 168 hours. No results for input(s): PROBNP in the last 8760 hours. Coagulation Profile: No results for input(s): INR, PROTIME in the last 168 hours.  Thyroid Function Tests: No results for input(s): TSH, T4TOTAL, FREET4, T3FREE, THYROIDAB in the last 72 hours. Lipid Profile: No results for input(s): CHOL, HDL, LDLCALC, TRIG, CHOLHDL, LDLDIRECT in the last 72 hours. Anemia Panel: No results for input(s): VITAMINB12, FOLATE, FERRITIN, TIBC, IRON, RETICCTPCT in the last 72 hours. Urine analysis:    Component Value Date/Time   COLORURINE YELLOW 09/07/2020 2240   APPEARANCEUR CLOUDY (A) 09/07/2020 2240   LABSPEC 1.016 09/07/2020 2240   PHURINE 6.0 09/07/2020 2240   GLUCOSEU NEGATIVE 09/07/2020 2240   HGBUR MODERATE (A) 09/07/2020 2240   BILIRUBINUR NEGATIVE 09/07/2020 2240   KETONESUR 80 (A) 09/07/2020 2240   PROTEINUR 30 (A) 09/07/2020 2240   UROBILINOGEN 0.2 07/05/2012 1610   NITRITE POSITIVE (A) 09/07/2020 2240   LEUKOCYTESUR LARGE (A) 09/07/2020 2240   Sepsis Labs: Invalid input(s): PROCALCITONIN, [place]  Microbiology: Recent Results (from the past 240 hour(s))  Blood Culture (routine x  2)     Status: Abnormal   Collection Time: 09/07/20 10:08 PM   Specimen: BLOOD LEFT FOREARM  Result Value Ref Range Status   Specimen Description   Final    BLOOD LEFT FOREARM Performed at [facility], [address], [place], [place] [address]    Special Requests   Final    BOTTLES DRAWN AEROBIC AND ANAEROBIC Blood Culture results may not be optimal due to an inadequate volume of blood received in culture bottles Performed at [facility] [address], [place], [place] [address]    Culture  Setup Time   Final    GRAM POSITIVE COCCI AEROBIC BOTTLE ONLY CRITICAL VALUE NOTED.  VALUE IS CONSISTENT WITH PREVIOUSLY REPORTED AND CALLED VALUE.    Culture (A)  Final    AEROCOCCUS SPECIES Standardized susceptibility testing for this organism is not available. Performed at [facility], [place] [address] [place], [place] [address]    Report Status 09/12/2020 FINAL  Final  Blood Culture (routine x 2)     Status: Abnormal (Preliminary result)   Collection Time: 09/07/20 10:08 PM   Specimen: BLOOD LEFT WRIST  Result Value  Ref Range Status   Specimen Description   Final    BLOOD LEFT WRIST Performed at [facility], [place] [address], [place], [place] [address]    Special Requests   Final    BOTTLES DRAWN AEROBIC AND ANAEROBIC Blood Culture results may not be optimal due to an inadequate volume of blood received in culture bottles Performed at [facility] [address], [name], [place] [address]    Culture  Setup Time   Final    GRAM POSITIVE COCCI IN CLUSTERS IN BOTH AEROBIC AND ANAEROBIC BOTTLES CRITICAL RESULT CALLED TO, READ BACK BY AND VERIFIED WITH: PHARMD M.TECKER AT 6503 ON 09/09/2020 BY T.SAAD    Culture (A)  Final    AEROCOCCUS URINAE CULTURE REINCUBATED FOR BETTER GROWTH Performed at [facility], [place] [address], [place], [place] [address]    Report Status PENDING  Incomplete  Blood Culture ID Panel (Reflexed)      Status: None   Collection Time: 09/07/20 10:08 PM  Result Value Ref Range Status   Enterococcus faecalis NOT DETECTED NOT DETECTED Final   Enterococcus Faecium NOT DETECTED NOT DETECTED Final   Listeria monocytogenes NOT DETECTED NOT DETECTED Final   Staphylococcus species NOT DETECTED NOT DETECTED Final   Staphylococcus aureus (BCID) NOT DETECTED NOT DETECTED Final   Staphylococcus epidermidis NOT DETECTED NOT DETECTED Final   Staphylococcus lugdunensis NOT DETECTED NOT DETECTED Final   Streptococcus species NOT DETECTED NOT DETECTED Final   Streptococcus agalactiae NOT DETECTED NOT DETECTED Final   Streptococcus pneumoniae NOT DETECTED NOT DETECTED Final   Streptococcus pyogenes NOT DETECTED NOT DETECTED Final   A.calcoaceticus-baumannii NOT DETECTED NOT DETECTED Final   Bacteroides fragilis NOT DETECTED NOT DETECTED Final   Enterobacterales NOT DETECTED NOT DETECTED Final   Enterobacter cloacae complex NOT DETECTED NOT DETECTED Final   Escherichia coli NOT DETECTED NOT DETECTED Final   Klebsiella aerogenes NOT DETECTED NOT DETECTED Final   Klebsiella oxytoca NOT DETECTED NOT DETECTED Final   Klebsiella pneumoniae NOT DETECTED NOT DETECTED Final   Proteus species NOT DETECTED NOT DETECTED Final   Salmonella species NOT DETECTED NOT DETECTED Final   Serratia marcescens NOT DETECTED NOT DETECTED Final   Haemophilus influenzae NOT DETECTED NOT DETECTED Final   Neisseria meningitidis NOT DETECTED NOT DETECTED Final   Pseudomonas aeruginosa NOT DETECTED NOT DETECTED Final   Stenotrophomonas maltophilia NOT DETECTED NOT DETECTED Final   Candida albicans NOT DETECTED NOT DETECTED Final   Candida auris NOT DETECTED NOT DETECTED Final   Candida glabrata NOT DETECTED NOT DETECTED Final   Candida krusei NOT DETECTED NOT DETECTED Final   Candida parapsilosis NOT DETECTED NOT DETECTED Final   Candida tropicalis NOT DETECTED NOT DETECTED Final   Cryptococcus neoformans/gattii NOT DETECTED  NOT DETECTED Final    Comment: Performed at [facility], [address], [place], [place] [address]  Resp Panel by RT-PCR (Flu A&B, Covid) Nasopharyngeal Swab     Status: None   Collection Time: 09/07/20 10:10 PM   Specimen: Nasopharyngeal Swab; Nasopharyngeal(NP) swabs in vial transport medium  Result Value Ref Range Status   SARS Coronavirus 2 by RT PCR NEGATIVE NEGATIVE Final    Comment: (NOTE) SARS-CoV-2 target nucleic acids are NOT DETECTED.  The SARS-CoV-2 RNA is generally detectable in upper respiratory specimens during the acute phase of infection. The lowest concentration of SARS-CoV-2 viral copies this assay can detect is 138 copies/mL. A negative result does not preclude SARS-Cov-2 infection and  should not be used as the sole basis for treatment or other patient management decisions. A negative result may occur with  improper specimen collection/handling, submission of specimen other than nasopharyngeal swab, presence of viral mutation(s) within the areas targeted by this assay, and inadequate number of viral copies(<138 copies/mL). A negative result must be combined with clinical observations, patient history, and epidemiological information. The expected result is Negative.  Fact Sheet for Patients:  https://www.fda.gov/media/152166/download  Fact Sheet for Healthcare Providers:  https://www.fda.gov/media/152162/download  This test is no t yet approved or cleared by the [place] FDA and  has been authorized for detection and/or diagnosis of SARS-CoV-2 by FDA under an Emergency Use Authorization (EUA). This EUA will remain  in effect (meaning this test can be used) for the duration of the COVID-19 declaration under Section 564(b)(1) of the Act, 21 U.S.C.section 360bbb-3(b)(1), unless the authorization is terminated  or revoked sooner.       Influenza A by PCR NEGATIVE NEGATIVE Final   Influenza B by PCR NEGATIVE NEGATIVE Final    Comment: (NOTE) The  Xpert Xpress SARS-CoV-2/FLU/RSV plus assay is intended as an aid in the diagnosis of influenza from Nasopharyngeal swab specimens and should not be used as a sole basis for treatment. Nasal washings and aspirates are unacceptable for Xpert Xpress SARS-CoV-2/FLU/RSV testing.  Fact Sheet for Patients: https://www.fda.gov/media/152166/download  Fact Sheet for Healthcare Providers: https://www.fda.gov/media/152162/download  This test is not yet approved or cleared by the [place] FDA and has been authorized for detection and/or diagnosis of SARS-CoV-2 by FDA under an Emergency Use Authorization (EUA). This EUA will remain in effect (meaning this test can be used) for the duration of the COVID-19 declaration under Section 564(b)(1) of the Act, 21 U.S.C. section 360bbb-3(b)(1), unless the authorization is terminated or revoked.  Performed at [facility], [address] [place], [place] [address]   Urine Culture     Status: Abnormal   Collection Time: 09/07/20 10:40 PM   Specimen: In/Out Cath Urine  Result Value Ref Range Status   Specimen Description   Final    IN/OUT CATH URINE Performed at [facility], [address] [place], [place] [address]    Special Requests   Final    NONE Performed at [facility], [address] [place], [place] [address]    Culture MULTIPLE SPECIES PRESENT, SUGGEST RECOLLECTION (A)  Final   Report Status 09/09/2020 FINAL  Final  Culture, blood (routine x 2)     Status: None (Preliminary result)   Collection Time: 09/13/20 11:53 AM   Specimen: BLOOD  Result Value Ref Range Status   Specimen Description   Final    BLOOD LEFT ANTECUBITAL Performed at [facility], [address] [place], [place] [address]    Special Requests   Final    BOTTLES DRAWN AEROBIC ONLY Blood Culture adequate volume Performed at [facility], [address]  [place], [place] [address]    Culture   Final    NO GROWTH 2 DAYS Performed at [facility], [address] [place],  [address]    Report Status PENDING  Incomplete  Culture, blood (routine x 2)     Status: None (Preliminary result)   Collection Time: 09/13/20 11:53 AM   Specimen: BLOOD  Result Value Ref Range Status   Specimen Description   Final    BLOOD BLOOD RIGHT WRIST Performed at [facility], [address]   [place], [place] [address]    Special Requests   Final    BOTTLES DRAWN AEROBIC ONLY Blood Culture adequate volume Performed at [facility] [address], [place], [place] [address]    Culture   Final    NO GROWTH 2 DAYS Performed at [facility] [address], [place], [place] [address]    Report Status PENDING  Incomplete  Urine Culture     Status: None   Collection Time: 09/13/20 11:30 PM   Specimen: Urine, Catheterized  Result Value Ref Range Status   Specimen Description   Final    URINE, CATHETERIZED Performed at [facility] [address], [place], [place] [address]    Special Requests   Final    NONE Performed at [facility], [place] [address] [place], [place] [address]    Culture   Final    NO GROWTH Performed at [facility], [place] [address], [place], [place] [address]    Report Status 09/15/2020 FINAL  Final    Radiology Studies: ECHOCARDIOGRAM COMPLETE  Result Date: 09/14/2020    ECHOCARDIOGRAM REPORT   Patient Name:   Michelle [name] Date of Exam: 09/14/2020 Medical Rec #:  [id number]      Height:       61.0 in Accession #:    [id number]     Weight:       103.8 lb Date of Birth:  [birth year]       BSA:          1.430 m Patient Age:    57 years       BP:           127/61 mmHg Patient Gender: F              HR:           82 bpm. Exam Location:  Inpatient Procedure: Cardiac Doppler, Color Doppler and 2D Echo Indications:    Bacteremia R78.81  History:         Patient has prior history of Echocardiogram examinations, most                 recent 07/12/2014. Risk Factors:Hypertension. Past history of                 breast cancer. Severe sepsis due to Aerococcus urinae                 bacteremia.  Sonographer:    Darlina Sicilian RDCS Referring Phys: [id number] Charlesetta Ivory Dominiqua Cooner  Sonographer Comments: Technically limited study due to chest wall pain. IMPRESSIONS  1. Left ventricular ejection fraction, by estimation, is 60 to 65%. The left ventricle has normal function. The left ventricle has no regional wall motion abnormalities. There is mild left ventricular hypertrophy. Left ventricular diastolic parameters are indeterminate.  2. Right ventricule is poorly visualized but appears grossly normal size and systolic function. Tricuspid regurgitation signal is inadequate for assessing PA pressure.  3. The mitral valve is grossly normal. No evidence of mitral valve regurgitation. Mild mitral stenosis. MG 69mHg at HR 86bpm.  4. The aortic valve was not well visualized. Aortic valve regurgitation is trivial. Moderate aortic valve stenosis. Vmax 3.3 m/s, MG 26 mmHg, DI 0.38  5. The inferior vena cava is normal in size with greater than 50% respiratory variability, suggesting right atrial pressure of 3 mmHg. Conclusion(s)/Recommendation(s): No clear vegetation seen, but technically difficult study with limited views. If  clinical suspicion for endocarditis, recommend TEE. FINDINGS  Left Ventricle: Left ventricular ejection fraction, by estimation, is 60 to 65%. The left ventricle has normal function. The left ventricle has no regional wall motion abnormalities. The left ventricular internal cavity size was normal in size. There is  mild left ventricular hypertrophy. Left ventricular diastolic parameters are indeterminate. Right Ventricle: The right ventricular size is normal. Right vetricular wall thickness was not well visualized. Right ventricular systolic function is normal. Tricuspid  regurgitation signal is inadequate for assessing PA pressure. Left Atrium: Left atrial size was normal in size. Right Atrium: Right atrial size was not well visualized. Pericardium: There is no evidence of pericardial effusion. Mitral Valve: The mitral valve is grossly normal. No evidence of mitral valve regurgitation. Mild mitral valve stenosis. Tricuspid Valve: The tricuspid valve is not well visualized. Tricuspid valve regurgitation is not demonstrated. Aortic Valve: The aortic valve was not well visualized. Aortic valve regurgitation is trivial. Aortic regurgitation PHT measures 289 msec. Moderate aortic stenosis is present. Aortic valve mean gradient measures 16.7 mmHg. Aortic valve peak gradient measures 27.0 mmHg. Aortic valve area, by VTI measures 0.46 cm. Pulmonic Valve: The pulmonic valve was not well visualized. Pulmonic valve regurgitation is not visualized. Aorta: The aortic root is normal in size and structure. Venous: The inferior vena cava is normal in size with greater than 50% respiratory variability, suggesting right atrial pressure of 3 mmHg. IAS/Shunts: The interatrial septum was not well visualized.  LEFT VENTRICLE PLAX 2D LVOT diam:     1.10 cm  Diastology LV SV:         23       LV e' medial:    4.80 cm/s LV SV Index:   16       LV E/e' medial:  14.4 LVOT Area:     0.95 cm LV e' lateral:   5.11 cm/s                         LV E/e' lateral: 13.6  LEFT ATRIUM           Index LA Vol (A4C): 26.9 ml 18.82 ml/m  AORTIC VALVE AV Area (Vmax):    0.49 cm AV Area (Vmean):   0.45 cm AV Area (VTI):     0.46 cm AV Vmax:           260.00 cm/s AV Vmean:          177.333 cm/s AV VTI:            0.505 m AV Peak Grad:      27.0 mmHg AV Mean Grad:      16.7 mmHg LVOT Vmax:         133.00 cm/s LVOT Vmean:        83.800 cm/s LVOT VTI:          0.245 m LVOT/AV VTI ratio: 0.49 AI PHT:            289 msec  AORTA Ao Root diam: 2.10 cm MITRAL VALVE MV Area (PHT): 3.06 cm     SHUNTS MV Decel Time: 248 msec      Systemic VTI:  0.24 m MV E velocity: 69.30 cm/s   Systemic Diam: 1.10 cm MV A velocity: 112.00 cm/s MV E/A ratio:  0.62 Christopher Schumann MD Electronically signed by Christopher Schumann MD Signature Date/Time: 09/14/2020/3:46:05 PM    Final       Taye T[facility]    If 7PM-7AM, please contact night-coverage [email or web address] 09/15/2020, 1:50 PM

## 2020-09-15 NOTE — Progress Notes (Signed)
RN note: Pt appears to be very forgetful, irritable, and uncooperative. pt turned every hour and thirty to forty-five minutes, pt states no one is coming into her room. Yells out nurse very loud and other patients are having difficulty sleeping. Explained to patient she has been turned every hour and thirty to forty-five minutes and we are trying our best to keep her comfortable. Pt. States no one is turning her, reminded her of the care she is receiving. Pt continuously states she is wet, advised purewick is in placed, peri assessment completed several times and patient remains dry. Showed pt the urine in the canister and explained how the [place] works. Will continue to care for Michelle [name] by providing quality care. No distress noted, will continue to monitor pt. Closely. Neomia Dear RN

## 2020-09-16 ENCOUNTER — Telehealth: Payer: Self-pay | Admitting: Oncology

## 2020-09-16 ENCOUNTER — Telehealth: Payer: Self-pay | Admitting: *Deleted

## 2020-09-16 DIAGNOSIS — A419 Sepsis, unspecified organism: Secondary | ICD-10-CM | POA: Diagnosis not present

## 2020-09-16 DIAGNOSIS — N390 Urinary tract infection, site not specified: Secondary | ICD-10-CM | POA: Diagnosis not present

## 2020-09-16 LAB — CREATININE, SERUM
Creatinine, Ser: 0.64 mg/dL (ref 0.44–1.00)
GFR, Estimated: >60 mL/min (ref >60)

## 2020-09-16 LAB — PREPARE FRESH FROZEN PLASMA: Unit division: 0

## 2020-09-16 LAB — BPAM FFP
Blood Product Expiration Date: 202208022359
Blood Product Expiration Date: 202208022359
Unit Type and Rh: 5100
Unit Type and Rh: 9500

## 2020-09-16 MED ORDER — PREDNISONE 20 MG PO TABS: 40.0000 mg | ORAL_TABLET | Freq: Every day | ORAL | 0 refills | Status: DC

## 2020-09-16 MED ORDER — AMOXICILLIN 500 MG PO CAPS: 1000.0000 mg | ORAL_CAPSULE | Freq: Three times a day (TID) | ORAL | 0 refills | Status: AC

## 2020-09-16 MED ORDER — AMLODIPINE BESYLATE 5 MG PO TABS: 5.0000 mg | ORAL_TABLET | Freq: Every day | ORAL | 1 refills | Status: AC

## 2020-09-16 MED ORDER — MIRTAZAPINE 15 MG PO TABS: 15.0000 mg | ORAL_TABLET | Freq: Every day | ORAL | 1 refills | Status: AC

## 2020-09-16 MED ORDER — ENSURE ENLIVE PO LIQD: 237.0000 mL | Freq: Two times a day (BID) | ORAL | 0 refills | Status: AC

## 2020-09-16 MED ORDER — ADULT MULTIVITAMIN W/MINERALS CH: 1.0000 | ORAL_TABLET | Freq: Every day | ORAL | Status: AC

## 2020-09-16 MED ORDER — PREDNISONE 20 MG PO TABS: 40.0000 mg | ORAL_TABLET | Freq: Every day | ORAL | 0 refills | Status: AC

## 2020-09-16 NOTE — Telephone Encounter (Signed)
Scheduled appts per 8/3 sch msg. Spoke to pt's daughter who said she was unsure whether pt will be able to make these appts. She requested the nurse reach out to her to discuss further. I sent a msg to RN Val to reach out to her when she gets a chance.

## 2020-09-16 NOTE — Progress Notes (Signed)
HEMATOLOGY-ONCOLOGY PROGRESS NOTE  Michelle [name]'s platelet count continues to slowly improve. I generally see a more robust response to IVIG. She is tolerating prednisone 40 mg QAM well. She tells me she went walking yesterday with assistance and sat up some. She thinks she is ready to go home. No family inroom  No family in room  Oncology History  Malignant neoplasm of upper-outer quadrant of left breast in female, estrogen receptor positive ([place])  05/17/2019 Cancer Staging   Staging form: Breast, AJCC 8th Edition - Clinical stage from 05/17/2019: Stage IIIA (cT3, cN1, cM0, G2, ER+, PR-, HER2-) - Signed by Gardenia Phlegm, NP on 05/29/2019    05/27/2019 Initial Diagnosis   Malignant neoplasm of upper-outer quadrant of left breast in female, estrogen receptor positive ([place])    Malignant neoplasm of overlapping sites of right breast in female, estrogen receptor positive ([place])  05/17/2019 Cancer Staging   Staging form: Breast, AJCC 8th Edition - Clinical stage from 05/17/2019: Stage IIA (cT2, cN0, cM0, G1, ER+, PR-, HER2-) - Signed by Gardenia Phlegm, NP on 05/29/2019    05/28/2019 Initial Diagnosis   Malignant neoplasm of overlapping sites of right breast in female, estrogen receptor positive ([place])      Past Medical History:  Diagnosis Date   Arthritis    Cancer ([name])    Bilaterial Breast Cancer   Dyspnea    GERD (gastroesophageal reflux disease)    Hypertension    Stroke ([place])    "mini stroke" per patient   Past Surgical History:  Procedure Laterality Date   ABDOMINAL HYSTERECTOMY     APPENDECTOMY     SHOULDER SURGERY     Right   Family History  Adopted: Yes   SOCIAL HISTORY: (updated 05/2019)  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  Her children are Michelle [name], who lives in [place] and works for [name]) lives in [place] and runs his own business, and Michelle [name]")  who lives in [place] and works for [employer]  He is also training his children to box.  Son [name] died from hepatitis C at age 62.  The patient has 11 grandchildren and more great grandchildren and great great-grandchildren than she can count.  She attends a local [place]: Not in place.  At the 05/28/2019 visit the patient was given the appropriate documents to complete and notarized at her discretion.  PHYSICAL EXAMINATION: ECOG PERFORMANCE STATUS: 2 - Symptomatic, <50% confined to bed  Vitals:   09/15/20 2037 09/16/20 0536  BP: (!) 146/72 (!) 140/48  Pulse: 80 79  Resp: 18 20  Temp: 98.3 F (36.8 C) 99.5 F (37.5 C)  SpO2: 95% 93%   Filed Weights   09/07/20 2156 09/08/20 0840  Weight: 104 lb (47.2 kg) 103 lb 13.4 oz (47.1 kg)    Intake/Output from previous day: 08/02 0701 - 08/03 0700 In: 480 [P.O.:480] Out: 2800 [Urine:2800]    LABORATORY DATA:  I have reviewed the data as listed CMP Latest Ref Rng & Units 09/16/2020 09/15/2020 09/14/2020  Glucose 70 - 99 mg/dL - 93 128(H)  BUN 8 - 23 mg/dL - 20 16  Creatinine 0.44 - 1.00 mg/dL 0.64 0.79 0.74  Sodium 135 - 145 mmol/L - 137 136  Potassium 3.5 -  5.1 mmol/L - 3.8 4.3  Chloride 98 - 111 mmol/L - 103 101  CO2 22 - 32 mmol/L - 26 27  Calcium 8.9 - 10.3 mg/dL - 8.1(L) 8.4(L)  Total Protein 6.5 - 8.1 g/dL - - -  Total Bilirubin 0.3 - 1.2 mg/dL - - -  Alkaline Phos 38 - 126 U/L - - -  AST 15 - 41 U/L - - -  ALT 0 - 44 U/L - - -    Lab Results  Component Value Date   WBC 7.1 09/15/2020   HGB 9.7 (L) 09/15/2020   HCT 30.3 (L) 09/15/2020   MCV 95.0 09/15/2020   PLT 83 (L) 09/15/2020   NEUTROABS 4.4 09/09/2020    DG Chest Port 1 View  Result Date: 09/07/2020 CLINICAL DATA:  Questionable sepsis - evaluate for abnormality EXAM: PORTABLE CHEST 1 VIEW COMPARISON:  Radiograph 07/05/2012, chest CT 06/07/2019 FINDINGS: Unchanged cardiomediastinal silhouette. Aortic arch calcifications.  There is no focal airspace disease. There is no large pleural effusion or visible pneumothorax. There is no acute osseous abnormality. Dextroconvex curvature of the spine. IMPRESSION: No focal airspace consolidation. Electronically Signed   By: Maurine Simmering   On: 09/07/2020 22:01   ECHOCARDIOGRAM COMPLETE  Result Date: 09/14/2020    ECHOCARDIOGRAM REPORT   Patient Name:   Michelle [name] Date of Exam: 09/14/2020 Medical Rec #:  [id number]      Height:       61.0 in Accession #:    [id number]     Weight:       103.8 lb Date of Birth:  1930-12-14       BSA:          1.430 m Patient Age:    87 years       BP:           127/61 mmHg Patient Gender: F              HR:           82 bpm. Exam Location:  Inpatient Procedure: Cardiac Doppler, Color Doppler and 2D Echo Indications:    Bacteremia R78.81  History:        Patient has prior history of Echocardiogram examinations, most                 recent 07/12/2014. Risk Factors:Hypertension. Past history of                 breast cancer. Severe sepsis due to Aerococcus urinae                 bacteremia.  Sonographer:    Darlina Sicilian RDCS Referring Phys: [id number] Charlesetta Ivory GONFA  Sonographer Comments: Technically limited study due to chest wall pain. IMPRESSIONS  1. Left ventricular ejection fraction, by estimation, is 60 to 65%. The left ventricle has normal function. The left ventricle has no regional wall motion abnormalities. There is mild left ventricular hypertrophy. Left ventricular diastolic parameters are indeterminate.  2. Right ventricule is poorly visualized but appears grossly normal size and systolic function. Tricuspid regurgitation signal is inadequate for assessing PA pressure.  3. The mitral valve is grossly normal. No evidence of mitral valve regurgitation. Mild mitral stenosis. MG 32mHg at HR 86bpm.  4. The aortic valve was not well visualized. Aortic valve regurgitation is trivial. Moderate aortic valve stenosis. Vmax 3.3 m/s, MG 26 mmHg, DI 0.38  5. The inferior  vena cava is normal in size with greater than 50%  respiratory variability, suggesting right atrial pressure of 3 mmHg. Conclusion(s)/Recommendation(s): No clear vegetation seen, but technically difficult study with limited views. If clinical suspicion for endocarditis, recommend TEE. FINDINGS  Left Ventricle: Left ventricular ejection fraction, by estimation, is 60 to 65%. The left ventricle has normal function. The left ventricle has no regional wall motion abnormalities. The left ventricular internal cavity size was normal in size. There is  mild left ventricular hypertrophy. Left ventricular diastolic parameters are indeterminate. Right Ventricle: The right ventricular size is normal. Right vetricular wall thickness was not well visualized. Right ventricular systolic function is normal. Tricuspid regurgitation signal is inadequate for assessing PA pressure. Left Atrium: Left atrial size was normal in size. Right Atrium: Right atrial size was not well visualized. Pericardium: There is no evidence of pericardial effusion. Mitral Valve: The mitral valve is grossly normal. No evidence of mitral valve regurgitation. Mild mitral valve stenosis. Tricuspid Valve: The tricuspid valve is not well visualized. Tricuspid valve regurgitation is not demonstrated. Aortic Valve: The aortic valve was not well visualized. Aortic valve regurgitation is trivial. Aortic regurgitation PHT measures 289 msec. Moderate aortic stenosis is present. Aortic valve mean gradient measures 16.7 mmHg. Aortic valve peak gradient measures 27.0 mmHg. Aortic valve area, by VTI measures 0.46 cm. Pulmonic Valve: The pulmonic valve was not well visualized. Pulmonic valve regurgitation is not visualized. Aorta: The aortic root is normal in size and structure. Venous: The inferior vena cava is normal in size with greater than 50% respiratory variability, suggesting right atrial pressure of 3 mmHg. IAS/Shunts: The interatrial septum was not well  visualized.  LEFT VENTRICLE PLAX 2D LVOT diam:     1.10 cm  Diastology LV SV:         23       LV e' medial:    4.80 cm/s LV SV Index:   16       LV E/e' medial:  14.4 LVOT Area:     0.95 cm LV e' lateral:   5.11 cm/s                         LV E/e' lateral: 13.6  LEFT ATRIUM           Index LA Vol (A4C): 26.9 ml 18.82 ml/m  AORTIC VALVE AV Area (Vmax):    0.49 cm AV Area (Vmean):   0.45 cm AV Area (VTI):     0.46 cm AV Vmax:           260.00 cm/s AV Vmean:          177.333 cm/s AV VTI:            0.505 m AV Peak Grad:      27.0 mmHg AV Mean Grad:      16.7 mmHg LVOT Vmax:         133.00 cm/s LVOT Vmean:        83.800 cm/s LVOT VTI:          0.245 m LVOT/AV VTI ratio: 0.49 AI PHT:            289 msec  AORTA Ao Root diam: 2.10 cm MITRAL VALVE MV Area (PHT): 3.06 cm     SHUNTS MV Decel Time: 248 msec     Systemic VTI:  0.24 m MV E velocity: 69.30 cm/s   Systemic Diam: 1.10 cm MV A velocity: 112.00 cm/s MV E/A ratio:  0.62 Oswaldo Milian MD Electronically signed by Oswaldo Milian  MD Signature Date/Time: 09/14/2020/3:46:05 PM    Final     ASSESSMENT:  A: Breast Cancer history:  87 y.o. [place] woman status post bilateral breast biopsies on 05/17/2019 as follows:             (a) on the left upper outer quadrant, a clinical T2 N2, stage IIIA invasive ductal carcinoma,grade 2 or 3, estrogen receptor positive, progesterone receptor negative, with no HER-2 amplification and an MIB-1 of 12%             (b) on the right overlapping sites, 2 morphologically distinct invasive ductal carcinomas, both clinically T1c N0, stage IA, grade 1 or 2, estrogen receptor positive, progesterone receptor negative, with no HER-2 amplification and an MIB-1 of 5%             (c) CT of the chest abdomen and pelvis 06/10/2019 shows left axillary, retropectoral and supraclavicular lymph nodes, and a 4.1 cm left breast mass; no right axillary adenopathy; 0.6 cm nonspecific left lower lobe pulmonary nodule; no liver or bone  lesions.             (d) bone scan 06/26/2019--no evidence of skeletal metastases                          (1) neoadjuvant anastrozole started 05/28/2019, palbociclib added 05/30/2019 125 mg a day, 21 on 7 off             (a) palbociclib dose decreased cycle 2 (May 2021) to 100 mg daily, 21/7             (b) bilateral breast and axilla ultrasonography 10/04/2019 shows a good initial response to treatment             (c) repeat bilateral ultrasonography 05/20/2020 continues to show evidence of response   (2) genetics testing declined   (3) definitive surgery to follow (patient refuses for now)   (4) adjuvant radiation to follow surgery  (5) immune thrombocytopenia (ITP)  (A) ADAMTS 13 was 63.0 on 09/09/2020  (B) status post IVIG 09/10/2020 and 09/11/2020  (C) prednisone 40 mg daily started 09/12/2018  PLAN: Glynn's platelets are >50K and rising. From my point of view she may be discharged at hospitalist's/family's discretion.  Please make sure patient is on prednisone 40 mg po Q AM with breakfast and anstrozole 1 mg po daily at discharge. We will resume her palbociclib after she sees me in the clinic which will be 8/11 (not 8/4 as original;l;y planned)  I greatly appreciate hospitalist's help to this patient and her family!  Will sign off at this time    LOS: 9 days   Chauncey Cruel, DNP, AGPCNP-BC, AOCNP 09/16/20   Chauncey Cruel, MD Medical Oncology and Hematology [facility] [address] [place], [place] [address] Tel. [phone]    Fax. [phone]

## 2020-09-16 NOTE — Telephone Encounter (Addendum)
This RN contacted pt's daughterAce [name] per appointment follow up and concerns - Michelle [name] stated "mom is 87 years old and frail - I don't understand why the cancer MDs haven't seen her while she is in the hospital - she is right there next to you?  This RN explained that Dr [name] has been seeing her mother while she has been in the hospital- and due to known cancer and now new blood issue - he needs to see her regarding medication use and how we need to adjust it.  Michelle [name] stated concerns regarding " transportation and getting her there "-  " she is not walking good"  This RN informed her above needs to be discussed with the nurse case manager- per need for hospitalist MD to order appropriate home care.  Michelle [name] states her brother - [name]- " is in charge of all that and he is speaking with them "  " I will be there when she is discharged home"  This RN advised Michelle [name] to call this office on Monday 8/8 if there is an issue/concern with getting her mother to this office.  Michelle [name] wrote down appt date and time- and will discuss above with her brother and call if needed. ----- Message from Karie Mainland sent at 09/16/2020  9:48 AM EDT ----- Scheduling Message Entered by Chauncey Cruel on 09/16/2020 at  8:08 AM Priority: High <No visit type provided> Department: [facility] Provider:  Appointment Notes: cancel lab visit and injection 8/4; see [place] 8/11 at 3 PM, 30 minutes, labs and injection same day Scheduling Notes:  Hi Val,  I just spoke to Ms. Viverette's daughter, Michelle [name], and she had requested a call from you to discuss her mother's appointments. She said she's not sure if her mother will be able to make it in person to any appointments and wanted to discuss it further with you. Could you reach out to her when you get a chance? Her number is: [phone].  Thanks, [employer]

## 2020-09-16 NOTE — TOC Transition Note (Signed)
Transition of Care [facility]) - CM/SW Discharge Note   Patient Details  Name: SHANETRA AMATUCCI MRN: [id number] Date of Birth: 1930-03-04  Transition of Care [facility]) CM/SW Contact:  Lynnell Catalan, RN Phone Number: 09/16/2020, 3:05 PM   Clinical Narrative:     Pt to dc home today with family. Ambulance transport set up to take pt to [place]. RW requested by family. [facility] contacted for DME. [facility] to follow pt for home health services at dc.     Barriers to Discharge: Continued Medical Work up   Patient Goals and CMS Choice Patient states their goals for this hospitalization and ongoing recovery are:: To go home     Discharge Plan and Services   Discharge Planning Services: CM Consult            DME Arranged: 3-N-1 DME Agency: [facility] Date DME Agency Contacted: 09/10/20 Time DME Agency Contacted: 1528   [place] Arranged: RN, PT, Nurse's Aide, OT HH Agency: [place] Date [facility] Agency Contacted: 09/10/20 Time [place]: 1528 Representative spoke with at [place]: [place] ([place]) Interventions     Readmission Risk Interventions No flowsheet data found.

## 2020-09-17 ENCOUNTER — Ambulatory Visit: Payer: Medicare HMO | Admitting: Adult Health

## 2020-09-17 ENCOUNTER — Ambulatory Visit: Payer: Medicare HMO

## 2020-09-17 ENCOUNTER — Other Ambulatory Visit: Payer: Medicare HMO

## 2020-09-17 DIAGNOSIS — E876 Hypokalemia: Secondary | ICD-10-CM

## 2020-09-17 DIAGNOSIS — Z853 Personal history of malignant neoplasm of breast: Secondary | ICD-10-CM

## 2020-09-17 DIAGNOSIS — E43 Unspecified severe protein-calorie malnutrition: Secondary | ICD-10-CM

## 2020-09-17 NOTE — Progress Notes (Signed)
Patient discharged home and left via [facility], escorted by [facility] staff. IV removed, patient placed on stretcher and tolerated transfer well. Called daughter to inform her that she is on the way. Discharge papers given to patient and [facility] staff.

## 2020-09-17 NOTE — Discharge Summary (Signed)
Physician Discharge Summary  Michelle [name]:[id number] DOB: 1930/10/01 DOA: 09/07/2020  PCP: Michelle Battles, MD  Admit date: 09/07/2020 Discharge date: 09/17/2020  Admitted From: Home Disposition: Home  Recommendations for Outpatient Follow-up:  Follow ups as below. Please obtain CBC/BMP/Mag at follow up Please follow up on the following pending results: None  Home Health: PT/OT/RN Equipment/Devices: None  Discharge Condition: stable but guarded prognosis CODE STATUS: Full code   Follow-up Information     Care, [facility] Follow up.   Specialty: Home Health Services Contact information: [place] [address] [phone]         Michelle Battles, MD. Schedule an appointment as soon as possible for a visit in 1 week(s).   Specialty: Internal Medicine Why: If symptoms worsen, As needed Contact information: [name] [address] [phone]                  Hospital Course: 87 year old F with PMH of breast cancer on Ibrance and HTN presenting with dysuria, myalgia, chest wall pain, cough, nausea and dysuria, and admitted for sepsis due to bacteremia and UTI and acute ITP.  Blood and urine culture growing Aerococcus urinae.  Initially started on IV ceftriaxone and transitioned to IV ampicillin.  She also received blood transfusions, FFP' and IVIG.  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  Oncology recommended continuing prednisone on discharge until outpatient follow-up.  Patient may need PJP prophylaxis if she needs to continue significant dose of prednisone for more than 20 days.  ID consulted on 7/31 and recommended IV Vanco and ampicillin, repeat blood culture and TTE.  Repeat blood and urine culture NGTD.  Vancomycin discontinued.  TTE without vegetation.  Received IV ampicillin from 7/31-8/3, and discharged on p.o. amoxicillin for 13 more days per ID recommendation.    Palliative medicine met with patient and  patient's family and recommended outpatient palliative follow-up.  Therapy recommended SNF but patient and family declined SNF and discharged home with home health.  Discharge Diagnoses:  Severe sepsis due to Aerococcus urinae bacteremia and UTI in immunocompromised patient: POA. Met criteria on presentation with SIRS, hyperbilirubinemia and thrombocytopenia.  Clinically improving although very weak.  TTE without vegetation.  Repeat blood culture urine culture NGTD. -Appreciate ID recs. -IV ceftriaxone>>> IV ampicillin 7/31>> p.o. Maxilene for 13 more days per ID recommendation -IV vancomycin 7/31-8/1    Dysuria-did not improve with antibiotics and Pyridium but with local topical cream concerning for vaginal dryness.  Patient to continue topical cream.  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  Recent Labs  Lab 09/11/20 0654 09/12/20 0658 09/13/20 0734 09/14/20 0510 09/15/20 0505  PLT 38* 43* 52* 74* 83*  -Received 4 units of FFP, IVIG on 7/28 and 7/30.  -Discharged on p.o. prednisone 40 mg daily until follow-up with oncology -Cleared for discharge by oncology.    Hx breast cancer: Followed by Dr. Jana [name] outpatient.  On Ibrance and Arimidex -Continue Arimidex per oncology recommendation -Discontinued Ibrance in the setting of sepsis/infection.   Anemia of chronic disease: H&H stable after 3 units. Recent Labs (within last 365 days)              Recent Labs    09/07/20 2208 09/08/20 0339 09/09/20 0604 09/09/20 1619 09/10/20 0600 09/11/20 0654 09/12/20 0658 09/13/20 0734 09/14/20 0510 09/15/20 0505  HGB 9.2* 8.4* 7.3* 7.5* 10.1* 10.1* 10.0* 9.9* 11.7* 9.7*    -Recheck CBC at follow-up.   Essential  hypertension: BP slightly elevated. -Discontinued home losartan and HCTZ and discharged on low-dose amlodipine due to high risk for dehydration and AKI from poor p.o. intake.   Hyponatremia/hypokalemia: Resolved. -Discontinue losartan and HCTZ.    Osteoarthritis -Tylenol   Acute metabolic encephalopathy: Resolved.  Seems to be at baseline. -Discontinued tramadol and continue Tylenol -Reorientation and delirium precautions.   HLD: On pravastatin outpatient, will hold for now.  Not sure if indicated   Weakness/fatigue/deconditioning: -PT/OT recommending SNF but she prefers to go home when medically ready. Family in agreement.   Goal of care counseling: patient with comorbidities as above.  Poor long-term prognosis.  She is very frail.  Still full code although heroic measures would pose more harm than benefits in her situation.  Palliative care met with patient and patient's family, I recommended outpatient follow-up. -Palliative care consulted for assistance with goals of care discussion   Severe protein calorie malnutrition/decreased p.o. intake: POA home -Started Remeron 15 mg nightly -Ensure and multivitamin Body mass index is 19.62 kg/m. Nutrition Problem: Increased nutrient needs Etiology: acute illness Signs/Symptoms: estimated needs Interventions: Ensure Enlive (each supplement provides 350kcal and 20 grams of protein), MVI      Discharge Exam: Vitals:   09/16/20 0536 09/16/20 2146  BP: (!) 140/48 133/64  Pulse: 79 85  Resp: 20 18  Temp: 99.5 F (37.5 C) 98 F (36.7 C)  SpO2: 93% 99%    GENERAL: Frail looking elderly female.  Nontoxic. HEENT: MMM.  Vision and hearing grossly intact.  NECK: Supple.  No apparent JVD.  RESP: On RA.  No IWOB.  Fair aeration bilaterally. CVS:  RRR.  2/6 SEM over RUSB.  ABD/GI/GU: Bowel sounds present. Soft. Non tender.  MSK/EXT:  Moves extremities. No apparent deformity. No edema.  SKIN: no apparent skin lesion or wound NEURO: Awake, alert and oriented to self, place, months and year.  No apparent focal neuro deficit. PSYCH: Calm. Normal affect.   Discharge Instructions  Discharge Instructions     Informed Consent Details: Physician/Practitioner Attestation; Transcribe  to consent form and obtain patient signature   Complete by: Sep 09, 2020    Physician/Practitioner attestation of informed consent for blood and or blood product transfusion: I, the physician/practitioner, attest that I have discussed with the patient the benefits, risks, side effects, alternatives, likelihood of achieving goals and potential problems during recovery for the procedure that I have provided informed consent.   Product(s): All Product(s)   Call MD for:  extreme fatigue   Complete by: As directed    Call MD for:  persistant dizziness or light-headedness   Complete by: As directed    Call MD for:  persistant nausea and vomiting   Complete by: As directed    Call MD for:  severe uncontrolled pain   Complete by: As directed    Call MD for:  temperature >100.4   Complete by: As directed    Care order/instruction   Complete by: As directed    Transfuse Parameters   Care order/instruction   Complete by: As directed    Transfuse Parameters   Diet general   Complete by: As directed    Discharge instructions   Complete by: As directed    It has been a pleasure taking care of you!  You were hospitalized due to blood and urinary tract infection, and low platelet levels.  You have been treated with antibiotics for infection.  We are discharging you on more antibiotics to complete treatment course.  In regards to your low platelet, we are discharging you on prednisone to continue taking until you follow-up with your oncologist. We have changed your blood pressure medication due to your low sodium and potassium level.  Please review your new medication list and the directions on your medications before you take them.  Go to your appointment with oncologist as previously planned   Take care,   Increase activity slowly   Complete by: As directed    Type and screen   Complete by: As directed    Raymond    Type and screen   Complete by: As directed     [place]       Allergies as of 09/17/2020   No Known Allergies      Medication List     STOP taking these medications    Ibrance 100 MG tablet Generic drug: palbociclib   losartan-hydrochlorothiazide 100-12.5 MG tablet Commonly known as: HYZAAR   pravastatin 20 MG tablet Commonly known as: PRAVACHOL       TAKE these medications    acetaminophen 500 MG tablet Commonly known as: TYLENOL Take 500-1,000 mg by mouth every 6 (six) hours as needed for mild pain or headache.   amLODipine 5 MG tablet Commonly known as: NORVASC Take 1 tablet (5 mg total) by mouth daily.   amoxicillin 500 MG capsule Commonly known as: AMOXIL Take 2 capsules (1,000 mg total) by mouth every 8 (eight) hours for 38 doses.   anastrozole 1 MG tablet Commonly known as: ARIMIDEX Take 1 tablet (1 mg total) by mouth daily.   Calcium 600+D3 600-800 MG-UNIT Tabs Generic drug: Calcium Carb-Cholecalciferol Take 2 tablets by mouth in the morning.   diphenhydrAMINE 12.5 MG/5ML liquid Commonly known as: BENADRYL Take 12.5-25 mg by mouth daily as needed for allergies.   esomeprazole 20 MG capsule Commonly known as: NEXIUM Take 20 mg by mouth daily as needed (acid reflux).   feeding supplement Liqd Take 237 mLs by mouth 2 (two) times daily between meals.   gabapentin 100 MG capsule Commonly known as: NEURONTIN Take 100 mg by mouth 3 (three) times daily.   mirtazapine 15 MG tablet Commonly known as: REMERON Take 1 tablet (15 mg total) by mouth at bedtime.   multivitamin with minerals Tabs tablet Take 1 tablet by mouth daily.   predniSONE 20 MG tablet Commonly known as: DELTASONE Take 2 tablets (40 mg total) by mouth daily with breakfast for 10 days.   triamcinolone ointment 0.5 % Commonly known as: KENALOG Apply 1 application topically 2 (two) times daily.        Consultations: Oncology Palliative medicine  Procedures/Studies   DG Chest Port 1  View  Result Date: 09/07/2020 CLINICAL DATA:  Questionable sepsis - evaluate for abnormality EXAM: PORTABLE CHEST 1 VIEW COMPARISON:  Radiograph 07/05/2012, chest CT 06/07/2019 FINDINGS: Unchanged cardiomediastinal silhouette. Aortic arch calcifications. There is no focal airspace disease. There is no large pleural effusion or visible pneumothorax. There is no acute osseous abnormality. Dextroconvex curvature of the spine. IMPRESSION: No focal airspace consolidation. Electronically Signed   By: Maurine Simmering   On: 09/07/2020 22:01   ECHOCARDIOGRAM COMPLETE  Result Date: 09/14/2020    ECHOCARDIOGRAM REPORT   Patient Name:   Michelle [name] Date of Exam: 09/14/2020 Medical Rec #:  [id number]      Height:       61.0 in Accession #:    [id number]     Weight:  103.8 lb Date of Birth:  10-19-1930       BSA:          1.430 m Patient Age:    87 years       BP:           127/61 mmHg Patient Gender: F              HR:           82 bpm. Exam Location:  Inpatient Procedure: Cardiac Doppler, Color Doppler and 2D Echo Indications:    Bacteremia R78.81  History:        Patient has prior history of Echocardiogram examinations, most                 recent 07/12/2014. Risk Factors:Hypertension. Past history of                 breast cancer. Severe sepsis due to Aerococcus urinae                 bacteremia.  Sonographer:    Darlina Sicilian RDCS Referring Phys: [id number] Charlesetta Ivory Lolamae Voisin  Sonographer Comments: Technically limited study due to chest wall pain. IMPRESSIONS  1. Left ventricular ejection fraction, by estimation, is 60 to 65%. The left ventricle has normal function. The left ventricle has no regional wall motion abnormalities. There is mild left ventricular hypertrophy. Left ventricular diastolic parameters are indeterminate.  2. Right ventricule is poorly visualized but appears grossly normal size and systolic function. Tricuspid regurgitation signal is inadequate for assessing PA pressure.  3. The mitral valve is grossly  normal. No evidence of mitral valve regurgitation. Mild mitral stenosis. MG 53mHg at HR 86bpm.  4. The aortic valve was not well visualized. Aortic valve regurgitation is trivial. Moderate aortic valve stenosis. Vmax 3.3 m/s, MG 26 mmHg, DI 0.38  5. The inferior vena cava is normal in size with greater than 50% respiratory variability, suggesting right atrial pressure of 3 mmHg. Conclusion(s)/Recommendation(s): No clear vegetation seen, but technically difficult study with limited views. If clinical suspicion for endocarditis, recommend TEE. FINDINGS  Left Ventricle: Left ventricular ejection fraction, by estimation, is 60 to 65%. The left ventricle has normal function. The left ventricle has no regional wall motion abnormalities. The left ventricular internal cavity size was normal in size. There is  mild left ventricular hypertrophy. Left ventricular diastolic parameters are indeterminate. Right Ventricle: The right ventricular size is normal. Right vetricular wall thickness was not well visualized. Right ventricular systolic function is normal. Tricuspid regurgitation signal is inadequate for assessing PA pressure. Left Atrium: Left atrial size was normal in size. Right Atrium: Right atrial size was not well visualized. Pericardium: There is no evidence of pericardial effusion. Mitral Valve: The mitral valve is grossly normal. No evidence of mitral valve regurgitation. Mild mitral valve stenosis. Tricuspid Valve: The tricuspid valve is not well visualized. Tricuspid valve regurgitation is not demonstrated. Aortic Valve: The aortic valve was not well visualized. Aortic valve regurgitation is trivial. Aortic regurgitation PHT measures 289 msec. Moderate aortic stenosis is present. Aortic valve mean gradient measures 16.7 mmHg. Aortic valve peak gradient measures 27.0 mmHg. Aortic valve area, by VTI measures 0.46 cm. Pulmonic Valve: The pulmonic valve was not well visualized. Pulmonic valve regurgitation is not  visualized. Aorta: The aortic root is normal in size and structure. Venous: The inferior vena cava is normal in size with greater than 50% respiratory variability, suggesting right atrial pressure of 3 mmHg. IAS/Shunts: The interatrial septum  was not well visualized.  LEFT VENTRICLE PLAX 2D LVOT diam:     1.10 cm  Diastology LV SV:         23       LV e' medial:    4.80 cm/s LV SV Index:   16       LV E/e' medial:  14.4 LVOT Area:     0.95 cm LV e' lateral:   5.11 cm/s                         LV E/e' lateral: 13.6  LEFT ATRIUM           Index LA Vol (A4C): 26.9 ml 18.82 ml/m  AORTIC VALVE AV Area (Vmax):    0.49 cm AV Area (Vmean):   0.45 cm AV Area (VTI):     0.46 cm AV Vmax:           260.00 cm/s AV Vmean:          177.333 cm/s AV VTI:            0.505 m AV Peak Grad:      27.0 mmHg AV Mean Grad:      16.7 mmHg LVOT Vmax:         133.00 cm/s LVOT Vmean:        83.800 cm/s LVOT VTI:          0.245 m LVOT/AV VTI ratio: 0.49 AI PHT:            289 msec  AORTA Ao Root diam: 2.10 cm MITRAL VALVE MV Area (PHT): 3.06 cm     SHUNTS MV Decel Time: 248 msec     Systemic VTI:  0.24 m MV E velocity: 69.30 cm/s   Systemic Diam: 1.10 cm MV A velocity: 112.00 cm/s MV E/A ratio:  0.62 Oswaldo Milian MD Electronically signed by Oswaldo Milian MD Signature Date/Time: 09/14/2020/3:46:05 PM    Final        The results of significant diagnostics from this hospitalization (including imaging, microbiology, ancillary and laboratory) are listed below for reference.     Microbiology: Recent Results (from the past 240 hour(s))  Blood Culture (routine x 2)     Status: Abnormal   Collection Time: 09/07/20 10:08 PM   Specimen: BLOOD LEFT FOREARM  Result Value Ref Range Status   Specimen Description   Final    BLOOD LEFT FOREARM Performed at [facility], [address] [place], [place] [address]    Special Requests   Final    BOTTLES DRAWN AEROBIC AND ANAEROBIC Blood Culture results may not be optimal  due to an inadequate volume of blood received in culture bottles Performed at [facility] [address], [place], [place] [address]    Culture  Setup Time   Final    GRAM POSITIVE COCCI AEROBIC BOTTLE ONLY CRITICAL VALUE NOTED.  VALUE IS CONSISTENT WITH PREVIOUSLY REPORTED AND CALLED VALUE.    Culture (A)  Final    AEROCOCCUS SPECIES Standardized susceptibility testing for this organism is not available. Performed at [facility], [place] [address], [place], [place] [address]    Report Status 09/12/2020 FINAL  Final  Blood Culture (routine x 2)     Status: Abnormal (Preliminary result)   Collection Time: 09/07/20 10:08 PM   Specimen: BLOOD LEFT WRIST  Result Value Ref Range Status   Specimen Description   Final  BLOOD LEFT WRIST Performed at [facility], [place] [address], [place], [place] [address]    Special Requests   Final    BOTTLES DRAWN AEROBIC AND ANAEROBIC Blood Culture results may not be optimal due to an inadequate volume of blood received in culture bottles Performed at [facility] [address] [place], [place] [address]    Culture  Setup Time   Final    GRAM POSITIVE COCCI IN CLUSTERS IN BOTH AEROBIC AND ANAEROBIC BOTTLES CRITICAL RESULT CALLED TO, READ BACK BY AND VERIFIED WITH: PHARMD M.TECKER AT [facility] ON 09/09/2020 BY T.SAAD    Culture (A)  Final    AEROCOCCUS URINAE Sent to [facility] for further susceptibility testing. Performed at [facility], [place] [address], [place], [place] [address]    Report Status PENDING  Incomplete  Blood Culture ID Panel (Reflexed)     Status: None   Collection Time: 09/07/20 10:08 PM  Result Value Ref Range Status   Enterococcus faecalis NOT DETECTED NOT DETECTED Final   Enterococcus Faecium NOT DETECTED NOT DETECTED Final   Listeria monocytogenes NOT DETECTED NOT DETECTED Final   Staphylococcus species NOT DETECTED NOT DETECTED Final   Staphylococcus aureus (BCID) NOT  DETECTED NOT DETECTED Final   Staphylococcus epidermidis NOT DETECTED NOT DETECTED Final   Staphylococcus lugdunensis NOT DETECTED NOT DETECTED Final   Streptococcus species NOT DETECTED NOT DETECTED Final   Streptococcus agalactiae NOT DETECTED NOT DETECTED Final   Streptococcus pneumoniae NOT DETECTED NOT DETECTED Final   Streptococcus pyogenes NOT DETECTED NOT DETECTED Final   A.calcoaceticus-baumannii NOT DETECTED NOT DETECTED Final   Bacteroides fragilis NOT DETECTED NOT DETECTED Final   Enterobacterales NOT DETECTED NOT DETECTED Final   Enterobacter cloacae complex NOT DETECTED NOT DETECTED Final   Escherichia coli NOT DETECTED NOT DETECTED Final   Klebsiella aerogenes NOT DETECTED NOT DETECTED Final   Klebsiella oxytoca NOT DETECTED NOT DETECTED Final   Klebsiella pneumoniae NOT DETECTED NOT DETECTED Final   Proteus species NOT DETECTED NOT DETECTED Final   Salmonella species NOT DETECTED NOT DETECTED Final   Serratia marcescens NOT DETECTED NOT DETECTED Final   Haemophilus influenzae NOT DETECTED NOT DETECTED Final   Neisseria meningitidis NOT DETECTED NOT DETECTED Final   Pseudomonas aeruginosa NOT DETECTED NOT DETECTED Final   Stenotrophomonas maltophilia NOT DETECTED NOT DETECTED Final   Candida albicans NOT DETECTED NOT DETECTED Final   Candida auris NOT DETECTED NOT DETECTED Final   Candida glabrata NOT DETECTED NOT DETECTED Final   Candida krusei NOT DETECTED NOT DETECTED Final   Candida parapsilosis NOT DETECTED NOT DETECTED Final   Candida tropicalis NOT DETECTED NOT DETECTED Final   Cryptococcus neoformans/gattii NOT DETECTED NOT DETECTED Final    Comment: Performed at [facility], [address] [place], [place] [address]  Susceptibility, Aer + Anaerob     Status: Abnormal   Collection Time: 09/07/20 10:08 PM  Result Value Ref Range Status   Suscept, Aer + Anaerob Preliminary report (A)  Final    Comment: (NOTE) Performed At: [facility] [place], [place] [id number] Rush Farmer MD VX:[id number]    Source of Sample   Final    [phone] AERO.URINAE BLOOD PENICLLIN VANCOMYCIN GENTAMICIN TOBRAMYCIN MEROPENEM CEFTIAXONE     Comment: Performed at [facility], [place] [address] [place], [place] [address]  Susceptibility Result     Status: Abnormal   Collection Time: 09/07/20 10:08 PM  Result Value Ref Range  Status   Suscept Result 1 Aerococcus urinae (A)  Final    Comment: (NOTE) Identification performed by account, not confirmed by this laboratory. Performed At: [facility] [place], [place] [id number] Rush Farmer MD TZ:[id number]   Resp Panel by RT-PCR (Flu A&B, Covid) Nasopharyngeal Swab     Status: None   Collection Time: 09/07/20 10:10 PM   Specimen: Nasopharyngeal Swab; Nasopharyngeal(NP) swabs in vial transport medium  Result Value Ref Range Status   SARS Coronavirus 2 by RT PCR NEGATIVE NEGATIVE Final    Comment: (NOTE) SARS-CoV-2 target nucleic acids are NOT DETECTED.  The SARS-CoV-2 RNA is generally detectable in upper respiratory specimens during the acute phase of infection. The lowest concentration of SARS-CoV-2 viral copies this assay can detect is 138 copies/mL. A negative result does not preclude SARS-Cov-2 infection and should not be used as the sole basis for treatment or other patient management decisions. A negative result may occur with  improper specimen collection/handling, submission of specimen other than nasopharyngeal swab, presence of viral mutation(s) within the areas targeted by this assay, and inadequate number of viral copies(<138 copies/mL). A negative result must be combined with clinical observations, patient history, and epidemiological information. The expected result is Negative.  Fact Sheet for Patients:  EntrepreneurPulse.com.au  Fact Sheet for Healthcare Providers:  IncredibleEmployment.be  This test  is no t yet approved or cleared by the [place] FDA and  has been authorized for detection and/or diagnosis of SARS-CoV-2 by FDA under an Emergency Use Authorization (EUA). This EUA will remain  in effect (meaning this test can be used) for the duration of the COVID-19 declaration under Section 564(b)(1) of the Act, 21 U.S.C.section 360bbb-3(b)(1), unless the authorization is terminated  or revoked sooner.       Influenza A by PCR NEGATIVE NEGATIVE Final   Influenza B by PCR NEGATIVE NEGATIVE Final    Comment: (NOTE) The Xpert Xpress SARS-CoV-2/FLU/RSV plus assay is intended as an aid in the diagnosis of influenza from Nasopharyngeal swab specimens and should not be used as a sole basis for treatment. Nasal washings and aspirates are unacceptable for Xpert Xpress SARS-CoV-2/FLU/RSV testing.  Fact Sheet for Patients: EntrepreneurPulse.com.au  Fact Sheet for Healthcare Providers: IncredibleEmployment.be  This test is not yet approved or cleared by the [place] FDA and has been authorized for detection and/or diagnosis of SARS-CoV-2 by FDA under an Emergency Use Authorization (EUA). This EUA will remain in effect (meaning this test can be used) for the duration of the COVID-19 declaration under Section 564(b)(1) of the Act, 21 U.S.C. section 360bbb-3(b)(1), unless the authorization is terminated or revoked.  Performed at [facility], [place] [address], [place], [place] [address]   Urine Culture     Status: Abnormal   Collection Time: 09/07/20 10:40 PM   Specimen: In/Out Cath Urine  Result Value Ref Range Status   Specimen Description   Final    IN/OUT CATH URINE Performed at [facility] [address] [place], [place] [address]    Special Requests   Final    NONE Performed at [facility], [place] [address], [place], [place] [address]    Culture MULTIPLE SPECIES PRESENT,  SUGGEST RECOLLECTION (A)  Final   Report Status 09/09/2020 FINAL  Final  Culture, blood (routine x 2)     Status: None (Preliminary result)   Collection Time: 09/13/20 11:53 AM   Specimen: BLOOD  Result Value Ref Range Status   Specimen  Description   Final    BLOOD LEFT ANTECUBITAL Performed at [facility] [address], [place], [place] [address]    Special Requests   Final    BOTTLES DRAWN AEROBIC ONLY Blood Culture adequate volume Performed at [facility] [address], [place], [place] [address]    Culture   Final    NO GROWTH 4 DAYS Performed at [facility], [place] [address] [place], [place] [address]    Report Status PENDING  Incomplete  Culture, blood (routine x 2)     Status: None (Preliminary result)   Collection Time: 09/13/20 11:53 AM   Specimen: BLOOD  Result Value Ref Range Status   Specimen Description   Final    BLOOD BLOOD RIGHT WRIST Performed at [facility] [address] [place], [place] [address]    Special Requests   Final    BOTTLES DRAWN AEROBIC ONLY Blood Culture adequate volume Performed at [facility] [address], [place], [place] [address]    Culture   Final    NO GROWTH 4 DAYS Performed at [facility], [place] [address] [place], [place] [address]    Report Status PENDING  Incomplete  Urine Culture     Status: None   Collection Time: 09/13/20 11:30 PM   Specimen: Urine, Catheterized  Result Value Ref Range Status   Specimen Description   Final    URINE, CATHETERIZED Performed at [facility] [address], [place], [place] [address]    Special Requests   Final    NONE Performed at [facility], [place] [address] [place], [place] [address]    Culture   Final    NO GROWTH Performed at [facility], [place] [address], [place], [place] [address]    Report Status 09/15/2020 FINAL  Final      Labs:  CBC: Recent Labs  Lab 09/11/20 0654 09/12/20 0658 09/13/20 0734 09/14/20 0510 09/15/20 0505  WBC 5.3 4.7 5.4 7.4 7.1  HGB 10.1* 10.0* 9.9* 11.7* 9.7*  HCT 30.2* 30.3* 29.4* 36.1 30.3*  MCV 91.5 92.9 92.5 93.5 95.0  PLT 38* 43* 52* 74* 83*   BMP &GFR Recent Labs  Lab 09/11/20 0654 09/12/20 0658 09/13/20 0734 09/14/20 0510 09/15/20 0505 09/16/20 0524  NA 134* 132* 130* 136 137  --   K 4.2 4.0 4.1 4.3 3.8  --   CL 100 99 95* 101 103  --   CO2 '27 29 29 27 26  ' --   GLUCOSE 102* 98 91 128* 93  --   BUN '18 17 15 16 20  ' --   CREATININE 0.69 0.75 0.73 0.74 0.79 0.64  CALCIUM 8.2* 8.1* 8.3* 8.4* 8.1*  --   MG 1.7  --  1.8 2.0 1.9  --   PHOS  --   --  2.4* 3.6 1.7*  --    Estimated Creatinine Clearance: 36.1 mL/min (by C-G formula based on SCr of 0.64 mg/dL). Liver & Pancreas: Recent Labs  Lab 09/11/20 0654 09/12/20 0658 09/13/20 0734 09/14/20 0510 09/15/20 0505  AST 42* 35  --   --   --   ALT 33 30  --   --   --   ALKPHOS 66 70  --   --   --   BILITOT 0.5 0.4  --   --   --   PROT 6.4* 7.0  --   --   --  ALBUMIN 2.3* 2.1* 2.1* 2.4* 2.1*   No results for input(s): LIPASE, AMYLASE in the last 168 hours. No results for input(s): AMMONIA in the last 168 hours. Diabetic: No results for input(s): HGBA1C in the last 72 hours. No results for input(s): GLUCAP in the last 168 hours. Cardiac Enzymes: No results for input(s): CKTOTAL, CKMB, CKMBINDEX, TROPONINI in the last 168 hours. No results for input(s): PROBNP in the last 8760 hours. Coagulation Profile: No results for input(s): INR, PROTIME in the last 168 hours. Thyroid Function Tests: No results for input(s): TSH, T4TOTAL, FREET4, T3FREE, THYROIDAB in the last 72 hours. Lipid Profile: No results for input(s): CHOL, HDL, LDLCALC, TRIG, CHOLHDL, LDLDIRECT in the last 72 hours. Anemia Panel: No results for input(s): VITAMINB12, FOLATE, FERRITIN, TIBC, IRON, RETICCTPCT in the last 72 hours. Urine  analysis:    Component Value Date/Time   COLORURINE YELLOW 09/07/2020 2240   APPEARANCEUR CLOUDY (A) 09/07/2020 2240   LABSPEC 1.016 09/07/2020 2240   PHURINE 6.0 09/07/2020 2240   GLUCOSEU NEGATIVE 09/07/2020 2240   HGBUR MODERATE (A) 09/07/2020 2240   BILIRUBINUR NEGATIVE 09/07/2020 2240   KETONESUR 80 (A) 09/07/2020 2240   PROTEINUR 30 (A) 09/07/2020 2240   UROBILINOGEN 0.2 07/05/2012 1610   NITRITE POSITIVE (A) 09/07/2020 2240   LEUKOCYTESUR LARGE (A) 09/07/2020 2240   Sepsis Labs: Invalid input(s): PROCALCITONIN, LACTICIDVEN   Time coordinating discharge: 45 minutes  SIGNED:  Mercy Riding, MD  [facility] 09/17/2020, 10:07 PM  If 7PM-7AM, please contact night-coverage [email or web address]

## 2020-09-18 ENCOUNTER — Encounter: Payer: Self-pay | Admitting: *Deleted

## 2020-09-18 DIAGNOSIS — A4189 Other specified sepsis: Secondary | ICD-10-CM | POA: Diagnosis not present

## 2020-09-18 DIAGNOSIS — N390 Urinary tract infection, site not specified: Secondary | ICD-10-CM | POA: Diagnosis not present

## 2020-09-18 DIAGNOSIS — C50412 Malignant neoplasm of upper-outer quadrant of left female breast: Secondary | ICD-10-CM | POA: Diagnosis not present

## 2020-09-18 DIAGNOSIS — M170 Bilateral primary osteoarthritis of knee: Secondary | ICD-10-CM | POA: Diagnosis not present

## 2020-09-18 DIAGNOSIS — M4316 Spondylolisthesis, lumbar region: Secondary | ICD-10-CM | POA: Diagnosis not present

## 2020-09-18 DIAGNOSIS — M160 Bilateral primary osteoarthritis of hip: Secondary | ICD-10-CM | POA: Diagnosis not present

## 2020-09-18 DIAGNOSIS — Z170 Estrogen receptor positive status [ER+]: Secondary | ICD-10-CM | POA: Diagnosis not present

## 2020-09-18 DIAGNOSIS — C50911 Malignant neoplasm of unspecified site of right female breast: Secondary | ICD-10-CM | POA: Diagnosis not present

## 2020-09-18 DIAGNOSIS — I119 Hypertensive heart disease without heart failure: Secondary | ICD-10-CM | POA: Diagnosis not present

## 2020-09-18 DIAGNOSIS — D630 Anemia in neoplastic disease: Secondary | ICD-10-CM | POA: Diagnosis not present

## 2020-09-18 LAB — SUSCEPTIBILITY RESULT

## 2020-09-18 LAB — CULTURE, BLOOD (ROUTINE X 2)
Culture: NO GROWTH
Culture: NO GROWTH
Special Requests: ADEQUATE
Special Requests: ADEQUATE

## 2020-09-18 LAB — SUSCEPTIBILITY, AER + ANAEROB

## 2020-09-21 DIAGNOSIS — C50412 Malignant neoplasm of upper-outer quadrant of left female breast: Secondary | ICD-10-CM | POA: Diagnosis not present

## 2020-09-21 DIAGNOSIS — C50911 Malignant neoplasm of unspecified site of right female breast: Secondary | ICD-10-CM | POA: Diagnosis not present

## 2020-09-21 DIAGNOSIS — D630 Anemia in neoplastic disease: Secondary | ICD-10-CM | POA: Diagnosis not present

## 2020-09-21 DIAGNOSIS — M160 Bilateral primary osteoarthritis of hip: Secondary | ICD-10-CM | POA: Diagnosis not present

## 2020-09-21 DIAGNOSIS — I119 Hypertensive heart disease without heart failure: Secondary | ICD-10-CM | POA: Diagnosis not present

## 2020-09-21 DIAGNOSIS — M170 Bilateral primary osteoarthritis of knee: Secondary | ICD-10-CM | POA: Diagnosis not present

## 2020-09-21 DIAGNOSIS — Z170 Estrogen receptor positive status [ER+]: Secondary | ICD-10-CM | POA: Diagnosis not present

## 2020-09-21 DIAGNOSIS — A4189 Other specified sepsis: Secondary | ICD-10-CM | POA: Diagnosis not present

## 2020-09-21 DIAGNOSIS — N390 Urinary tract infection, site not specified: Secondary | ICD-10-CM | POA: Diagnosis not present

## 2020-09-22 ENCOUNTER — Encounter: Payer: Self-pay | Admitting: *Deleted

## 2020-09-22 DIAGNOSIS — D630 Anemia in neoplastic disease: Secondary | ICD-10-CM | POA: Diagnosis not present

## 2020-09-22 DIAGNOSIS — C50412 Malignant neoplasm of upper-outer quadrant of left female breast: Secondary | ICD-10-CM | POA: Diagnosis not present

## 2020-09-22 DIAGNOSIS — N390 Urinary tract infection, site not specified: Secondary | ICD-10-CM | POA: Diagnosis not present

## 2020-09-22 DIAGNOSIS — M170 Bilateral primary osteoarthritis of knee: Secondary | ICD-10-CM | POA: Diagnosis not present

## 2020-09-22 DIAGNOSIS — M160 Bilateral primary osteoarthritis of hip: Secondary | ICD-10-CM | POA: Diagnosis not present

## 2020-09-22 DIAGNOSIS — C50911 Malignant neoplasm of unspecified site of right female breast: Secondary | ICD-10-CM | POA: Diagnosis not present

## 2020-09-22 DIAGNOSIS — A4189 Other specified sepsis: Secondary | ICD-10-CM | POA: Diagnosis not present

## 2020-09-22 DIAGNOSIS — I119 Hypertensive heart disease without heart failure: Secondary | ICD-10-CM | POA: Diagnosis not present

## 2020-09-22 DIAGNOSIS — Z170 Estrogen receptor positive status [ER+]: Secondary | ICD-10-CM | POA: Diagnosis not present

## 2020-09-22 LAB — CULTURE, BLOOD (ROUTINE X 2)

## 2020-09-23 DIAGNOSIS — I10 Essential (primary) hypertension: Secondary | ICD-10-CM | POA: Diagnosis not present

## 2020-09-23 DIAGNOSIS — M170 Bilateral primary osteoarthritis of knee: Secondary | ICD-10-CM | POA: Diagnosis not present

## 2020-09-23 DIAGNOSIS — A4189 Other specified sepsis: Secondary | ICD-10-CM | POA: Diagnosis not present

## 2020-09-23 DIAGNOSIS — C50412 Malignant neoplasm of upper-outer quadrant of left female breast: Secondary | ICD-10-CM | POA: Diagnosis not present

## 2020-09-23 DIAGNOSIS — A499 Bacterial infection, unspecified: Secondary | ICD-10-CM | POA: Diagnosis not present

## 2020-09-23 DIAGNOSIS — D630 Anemia in neoplastic disease: Secondary | ICD-10-CM | POA: Diagnosis not present

## 2020-09-23 DIAGNOSIS — D649 Anemia, unspecified: Secondary | ICD-10-CM | POA: Diagnosis not present

## 2020-09-23 DIAGNOSIS — I119 Hypertensive heart disease without heart failure: Secondary | ICD-10-CM | POA: Diagnosis not present

## 2020-09-23 DIAGNOSIS — N390 Urinary tract infection, site not specified: Secondary | ICD-10-CM | POA: Diagnosis not present

## 2020-09-23 DIAGNOSIS — R6520 Severe sepsis without septic shock: Secondary | ICD-10-CM | POA: Diagnosis not present

## 2020-09-23 DIAGNOSIS — D693 Immune thrombocytopenic purpura: Secondary | ICD-10-CM | POA: Diagnosis not present

## 2020-09-23 DIAGNOSIS — Z170 Estrogen receptor positive status [ER+]: Secondary | ICD-10-CM | POA: Diagnosis not present

## 2020-09-23 DIAGNOSIS — Z853 Personal history of malignant neoplasm of breast: Secondary | ICD-10-CM | POA: Diagnosis not present

## 2020-09-23 DIAGNOSIS — E43 Unspecified severe protein-calorie malnutrition: Secondary | ICD-10-CM | POA: Diagnosis not present

## 2020-09-23 DIAGNOSIS — M160 Bilateral primary osteoarthritis of hip: Secondary | ICD-10-CM | POA: Diagnosis not present

## 2020-09-23 DIAGNOSIS — C50911 Malignant neoplasm of unspecified site of right female breast: Secondary | ICD-10-CM | POA: Diagnosis not present

## 2020-09-23 DIAGNOSIS — A419 Sepsis, unspecified organism: Secondary | ICD-10-CM | POA: Diagnosis not present

## 2020-09-23 NOTE — Progress Notes (Deleted)
Menarche: Does not recall Age at first live birth: 87 years old [place] P 4 LMP 81s Hysterectomy?  Yes BSO?  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  Her children are [name], who lives in [place] and works for [name]) lives in [place] and runs his own business, and [name]") who lives in [place] and works for [employer]  He is also training his children to box.  Son [name] died from hepatitis C at age 62.  The patient has 11 grandchildren and more great grandchildren and great great-grandchildren than she can count.  She attends a local [place]: Not in place.  At the 05/28/2019 visit the patient was given the appropriate documents to complete and notarized at her discretion.   HEALTH MAINTENANCE: Social History   Tobacco Use   Smoking status: Never   Smokeless tobacco: Never  Vaping Use   Vaping Use: Never used  Substance Use Topics   Alcohol use: No   Drug use: No    Frequency: 3.0 times per week     Colonoscopy: Never  PAP: Remote  Bone density: Never   No Known Allergies  Current Outpatient Medications  Medication Sig Dispense Refill   acetaminophen (TYLENOL) 500 MG tablet Take 500-1,000 mg by mouth every 6 (six) hours as needed for mild pain or headache.     amLODipine (NORVASC) 5 MG tablet Take 1 tablet (5 mg total) by mouth daily. 30 tablet 1   amoxicillin (AMOXIL) 500 MG capsule Take 2 capsules (1,000 mg total) by mouth every 8 (eight) hours for 38 doses. 76 capsule 0   anastrozole (ARIMIDEX) 1 MG tablet Take 1 tablet (1 mg total) by mouth daily. 90 tablet 4   Calcium Carb-Cholecalciferol (CALCIUM 600+D3) 600-800 MG-UNIT TABS Take 2 tablets by mouth in the morning.     diphenhydrAMINE (BENADRYL) 12.5 MG/5ML liquid Take 12.5-25 mg by mouth daily as needed for allergies.     esomeprazole (NEXIUM) 20 MG  capsule Take 20 mg by mouth daily as needed (acid reflux).     feeding supplement (ENSURE ENLIVE / ENSURE PLUS) LIQD Take 237 mLs by mouth 2 (two) times daily between meals. 14220 mL 0   gabapentin (NEURONTIN) 100 MG capsule Take 100 mg by mouth 3 (three) times daily.     mirtazapine (REMERON) 15 MG tablet Take 1 tablet (15 mg total) by mouth at bedtime. 30 tablet 1   Multiple Vitamin (MULTIVITAMIN WITH MINERALS) TABS tablet Take 1 tablet by mouth daily.     predniSONE (DELTASONE) 20 MG tablet Take 2 tablets (40 mg total) by mouth daily with breakfast for 10 days. 20 tablet 0   triamcinolone ointment (KENALOG) 0.5 % Apply 1 application topically 2 (two) times daily. 30 g 0   No current facility-administered medications for this visit.    OBJECTIVE: African-American woman examined in a wheelchair  There were no vitals filed for this visit.    There is no height or weight on file to calculate BMI.   Wt Readings from Last 3 Encounters:  09/08/20 103 lb 13.4 oz (47.1 kg)  05/27/20 108 lb 12.8 oz (49.4 kg)  04/02/20 112 lb 1.6 oz (50.8 kg)  ECOG FS:2 - Symptomatic, <50% confined to bed GENERAL: Patient is a well appearing female in no acute distress HEENT:  Sclerae anicteric.  Oropharynx clear and moist. No ulcerations or evidence of oropharyngeal candidiasis. Neck is supple.  NODES:  No cervical, supraclavicular, or axillary lymphadenopathy palpated.  BREAST EXAM:  Deferred. LUNGS:  Clear to auscultation bilaterally.  No wheezes or rhonchi. HEART:  Regular rate and rhythm. No murmur appreciated. ABDOMEN:  Soft, nontender.  Positive, normoactive bowel sounds. No organomegaly palpated. MSK:  No focal spinal tenderness to palpation. Full range of motion bilaterally in the upper extremities. EXTREMITIES:  No peripheral edema.   SKIN:  Clear with no obvious rashes or skin changes. No nail dyscrasia. NEURO:  Nonfocal. Well oriented.  Appropriate affect.   LAB RESULTS:  CMP     Component  Value Date/Time   NA 137 09/15/2020 0505   K 3.8 09/15/2020 0505   CL 103 09/15/2020 0505   CO2 26 09/15/2020 0505   GLUCOSE 93 09/15/2020 0505   BUN 20 09/15/2020 0505   CREATININE 0.64 09/16/2020 0524   CREATININE 1.03 (H) 05/27/2020 1401   CALCIUM 8.1 (L) 09/15/2020 0505   PROT 7.0 09/12/2020 0658   ALBUMIN 2.1 (L) 09/15/2020 0505   AST 35 09/12/2020 0658   AST 17 05/27/2020 1401   ALT 30 09/12/2020 0658   ALT 9 05/27/2020 1401   ALKPHOS 70 09/12/2020 0658   BILITOT 0.4 09/12/2020 0658   BILITOT 0.9 05/27/2020 1401   GFRNONAA >60 09/16/2020 0524   GFRNONAA 53 (L) 05/27/2020 1401   GFRAA 47 (L) 11/01/2019 1142    No results found for: TOTALPROTELP, ALBUMINELP, A1GS, A2GS, BETS, BETA2SER, GAMS, MSPIKE, SPEI  Lab Results  Component Value Date   WBC 7.1 09/15/2020   NEUTROABS 4.4 09/09/2020   HGB 9.7 (L) 09/15/2020   HCT 30.3 (L) 09/15/2020   MCV 95.0 09/15/2020   PLT 83 (L) 09/15/2020    No results found for: LABCA2  No components found for: [id number]  No results for input(s): INR in the last 168 hours.  No results found for: LABCA2  No results found for: ZRA076  No results found for: AUQ333  No results found for: LKT625  No results found for: CA2729  No components found for: HGQUANT  No results found for: CEA1 / No results found for: CEA1   No results found for: AFPTUMOR  No results found for: CHROMOGRNA  No results found for: KPAFRELGTCHN, LAMBDASER, KAPLAMBRATIO (kappa/lambda light chains)  No results found for: HGBA, HGBA2QUANT, HGBFQUANT, HGBSQUAN (Hemoglobinopathy evaluation)   No results found for: LDH  No results found for: IRON, TIBC, IRONPCTSAT (Iron and TIBC)  No results found for: FERRITIN  Urinalysis    Component Value Date/Time   COLORURINE YELLOW 09/07/2020 2240   APPEARANCEUR CLOUDY (A) 09/07/2020 2240   LABSPEC 1.016 09/07/2020 2240   PHURINE 6.0 09/07/2020 2240   GLUCOSEU NEGATIVE 09/07/2020 2240   HGBUR MODERATE (A)  09/07/2020 2240   BILIRUBINUR NEGATIVE 09/07/2020 2240   KETONESUR 80 (A) 09/07/2020 2240   PROTEINUR 30 (A) 09/07/2020 2240   UROBILINOGEN 0.2 07/05/2012 1610   NITRITE POSITIVE (A) 09/07/2020 2240   LEUKOCYTESUR LARGE (A) 09/07/2020 2240    STUDIES: DG Chest Port 1 View  Result Date: 09/07/2020 CLINICAL DATA:  Questionable sepsis - evaluate for abnormality EXAM: PORTABLE CHEST 1 VIEW COMPARISON:  Radiograph 07/05/2012, chest CT 06/07/2019 FINDINGS: Unchanged cardiomediastinal silhouette. Aortic arch calcifications. There is no focal airspace disease. There is no large pleural effusion or visible pneumothorax. There is no acute osseous abnormality. Dextroconvex curvature of the spine. IMPRESSION: No focal airspace consolidation. Electronically Signed  By: Maurine Simmering   On: 09/07/2020 22:01   ECHOCARDIOGRAM COMPLETE  Result Date: 09/14/2020    ECHOCARDIOGRAM REPORT   Patient Name:   IVIE SAVITT Date of Exam: 09/14/2020 Medical Rec #:  [id number]      Height:       61.0 in Accession #:    [id number]     Weight:       103.8 lb Date of Birth:  December 04, 1930       BSA:          1.430 m Patient Age:    59 years       BP:           127/61 mmHg Patient Gender: F              HR:           82 bpm. Exam Location:  Inpatient Procedure: Cardiac Doppler, Color Doppler and 2D Echo Indications:    Bacteremia R78.81  History:        Patient has prior history of Echocardiogram examinations, most                 recent 07/12/2014. Risk Factors:Hypertension. Past history of                 breast cancer. Severe sepsis due to Aerococcus urinae                 bacteremia.  Sonographer:    Darlina Sicilian RDCS Referring Phys: [id number] Charlesetta Ivory GONFA  Sonographer Comments: Technically limited study due to chest wall pain. IMPRESSIONS  1. Left ventricular ejection fraction, by estimation, is 60 to 65%. The left ventricle has normal function. The left ventricle has no regional wall motion abnormalities. There is mild left  ventricular hypertrophy. Left ventricular diastolic parameters are indeterminate.  2. Right ventricule is poorly visualized but appears grossly normal size and systolic function. Tricuspid regurgitation signal is inadequate for assessing PA pressure.  3. The mitral valve is grossly normal. No evidence of mitral valve regurgitation. Mild mitral stenosis. MG 38mHg at HR 86bpm.  4. The aortic valve was not well visualized. Aortic valve regurgitation is trivial. Moderate aortic valve stenosis. Vmax 3.3 m/s, MG 26 mmHg, DI 0.38  5. The inferior vena cava is normal in size with greater than 50% respiratory variability, suggesting right atrial pressure of 3 mmHg. Conclusion(s)/Recommendation(s): No clear vegetation seen, but technically difficult study with limited views. If clinical suspicion for endocarditis, recommend TEE. FINDINGS  Left Ventricle: Left ventricular ejection fraction, by estimation, is 60 to 65%. The left ventricle has normal function. The left ventricle has no regional wall motion abnormalities. The left ventricular internal cavity size was normal in size. There is  mild left ventricular hypertrophy. Left ventricular diastolic parameters are indeterminate. Right Ventricle: The right ventricular size is normal. Right vetricular wall thickness was not well visualized. Right ventricular systolic function is normal. Tricuspid regurgitation signal is inadequate for assessing PA pressure. Left Atrium: Left atrial size was normal in size. Right Atrium: Right atrial size was not well visualized. Pericardium: There is no evidence of pericardial effusion. Mitral Valve: The mitral valve is grossly normal. No evidence of mitral valve regurgitation. Mild mitral valve stenosis. Tricuspid Valve: The tricuspid valve is not well visualized. Tricuspid valve regurgitation is not demonstrated. Aortic Valve: The aortic valve was not well visualized. Aortic valve regurgitation is trivial. Aortic regurgitation PHT measures  289 msec. Moderate aortic stenosis is present. Aortic valve  mean gradient measures 16.7 mmHg. Aortic valve peak gradient measures 27.0 mmHg. Aortic valve area, by VTI measures 0.46 cm. Pulmonic Valve: The pulmonic valve was not well visualized. Pulmonic valve regurgitation is not visualized. Aorta: The aortic root is normal in size and structure. Venous: The inferior vena cava is normal in size with greater than 50% respiratory variability, suggesting right atrial pressure of 3 mmHg. IAS/Shunts: The interatrial septum was not well visualized.  LEFT VENTRICLE PLAX 2D LVOT diam:     1.10 cm  Diastology LV SV:         23       LV e' medial:    4.80 cm/s LV SV Index:   16       LV E/e' medial:  14.4 LVOT Area:     0.95 cm LV e' lateral:   5.11 cm/s                         LV E/e' lateral: 13.6  LEFT ATRIUM           Index LA Vol (A4C): 26.9 ml 18.82 ml/m  AORTIC VALVE AV Area (Vmax):    0.49 cm AV Area (Vmean):   0.45 cm AV Area (VTI):     0.46 cm AV Vmax:           260.00 cm/s AV Vmean:          177.333 cm/s AV VTI:            0.505 m AV Peak Grad:      27.0 mmHg AV Mean Grad:      16.7 mmHg LVOT Vmax:         133.00 cm/s LVOT Vmean:        83.800 cm/s LVOT VTI:          0.245 m LVOT/AV VTI ratio: 0.49 AI PHT:            289 msec  AORTA Ao Root diam: 2.10 cm MITRAL VALVE MV Area (PHT): 3.06 cm     SHUNTS MV Decel Time: 248 msec     Systemic VTI:  0.24 m MV E velocity: 69.30 cm/s   Systemic Diam: 1.10 cm MV A velocity: 112.00 cm/s MV E/A ratio:  0.62 Oswaldo Milian MD Electronically signed by Oswaldo Milian MD Signature Date/Time: 09/14/2020/3:46:05 PM    Final       ELIGIBLE FOR AVAILABLE RESEARCH PROTOCOL: AET  ASSESSMENT: 87 y.o. [place] woman status post bilateral breast biopsies on 05/17/2019 as follows:  (a) on the left upper outer quadrant, a clinical T2 N2, stage IIIA invasive ductal carcinoma,grade 2 or 3, estrogen receptor positive, progesterone receptor negative, with no HER-2  amplification and an MIB-1 of 12%  (b) on the right overlapping sites, 2 morphologically distinct invasive ductal carcinomas, both clinically T1c N0, stage IA, grade 1 or 2, estrogen receptor positive, progesterone receptor negative, with no HER-2 amplification and an MIB-1 of 5%  (c) CT of the chest abdomen and pelvis 06/10/2019 shows left axillary, retropectoral and supraclavicular lymph nodes, and a 4.1 cm left breast mass; no right axillary adenopathy; 0.6 cm nonspecific left lower lobe pulmonary nodule; no liver or bone lesions.  (d) bone scan 06/26/2019--no evidence of skeletal metastases    (1) neoadjuvant anastrozole started 05/28/2019, palbociclib added 05/30/2019 125 mg a day, 21 on 7 off  (a) palbociclib dose decreased cycle 2 (May 2021) to 100 mg daily, 21/7  (b) bilateral breast  and axilla ultrasonography 10/04/2019 shows a good initial response to treatment  (c) repeat bilateral ultrasonography 046 2022 continues to show evidence of response  (2) genetics testing declined  (3) definitive surgery pending  (4) adjuvant radiation to follow   PLAN: Deloris is tolerating her treatment well and it continues to work for her.     Total encounter time 30 minutes.Wilber Bihari, NP 09/23/20 2:40 PM Medical Oncology and Hematology [facility] [place], [place] [address] Tel. [phone]    Fax. [phone]    *Total Encounter Time as defined by the Centers for Medicare and Medicaid Services includes, in addition to the face-to-face time of a patient visit (documented in the note above) non-face-to-face time: obtaining and reviewing outside history, ordering and reviewing medications, tests or procedures, care coordination (communications with other health care professionals or caregivers) and documentation in the medical record.

## 2020-09-24 ENCOUNTER — Telehealth: Payer: Self-pay | Admitting: *Deleted

## 2020-09-24 ENCOUNTER — Ambulatory Visit: Payer: Medicare HMO

## 2020-09-24 ENCOUNTER — Other Ambulatory Visit: Payer: Medicare HMO

## 2020-09-24 ENCOUNTER — Ambulatory Visit: Payer: Medicare HMO | Admitting: Adult Health

## 2020-09-24 NOTE — Telephone Encounter (Signed)
This RN contacted [facility] at [phone] per pt unable to come in today.  Spoke with case manager - requested weekly CBC with Diff x 6 per MD.  Note lab will be drawn tomorrow and likely not result out until Monday.  This RN gave fax number for this office as well as direct nurse number for contact if needed.

## 2020-09-25 DIAGNOSIS — M160 Bilateral primary osteoarthritis of hip: Secondary | ICD-10-CM | POA: Diagnosis not present

## 2020-09-25 DIAGNOSIS — N390 Urinary tract infection, site not specified: Secondary | ICD-10-CM | POA: Diagnosis not present

## 2020-09-25 DIAGNOSIS — C50412 Malignant neoplasm of upper-outer quadrant of left female breast: Secondary | ICD-10-CM | POA: Diagnosis not present

## 2020-09-25 DIAGNOSIS — I119 Hypertensive heart disease without heart failure: Secondary | ICD-10-CM | POA: Diagnosis not present

## 2020-09-25 DIAGNOSIS — A419 Sepsis, unspecified organism: Secondary | ICD-10-CM | POA: Diagnosis not present

## 2020-09-25 DIAGNOSIS — Z170 Estrogen receptor positive status [ER+]: Secondary | ICD-10-CM | POA: Diagnosis not present

## 2020-09-25 DIAGNOSIS — D630 Anemia in neoplastic disease: Secondary | ICD-10-CM | POA: Diagnosis not present

## 2020-09-25 DIAGNOSIS — M170 Bilateral primary osteoarthritis of knee: Secondary | ICD-10-CM | POA: Diagnosis not present

## 2020-09-25 DIAGNOSIS — A4189 Other specified sepsis: Secondary | ICD-10-CM | POA: Diagnosis not present

## 2020-09-25 DIAGNOSIS — C50911 Malignant neoplasm of unspecified site of right female breast: Secondary | ICD-10-CM | POA: Diagnosis not present

## 2020-09-28 ENCOUNTER — Encounter: Payer: Self-pay | Admitting: *Deleted

## 2020-09-29 ENCOUNTER — Emergency Department (HOSPITAL_COMMUNITY): Payer: Medicare HMO

## 2020-09-29 ENCOUNTER — Other Ambulatory Visit: Payer: Self-pay

## 2020-09-29 ENCOUNTER — Inpatient Hospital Stay (HOSPITAL_COMMUNITY)
Admission: EM | Admit: 2020-09-29 | Discharge: 2020-10-15 | DRG: 175 | Disposition: E | Payer: Medicare HMO | Attending: Pulmonary Disease | Admitting: Pulmonary Disease

## 2020-09-29 ENCOUNTER — Inpatient Hospital Stay (HOSPITAL_COMMUNITY): Payer: Medicare HMO

## 2020-09-29 ENCOUNTER — Encounter (HOSPITAL_COMMUNITY): Payer: Self-pay | Admitting: Emergency Medicine

## 2020-09-29 DIAGNOSIS — E878 Other disorders of electrolyte and fluid balance, not elsewhere classified: Secondary | ICD-10-CM | POA: Diagnosis present

## 2020-09-29 DIAGNOSIS — J811 Chronic pulmonary edema: Secondary | ICD-10-CM | POA: Diagnosis present

## 2020-09-29 DIAGNOSIS — I10 Essential (primary) hypertension: Secondary | ICD-10-CM | POA: Diagnosis present

## 2020-09-29 DIAGNOSIS — D638 Anemia in other chronic diseases classified elsewhere: Secondary | ICD-10-CM | POA: Diagnosis present

## 2020-09-29 DIAGNOSIS — Y95 Nosocomial condition: Secondary | ICD-10-CM | POA: Diagnosis present

## 2020-09-29 DIAGNOSIS — Z87440 Personal history of urinary (tract) infections: Secondary | ICD-10-CM

## 2020-09-29 DIAGNOSIS — Z170 Estrogen receptor positive status [ER+]: Secondary | ICD-10-CM

## 2020-09-29 DIAGNOSIS — D649 Anemia, unspecified: Secondary | ICD-10-CM

## 2020-09-29 DIAGNOSIS — Z66 Do not resuscitate: Secondary | ICD-10-CM | POA: Diagnosis present

## 2020-09-29 DIAGNOSIS — Z681 Body mass index (BMI) 19 or less, adult: Secondary | ICD-10-CM | POA: Diagnosis not present

## 2020-09-29 DIAGNOSIS — Z7189 Other specified counseling: Secondary | ICD-10-CM | POA: Diagnosis not present

## 2020-09-29 DIAGNOSIS — Z79811 Long term (current) use of aromatase inhibitors: Secondary | ICD-10-CM

## 2020-09-29 DIAGNOSIS — R778 Other specified abnormalities of plasma proteins: Secondary | ICD-10-CM | POA: Diagnosis present

## 2020-09-29 DIAGNOSIS — Z515 Encounter for palliative care: Secondary | ICD-10-CM | POA: Diagnosis not present

## 2020-09-29 DIAGNOSIS — I959 Hypotension, unspecified: Secondary | ICD-10-CM | POA: Diagnosis not present

## 2020-09-29 DIAGNOSIS — R300 Dysuria: Secondary | ICD-10-CM | POA: Diagnosis not present

## 2020-09-29 DIAGNOSIS — J9811 Atelectasis: Secondary | ICD-10-CM | POA: Diagnosis not present

## 2020-09-29 DIAGNOSIS — T380X5A Adverse effect of glucocorticoids and synthetic analogues, initial encounter: Secondary | ICD-10-CM | POA: Diagnosis not present

## 2020-09-29 DIAGNOSIS — Z87898 Personal history of other specified conditions: Secondary | ICD-10-CM

## 2020-09-29 DIAGNOSIS — G2581 Restless legs syndrome: Secondary | ICD-10-CM | POA: Diagnosis present

## 2020-09-29 DIAGNOSIS — I2609 Other pulmonary embolism with acute cor pulmonale: Secondary | ICD-10-CM

## 2020-09-29 DIAGNOSIS — N6320 Unspecified lump in the left breast, unspecified quadrant: Secondary | ICD-10-CM | POA: Diagnosis not present

## 2020-09-29 DIAGNOSIS — R0602 Shortness of breath: Secondary | ICD-10-CM | POA: Diagnosis not present

## 2020-09-29 DIAGNOSIS — J9690 Respiratory failure, unspecified, unspecified whether with hypoxia or hypercapnia: Secondary | ICD-10-CM

## 2020-09-29 DIAGNOSIS — Z4682 Encounter for fitting and adjustment of non-vascular catheter: Secondary | ICD-10-CM | POA: Diagnosis not present

## 2020-09-29 DIAGNOSIS — R0789 Other chest pain: Secondary | ICD-10-CM | POA: Diagnosis not present

## 2020-09-29 DIAGNOSIS — M1990 Unspecified osteoarthritis, unspecified site: Secondary | ICD-10-CM | POA: Diagnosis not present

## 2020-09-29 DIAGNOSIS — Z862 Personal history of diseases of the blood and blood-forming organs and certain disorders involving the immune mechanism: Secondary | ICD-10-CM

## 2020-09-29 DIAGNOSIS — K7200 Acute and subacute hepatic failure without coma: Secondary | ICD-10-CM | POA: Diagnosis not present

## 2020-09-29 DIAGNOSIS — Z4659 Encounter for fitting and adjustment of other gastrointestinal appliance and device: Secondary | ICD-10-CM

## 2020-09-29 DIAGNOSIS — N390 Urinary tract infection, site not specified: Secondary | ICD-10-CM | POA: Diagnosis not present

## 2020-09-29 DIAGNOSIS — E43 Unspecified severe protein-calorie malnutrition: Secondary | ICD-10-CM | POA: Diagnosis present

## 2020-09-29 DIAGNOSIS — R54 Age-related physical debility: Secondary | ICD-10-CM | POA: Diagnosis present

## 2020-09-29 DIAGNOSIS — R7989 Other specified abnormal findings of blood chemistry: Secondary | ICD-10-CM | POA: Diagnosis present

## 2020-09-29 DIAGNOSIS — G928 Other toxic encephalopathy: Secondary | ICD-10-CM | POA: Diagnosis not present

## 2020-09-29 DIAGNOSIS — J189 Pneumonia, unspecified organism: Secondary | ICD-10-CM

## 2020-09-29 DIAGNOSIS — Z20822 Contact with and (suspected) exposure to covid-19: Secondary | ICD-10-CM | POA: Diagnosis not present

## 2020-09-29 DIAGNOSIS — K219 Gastro-esophageal reflux disease without esophagitis: Secondary | ICD-10-CM | POA: Diagnosis present

## 2020-09-29 DIAGNOSIS — I2693 Single subsegmental pulmonary embolism without acute cor pulmonale: Secondary | ICD-10-CM | POA: Diagnosis not present

## 2020-09-29 DIAGNOSIS — R031 Nonspecific low blood-pressure reading: Secondary | ICD-10-CM | POA: Diagnosis present

## 2020-09-29 DIAGNOSIS — I248 Other forms of acute ischemic heart disease: Secondary | ICD-10-CM | POA: Diagnosis present

## 2020-09-29 DIAGNOSIS — Z452 Encounter for adjustment and management of vascular access device: Secondary | ICD-10-CM | POA: Diagnosis not present

## 2020-09-29 DIAGNOSIS — Z79899 Other long term (current) drug therapy: Secondary | ICD-10-CM

## 2020-09-29 DIAGNOSIS — E8770 Fluid overload, unspecified: Secondary | ICD-10-CM | POA: Diagnosis not present

## 2020-09-29 DIAGNOSIS — R079 Chest pain, unspecified: Secondary | ICD-10-CM | POA: Diagnosis not present

## 2020-09-29 DIAGNOSIS — D6489 Other specified anemias: Secondary | ICD-10-CM | POA: Diagnosis not present

## 2020-09-29 DIAGNOSIS — R000 Tachycardia, unspecified: Secondary | ICD-10-CM | POA: Diagnosis not present

## 2020-09-29 DIAGNOSIS — I313 Pericardial effusion (noninflammatory): Secondary | ICD-10-CM | POA: Diagnosis present

## 2020-09-29 DIAGNOSIS — I2699 Other pulmonary embolism without acute cor pulmonale: Secondary | ICD-10-CM | POA: Diagnosis present

## 2020-09-29 DIAGNOSIS — Z7401 Bed confinement status: Secondary | ICD-10-CM

## 2020-09-29 DIAGNOSIS — R1110 Vomiting, unspecified: Secondary | ICD-10-CM | POA: Diagnosis present

## 2020-09-29 DIAGNOSIS — R0902 Hypoxemia: Secondary | ICD-10-CM | POA: Diagnosis not present

## 2020-09-29 DIAGNOSIS — J90 Pleural effusion, not elsewhere classified: Secondary | ICD-10-CM | POA: Diagnosis not present

## 2020-09-29 DIAGNOSIS — J9601 Acute respiratory failure with hypoxia: Secondary | ICD-10-CM

## 2020-09-29 DIAGNOSIS — K6389 Other specified diseases of intestine: Secondary | ICD-10-CM | POA: Diagnosis not present

## 2020-09-29 DIAGNOSIS — J810 Acute pulmonary edema: Secondary | ICD-10-CM | POA: Diagnosis not present

## 2020-09-29 DIAGNOSIS — N179 Acute kidney failure, unspecified: Secondary | ICD-10-CM | POA: Diagnosis not present

## 2020-09-29 DIAGNOSIS — B965 Pseudomonas (aeruginosa) (mallei) (pseudomallei) as the cause of diseases classified elsewhere: Secondary | ICD-10-CM | POA: Diagnosis present

## 2020-09-29 DIAGNOSIS — R918 Other nonspecific abnormal finding of lung field: Secondary | ICD-10-CM | POA: Diagnosis not present

## 2020-09-29 DIAGNOSIS — C50811 Malignant neoplasm of overlapping sites of right female breast: Secondary | ICD-10-CM | POA: Diagnosis present

## 2020-09-29 DIAGNOSIS — R6510 Systemic inflammatory response syndrome (SIRS) of non-infectious origin without acute organ dysfunction: Secondary | ICD-10-CM | POA: Diagnosis not present

## 2020-09-29 DIAGNOSIS — Z90710 Acquired absence of both cervix and uterus: Secondary | ICD-10-CM

## 2020-09-29 DIAGNOSIS — A419 Sepsis, unspecified organism: Secondary | ICD-10-CM | POA: Diagnosis not present

## 2020-09-29 DIAGNOSIS — E871 Hypo-osmolality and hyponatremia: Secondary | ICD-10-CM | POA: Diagnosis present

## 2020-09-29 DIAGNOSIS — D693 Immune thrombocytopenic purpura: Secondary | ICD-10-CM | POA: Diagnosis present

## 2020-09-29 DIAGNOSIS — R748 Abnormal levels of other serum enzymes: Secondary | ICD-10-CM | POA: Diagnosis not present

## 2020-09-29 DIAGNOSIS — R231 Pallor: Secondary | ICD-10-CM | POA: Diagnosis not present

## 2020-09-29 DIAGNOSIS — I517 Cardiomegaly: Secondary | ICD-10-CM | POA: Diagnosis not present

## 2020-09-29 DIAGNOSIS — Z95828 Presence of other vascular implants and grafts: Secondary | ICD-10-CM

## 2020-09-29 DIAGNOSIS — R739 Hyperglycemia, unspecified: Secondary | ICD-10-CM | POA: Diagnosis not present

## 2020-09-29 DIAGNOSIS — Z8673 Personal history of transient ischemic attack (TIA), and cerebral infarction without residual deficits: Secondary | ICD-10-CM

## 2020-09-29 DIAGNOSIS — F419 Anxiety disorder, unspecified: Secondary | ICD-10-CM | POA: Diagnosis not present

## 2020-09-29 LAB — RESP PANEL BY RT-PCR (FLU A&B, COVID) ARPGX2
Influenza A by PCR: NEGATIVE
Influenza B by PCR: NEGATIVE
SARS Coronavirus 2 by RT PCR: NEGATIVE

## 2020-09-29 LAB — CBC WITH DIFFERENTIAL/PLATELET
Abs Immature Granulocytes: 0.43 10*3/uL — ABNORMAL HIGH (ref 0.00–0.07)
Basophils Absolute: 0.1 10*3/uL (ref 0.0–0.1)
Basophils Relative: 0 %
Eosinophils Absolute: 0 10*3/uL (ref 0.0–0.5)
Eosinophils Relative: 0 %
HCT: 31.3 % — ABNORMAL LOW (ref 36.0–46.0)
Hemoglobin: 10.1 g/dL — ABNORMAL LOW (ref 12.0–15.0)
Immature Granulocytes: 2 %
Lymphocytes Relative: 5 %
Lymphs Abs: 1 10*3/uL (ref 0.7–4.0)
MCH: 30.3 pg (ref 26.0–34.0)
MCHC: 32.3 g/dL (ref 30.0–36.0)
MCV: 94 fL (ref 80.0–100.0)
Monocytes Absolute: 0.9 10*3/uL (ref 0.1–1.0)
Monocytes Relative: 4 %
Neutro Abs: 18 10*3/uL — ABNORMAL HIGH (ref 1.7–7.7)
Neutrophils Relative %: 89 %
Platelets: 217 10*3/uL (ref 150–400)
RBC: 3.33 MIL/uL — ABNORMAL LOW (ref 3.87–5.11)
RDW: 17.5 % — ABNORMAL HIGH (ref 11.5–15.5)
WBC: 20.3 10*3/uL — ABNORMAL HIGH (ref 4.0–10.5)
nRBC: 0 % (ref 0.0–0.2)

## 2020-09-29 LAB — COMPREHENSIVE METABOLIC PANEL
ALT: 61 U/L — ABNORMAL HIGH (ref 0–44)
AST: 46 U/L — ABNORMAL HIGH (ref 15–41)
Albumin: 2.5 g/dL — ABNORMAL LOW (ref 3.5–5.0)
Alkaline Phosphatase: 78 U/L (ref 38–126)
Anion gap: 10 (ref 5–15)
BUN: 17 mg/dL (ref 8–23)
CO2: 26 mmol/L (ref 22–32)
Calcium: 8.8 mg/dL — ABNORMAL LOW (ref 8.9–10.3)
Chloride: 96 mmol/L — ABNORMAL LOW (ref 98–111)
Creatinine, Ser: 0.8 mg/dL (ref 0.44–1.00)
GFR, Estimated: >60 mL/min (ref >60)
Glucose, Bld: 187 mg/dL — ABNORMAL HIGH (ref 70–99)
Potassium: 4.4 mmol/L (ref 3.5–5.1)
Sodium: 132 mmol/L — ABNORMAL LOW (ref 135–145)
Total Bilirubin: 0.8 mg/dL (ref 0.3–1.2)
Total Protein: 6.3 g/dL — ABNORMAL LOW (ref 6.5–8.1)

## 2020-09-29 LAB — URINALYSIS, ROUTINE W REFLEX MICROSCOPIC
Bilirubin Urine: NEGATIVE
Glucose, UA: NEGATIVE mg/dL
Hgb urine dipstick: NEGATIVE
Ketones, ur: 5 mg/dL — AB
Leukocytes,Ua: NEGATIVE
Nitrite: NEGATIVE
Protein, ur: NEGATIVE mg/dL
Specific Gravity, Urine: >1.046 — ABNORMAL HIGH (ref 1.005–1.030)
pH: 7 (ref 5.0–8.0)

## 2020-09-29 LAB — TROPONIN I (HIGH SENSITIVITY)
Troponin I (High Sensitivity): 155 ng/L (ref <18)
Troponin I (High Sensitivity): 270 ng/L (ref <18)
Troponin I (High Sensitivity): 317 ng/L (ref <18)
Troponin I (High Sensitivity): 576 ng/L (ref <18)

## 2020-09-29 LAB — ECHOCARDIOGRAM LIMITED
AR max vel: 1.07 cm2
AV Area VTI: 1.01 cm2
AV Area mean vel: 0.98 cm2
AV Mean grad: 24 mmHg
AV Peak grad: 38.6 mmHg
Ao pk vel: 3.11 m/s
Area-P 1/2: 6.07 cm2
Calc EF: 53.5 %
Height: 61 in
MV M vel: 5.06 m/s
MV Peak grad: 102.4 mmHg
MV VTI: 2.29 cm2
S' Lateral: 3.3 cm
Single Plane A2C EF: 62.9 %
Single Plane A4C EF: 47.6 %
Weight: 1661.39 oz

## 2020-09-29 LAB — HEPARIN LEVEL (UNFRACTIONATED): Heparin Unfractionated: 0.49 IU/mL (ref 0.30–0.70)

## 2020-09-29 LAB — BRAIN NATRIURETIC PEPTIDE: B Natriuretic Peptide: 238.7 pg/mL — ABNORMAL HIGH (ref 0.0–100.0)

## 2020-09-29 LAB — LACTIC ACID, PLASMA
Lactic Acid, Venous: 1.5 mmol/L (ref 0.5–1.9)
Lactic Acid, Venous: 1.9 mmol/L (ref 0.5–1.9)

## 2020-09-29 MED ORDER — ONDANSETRON HCL 4 MG PO TABS: 4.0000 mg | ORAL_TABLET | Freq: Four times a day (QID) | ORAL | Status: DC | PRN

## 2020-09-29 MED ORDER — TRIAMCINOLONE ACETONIDE 0.5 % EX OINT
1.0000 "application " | TOPICAL_OINTMENT | Freq: Every day | CUTANEOUS | Status: DC
  Administered 2020-10-01 – 2020-10-10 (×10): 1 via TOPICAL
  Filled 2020-09-29 (×3): qty 15

## 2020-09-29 MED ORDER — HEPARIN (PORCINE) 25000 UT/250ML-% IV SOLN
700.0000 [IU]/h | INTRAVENOUS | Status: DC
  Administered 2020-09-29: 700 [IU]/h via INTRAVENOUS
  Filled 2020-09-29: qty 250

## 2020-09-29 MED ORDER — NITROGLYCERIN 2 % TD OINT
1.0000 [in_us] | TOPICAL_OINTMENT | Freq: Once | TRANSDERMAL | Status: AC
  Administered 2020-09-29: 1 [in_us] via TOPICAL
  Filled 2020-09-29: qty 1

## 2020-09-29 MED ORDER — MIRTAZAPINE 15 MG PO TABS
15.0000 mg | ORAL_TABLET | Freq: Every day | ORAL | Status: DC
  Administered 2020-09-29 – 2020-10-03 (×5): 15 mg via ORAL
  Filled 2020-09-29 (×2): qty 1
  Filled 2020-09-29: qty 2
  Filled 2020-09-29: qty 1
  Filled 2020-09-29: qty 2
  Filled 2020-09-29: qty 1

## 2020-09-29 MED ORDER — ACETAMINOPHEN 325 MG PO TABS
650.0000 mg | ORAL_TABLET | Freq: Once | ORAL | Status: AC
  Administered 2020-09-29: 650 mg via ORAL
  Filled 2020-09-29: qty 2

## 2020-09-29 MED ORDER — ONDANSETRON HCL 4 MG/2ML IJ SOLN: 4.0000 mg | Freq: Four times a day (QID) | INTRAMUSCULAR | Status: DC | PRN

## 2020-09-29 MED ORDER — SODIUM CHLORIDE 0.9 % IV BOLUS
500.0000 mL | INTRAVENOUS | Status: AC
  Administered 2020-09-29: 500 mL via INTRAVENOUS

## 2020-09-29 MED ORDER — ACETAMINOPHEN 325 MG PO TABS
650.0000 mg | ORAL_TABLET | Freq: Four times a day (QID) | ORAL | Status: DC | PRN
  Administered 2020-09-30 (×2): 650 mg via ORAL
  Filled 2020-09-29 (×2): qty 2

## 2020-09-29 MED ORDER — SODIUM CHLORIDE 0.9% FLUSH
3.0000 mL | Freq: Two times a day (BID) | INTRAVENOUS | Status: DC
  Administered 2020-09-29 – 2020-10-10 (×20): 3 mL via INTRAVENOUS

## 2020-09-29 MED ORDER — IOHEXOL 350 MG/ML SOLN
80.0000 mL | Freq: Once | INTRAVENOUS | Status: AC | PRN
  Administered 2020-09-29: 80 mL via INTRAVENOUS

## 2020-09-29 MED ORDER — SODIUM CHLORIDE 0.9 % IV SOLN: INTRAVENOUS | Status: DC

## 2020-09-29 MED ORDER — ACETAMINOPHEN 650 MG RE SUPP: 650.0000 mg | Freq: Four times a day (QID) | RECTAL | Status: DC | PRN

## 2020-09-29 MED ORDER — ALBUTEROL SULFATE (2.5 MG/3ML) 0.083% IN NEBU: 2.5000 mg | INHALATION_SOLUTION | Freq: Four times a day (QID) | RESPIRATORY_TRACT | Status: DC | PRN

## 2020-09-29 MED ORDER — HEPARIN BOLUS VIA INFUSION
2000.0000 [IU] | Freq: Once | INTRAVENOUS | Status: AC
  Administered 2020-09-29: 2000 [IU] via INTRAVENOUS
  Filled 2020-09-29: qty 2000

## 2020-09-29 MED ORDER — SODIUM CHLORIDE 0.9 % IV BOLUS
500.0000 mL | Freq: Once | INTRAVENOUS | Status: AC
  Administered 2020-09-29: 500 mL via INTRAVENOUS

## 2020-09-29 MED ORDER — AMOXICILLIN 500 MG PO CAPS
1000.0000 mg | ORAL_CAPSULE | Freq: Three times a day (TID) | ORAL | Status: DC
  Administered 2020-09-29 (×2): 1000 mg via ORAL
  Filled 2020-09-29 (×4): qty 2

## 2020-09-29 MED ORDER — ENSURE ENLIVE PO LIQD
237.0000 mL | Freq: Two times a day (BID) | ORAL | Status: DC
  Administered 2020-09-29 – 2020-10-04 (×9): 237 mL via ORAL
  Filled 2020-09-29 (×2): qty 237

## 2020-09-29 MED ORDER — ANASTROZOLE 1 MG PO TABS
1.0000 mg | ORAL_TABLET | Freq: Every day | ORAL | Status: DC
  Administered 2020-09-29 – 2020-10-04 (×6): 1 mg via ORAL
  Filled 2020-09-29 (×7): qty 1

## 2020-09-29 MED ORDER — ASPIRIN 325 MG PO TABS: 325.0000 mg | ORAL_TABLET | Freq: Every day | ORAL | Status: DC

## 2020-09-29 NOTE — ED Notes (Signed)
Pt appears to be asleep. NAD noted. 

## 2020-09-29 NOTE — ED Notes (Signed)
Daughter [name] [phone] would like an update

## 2020-09-29 NOTE — Progress Notes (Signed)
  Echocardiogram 2D Echocardiogram has been performed.  Michelle [name] 10/13/2020, 9:21 AM

## 2020-09-29 NOTE — ED Provider Notes (Signed)
Emergency Medicine Provider Triage Evaluation Note  Michelle [name] , a 87 y.o. female  was evaluated in triage.  Pt complains of chest pain that began today.  She continues to endorse dysuria.  She vomited twice earlier today.  She is alert and oriented x3.  She denies any new bruising.  Patient had a recent admission for sepsis secondary to UTI.  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  Per chart review, she has not had repeat CBC with differential since she has been discharged from the hospital.  Review of Systems  Positive: Chest pain, vomiting, dysuria Negative: Bruising, Fever, shortness of breath, rash, abdominal pain  Physical Exam  There were no vitals taken for this visit. Gen:   Awake, no distress   Resp:  Normal effort  MSK:   Moves extremities without difficulty  Other:  No petechiae or purpura noted to the arms or legs.  Alert and oriented x3.  Medical Decision Making  Medically screening exam initiated at 12:35 AM.  Appropriate orders placed.  [name] was informed that the remainder of the evaluation will be completed by another provider, this initial triage assessment does not replace that evaluation, and the importance of remaining in the ED until their evaluation is complete.  Labs and imaging have been ordered.  She will require further work-up and evaluation in the emergency department.   Joline Maxcy A, PA-C 10/12/2020 0105    Ezequiel Essex, MD 09/21/2020 [phone]

## 2020-09-29 NOTE — Progress Notes (Signed)
Lower extremity venous has been completed.   Preliminary results in CV Proc.   Abram Sander 09/26/2020 9:00 AM

## 2020-09-29 NOTE — ED Notes (Signed)
I messaged Dr. Tamala Julian about the heparin. There's no recheck labs in there for the heparin so I asked if he wants those ordered, or it to just not be titrated.

## 2020-09-29 NOTE — Progress Notes (Signed)
ANTICOAGULATION CONSULT NOTE - Initial Consult  Pharmacy Consult for Heparin  Indication: pulmonary embolus, rule out ACS  No Known Allergies  Patient Measurements: ~50 kg  Vital Signs: Temp: 97.7 F (36.5 C) (08/16 0110) Temp Source: Oral (08/16 0110) BP: 124/51 (08/16 0515) Pulse Rate: 105 (08/16 0515)  Labs: Recent Labs    10/05/2020 0120 10/02/2020 0324  HGB 10.1*  --   HCT 31.3*  --   PLT 217  --   CREATININE 0.80  --   TROPONINIHS 155* 270*    CrCl cannot be calculated (Unknown ideal weight.).   Medical History: Past Medical History:  Diagnosis Date   Arthritis    Cancer ([place])    Bilaterial Breast Cancer   Dyspnea    GERD (gastroesophageal reflux disease)    Hypertension    Stroke [facility])    "mini stroke" per patient     Assessment: 87 y/o F with history of breast cancer, found to have small PE and elevated troponin. Requested to start heparin per pharmacy. She is recovering from an episode on ITP with a nice platelet recovery up to 217. Hgb 10.1 Renal function good. No anti-coagulation PTA.   Goal of Therapy:  Heparin level 0.3-0.7 units/ml Monitor platelets by anticoagulation protocol: Yes   Plan:  Heparin 2000 units BOLUS Start heparin drip at 700 units/hr 1500 Heparin level Daily CBC/Heparin level Monitor for bleeding  Narda Bonds, PharmD, BCPS Clinical Pharmacist Phone: [phone]

## 2020-09-29 NOTE — ED Notes (Signed)
Informed provider of +trop and WBC.

## 2020-09-29 NOTE — ED Notes (Signed)
I took pt upstairs via stretcher on monitor/on heparin gtt.

## 2020-09-29 NOTE — Progress Notes (Signed)
ANTICOAGULATION CONSULT NOTE - Initial Consult  Pharmacy Consult for Heparin  Indication: pulmonary embolus  No Known Allergies  Patient Measurements: 47.1 kg  Vital Signs: Temp: 97.4 F (36.3 C) (08/16 1725) Temp Source: Oral (08/16 1725) BP: 103/52 (08/16 1600) Pulse Rate: 110 (08/16 1600)  Labs: Recent Labs    10/09/2020 0120 10/09/2020 0324 09/15/2020 0510 10/10/2020 1113 09/25/2020 1620  HGB 10.1*  --   --   --   --   HCT 31.3*  --   --   --   --   PLT 217  --   --   --   --   HEPARINUNFRC  --   --   --   --  0.49  CREATININE 0.80  --   --   --   --   TROPONINIHS 155* 270* 317* 576*  --     Estimated Creatinine Clearance: 36.1 mL/min (by C-G formula based on SCr of 0.8 mg/dL).   Medical History: Past Medical History:  Diagnosis Date   Arthritis    Cancer ([place])    Bilaterial Breast Cancer   Dyspnea    GERD (gastroesophageal reflux disease)    Hypertension    Stroke [facility])    "mini stroke" per patient     Assessment: 87 y/o F with history of breast cancer, found to have small PE and elevated troponin. Requested to start heparin per pharmacy. She is recovering from an episode on ITP with a nice platelet recovery up to 217. Hgb 10.1 Renal function good. No anti-coagulation PTA.   Goal of Therapy:  Heparin level 0.3-0.7 units/ml Monitor platelets by anticoagulation protocol: Yes   Plan:  Continue heparin drip at 700 units/hr 0000 Heparin level Daily CBC/Heparin level Monitor for signs and symptoms of bleeding   Varney Daily, PharmD PGY1 Pharmacy Resident  Please check AMION for all [facility] pharmacy phone numbers After 10:00 PM call main pharmacy [phone]

## 2020-09-29 NOTE — ED Notes (Signed)
I introduced myself to pt/daughter at bedside. Pt AxO x4 with GCS 15. On 2L [place]. Pt denies pain, is resting comfortably in bed with purewick. Heparin level drawn/sent to lab. Pt denies further needs.

## 2020-09-29 NOTE — ED Notes (Signed)
Admitting physician at bedside speaking with pt/family.

## 2020-09-29 NOTE — ED Provider Notes (Signed)
[facility] EMERGENCY DEPARTMENT Provider Note   CSN: [id number] Arrival date & time: 10/08/2020  0033     History Chief Complaint  Patient presents with   Chest Pain    Michelle [name] is a 87 y.o. female.  HPI     This is an 87 year old female who presents with chest pain.  History of hypertension and stroke.  States chest pain started over the last 24 hours.  She states "it just is uncomfortable."  She describes it as pressure nonradiating.  It is currently 9 out of 10.  She has not noted any diaphoresis.  She does report some shortness of breath and dysuria.  Denies any leg swelling or history of blood clots.  Recent admission for sepsis secondary to UTI.  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  She has had 2 episodes of nonbilious, nonbloody emesis today.  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[place])    Acute ITP ([place]) 09/11/2020   Thrombocytopenia ([name]) 09/07/2020   Anemia 09/07/2020   Sepsis secondary to UTI ([place]) 09/07/2020   Malignant neoplasm of overlapping sites of right breast in female, estrogen receptor positive ([place]) 05/28/2019   Malignant neoplasm of upper-outer quadrant of left breast in female, estrogen receptor positive ([place]) 05/27/2019   Hypertension    Arthritis    Near syncope 07/11/2014   Syncope 07/11/2014   PULMONARY FIBROSIS 02/12/2007   GERD 11/30/2006   COUGH, CHRONIC 11/30/2006    Past Surgical History:  Procedure Laterality Date   ABDOMINAL HYSTERECTOMY     APPENDECTOMY     SHOULDER SURGERY     Right     OB History   No obstetric history on file.     Family History  Adopted: Yes    Social History   Tobacco Use   Smoking status: Never    Smokeless tobacco: Never  Vaping Use   Vaping Use: Never used  Substance Use Topics   Alcohol use: No   Drug use: No    Frequency: 3.0 times per week    Home Medications Prior to Admission medications   Medication Sig Start Date End Date Taking? Authorizing Provider  acetaminophen (TYLENOL) 500 MG tablet Take 500-1,000 mg by mouth every 6 (six) hours as needed for mild pain or headache.    [provider]  amLODipine (NORVASC) 5 MG tablet Take 1 tablet (5 mg total) by mouth daily. 09/16/20 11/15/20  Mercy Riding, MD  amoxicillin (AMOXIL) 500 MG capsule Take 2 capsules (1,000 mg total) by mouth every 8 (eight) hours for 38 doses. 09/16/20 10/06/2020  Mercy Riding, MD  anastrozole (ARIMIDEX) 1 MG tablet Take 1 tablet (1 mg total) by mouth daily. 06/26/20   Magrinat, Virgie Dad, MD  Calcium Carb-Cholecalciferol (CALCIUM 600+D3) 600-800 MG-UNIT TABS Take 2 tablets by mouth in the morning.    [provider]  diphenhydrAMINE (BENADRYL) 12.5 MG/5ML liquid Take 12.5-25 mg by mouth daily as needed for allergies.    [provider]  esomeprazole (NEXIUM) 20 MG capsule Take 20 mg by mouth daily as needed (acid reflux).    [provider]  feeding supplement (ENSURE ENLIVE / ENSURE PLUS) LIQD Take 237 mLs by mouth 2 (two) times daily between meals. 09/16/20 10/16/20  Mercy Riding, MD  gabapentin (NEURONTIN) 100 MG capsule Take 100 mg by mouth 3 (three) times daily. 01/01/20   [provider]  mirtazapine (REMERON) 15 MG tablet Take 1 tablet (15 mg total) by mouth at bedtime. 09/16/20   Mercy Riding, MD  Multiple Vitamin (MULTIVITAMIN WITH MINERALS) TABS tablet Take 1 tablet by mouth daily. 09/16/20   Mercy Riding, MD  triamcinolone ointment (KENALOG) 0.5 % Apply 1 application topically 2 (two) times daily. 12/27/19   Gardenia Phlegm, NP    Allergies    Patient has no known allergies.  Review of Systems   Review of Systems  Constitutional:  Negative for  fever.  Respiratory:  Positive for shortness of breath.   Cardiovascular:  Positive for chest pain. Negative for leg swelling.  Gastrointestinal:  Positive for nausea and vomiting. Negative for abdominal pain.  Genitourinary:  Positive for dysuria.  All other systems reviewed and are negative.  Physical Exam Updated Vital Signs BP (!) 112/48   Pulse 91   Temp 97.7 F (36.5 C) (Oral)   Resp (!) 23   SpO2 94%   Physical Exam Vitals and nursing note reviewed.  Constitutional:      Appearance: She is well-developed.     Comments: Elderly, chronically ill-appearing, nontoxic  HENT:     Head: Normocephalic and atraumatic.     Mouth/Throat:     Mouth: Mucous membranes are dry.  Eyes:     Pupils: Pupils are equal, round, and reactive to light.  Cardiovascular:     Rate and Rhythm: Normal rate and regular rhythm.     Heart sounds: Murmur heard.  Pulmonary:     Effort: Pulmonary effort is normal. No respiratory distress.     Breath sounds: No wheezing.  Abdominal:     Palpations: Abdomen is soft.     Tenderness: There is no abdominal tenderness.  Musculoskeletal:     Cervical back: Neck supple.  Skin:    General: Skin is warm and dry.  Neurological:     Mental Status: She is alert and oriented to person, place, and time.  Psychiatric:        Mood and Affect: Mood normal.    ED Results / Procedures / Treatments   Labs (all labs ordered are listed, but only abnormal results are displayed) Labs Reviewed  CBC WITH DIFFERENTIAL/PLATELET - Abnormal; Notable for the following components:      Result Value   WBC 20.3 (*)    RBC 3.33 (*)    Hemoglobin 10.1 (*)    HCT 31.3 (*)    RDW 17.5 (*)    Neutro Abs 18.0 (*)    Abs Immature Granulocytes 0.43 (*)    All other components within normal limits  COMPREHENSIVE METABOLIC PANEL - Abnormal; Notable for the following components:   Sodium 132 (*)    Chloride 96 (*)    Glucose, Bld 187 (*)    Calcium 8.8 (*)    Total Protein  6.3 (*)    Albumin 2.5 (*)    AST 46 (*)    ALT 61 (*)    All other components within normal limits  URINALYSIS, ROUTINE W REFLEX MICROSCOPIC - Abnormal; Notable for the following components:   Specific Gravity, Urine >1.046 (*)    Ketones, ur 5 (*)    All  other components within normal limits  BRAIN NATRIURETIC PEPTIDE - Abnormal; Notable for the following components:   B Natriuretic Peptide 238.7 (*)    All other components within normal limits  TROPONIN I (HIGH SENSITIVITY) - Abnormal; Notable for the following components:   Troponin I (High Sensitivity) 155 (*)    All other components within normal limits  TROPONIN I (HIGH SENSITIVITY) - Abnormal; Notable for the following components:   Troponin I (High Sensitivity) 270 (*)    All other components within normal limits  RESP PANEL BY RT-PCR (FLU A&B, COVID) ARPGX2  CULTURE, BLOOD (ROUTINE X 2)  CULTURE, BLOOD (ROUTINE X 2)  URINE CULTURE  LACTIC ACID, PLASMA  LACTIC ACID, PLASMA  TROPONIN I (HIGH SENSITIVITY)    EKG EKG Interpretation  Date/Time:  Tuesday September 29 2020 01:08:52 EDT Ventricular Rate:  92 PR Interval:  160 QRS Duration: 66 QT Interval:  334 QTC Calculation: 413 R Axis:   -16 Text Interpretation: Normal sinus rhythm Inferior infarct , age undetermined Anterior infarct , age undetermined Abnormal ECG Confirmed by Thayer Jew [phone]) on 09/23/2020 2:42:06 AM  Radiology DG Chest 2 View  Result Date: 09/15/2020 CLINICAL DATA:  Chest pain EXAM: CHEST - 2 VIEW COMPARISON:  09/07/2020 FINDINGS: Cardiac shadow is stable. Aortic calcifications are again seen. The lungs are well aerated bilaterally. No focal infiltrate is seen. Mild blunting of the right costophrenic angle posteriorly is noted. No bony abnormality is noted. IMPRESSION: Likely small right pleural effusion.  No focal infiltrate is noted. Electronically Signed   By: Inez Catalina M.D.   On: 10/04/2020 01:26   CT Angio Chest PE W and/or Wo  Contrast  Result Date: 09/21/2020 CLINICAL DATA:  High probability for pulmonary embolism EXAM: CT ANGIOGRAPHY CHEST WITH CONTRAST TECHNIQUE: Multidetector CT imaging of the chest was performed using the standard protocol during bolus administration of intravenous contrast. Multiplanar CT image reconstructions and MIPs were obtained to evaluate the vascular anatomy. CONTRAST:  66m OMNIPAQUE IOHEXOL 350 MG/ML SOLN COMPARISON:  06/07/2019 FINDINGS: Cardiovascular: Single subsegmental pulmonary artery filling defect with acute features in the right upper lobe as marked on series 7. Cardiomegaly. Small volume pericardial effusion. Aortic atherosclerosis. No acute aortic finding. Mediastinum/Nodes: Negative for adenopathy or mass. Left breast mass on prior which is not seen today. Lungs/Pleura: Dependent atelectasis that is mild. There is no edema, consolidation, effusion, or pneumothorax. Upper Abdomen: No acute finding Musculoskeletal: Diffuse spinal degeneration with scoliosis. Glenohumeral osteoarthritis bilaterally. Review of the MIP images confirms the above findings. Critical Value/emergent results were called by telephone at the time of interpretation on 09/18/2020 at 5:07 am [facility] who verbally acknowledged these results. IMPRESSION: Single subsegmental pulmonary embolism to the right upper lobe. Electronically Signed   By: JMonte FantasiaM.D.   On: 09/17/2020 05:07    Procedures .Critical Care  Date/Time: 10/07/2020 6:21 AM Performed by: HMerryl Hacker MD Authorized by: HMerryl Hacker MD   Critical care provider statement:    Critical care time (minutes):  45   Critical care was time spent personally by me on the following activities:  Discussions with consultants, evaluation of patient's response to treatment, examination of patient, ordering and performing treatments and interventions, ordering and review of laboratory studies, ordering and review of radiographic  studies, pulse oximetry, re-evaluation of patient's condition, obtaining history from patient or surrogate and review of old charts   Medications Ordered in ED Medications  heparin bolus via infusion 2,000 Units (has  no administration in time range)  heparin ADULT infusion 100 units/mL (25000 units/214m) (has no administration in time range)  acetaminophen (TYLENOL) tablet 650 mg (650 mg Oral Given 10/12/2020 0221)  nitroGLYCERIN (NITROGLYN) 2 % ointment 1 inch (1 inch Topical Given 09/18/2020 0339)  iohexol (OMNIPAQUE) 350 MG/ML injection 80 mL (80 mLs Intravenous Contrast Given 09/24/2020 0434)    ED Course  I have reviewed the triage vital signs and the nursing notes.  Pertinent labs & imaging results that were available during my care of the patient were reviewed by me and considered in my medical decision making (see chart for details).  Clinical Course as of 10/05/2020 [id number] Tue Sep 29, 2020  0552 Spoke to pharmacist.  Patient with recent history of ITP although it appears her platelets have recovered.  Reports no contraindication to heparin but can dose conservatively.  Have placed order for heparin per pharmacy. [CH]  [phone] currently resting comfortably.  Chest pain-free. [CH]    Clinical Course User Index [CH] Chelsea Pedretti, CBarbette Hair MD   MDM Rules/Calculators/A&P                           Patient presents with chest discomfort.  Recent admission and sepsis secondary to UTI.  Reports persistent dysuria as well.  EKG shows no evidence of acute ischemia or arrhythmia.  Initial troponin 155.  Potentially concerning for primary ACS although other considerations such as PE are potential.  Lab work notable for mild hyponatremia and hypochloremia.  Patient also has a leukocytosis to 20.3.  Reports persistent dysuria.  Is afebrile.  Blood cultures and septic work-up initiated.  Lactate is normal.  Chest x-ray questionable for pleural effusion.  CT PE study obtained both to evaluate for PE and  to further decipher x-ray findings.  CT is positive for subsegmental PE.  No evidence of pneumonia.  Urinalysis does not show any evidence of UTI.  Given persistent dysuria, I did send culture.  Spoke with pharmacist.  Patient is hemodynamically stable.  Will dose heparin per pharmacy protocol.  Troponin continues to trend upwards but patient is now pain-free.  We will plan for admission to the hospitalist.  Troponin could be secondary to PE vs true ACS.    Final Clinical Impression(s) / ED Diagnoses Final diagnoses:  Single subsegmental pulmonary embolism without acute cor pulmonale (HCC)  Elevated troponin    Rx / DC Orders ED Discharge Orders     None        HMerryl Hacker MD 09/18/2020 [phone]

## 2020-09-29 NOTE — H&P (Addendum)
History and Physical    Michelle [name] [id number] DOB: 1930-03-26 DOA: 09/20/2020  Referring MD/NP/PA: Inda Merlin, MD PCP: [name], MD  Patient coming from: Home with daughter via EMS  Chief Complaint: Chest pain  I have personally briefly reviewed patient's old medical records in [place]   HPI: Michelle [name] is a 87 y.o. female with medical history significant of hypertension, breast cancer on ibrance, and GERD who presented with complaints of chest pain.  History is somewhat limited from the patient at this time due to her being lethargic.  History is obtained from review of records and talks with her daughter. She was just recently been hospitalized from 7/25-8/4 with sepsis due to bacteremia and UTI with acute ITP.  Blood culture grew Aerococcus urinae.  ID had been consulted.  Patient had initially been started on IV Rocephin, switched to IV ampicillin and vancomycin on 7/31, vancomycin discontinued on 8/1, ampicillin continued 8/3, and ultimately discharged home on amoxicillin to complete a 13-day course to be completed on 8/16.  TTE no signs of vegetation and repeat cultures showing no growth.  During her hospital stay she also received 4 units of FFP, IVIG (tx 7/28 and 7/30), and 3 units of packed red blood cells.  Over the last day she reported developing chest pain as well as back pain, and did not feel well.  Reported chest pain as a pressure feeling, but is currently unable to tell me where the pain was located specifically.  Since being home she has been mostly sedentary.  Noted associated symptoms of myalgias in her legs(suffers from RLS), shortness of breath, mild dysuria with urination(was not using hydrocortisone cream), joint pain, nausea, and vomiting.  Last night she had 2 episodes of nonbloody emesis that daughter reports was related to chest pain.  Daughter confirms that the patient had been taking all her medications as prescribed.  She had completed  prednisone the other day and had few pills left of amoxicillin to take.  Patient did not report any complaints of diarrhea, physical therapy had just recently been set up to come out to the house.  EMS reported that the patient had not been getting pain medications from her caregiver.  Vitals with EMS noted heart rate 114, O2 saturation 96% on room air, blood pressure 140/54, and CBG 181.  However, looking at notes patient was recommended to discontinue tramadol due to delirium and to continue on Tylenol.  ED Course: Upon admission to the emergency department patient was noted to be afebrile, pulse 88 112, respiration 10-25, blood pressure 106/32-135/43, and O2 saturations maintained on room air.  Labs significant for WBC 20.3, hemoglobin 10.1, platelets 217, sodium 132, BUN 17, creatinine 0.8, glucose 187, albumin 2.5, AST 46, ALT 61, lactic acid 1.5, BNP 238.7, and troponin 115-> 270.  Chest x-ray showed small right-sided pleural effusion.  Urinalysis showed no signs of infection.  CT angiogram of the chest was obtained revealing a single subsegmental pulmonary artery defect of the right upper lobe concerning for acute PE.  Patient was started on a heparin drip per pharmacy.  TRH called to admit.  Review of Systems  Constitutional:  Positive for malaise/fatigue. Negative for fever.  HENT:  Negative for ear pain and nosebleeds.   Eyes:  Negative for double vision and photophobia.  Respiratory:  Positive for shortness of breath. Negative for cough.   Cardiovascular:  Positive for chest pain. Negative for leg swelling.  Gastrointestinal:  Positive for nausea and  vomiting. Negative for diarrhea.  Genitourinary:  Positive for dysuria. Negative for hematuria.  Musculoskeletal:  Positive for back pain, joint pain and myalgias.  Skin:  Negative for rash.  Neurological:  Positive for weakness. Negative for speech change and loss of consciousness.  Psychiatric/Behavioral:  Negative for substance abuse.     Past Medical History:  Diagnosis Date   Arthritis    Cancer ([place])    Bilaterial Breast Cancer   Dyspnea    GERD (gastroesophageal reflux disease)    Hypertension    Stroke ([place])    "mini stroke" per patient    Past Surgical History:  Procedure Laterality Date   ABDOMINAL HYSTERECTOMY     APPENDECTOMY     SHOULDER SURGERY     Right     reports that she has never smoked. She has never used smokeless tobacco. She reports that she does not drink alcohol and does not use drugs.  No Known Allergies  Family History  Adopted: Yes    Prior to Admission medications   Medication Sig Start Date End Date Taking? Authorizing Provider  acetaminophen (TYLENOL) 500 MG tablet Take 500-1,000 mg by mouth every 6 (six) hours as needed for mild pain or headache.    [provider]  amLODipine (NORVASC) 5 MG tablet Take 1 tablet (5 mg total) by mouth daily. 09/16/20 11/15/20  Mercy Riding, MD  amoxicillin (AMOXIL) 500 MG capsule Take 2 capsules (1,000 mg total) by mouth every 8 (eight) hours for 38 doses. 09/16/20 09/24/2020  Mercy Riding, MD  anastrozole (ARIMIDEX) 1 MG tablet Take 1 tablet (1 mg total) by mouth daily. 06/26/20   Magrinat, Virgie Dad, MD  Calcium Carb-Cholecalciferol (CALCIUM 600+D3) 600-800 MG-UNIT TABS Take 2 tablets by mouth in the morning.    [provider]  diphenhydrAMINE (BENADRYL) 12.5 MG/5ML liquid Take 12.5-25 mg by mouth daily as needed for allergies.    [provider]  esomeprazole (NEXIUM) 20 MG capsule Take 20 mg by mouth daily as needed (acid reflux).    [provider]  feeding supplement (ENSURE ENLIVE / ENSURE PLUS) LIQD Take 237 mLs by mouth 2 (two) times daily between meals. 09/16/20 10/16/20  Mercy Riding, MD  gabapentin (NEURONTIN) 100 MG capsule Take 100 mg by mouth 3 (three) times daily. 01/01/20   [provider]  mirtazapine (REMERON) 15 MG tablet Take 1 tablet (15 mg total) by mouth at bedtime. 09/16/20   Mercy Riding,  MD  Multiple Vitamin (MULTIVITAMIN WITH MINERALS) TABS tablet Take 1 tablet by mouth daily. 09/16/20   Mercy Riding, MD  triamcinolone ointment (KENALOG) 0.5 % Apply 1 application topically 2 (two) times daily. 12/27/19   Gardenia Phlegm, NP    Physical Exam:  Constitutional: Frail elderly female who appears chronically ill Vitals:   10/12/2020 0515 09/26/2020 0530 09/15/2020 0545 10/04/2020 0600  BP: (!) 124/51 (!) 106/30 (!) 114/44 (!) 112/48  Pulse: (!) 105 92 90 91  Resp: 19 20 (!) 25 (!) 23  Temp:      TempSrc:      SpO2: 96% 92% 94% 94%   Eyes: PERRL, lids and conjunctivae normal ENMT: Mucous membranes are dry. Posterior pharynx clear of any exudate or lesions. Patient is almost edentulous Neck: normal, supple, no masses, no thyromegaly Respiratory: Mildly tachypneic with decreased overall aeration.  No significant wheezes or rhonchi appreciated. Cardiovascular: Regular rate and rhythm, no murmurs / rubs / gallops. No extremity edema. 2+ pedal pulses. No  carotid bruits.  Abdomen: no tenderness, no masses palpated. No hepatosplenomegaly. Bowel sounds positive.  Musculoskeletal: no clubbing / cyanosis. No joint deformity upper and lower extremities. Good ROM, no contractures. Normal muscle tone.  Skin: no rashes, lesions, ulcers. No induration.  Poor skin turgor Neurologic: CN 2-12 grossly intact.  Patient appears able to move all extremities Psychiatric: Normal judgment and insight.  Lethargic, but oriented x 3. Normal mood.     Labs on Admission: I have personally reviewed following labs and imaging studies  CBC: Recent Labs  Lab 09/14/2020 0120  WBC 20.3*  NEUTROABS 18.0*  HGB 10.1*  HCT 31.3*  MCV 94.0  PLT [id number]   Basic Metabolic Panel: Recent Labs  Lab 10/10/2020 0120  NA 132*  K 4.4  CL 96*  CO2 26  GLUCOSE 187*  BUN 17  CREATININE 0.80  CALCIUM 8.8*   GFR: CrCl cannot be calculated (Unknown ideal weight.). Liver Function Tests: Recent Labs  Lab  10/02/2020 0120  AST 46*  ALT 61*  ALKPHOS 78  BILITOT 0.8  PROT 6.3*  ALBUMIN 2.5*   No results for input(s): LIPASE, AMYLASE in the last 168 hours. No results for input(s): AMMONIA in the last 168 hours. Coagulation Profile: No results for input(s): INR, PROTIME in the last 168 hours. Cardiac Enzymes: No results for input(s): CKTOTAL, CKMB, CKMBINDEX, TROPONINI in the last 168 hours. BNP (last 3 results) No results for input(s): PROBNP in the last 8760 hours. HbA1C: No results for input(s): HGBA1C in the last 72 hours. CBG: No results for input(s): GLUCAP in the last 168 hours. Lipid Profile: No results for input(s): CHOL, HDL, LDLCALC, TRIG, CHOLHDL, LDLDIRECT in the last 72 hours. Thyroid Function Tests: No results for input(s): TSH, T4TOTAL, FREET4, T3FREE, THYROIDAB in the last 72 hours. Anemia Panel: No results for input(s): VITAMINB12, FOLATE, FERRITIN, TIBC, IRON, RETICCTPCT in the last 72 hours. Urine analysis:    Component Value Date/Time   COLORURINE YELLOW 09/23/2020 0516   APPEARANCEUR CLEAR 09/28/2020 0516   LABSPEC >1.046 (H) 09/30/2020 0516   PHURINE 7.0 10/10/2020 0516   GLUCOSEU NEGATIVE 09/19/2020 0516   HGBUR NEGATIVE 09/26/2020 0516   BILIRUBINUR NEGATIVE 10/07/2020 0516   KETONESUR 5 (A) 10/08/2020 0516   PROTEINUR NEGATIVE 09/15/2020 0516   UROBILINOGEN 0.2 07/05/2012 1610   NITRITE NEGATIVE 09/19/2020 0516   LEUKOCYTESUR NEGATIVE 10/10/2020 0516   Sepsis Labs: Recent Results (from the past 240 hour(s))  Resp Panel by RT-PCR (Flu A&B, Covid) Nasopharyngeal Swab     Status: None   Collection Time: 09/17/2020  3:29 AM   Specimen: Nasopharyngeal Swab; Nasopharyngeal(NP) swabs in vial transport medium  Result Value Ref Range Status   SARS Coronavirus 2 by RT PCR NEGATIVE NEGATIVE Final    Comment: (NOTE) SARS-CoV-2 target nucleic acids are NOT DETECTED.  The SARS-CoV-2 RNA is generally detectable in upper respiratory specimens during the acute  phase of infection. The lowest concentration of SARS-CoV-2 viral copies this assay can detect is 138 copies/mL. A negative result does not preclude SARS-Cov-2 infection and should not be used as the sole basis for treatment or other patient management decisions. A negative result may occur with  improper specimen collection/handling, submission of specimen other than nasopharyngeal swab, presence of viral mutation(s) within the areas targeted by this assay, and inadequate number of viral copies(<138 copies/mL). A negative result must be combined with clinical observations, patient history, and epidemiological information. The expected result is Negative.  Fact Sheet for Patients:  EntrepreneurPulse.com.au  Fact Sheet for Healthcare Providers:  IncredibleEmployment.be  This test is no t yet approved or cleared by the [place] FDA and  has been authorized for detection and/or diagnosis of SARS-CoV-2 by FDA under an Emergency Use Authorization (EUA). This EUA will remain  in effect (meaning this test can be used) for the duration of the COVID-19 declaration under Section 564(b)(1) of the Act, 21 U.S.C.section 360bbb-3(b)(1), unless the authorization is terminated  or revoked sooner.       Influenza A by PCR NEGATIVE NEGATIVE Final   Influenza B by PCR NEGATIVE NEGATIVE Final    Comment: (NOTE) The Xpert Xpress SARS-CoV-2/FLU/RSV plus assay is intended as an aid in the diagnosis of influenza from Nasopharyngeal swab specimens and should not be used as a sole basis for treatment. Nasal washings and aspirates are unacceptable for Xpert Xpress SARS-CoV-2/FLU/RSV testing.  Fact Sheet for Patients: EntrepreneurPulse.com.au  Fact Sheet for Healthcare Providers: IncredibleEmployment.be  This test is not yet approved or cleared by the [place] FDA and has been authorized for detection and/or diagnosis of  SARS-CoV-2 by FDA under an Emergency Use Authorization (EUA). This EUA will remain in effect (meaning this test can be used) for the duration of the COVID-19 declaration under Section 564(b)(1) of the Act, 21 U.S.C. section 360bbb-3(b)(1), unless the authorization is terminated or revoked.  Performed at [facility], [place] [address] [place], [place] [address]      Radiological Exams on Admission: DG Chest 2 View  Result Date: 09/25/2020 CLINICAL DATA:  Chest pain EXAM: CHEST - 2 VIEW COMPARISON:  09/07/2020 FINDINGS: Cardiac shadow is stable. Aortic calcifications are again seen. The lungs are well aerated bilaterally. No focal infiltrate is seen. Mild blunting of the right costophrenic angle posteriorly is noted. No bony abnormality is noted. IMPRESSION: Likely small right pleural effusion.  No focal infiltrate is noted. Electronically Signed   By: Inez Catalina M.D.   On: 09/16/2020 01:26   CT Angio Chest PE W and/or Wo Contrast  Result Date: 09/15/2020 CLINICAL DATA:  High probability for pulmonary embolism EXAM: CT ANGIOGRAPHY CHEST WITH CONTRAST TECHNIQUE: Multidetector CT imaging of the chest was performed using the standard protocol during bolus administration of intravenous contrast. Multiplanar CT image reconstructions and MIPs were obtained to evaluate the vascular anatomy. CONTRAST:  1m OMNIPAQUE IOHEXOL 350 MG/ML SOLN COMPARISON:  06/07/2019 FINDINGS: Cardiovascular: Single subsegmental pulmonary artery filling defect with acute features in the right upper lobe as marked on series 7. Cardiomegaly. Small volume pericardial effusion. Aortic atherosclerosis. No acute aortic finding. Mediastinum/Nodes: Negative for adenopathy or mass. Left breast mass on prior which is not seen today. Lungs/Pleura: Dependent atelectasis that is mild. There is no edema, consolidation, effusion, or pneumothorax. Upper Abdomen: No acute finding Musculoskeletal: Diffuse spinal degeneration with  scoliosis. Glenohumeral osteoarthritis bilaterally. Review of the MIP images confirms the above findings. Critical Value/emergent results were called by telephone at the time of interpretation on 09/30/2020 at 5:07 am to [facility], who verbally acknowledged these results. IMPRESSION: Single subsegmental pulmonary embolism to the right upper lobe. Electronically Signed   By: JMonte FantasiaM.D.   On: 10/13/2020 05:07    EKG: Independently reviewed.  Normal sinus rhythm at 92 bpm with indeterminate a inferior and anterior infarct similar to previous studies  Assessment/Plan Acute pulmonary embolism without acute cor pulmonale: Patient presented with complaints of chest and back pain over the last 24 hours.  CT angiogram of the chest significant for a  single subsegmental pulmonary embolism of the right upper lobe.  O2 saturations currently maintained on room air.  Risk factors include sedentary lifestyle and malignancy.  Patient was started on a heparin drip. -Admit to medical telemetry bed -Continuous pulse oximetry with nasal cannula oxygen maintain O2 saturation greater than 92% -Bedrest for 24 hours -Continue heparin per pharmacy -Check Doppler ultrasound of the lower extremity -Check echocardiogram  Transient hypotension: Acute.  Patient's blood pressures were noted to be as low as 106/30.  Suspect likely secondary to dehydration with patient noted to have elevated specific gravity.  During last hospitalization she had been discontinued off of losartan and hydrochlorothiazide and per discharge records sent home with amlodipine.  However, this was not reported to be being given at home. -Bolus 500 mL normal saline IV fluids -Goal MAP greater than 65  SIRS: On admission patient was noted to be tachycardic and tachypneic with white blood cell count elevated 20.3.  Urinalysis showed no signs of infection.  CT scan of the chest did not note any concern for any pneumonia.  Possibly  secondary to above -Follow-up repeat blood cultures -Repeat CBC tomorrow morning  Elevated troponin: Acute.  Patient presented with complaints of chest pain, but would suspect secondary to pulmonary embolus.  High-sensitivity troponin 155->270. -Follow-up echocardiogram  Recent Aerococcus urinae bacteremia: During last hospitalization blood culture grew Aerococcus urinae.  ID had been consulted.  Patient was initially been started on IV Rocephin, switched to IV ampicillin and vancomycin on 7/31, vancomycin discontinued on 8/1, ampicillin continued 8/3, and ultimately discharged home on amoxicillin to complete a 13-day course which is due to be completed today. -Continue amoxicillin x1 day  Dysuria: Prior to arrival.  UA negative for any signs of infection.  Patient was recommended to continue on hydrocortisone cream, but this was not done per family. -Resume hydrocortisone cream  Generalized weakness: Patient has been noted to be sedentary after recent hospitalization.  During last hospitalization SNF was recommended but was declined. -Would benefit from repeat PT and OT evaluations  Anemia of chronic disease: Hemoglobin 10.1 which appears near last check of 9.7 on 8/2. -Repeat CBC tomorrow morning  History of ITP: Resolved.  Thought secondary to severe infection.  Patient had received 4 units of FFP and 2 g of IVIG.  Platelet counts currently stable at 217.  Patient completed course of steroids couple days ago per daughter.  History of breast cancer: Patient followed by Dr. [name] in the outpatient setting.  Previously on Ibrance and Arimidex.  Arimidex was continued per oncology's recommendation, but Leslee Home had been discontinued in the setting of sepsis/infection. -Continue Arimidex -Dr. [name] notified of the patient's admission into the hospital  Osteoarthritis: Patient chronically has joint pains. -Tylenol as needed  Severe protein calorie malnutrition -Continue Remeron 15 mg  nightly -Ensure with meals  DVT prophylaxis: Heparin Code Status: Full Family Communication: Daughter, updated over the phone Disposition Plan: To be determined Consults called: None Admission status: Inpatient, require more than 2 midnight stay  Norval Morton MD [facility]   If 7PM-7AM, please contact night-coverage   09/24/2020, 7:15 AM

## 2020-09-29 NOTE — ED Notes (Signed)
Pt back from [name] 0921

## 2020-09-29 NOTE — ED Triage Notes (Signed)
Per EMS, pt from home w/ daughter, c/o "pain all over."  She has not been getting her pain medication from her caregiver per EMS.  In triage pt states she has left sided chest pain.  She continues to be treated for her UTI and states it does hurt to urinate. Emesis X2 today   HR 114 96%RA 140/54 CBG 181

## 2020-09-29 NOTE — ED Notes (Signed)
Pt to [facility] [name] 0840

## 2020-09-29 NOTE — ED Notes (Signed)
Pt pulled up in bed and pillows placed underneath butt to get pressure off coccyx.

## 2020-09-30 ENCOUNTER — Other Ambulatory Visit (HOSPITAL_COMMUNITY): Payer: Self-pay

## 2020-09-30 ENCOUNTER — Encounter (HOSPITAL_COMMUNITY): Payer: Self-pay | Admitting: Internal Medicine

## 2020-09-30 ENCOUNTER — Other Ambulatory Visit: Payer: Self-pay | Admitting: Oncology

## 2020-09-30 DIAGNOSIS — I2699 Other pulmonary embolism without acute cor pulmonale: Secondary | ICD-10-CM | POA: Diagnosis not present

## 2020-09-30 LAB — CBC
HCT: 26.9 % — ABNORMAL LOW (ref 36.0–46.0)
Hemoglobin: 8.8 g/dL — ABNORMAL LOW (ref 12.0–15.0)
MCH: 30.6 pg (ref 26.0–34.0)
MCHC: 32.7 g/dL (ref 30.0–36.0)
MCV: 93.4 fL (ref 80.0–100.0)
Platelets: 206 10*3/uL (ref 150–400)
RBC: 2.88 MIL/uL — ABNORMAL LOW (ref 3.87–5.11)
RDW: 17.5 % — ABNORMAL HIGH (ref 11.5–15.5)
WBC: 19 10*3/uL — ABNORMAL HIGH (ref 4.0–10.5)
nRBC: 0 % (ref 0.0–0.2)

## 2020-09-30 LAB — HEPARIN LEVEL (UNFRACTIONATED): Heparin Unfractionated: 0.55 IU/mL (ref 0.30–0.70)

## 2020-09-30 MED ORDER — NITROGLYCERIN 0.4 MG SL SUBL
SUBLINGUAL_TABLET | SUBLINGUAL | Status: AC
  Filled 2020-09-30: qty 1

## 2020-09-30 MED ORDER — MORPHINE SULFATE (PF) 2 MG/ML IV SOLN
2.0000 mg | INTRAVENOUS | Status: DC | PRN
  Administered 2020-09-30 – 2020-10-06 (×13): 2 mg via INTRAVENOUS
  Filled 2020-09-30 (×13): qty 1

## 2020-09-30 MED ORDER — SODIUM CHLORIDE 0.9 % IV SOLN: INTRAVENOUS | Status: DC

## 2020-09-30 MED ORDER — APIXABAN 5 MG PO TABS
10.0000 mg | ORAL_TABLET | Freq: Two times a day (BID) | ORAL | Status: DC
  Administered 2020-09-30 (×2): 10 mg via ORAL
  Filled 2020-09-30 (×2): qty 2

## 2020-09-30 MED ORDER — NITROGLYCERIN 0.4 MG/SPRAY TL SOLN
1.0000 | Status: DC | PRN
  Filled 2020-09-30: qty 4.9

## 2020-09-30 MED ORDER — PREDNISONE 20 MG PO TABS
40.0000 mg | ORAL_TABLET | Freq: Every day | ORAL | Status: DC
  Administered 2020-09-30 – 2020-10-04 (×4): 40 mg via ORAL
  Filled 2020-09-30 (×4): qty 2

## 2020-09-30 MED ORDER — MORPHINE SULFATE (PF) 2 MG/ML IV SOLN
2.0000 mg | INTRAVENOUS | Status: DC | PRN
Start: 2020-09-30 — End: 2020-09-30
  Administered 2020-09-30 (×2): 2 mg via INTRAVENOUS
  Filled 2020-09-30 (×2): qty 1

## 2020-09-30 MED ORDER — SENNOSIDES-DOCUSATE SODIUM 8.6-50 MG PO TABS
1.0000 | ORAL_TABLET | Freq: Every day | ORAL | Status: DC
  Administered 2020-09-30 – 2020-10-03 (×4): 1 via ORAL
  Filled 2020-09-30 (×4): qty 1

## 2020-09-30 MED ORDER — HYDROCODONE-ACETAMINOPHEN 5-325 MG PO TABS
1.0000 | ORAL_TABLET | Freq: Four times a day (QID) | ORAL | Status: DC | PRN
  Administered 2020-10-01 (×2): 1 via ORAL
  Filled 2020-09-30 (×3): qty 1

## 2020-09-30 MED ORDER — NITROGLYCERIN 0.4 MG SL SUBL
0.4000 mg | SUBLINGUAL_TABLET | SUBLINGUAL | Status: DC | PRN
  Administered 2020-09-30 – 2020-10-03 (×6): 0.4 mg via SUBLINGUAL
  Filled 2020-09-30 (×3): qty 1

## 2020-09-30 MED ORDER — PREDNISONE 20 MG PO TABS: 40.0000 mg | ORAL_TABLET | Freq: Every day | ORAL | 4 refills | Status: AC

## 2020-09-30 MED ORDER — APIXABAN 5 MG PO TABS: 5.0000 mg | ORAL_TABLET | Freq: Two times a day (BID) | ORAL | Status: DC

## 2020-09-30 NOTE — Discharge Instructions (Addendum)
Information on my medicine - ELIQUIS (apixaban)  Why was Eliquis prescribed for you? Eliquis was prescribed to treat blood clots that may have been found in the veins of your legs (deep vein thrombosis) or in your lungs (pulmonary embolism) and to reduce the risk of them occurring again.  What do You need to know about Eliquis ? The starting dose is 10 mg (two 5 mg tablets) taken TWICE daily for the FIRST SEVEN (7) DAYS, then on 10/09/2020  the dose is reduced to ONE 5 mg tablet taken TWICE daily.  Eliquis may be taken with or without food.   Try to take the dose about the same time in the morning and in the evening. If you have difficulty swallowing the tablet whole please discuss with your pharmacist how to take the medication safely.  Take Eliquis exactly as prescribed and DO NOT stop taking Eliquis without talking to the doctor who prescribed the medication.  Stopping may increase your risk of developing a new blood clot.  Refill your prescription before you run out.  After discharge, you should have regular check-up appointments with your healthcare provider that is prescribing your Eliquis.    What do you do if you miss a dose? If a dose of ELIQUIS is not taken at the scheduled time, take it as soon as possible on the same day and twice-daily administration should be resumed. The dose should not be doubled to make up for a missed dose.  Important Safety Information A possible side effect of Eliquis is bleeding. You should call your healthcare provider right away if you experience any of the following: Bleeding from an injury or your nose that does not stop. Unusual colored urine (red or dark brown) or unusual colored stools (red or black). Unusual bruising for unknown reasons. A serious fall or if you hit your head (even if there is no bleeding).  Some medicines may interact with Eliquis and might increase your risk of bleeding or clotting while on Eliquis. To help avoid  this, consult your healthcare provider or pharmacist prior to using any new prescription or non-prescription medications, including herbals, vitamins, non-steroidal anti-inflammatory drugs (NSAIDs) and supplements.  This website has more information on Eliquis (apixaban): http://www.eliquis.com/eliquis/home

## 2020-09-30 NOTE — Progress Notes (Signed)
ANTICOAGULATION CONSULT NOTE   Pharmacy Consult for Heparin  Indication: pulmonary embolus, rule out ACS  No Known Allergies  Patient Measurements: ~50 kg  Vital Signs: Temp: 98.7 F (37.1 C) (08/17 1040) Temp Source: Oral (08/17 1040) BP: 128/51 (08/17 1040)  Labs: Recent Labs    10/13/2020 0120 10/09/2020 0324 10/08/2020 0510 10/04/2020 1113 09/20/2020 1620 09/30/20 0028  HGB 10.1*  --   --   --   --  8.8*  HCT 31.3*  --   --   --   --  26.9*  PLT 217  --   --   --   --  206  HEPARINUNFRC  --   --   --   --  0.49 0.55  CREATININE 0.80  --   --   --   --   --   TROPONINIHS 155* 270* 317* 576*  --   --      Estimated Creatinine Clearance: 36.1 mL/min (by C-G formula based on SCr of 0.8 mg/dL).   Medical History: Past Medical History:  Diagnosis Date   Arthritis    Cancer ([place])    Bilaterial Breast Cancer   Dyspnea    GERD (gastroesophageal reflux disease)    Hypertension    Stroke [facility])    "mini stroke" per patient     Assessment: 87 y/o F with history of breast cancer, found to have small PE and elevated troponin. Requested to start heparin per pharmacy. She is recovering from an episode on ITP with a nice platelet recovery up to 217. Hgb 10.1 Renal function good. No anti-coagulation PTA.   Hgb 8.8, plt 206. Has been therapeutic on heparin infusion - plan to transition to apixaban. No s/sx of bleeding or infusion issues per nursing.   Goal of Therapy:  Heparin level 0.3-0.7 units/ml Monitor platelets by anticoagulation protocol: Yes   Plan:  Discontinue heparin infusion  Apixaban 10 mg BID for 1 week then 5 mg BID  Daily CBC Monitor for bleeding  Antonietta Jewel, PharmD, [place] Pharmacist  Phone: [phone] 09/30/2020 10:55 AM  Please check AMION for all [place] phone numbers After 10:00 PM, call [place] [phone]

## 2020-09-30 NOTE — Progress Notes (Addendum)
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  She missed her f/u appointment with Michelle [name] after her discharge and when we called to speak with her daughter Michelle [name], she noted that Michelle [name] was regaining her strength.  She was going to have home health draw her labs since they were visiting her, however she developed chest pain and ended up in the ER yesterday and was diagnosed with a pulmonary embolus.    She continues on Anastrozole daily and was started on Eliquis 13m BID this morning.    BYahayrawas sitting up in the chair with her duaghter NRonny Baconat her side.  NRonny Baconnotes that she has been caring for her mom 24/7 since her recent hospital discharge.  BTaquishastarted on the Eliquis and NRonny Bacontells me that BCharlettemay get to go home tomorrow morning.  BKoryis feeling much better than she did a couple of days ago when she experienced the PE.    Michelle [name]'s platelet count has been normal since her admission.  She was previously on Prednisone 448mprior to admission.  She has not received it since she has been hospitalized.  Her family ran out of the medication at home, and she stopped at some point last week.  Objective:  Vitals:   09/30/20 0705 09/30/20 1040  BP: (!) 100/37 (!) 128/51  Pulse:    Resp: 19 (!) 23  Temp: 98.7 F (37.1 C) 98.7 F (37.1 C)  SpO2: 92% 95%    Body mass index is 19.62 kg/m.  Intake/Output Summary (Last 24 hours) at 09/30/2020 1249 Last data filed at 09/30/2020 1040 Gross per 24 hour  Intake 2167.74 ml  Output 300 ml  Net 1867.74 ml     Sclerae unicteric  Oropharynx shows no thrush or other lesions  No cervical or supraclavicular adenopathy  Lungs no rales or wheezes--auscultated anterolaterally  Heart regular rate and rhythm  Abdomen soft, +BS  Neuro nonfocal    CBG (last 3)  No results for input(s): GLUCAP in the last 72  hours.   Labs:  Lab Results  Component Value Date   WBC 19.0 (H) 09/30/2020   HGB 8.8 (L) 09/30/2020   HCT 26.9 (L) 09/30/2020   MCV 93.4 09/30/2020   PLT 206 09/30/2020   NEUTROABS 18.0 (H) 10/12/2020    '@LASTCHEMISTRY' @  Urine Studies No results for input(s): UHGB, CRYS in the last 72 hours.  Invalid input(s): UACOL, UAPR, USPG, UPH, UTP, UGL, UKET, UBIL, UNIT, UROB, ULEU, UEPI, UWBC, URBC, UBAC, CAST, UCOM, BILUA  Basic Metabolic Panel: Recent Labs  Lab 09/25/2020 0120  NA 132*  K 4.4  CL 96*  CO2 26  GLUCOSE 187*  BUN 17  CREATININE 0.80  CALCIUM 8.8*   GFR Estimated Creatinine Clearance: 36.1 mL/min (by C-G formula based on SCr of 0.8 mg/dL). Liver Function Tests: Recent Labs  Lab 09/15/2020 0120  AST 46*  ALT 61*  ALKPHOS 78  BILITOT 0.8  PROT 6.3*  ALBUMIN 2.5*   No results for input(s): LIPASE, AMYLASE in the last 168 hours. No results for input(s): AMMONIA in the last 168 hours. Coagulation profile No results for input(s): INR, PROTIME in the last 168 hours.  CBC: Recent Labs  Lab 09/22/2020 0120 09/30/20 0028  WBC 20.3* 19.0*  NEUTROABS 18.0*  --   HGB 10.1* 8.8*  HCT 31.3* 26.9*  MCV  94.0 93.4  PLT 217 206   Cardiac Enzymes: No results for input(s): CKTOTAL, CKMB, CKMBINDEX, TROPONINI in the last 168 hours. BNP: Invalid input(s): POCBNP CBG: No results for input(s): GLUCAP in the last 168 hours. D-Dimer No results for input(s): DDIMER in the last 72 hours. Hgb A1c No results for input(s): HGBA1C in the last 72 hours. Lipid Profile No results for input(s): CHOL, HDL, LDLCALC, TRIG, CHOLHDL, LDLDIRECT in the last 72 hours. Thyroid function studies No results for input(s): TSH, T4TOTAL, T3FREE, THYROIDAB in the last 72 hours.  Invalid input(s): FREET3 Anemia work up No results for input(s): VITAMINB12, FOLATE, FERRITIN, TIBC, IRON, RETICCTPCT in the last 72 hours. Microbiology Recent Results (from the past 240 hour(s))  Blood  culture (routine x 2)     Status: None (Preliminary result)   Collection Time: 09/24/2020  3:24 AM   Specimen: BLOOD  Result Value Ref Range Status   Specimen Description BLOOD RIGHT ANTECUBITAL  Final   Special Requests   Final    BOTTLES DRAWN AEROBIC AND ANAEROBIC Blood Culture adequate volume   Culture   Final    NO GROWTH 1 DAY Performed at [facility], [address], [place], [place] [address]    Report Status PENDING  Incomplete  Resp Panel by RT-PCR (Flu A&B, Covid) Nasopharyngeal Swab     Status: None   Collection Time: 09/19/2020  3:29 AM   Specimen: Nasopharyngeal Swab; Nasopharyngeal(NP) swabs in vial transport medium  Result Value Ref Range Status   SARS Coronavirus 2 by RT PCR NEGATIVE NEGATIVE Final    Comment: (NOTE) SARS-CoV-2 target nucleic acids are NOT DETECTED.  The SARS-CoV-2 RNA is generally detectable in upper respiratory specimens during the acute phase of infection. The lowest concentration of SARS-CoV-2 viral copies this assay can detect is 138 copies/mL. A negative result does not preclude SARS-Cov-2 infection and should not be used as the sole basis for treatment or other patient management decisions. A negative result may occur with  improper specimen collection/handling, submission of specimen other than nasopharyngeal swab, presence of viral mutation(s) within the areas targeted by this assay, and inadequate number of viral copies(<138 copies/mL). A negative result must be combined with clinical observations, patient history, and epidemiological information. The expected result is Negative.  Fact Sheet for Patients:  EntrepreneurPulse.com.au  Fact Sheet for Healthcare Providers:  IncredibleEmployment.be  This test is no t yet approved or cleared by the [place] FDA and  has been authorized for detection and/or diagnosis of SARS-CoV-2 by FDA under an Emergency Use Authorization (EUA). This EUA will  remain  in effect (meaning this test can be used) for the duration of the COVID-19 declaration under Section 564(b)(1) of the Act, 21 U.S.C.section 360bbb-3(b)(1), unless the authorization is terminated  or revoked sooner.       Influenza A by PCR NEGATIVE NEGATIVE Final   Influenza B by PCR NEGATIVE NEGATIVE Final    Comment: (NOTE) The Xpert Xpress SARS-CoV-2/FLU/RSV plus assay is intended as an aid in the diagnosis of influenza from Nasopharyngeal swab specimens and should not be used as a sole basis for treatment. Nasal washings and aspirates are unacceptable for Xpert Xpress SARS-CoV-2/FLU/RSV testing.  Fact Sheet for Patients: EntrepreneurPulse.com.au  Fact Sheet for Healthcare Providers: IncredibleEmployment.be  This test is not yet approved or cleared by the [place] FDA and has been authorized for detection and/or diagnosis of SARS-CoV-2 by FDA under an Emergency Use Authorization (EUA). This EUA will remain in effect (  meaning this test can be used) for the duration of the COVID-19 declaration under Section 564(b)(1) of the Act, 21 U.S.C. section 360bbb-3(b)(1), unless the authorization is terminated or revoked.  Performed at [facility], [place] [address], [place], [place] [address]   Blood culture (routine x 2)     Status: None (Preliminary result)   Collection Time: 10/12/2020  3:40 AM   Specimen: BLOOD RIGHT FOREARM  Result Value Ref Range Status   Specimen Description BLOOD RIGHT FOREARM  Final   Special Requests   Final    BOTTLES DRAWN AEROBIC AND ANAEROBIC Blood Culture adequate volume   Culture   Final    NO GROWTH 1 DAY Performed at [facility], [place] [address], [place], [place] [address]    Report Status PENDING  Incomplete  Urine Culture     Status: Abnormal (Preliminary result)   Collection Time: 09/15/2020  5:16 AM   Specimen: Urine, Clean Catch  Result Value Ref Range Status   Specimen Description  URINE, CLEAN CATCH  Final   Special Requests NONE  Final   Culture (A)  Final    50,000 COLONIES/mL PSEUDOMONAS AERUGINOSA SUSCEPTIBILITIES TO FOLLOW Performed at [facility], [place] [address] [place], [place] [address]    Report Status PENDING  Incomplete      Studies:  DG Chest 2 View  Result Date: 09/18/2020 CLINICAL DATA:  Chest pain EXAM: CHEST - 2 VIEW COMPARISON:  09/07/2020 FINDINGS: Cardiac shadow is stable. Aortic calcifications are again seen. The lungs are well aerated bilaterally. No focal infiltrate is seen. Mild blunting of the right costophrenic angle posteriorly is noted. No bony abnormality is noted. IMPRESSION: Likely small right pleural effusion.  No focal infiltrate is noted. Electronically Signed   By: Inez Catalina M.D.   On: 10/13/2020 01:26   CT Angio Chest PE W and/or Wo Contrast  Result Date: 09/26/2020 CLINICAL DATA:  High probability for pulmonary embolism EXAM: CT ANGIOGRAPHY CHEST WITH CONTRAST TECHNIQUE: Multidetector CT imaging of the chest was performed using the standard protocol during bolus administration of intravenous contrast. Multiplanar CT image reconstructions and MIPs were obtained to evaluate the vascular anatomy. CONTRAST:  8m OMNIPAQUE IOHEXOL 350 MG/ML SOLN COMPARISON:  06/07/2019 FINDINGS: Cardiovascular: Single subsegmental pulmonary artery filling defect with acute features in the right upper lobe as marked on series 7. Cardiomegaly. Small volume pericardial effusion. Aortic atherosclerosis. No acute aortic finding. Mediastinum/Nodes: Negative for adenopathy or mass. Left breast mass on prior which is not seen today. Lungs/Pleura: Dependent atelectasis that is mild. There is no edema, consolidation, effusion, or pneumothorax. Upper Abdomen: No acute finding Musculoskeletal: Diffuse spinal degeneration with scoliosis. Glenohumeral osteoarthritis bilaterally. Review of the MIP images confirms the above findings. Critical Value/emergent  results were called by telephone at the time of interpretation on 09/17/2020 at 5:07 am to [facility], who verbally acknowledged these results. IMPRESSION: Single subsegmental pulmonary embolism to the right upper lobe. Electronically Signed   By: JMonte FantasiaM.D.   On: 10/07/2020 05:07   VAS [place]OWER EXTREMITY VENOUS (DVT)  Result Date: 10/03/2020  Lower Venous DVT Study Patient Name:  BCORBY VILLASENOR Date of Exam:   09/30/2020 Medical Rec #: [id number]      Accession #:    [id number] of Birth: 7Feb 12, 1932       Patient Gender: F Patient Age:   89years [facility]  [facility] Michelle [name] LOWER EXTREMITY VENOUS (DVT) Referring Phys: Fuller Plan --------------------------------------------------------------------------------  Indications: Pulmonary embolism.  Comparison Study: no prior Performing Technologist: Archie Patten RVS  Examination Guidelines: A complete evaluation includes B-mode imaging, spectral Doppler, color Doppler, and power Doppler as needed of all accessible portions of each vessel. Bilateral testing is considered an integral part of a complete examination. Limited examinations for reoccurring indications may be performed as noted. The reflux portion of the exam is performed with the patient in reverse Trendelenburg.  +---------+---------------+---------+-----------+----------+--------------+ RIGHT    CompressibilityPhasicitySpontaneityPropertiesThrombus Aging +---------+---------------+---------+-----------+----------+--------------+ CFV      Full           Yes      Yes                                 +---------+---------------+---------+-----------+----------+--------------+ SFJ      Full                                                        +---------+---------------+---------+-----------+----------+--------------+ FV Prox  Full                                                         +---------+---------------+---------+-----------+----------+--------------+ FV Mid   Full                                                        +---------+---------------+---------+-----------+----------+--------------+ FV DistalFull                                                        +---------+---------------+---------+-----------+----------+--------------+ PFV      Full                                                        +---------+---------------+---------+-----------+----------+--------------+ POP      Full           Yes      Yes                                 +---------+---------------+---------+-----------+----------+--------------+ PTV      Full                                                        +---------+---------------+---------+-----------+----------+--------------+ PERO     Full                                                        +---------+---------------+---------+-----------+----------+--------------+   +---------+---------------+---------+-----------+----------+--------------+  LEFT     CompressibilityPhasicitySpontaneityPropertiesThrombus Aging +---------+---------------+---------+-----------+----------+--------------+ CFV      Full           Yes      Yes                                 +---------+---------------+---------+-----------+----------+--------------+ SFJ      Full                                                        +---------+---------------+---------+-----------+----------+--------------+ FV Prox  Full                                                        +---------+---------------+---------+-----------+----------+--------------+ FV Mid   Full                                                        +---------+---------------+---------+-----------+----------+--------------+ FV DistalFull                                                         +---------+---------------+---------+-----------+----------+--------------+ PFV      Full                                                        +---------+---------------+---------+-----------+----------+--------------+ POP      Full           Yes      Yes                                 +---------+---------------+---------+-----------+----------+--------------+ PTV      Full                                                        +---------+---------------+---------+-----------+----------+--------------+ PERO     Full                                                        +---------+---------------+---------+-----------+----------+--------------+     Summary: BILATERAL: - No evidence of deep vein thrombosis seen in the lower extremities, bilaterally. -No evidence of popliteal cyst, bilaterally.   *See table(s) above for measurements and observations. Electronically signed by Deitra Mayo MD on 09/19/2020 at 3:29:08 PM.  Final    ECHOCARDIOGRAM LIMITED  Result Date: 09/18/2020    ECHOCARDIOGRAM LIMITED REPORT   Patient Name:   ADIRA LIMBURG Date of Exam: 10/12/2020 Medical Rec #:  [id number]      Height:       61.0 in Accession #:    [id number]     Weight:       103.8 lb Date of Birth:  [birth year]       BSA:          1.430 m Patient Age:    19 years       BP:           112/48 mmHg Patient Gender: F              HR:           94 bpm. Exam Location:  Inpatient Procedure: Limited Echo, Cardiac Doppler and Color Doppler Indications:    Pulmonary Embolus I26.09  History:        Patient has prior history of Echocardiogram examinations, most                 recent 09/14/2020. Stroke, Signs/Symptoms:Dyspnea; Risk                 Factors:Hypertension.  Sonographer:    Vickie Epley RDCS Referring Phys: [id number] RONDELL A SMITH IMPRESSIONS  1. Left ventricular ejection fraction, by estimation, is 60 to 65%. The left ventricle has normal function. The left ventricle has no regional wall motion  abnormalities. Left ventricular diastolic parameters were normal.  2. Right ventricular systolic function is normal. The right ventricular size is normal.  3. Left atrial size was mildly dilated.  4. A small pericardial effusion is present. The pericardial effusion is posterior to the left ventricle and lateral to the left ventricle.  5. The mitral valve is degenerative. Mild mitral valve regurgitation. No evidence of mitral stenosis. Moderate mitral annular calcification.  6. Gradients have increased from 09/14/20 mean 16.7 -> 24 mmHg peak 27 -> 38.6 mmHg' Valve area's do not correlate as previous LvOT diameter used much smaller at 1.1 cm . The aortic valve is abnormal. There is moderate calcification of the aortic valve. There is moderate thickening of the aortic valve. Aortic valve regurgitation is mild. Moderate aortic valve stenosis.  7. The inferior vena cava is normal in size with greater than 50% respiratory variability, suggesting right atrial pressure of 3 mmHg. FINDINGS  Left Ventricle: Left ventricular ejection fraction, by estimation, is 60 to 65%. The left ventricle has normal function. The left ventricle has no regional wall motion abnormalities. The left ventricular internal cavity size was normal in size. There is  no left ventricular hypertrophy. Left ventricular diastolic parameters were normal. Right Ventricle: The right ventricular size is normal. No increase in right ventricular wall thickness. Right ventricular systolic function is normal. Left Atrium: Left atrial size was mildly dilated. Right Atrium: Right atrial size was normal in size. Pericardium: A small pericardial effusion is present. The pericardial effusion is posterior to the left ventricle and lateral to the left ventricle. Mitral Valve: The mitral valve is degenerative in appearance. There is mild thickening of the mitral valve leaflet(s). There is mild calcification of the mitral valve leaflet(s). Moderate mitral annular  calcification. Mild mitral valve regurgitation. No evidence of mitral valve stenosis. MV peak gradient, 8.0 mmHg. The mean mitral valve gradient is 4.0 mmHg. Tricuspid Valve: The tricuspid valve is normal in structure. Tricuspid valve regurgitation is not  demonstrated. No evidence of tricuspid stenosis. Aortic Valve: Gradients have increased from 09/14/20 mean 16.7 -> 24 mmHg peak 27 -> 38.6 mmHg' Valve area's do not correlate as previous LvOT diameter used much smaller at 1.1 cm. The aortic valve is abnormal. There is moderate calcification of the aortic  valve. There is moderate thickening of the aortic valve. Aortic valve regurgitation is mild. Moderate aortic stenosis is present. Aortic valve mean gradient measures 24.0 mmHg. Aortic valve peak gradient measures 38.6 mmHg. Aortic valve area, by VTI measures 1.01 cm. Pulmonic Valve: The pulmonic valve was normal in structure. Pulmonic valve regurgitation is not visualized. No evidence of pulmonic stenosis. Aorta: The aortic root is normal in size and structure. Venous: The inferior vena cava is normal in size with greater than 50% respiratory variability, suggesting right atrial pressure of 3 mmHg. IAS/Shunts: No atrial level shunt detected by color flow Doppler. LEFT VENTRICLE PLAX 2D LVIDd:         4.50 cm     Diastology LVIDs:         3.30 cm     LV e' medial:    8.27 cm/s LV PW:         0.90 cm     LV E/e' medial:  14.1 LV IVS:        0.90 cm     LV e' lateral:   9.14 cm/s LVOT diam:     1.80 cm     LV E/e' lateral: 12.8 LV SV:         67 LV SV Index:   47 LVOT Area:     2.54 cm  LV Volumes (MOD) LV vol d, MOD A2C: 75.3 ml LV vol d, MOD A4C: 99.4 ml LV vol s, MOD A2C: 27.9 ml LV vol s, MOD A4C: 52.1 ml LV SV MOD A2C:     47.4 ml LV SV MOD A4C:     99.4 ml LV SV MOD BP:      47.7 ml LEFT ATRIUM         Index LA diam:    4.00 cm 2.80 cm/m  AORTIC VALVE AV Area (Vmax):    1.07 cm AV Area (Vmean):   0.98 cm AV Area (VTI):     1.01 cm AV Vmax:           310.50  cm/s AV Vmean:          236.000 cm/s AV VTI:            0.658 m AV Peak Grad:      38.6 mmHg AV Mean Grad:      24.0 mmHg LVOT Vmax:         131.00 cm/s LVOT Vmean:        91.200 cm/s LVOT VTI:          0.262 m LVOT/AV VTI ratio: 0.40  AORTA Ao Root diam: 2.40 cm MITRAL VALVE MV Area (PHT): 6.07 cm     SHUNTS MV Area VTI:   2.29 cm     Systemic VTI:  0.26 m MV Peak grad:  8.0 mmHg     Systemic Diam: 1.80 cm MV Mean grad:  4.0 mmHg MV Vmax:       1.41 m/s MV Vmean:      90.5 cm/s MV Decel Time: 125 msec MR Peak grad: 102.4 mmHg MR Vmax:      506.00 cm/s MV E velocity: 117.00 cm/s MV A velocity: 86.30 cm/s MV E/A ratio:  1.36 Jenkins Rouge MD Electronically signed by Jenkins Rouge MD Signature Date/Time: 10/06/2020/9:30:24 AM    Final     Assessment: 87 y.o. [place] woman status post bilateral breast biopsies on 05/17/2019 as follows:             (a) on the left upper outer quadrant, a clinical T2 N2, stage IIIA invasive ductal carcinoma,grade 2 or 3, estrogen receptor positive, progesterone receptor negative, with no HER-2 amplification and an MIB-1 of 12%             (b) on the right overlapping sites, 2 morphologically distinct invasive ductal carcinomas, both clinically T1c N0, stage IA, grade 1 or 2, estrogen receptor positive, progesterone receptor negative, with no HER-2 amplification and an MIB-1 of 5%             (c) CT of the chest abdomen and pelvis 06/10/2019 shows left axillary, retropectoral and supraclavicular lymph nodes, and a 4.1 cm left breast mass; no right axillary adenopathy; 0.6 cm nonspecific left lower lobe pulmonary nodule; no liver or bone lesions.             (d) bone scan 06/26/2019--no evidence of skeletal metastases                          (1) neoadjuvant anastrozole started 05/28/2019, palbociclib added 05/30/2019 125 mg a day, 21 on 7 off             (a) palbociclib dose decreased cycle 2 (May 2021) to 100 mg daily, 21/7             (b) bilateral breast and axilla  ultrasonography 10/04/2019 shows a good initial response to treatment             (c) repeat bilateral ultrasonography 05/20/2020 continues to show evidence of response   (2) genetics testing declined   (3) definitive surgery to follow   (4) adjuvant radiation to follow   (5) immune thrombocytopenia (ITP)             (A) ADAMTS 13 was 63.0 on 09/09/2020             (B) status post IVIG 09/10/2020 and 09/11/2020             (C) prednisone 40 mg/ daily started 09/12/2018 stopped week of   (6) Pulmonary embolus noted on CTA on 09/18/2020  (A) Eliquis 35m BID began on 09/30/2020   Plan:   BTaniais currently admitted due to PE and is under treatment with Eliquis and tolerating it well thus far.  I reviewed with NRonny Baconand BTakieshathat risk factors for blood clots include periods of immobility and the cancer in her breast.  She will continue on Eliquis.    We talked about BGilbertaworking on regaining her strength, evaluating her surgical candidacy, and then possibly proceeding with surgery.    In regards to Jenavi's ITP, she still is potentially in the protective range of having received the IVIG at the end of July.  Her platelet count is normal today.   If being discharged tomorrow, we will need to see her back next week in clinic for follow-up to get a CBC and evaluate her tolerance of Eliquis.    BJaziyahwill continue to work on her physical strength.  The primary hospitalist team is managing her other medical problems, and we very much appreciate their great care of our mutual patient.    We  will continue to follow along.      Scot Dock, NP 09/30/2020  12:49 PM Medical Oncology and Hematology [facility] [address] [place], [place] [address] Tel. [phone]    Fax. [phone]   ADDENDUM: Janay is an 87 y/o [place] woman I follow for early-stage breast cacner. Her family felt she was too old for lumpectomy so her cancer is still "in place," but remains  well-controlled as of the most recent mammograms. This may have contributed minimally to her clotting risk, with inactivity/ being relatively bed-bound more likely as immediate cause.  We now have data to treat coagulopathic cancer patients with NOACs and the plan to discharge the patient on apixaban/Eliquis is a good one. The caveat is her recent diagnosis of ITP.   This was a very atypical ITP case, with intercurrent sepsis. Natalynn received IVIG 3 weeks ago, then received steroids until the medication "ran out" at home. She is still in the therapeutic window for IVIG. It is possible her thrombocytopenia will not recur, but she may also develop a platelet count of <10K w/o any warning symptoms. Severe thrombocytopenia with apixaban on board can be dangerous. Accordingly we will be following her platelet count closely the next few weeks and restart steroids at the same dose as before (40 mg prednisione QAM) starting 10/02/2019  Greatly appreciate your help to this patient and her family.  I personally saw this patient and performed a substantive portion of this encounter with the listed APP documented above.   Chauncey Cruel, MD Medical Oncology and Hematology [facility] [address] [place], [place] [address] Tel. [phone]    Fax. [phone]

## 2020-09-30 NOTE — Progress Notes (Signed)
ANTICOAGULATION CONSULT NOTE   Pharmacy Consult for Heparin  Indication: pulmonary embolus, rule out ACS  No Known Allergies  Patient Measurements: ~50 kg  Vital Signs: Temp: 98.8 F (37.1 C) (08/16 2000) Temp Source: Oral (08/16 2000) BP: 102/37 (08/16 2000) Pulse Rate: 108 (08/16 2000)  Labs: Recent Labs    09/27/2020 0120 09/25/2020 0324 10/14/2020 0510 10/02/2020 1113 09/24/2020 1620 09/30/20 0028  HGB 10.1*  --   --   --   --  8.8*  HCT 31.3*  --   --   --   --  26.9*  PLT 217  --   --   --   --  206  HEPARINUNFRC  --   --   --   --  0.49 0.55  CREATININE 0.80  --   --   --   --   --   TROPONINIHS 155* 270* 317* 576*  --   --      Estimated Creatinine Clearance: 36.1 mL/min (by C-G formula based on SCr of 0.8 mg/dL).   Medical History: Past Medical History:  Diagnosis Date   Arthritis    Cancer ([place])    Bilaterial Breast Cancer   Dyspnea    GERD (gastroesophageal reflux disease)    Hypertension    Stroke [facility])    "mini stroke" per patient     Assessment: 87 y/o F with history of breast cancer, found to have small PE and elevated troponin. Requested to start heparin per pharmacy. She is recovering from an episode on ITP with a nice platelet recovery up to 217. Hgb 10.1 Renal function good. No anti-coagulation PTA.   8/17 AM update:  Heparin level therapeutic x 2  Goal of Therapy:  Heparin level 0.3-0.7 units/ml Monitor platelets by anticoagulation protocol: Yes   Plan:  Cont heparin 700 units/hr Daily CBC/Heparin level Monitor for bleeding  Narda Bonds, PharmD, BCPS Clinical Pharmacist Phone: [phone]

## 2020-09-30 NOTE — Consult Note (Signed)
   [facility] CM Inpatient Consult   09/30/2020  [name] 12/01/1930 [id number]  [facility] [[facility]] Patient: Michelle [name]  Primary Care Provider:  [name], MD [facility], this provider office is listed for the [facility] follow up.  Patient screened for unplanned readmission less than 30 days.  Review of patient's medical record reveals patient is currently admitted with a pulmonary emboli on anticoags.  Plan:  Continue to follow progress and disposition to assess for post hospital care management needs.    For questions contact:   Natividad Brood, RN BSN [facility] Liaison  [phone] business mobile phone Toll free office [phone]  Fax number: [phone] [email or web address]

## 2020-09-30 NOTE — Plan of Care (Signed)

## 2020-09-30 NOTE — Progress Notes (Signed)
PROGRESS NOTE    Michelle [name]  [id number] DOB: [birth year] DOA: 09/16/2020 PCP: [name], MD    Brief Narrative:  87 year old female with history of hypertension, breast cancer on Ibrance, GERD presented with chest pain.  Recent hospitalization with sepsis, bacteremia and acute ITP.  Currently on amoxicillin.  At the emergency room, afebrile.  Tachycardic.  Blood pressure stable.  On room air.  WBC 20,000.  Troponins 115-270.  Chest x-ray with right pleural effusion.  CTA with single subsegmental pulmonary artery right upper lobe acute pulmonary embolism.  Started on heparin drip.   Assessment & Plan:   Principal Problem:   Acute pulmonary embolism without acute cor pulmonale (HCC) Active Problems:   Arthritis   Malignant neoplasm of overlapping sites of right breast in female, estrogen receptor positive (HCC)   Anemia   SIRS (systemic inflammatory response syndrome) (HCC)   Transient hypotension   Elevated troponin   Dysuria   History of ITP   History of bacteremia  Acute pulmonary embolism without cor pulmonale: Currently on heparin Change to Eliquis Echocardiogram with no right heart strain.  No regional wall motion abnormalities. Lower extremity duplex is negative for presence of any acute DVT.  SIRS: Probably due to acute PE.  No evidence of infection.  Elevated troponins: Due to pulmonary embolism.  Stable.  Echocardiogram with no regional wall motion abnormalities.  Recent Aerococcus bacteremia: Completed amoxicillin therapy.  Recent history of ITP: Stabilized.  History of breast cancer: Followed by oncology as outpatient.  On anastrozole.  Leukocytosis: Probably stress reaction.   DVT prophylaxis: heparin infusion  apixaban (ELIQUIS) tablet 10 mg  apixaban (ELIQUIS) tablet 5 mg   Code Status: DNR Family Communication: daughter on the phone  Disposition Plan: Status is: Inpatient  Remains inpatient appropriate because:Inpatient level of care  appropriate due to severity of illness  Dispo: The patient is from: Home              Anticipated d/c is to: Home              Patient currently is not medically stable to d/c.   Difficult to place patient No         Consultants:  Oncology  Procedures:  None  Antimicrobials:  None   Subjective: Patient seen and examined.  No overnight events.  She does have back pain which is a long-term issue.  She tells me that she always sleeps on her side and back hurts.  Denies any shortness of breath.  She does have some pleuritic chest pain on deep breathing.  Denies any nausea or vomiting.  Not mobilized yet. Discussed with her about changing from heparin to Eliquis however she deferred this to her daughter and we discussed about going on oral anticoagulation. She will be mobilized today.  Anticipate go home and resume her home health physical therapy tomorrow.  Objective: Vitals:   09/30/20 0200 09/30/20 0300 09/30/20 0705 09/30/20 1040  BP: (!) 128/39 (!) 105/41 (!) 100/37 (!) 128/51  Pulse:   (!) 102   Resp: '18 18 19 '$ (!) 23  Temp:   98.7 F (37.1 C) 98.7 F (37.1 C)  TempSrc:   Oral Oral  SpO2:   92% 95%  Weight:      Height:        Intake/Output Summary (Last 24 hours) at 09/30/2020 1409 Last data filed at 09/30/2020 1040 Gross per 24 hour  Intake 2167.74 ml  Output 300 ml  Net 1867.74 ml  Filed Weights   09/14/2020 0700  Weight: 47.1 kg    Examination:  General exam: Appears calm and comfortable  Frail and debilitated.  Chronically sick looking.  Not in any distress.  On 1 L oxygen through the nasal cannula. Respiratory system: Clear to auscultation. Respiratory effort normal.  No added sounds. Cardiovascular system: S1 & S2 heard, RRR.  Gastrointestinal system: Abdomen is nondistended, soft and nontender. No organomegaly or masses felt. Normal bowel sounds heard. Central nervous system: Alert and oriented. No focal neurological deficits. Extremities: Symmetric  5 x 5 power. Skin: No rashes, lesions or ulcers Psychiatry: Judgement and insight appear normal.  Anxious.    Data Reviewed: I have personally reviewed following labs and imaging studies  CBC: Recent Labs  Lab 09/21/2020 0120 09/30/20 0028  WBC 20.3* 19.0*  NEUTROABS 18.0*  --   HGB 10.1* 8.8*  HCT 31.3* 26.9*  MCV 94.0 93.4  PLT 217 [id number]   Basic Metabolic Panel: Recent Labs  Lab 09/21/2020 0120  NA 132*  K 4.4  CL 96*  CO2 26  GLUCOSE 187*  BUN 17  CREATININE 0.80  CALCIUM 8.8*   GFR: Estimated Creatinine Clearance: 36.1 mL/min (by C-G formula based on SCr of 0.8 mg/dL). Liver Function Tests: Recent Labs  Lab 09/14/2020 0120  AST 46*  ALT 61*  ALKPHOS 78  BILITOT 0.8  PROT 6.3*  ALBUMIN 2.5*   No results for input(s): LIPASE, AMYLASE in the last 168 hours. No results for input(s): AMMONIA in the last 168 hours. Coagulation Profile: No results for input(s): INR, PROTIME in the last 168 hours. Cardiac Enzymes: No results for input(s): CKTOTAL, CKMB, CKMBINDEX, TROPONINI in the last 168 hours. BNP (last 3 results) No results for input(s): PROBNP in the last 8760 hours. HbA1C: No results for input(s): HGBA1C in the last 72 hours. CBG: No results for input(s): GLUCAP in the last 168 hours. Lipid Profile: No results for input(s): CHOL, HDL, LDLCALC, TRIG, CHOLHDL, LDLDIRECT in the last 72 hours. Thyroid Function Tests: No results for input(s): TSH, T4TOTAL, FREET4, T3FREE, THYROIDAB in the last 72 hours. Anemia Panel: No results for input(s): VITAMINB12, FOLATE, FERRITIN, TIBC, IRON, RETICCTPCT in the last 72 hours. Sepsis Labs: Recent Labs  Lab 10/07/2020 0324 10/05/2020 0510  LATICACIDVEN 1.5 1.9    Recent Results (from the past 240 hour(s))  Blood culture (routine x 2)     Status: None (Preliminary result)   Collection Time: 10/06/2020  3:24 AM   Specimen: BLOOD  Result Value Ref Range Status   Specimen Description BLOOD RIGHT ANTECUBITAL  Final    Special Requests   Final    BOTTLES DRAWN AEROBIC AND ANAEROBIC Blood Culture adequate volume   Culture   Final    NO GROWTH 1 DAY Performed at [facility], [place] [address] [place], [place] [address]    Report Status PENDING  Incomplete  Resp Panel by RT-PCR (Flu A&B, Covid) Nasopharyngeal Swab     Status: None   Collection Time: 09/27/2020  3:29 AM   Specimen: Nasopharyngeal Swab; Nasopharyngeal(NP) swabs in vial transport medium  Result Value Ref Range Status   SARS Coronavirus 2 by RT PCR NEGATIVE NEGATIVE Final    Comment: (NOTE) SARS-CoV-2 target nucleic acids are NOT DETECTED.  The SARS-CoV-2 RNA is generally detectable in upper respiratory specimens during the acute phase of infection. The lowest concentration of SARS-CoV-2 viral copies this assay can detect is 138 copies/mL. A negative result does not preclude SARS-Cov-2 infection  and should not be used as the sole basis for treatment or other patient management decisions. A negative result may occur with  improper specimen collection/handling, submission of specimen other than nasopharyngeal swab, presence of viral mutation(s) within the areas targeted by this assay, and inadequate number of viral copies(<138 copies/mL). A negative result must be combined with clinical observations, patient history, and epidemiological information. The expected result is Negative.  Fact Sheet for Patients:  EntrepreneurPulse.com.au  Fact Sheet for Healthcare Providers:  IncredibleEmployment.be  This test is no t yet approved or cleared by the [place] FDA and  has been authorized for detection and/or diagnosis of SARS-CoV-2 by FDA under an Emergency Use Authorization (EUA). This EUA will remain  in effect (meaning this test can be used) for the duration of the COVID-19 declaration under Section 564(b)(1) of the Act, 21 U.S.C.section 360bbb-3(b)(1), unless the authorization is terminated  or  revoked sooner.       Influenza A by PCR NEGATIVE NEGATIVE Final   Influenza B by PCR NEGATIVE NEGATIVE Final    Comment: (NOTE) The Xpert Xpress SARS-CoV-2/FLU/RSV plus assay is intended as an aid in the diagnosis of influenza from Nasopharyngeal swab specimens and should not be used as a sole basis for treatment. Nasal washings and aspirates are unacceptable for Xpert Xpress SARS-CoV-2/FLU/RSV testing.  Fact Sheet for Patients: EntrepreneurPulse.com.au  Fact Sheet for Healthcare Providers: IncredibleEmployment.be  This test is not yet approved or cleared by the [place] FDA and has been authorized for detection and/or diagnosis of SARS-CoV-2 by FDA under an Emergency Use Authorization (EUA). This EUA will remain in effect (meaning this test can be used) for the duration of the COVID-19 declaration under Section 564(b)(1) of the Act, 21 U.S.C. section 360bbb-3(b)(1), unless the authorization is terminated or revoked.  Performed at [facility], [place] [address], [place], [place] [address]   Blood culture (routine x 2)     Status: None (Preliminary result)   Collection Time: 10/06/2020  3:40 AM   Specimen: BLOOD RIGHT FOREARM  Result Value Ref Range Status   Specimen Description BLOOD RIGHT FOREARM  Final   Special Requests   Final    BOTTLES DRAWN AEROBIC AND ANAEROBIC Blood Culture adequate volume   Culture   Final    NO GROWTH 1 DAY Performed at [facility], [place] [address], [place], [place] [address]    Report Status PENDING  Incomplete  Urine Culture     Status: Abnormal (Preliminary result)   Collection Time: 09/24/2020  5:16 AM   Specimen: Urine, Clean Catch  Result Value Ref Range Status   Specimen Description URINE, CLEAN CATCH  Final   Special Requests NONE  Final   Culture (A)  Final    50,000 COLONIES/mL PSEUDOMONAS AERUGINOSA SUSCEPTIBILITIES TO FOLLOW Performed at [facility], [place] [address]  [place],  [address]    Report Status PENDING  Incomplete         Radiology Studies: DG Chest 2 View  Result Date: 10/02/2020 CLINICAL DATA:  Chest pain EXAM: CHEST - 2 VIEW COMPARISON:  09/07/2020 FINDINGS: Cardiac shadow is stable. Aortic calcifications are again seen. The lungs are well aerated bilaterally. No focal infiltrate is seen. Mild blunting of the right costophrenic angle posteriorly is noted. No bony abnormality is noted. IMPRESSION: Likely small right pleural effusion.  No focal infiltrate is noted. Electronically Signed   By: Inez Catalina M.D.   On: 10/05/2020 01:26   CT  Angio Chest PE W and/or Wo Contrast  Result Date: 10/08/2020 CLINICAL DATA:  High probability for pulmonary embolism EXAM: CT ANGIOGRAPHY CHEST WITH CONTRAST TECHNIQUE: Multidetector CT imaging of the chest was performed using the standard protocol during bolus administration of intravenous contrast. Multiplanar CT image reconstructions and MIPs were obtained to evaluate the vascular anatomy. CONTRAST:  87m OMNIPAQUE IOHEXOL 350 MG/ML SOLN COMPARISON:  06/07/2019 FINDINGS: Cardiovascular: Single subsegmental pulmonary artery filling defect with acute features in the right upper lobe as marked on series 7. Cardiomegaly. Small volume pericardial effusion. Aortic atherosclerosis. No acute aortic finding. Mediastinum/Nodes: Negative for adenopathy or mass. Left breast mass on prior which is not seen today. Lungs/Pleura: Dependent atelectasis that is mild. There is no edema, consolidation, effusion, or pneumothorax. Upper Abdomen: No acute finding Musculoskeletal: Diffuse spinal degeneration with scoliosis. Glenohumeral osteoarthritis bilaterally. Review of the MIP images confirms the above findings. Critical Value/emergent results were called by telephone at the time of interpretation on 09/19/2020 at 5:07 am [facility] who verbally acknowledged these results. IMPRESSION: Single subsegmental pulmonary  embolism to the right upper lobe. Electronically Signed   By: JMonte FantasiaM.D.   On: 09/14/2020 05:07   VAS [place]OWER EXTREMITY VENOUS (DVT)  Result Date: 09/24/2020  Lower Venous DVT Study Patient Name:  Michelle [name] Date of Exam:   09/14/2020 Medical Rec #: [id number]      Accession #:    [id number] of Birth: [birth year]       Patient Gender: F Patient Age:   85years [facility]      [facility] [place]OWER EXTREMITY VENOUS (DVT) Referring Phys: RFuller Plan--------------------------------------------------------------------------------  Indications: Pulmonary embolism.  Comparison Study: no prior Performing Technologist: MArchie PattenRVS  Examination Guidelines: A complete evaluation includes B-mode imaging, spectral Doppler, color Doppler, and power Doppler as needed of all accessible portions of each vessel. Bilateral testing is considered an integral part of a complete examination. Limited examinations for reoccurring indications may be performed as noted. The reflux portion of the exam is performed with the patient in reverse Trendelenburg.  +---------+---------------+---------+-----------+----------+--------------+ RIGHT    CompressibilityPhasicitySpontaneityPropertiesThrombus Aging +---------+---------------+---------+-----------+----------+--------------+ CFV      Full           Yes      Yes                                 +---------+---------------+---------+-----------+----------+--------------+ SFJ      Full                                                        +---------+---------------+---------+-----------+----------+--------------+ FV Prox  Full                                                        +---------+---------------+---------+-----------+----------+--------------+ FV Mid   Full                                                        +---------+---------------+---------+-----------+----------+--------------+  FV  DistalFull                                                        +---------+---------------+---------+-----------+----------+--------------+ PFV      Full                                                        +---------+---------------+---------+-----------+----------+--------------+ POP      Full           Yes      Yes                                 +---------+---------------+---------+-----------+----------+--------------+ PTV      Full                                                        +---------+---------------+---------+-----------+----------+--------------+ PERO     Full                                                        +---------+---------------+---------+-----------+----------+--------------+   +---------+---------------+---------+-----------+----------+--------------+ LEFT     CompressibilityPhasicitySpontaneityPropertiesThrombus Aging +---------+---------------+---------+-----------+----------+--------------+ CFV      Full           Yes      Yes                                 +---------+---------------+---------+-----------+----------+--------------+ SFJ      Full                                                        +---------+---------------+---------+-----------+----------+--------------+ FV Prox  Full                                                        +---------+---------------+---------+-----------+----------+--------------+ FV Mid   Full                                                        +---------+---------------+---------+-----------+----------+--------------+ FV DistalFull                                                        +---------+---------------+---------+-----------+----------+--------------+  PFV      Full                                                        +---------+---------------+---------+-----------+----------+--------------+ POP      Full           Yes      Yes                                  +---------+---------------+---------+-----------+----------+--------------+ PTV      Full                                                        +---------+---------------+---------+-----------+----------+--------------+ PERO     Full                                                        +---------+---------------+---------+-----------+----------+--------------+     Summary: BILATERAL: - No evidence of deep vein thrombosis seen in the lower extremities, bilaterally. -No evidence of popliteal cyst, bilaterally.   *See table(s) above for measurements and observations. Electronically signed by Deitra Mayo MD on 09/26/2020 at 3:29:08 PM.    Final    ECHOCARDIOGRAM LIMITED  Result Date: 10/14/2020    ECHOCARDIOGRAM LIMITED REPORT   Patient Name:   Michelle [name] Date of Exam: 10/07/2020 Medical Rec #:  [id number]      Height:       61.0 in Accession #:    [id number]     Weight:       103.8 lb Date of Birth:  1930/05/17       BSA:          1.430 m Patient Age:    74 years       BP:           112/48 mmHg Patient Gender: F              HR:           94 bpm. Exam Location:  Inpatient Procedure: Limited Echo, Cardiac Doppler and Color Doppler Indications:    Pulmonary Embolus I26.09  History:        Patient has prior history of Echocardiogram examinations, most                 recent 09/14/2020. Stroke, Signs/Symptoms:Dyspnea; Risk                 Factors:Hypertension.  Sonographer:    Vickie Epley RDCS Referring Phys: [id number] RONDELL A SMITH IMPRESSIONS  1. Left ventricular ejection fraction, by estimation, is 60 to 65%. The left ventricle has normal function. The left ventricle has no regional wall motion abnormalities. Left ventricular diastolic parameters were normal.  2. Right ventricular systolic function is normal. The right ventricular size is normal.  3. Left atrial size was mildly dilated.  4. A small pericardial effusion is present. The pericardial effusion is posterior  to the left  ventricle and lateral to the left ventricle.  5. The mitral valve is degenerative. Mild mitral valve regurgitation. No evidence of mitral stenosis. Moderate mitral annular calcification.  6. Gradients have increased from 09/14/20 mean 16.7 -> 24 mmHg peak 27 -> 38.6 mmHg' Valve area's do not correlate as previous LvOT diameter used much smaller at 1.1 cm . The aortic valve is abnormal. There is moderate calcification of the aortic valve. There is moderate thickening of the aortic valve. Aortic valve regurgitation is mild. Moderate aortic valve stenosis.  7. The inferior vena cava is normal in size with greater than 50% respiratory variability, suggesting right atrial pressure of 3 mmHg. FINDINGS  Left Ventricle: Left ventricular ejection fraction, by estimation, is 60 to 65%. The left ventricle has normal function. The left ventricle has no regional wall motion abnormalities. The left ventricular internal cavity size was normal in size. There is  no left ventricular hypertrophy. Left ventricular diastolic parameters were normal. Right Ventricle: The right ventricular size is normal. No increase in right ventricular wall thickness. Right ventricular systolic function is normal. Left Atrium: Left atrial size was mildly dilated. Right Atrium: Right atrial size was normal in size. Pericardium: A small pericardial effusion is present. The pericardial effusion is posterior to the left ventricle and lateral to the left ventricle. Mitral Valve: The mitral valve is degenerative in appearance. There is mild thickening of the mitral valve leaflet(s). There is mild calcification of the mitral valve leaflet(s). Moderate mitral annular calcification. Mild mitral valve regurgitation. No evidence of mitral valve stenosis. MV peak gradient, 8.0 mmHg. The mean mitral valve gradient is 4.0 mmHg. Tricuspid Valve: The tricuspid valve is normal in structure. Tricuspid valve regurgitation is not demonstrated. No evidence of  tricuspid stenosis. Aortic Valve: Gradients have increased from 09/14/20 mean 16.7 -> 24 mmHg peak 27 -> 38.6 mmHg' Valve area's do not correlate as previous LvOT diameter used much smaller at 1.1 cm. The aortic valve is abnormal. There is moderate calcification of the aortic  valve. There is moderate thickening of the aortic valve. Aortic valve regurgitation is mild. Moderate aortic stenosis is present. Aortic valve mean gradient measures 24.0 mmHg. Aortic valve peak gradient measures 38.6 mmHg. Aortic valve area, by VTI measures 1.01 cm. Pulmonic Valve: The pulmonic valve was normal in structure. Pulmonic valve regurgitation is not visualized. No evidence of pulmonic stenosis. Aorta: The aortic root is normal in size and structure. Venous: The inferior vena cava is normal in size with greater than 50% respiratory variability, suggesting right atrial pressure of 3 mmHg. IAS/Shunts: No atrial level shunt detected by color flow Doppler. LEFT VENTRICLE PLAX 2D LVIDd:         4.50 cm     Diastology LVIDs:         3.30 cm     LV e' medial:    8.27 cm/s LV PW:         0.90 cm     LV E/e' medial:  14.1 LV IVS:        0.90 cm     LV e' lateral:   9.14 cm/s LVOT diam:     1.80 cm     LV E/e' lateral: 12.8 LV SV:         67 LV SV Index:   47 LVOT Area:     2.54 cm  LV Volumes (MOD) LV vol d, MOD A2C: 75.3 ml LV vol d, MOD A4C: 99.4 ml LV vol s, MOD A2C: 27.9 ml  LV vol s, MOD A4C: 52.1 ml LV SV MOD A2C:     47.4 ml LV SV MOD A4C:     99.4 ml LV SV MOD BP:      47.7 ml LEFT ATRIUM         Index LA diam:    4.00 cm 2.80 cm/m  AORTIC VALVE AV Area (Vmax):    1.07 cm AV Area (Vmean):   0.98 cm AV Area (VTI):     1.01 cm AV Vmax:           310.50 cm/s AV Vmean:          236.000 cm/s AV VTI:            0.658 m AV Peak Grad:      38.6 mmHg AV Mean Grad:      24.0 mmHg LVOT Vmax:         131.00 cm/s LVOT Vmean:        91.200 cm/s LVOT VTI:          0.262 m LVOT/AV VTI ratio: 0.40  AORTA Ao Root diam: 2.40 cm MITRAL VALVE MV Area  (PHT): 6.07 cm     SHUNTS MV Area VTI:   2.29 cm     Systemic VTI:  0.26 m MV Peak grad:  8.0 mmHg     Systemic Diam: 1.80 cm MV Mean grad:  4.0 mmHg MV Vmax:       1.41 m/s MV Vmean:      90.5 cm/s MV Decel Time: 125 msec MR Peak grad: 102.4 mmHg MR Vmax:      506.00 cm/s MV E velocity: 117.00 cm/s MV A velocity: 86.30 cm/s MV E/A ratio:  1.36 Jenkins Rouge MD Electronically signed by Jenkins Rouge MD Signature Date/Time: 10/09/2020/9:30:24 AM    Final         Scheduled Meds:  anastrozole  1 mg Oral Daily   apixaban  10 mg Oral BID   Followed by   Derrill Memo ON 10/07/2020] apixaban  5 mg Oral BID   feeding supplement  237 mL Oral BID BM   mirtazapine  15 mg Oral QHS   senna-docusate  1 tablet Oral QHS   sodium chloride flush  3 mL Intravenous Q12H   triamcinolone ointment  1 application Topical Daily   Continuous Infusions:     LOS: 1 day    Time spent: 34 minutes    Barb Merino, MD [facility] Pager [phone]

## 2020-09-30 NOTE — TOC Benefit Eligibility Note (Signed)
Patient Teacher, English as a foreign language completed.    The patient is currently admitted and upon discharge could be taking Eliquis 5 mg.  The current 30 day co-pay is, $0.00.   The patient is insured through [place], [place] Patient Advocate Specialist [place] Team Direct Number: [phone]  Fax: [phone]

## 2020-09-30 NOTE — Plan of Care (Signed)
  Problem: Education: Goal: Knowledge of General Education information will improve Description Including pain rating scale, medication(s)/side effects and non-pharmacologic comfort measures Outcome: Progressing   

## 2020-09-30 NOTE — TOC Progression Note (Signed)
Transition of Care [facility]) - Progression Note    Patient Details  Name: Michelle [name] MRN: [id number] Date of Birth: [birth year]  [facility] CM/SW Contact  Zenon Mayo, RN Phone Number: 09/30/2020, 10:28 AM  Clinical Narrative:    Patient is active with [facility] for HHRN,,HHPT,HHOT, HHAIDE.        Expected Discharge Plan and Services                                                 Social Determinants of Health (SDOH) Interventions    Readmission Risk Interventions No flowsheet data found.

## 2020-10-01 ENCOUNTER — Inpatient Hospital Stay (HOSPITAL_COMMUNITY): Payer: Medicare HMO

## 2020-10-01 DIAGNOSIS — I2699 Other pulmonary embolism without acute cor pulmonale: Secondary | ICD-10-CM | POA: Diagnosis not present

## 2020-10-01 DIAGNOSIS — N390 Urinary tract infection, site not specified: Secondary | ICD-10-CM | POA: Diagnosis not present

## 2020-10-01 LAB — GLUCOSE, CAPILLARY: Glucose-Capillary: 140 mg/dL — ABNORMAL HIGH (ref 70–99)

## 2020-10-01 LAB — CBC WITH DIFFERENTIAL/PLATELET
Abs Immature Granulocytes: 0.16 10*3/uL — ABNORMAL HIGH (ref 0.00–0.07)
Abs Immature Granulocytes: 0.18 10*3/uL — ABNORMAL HIGH (ref 0.00–0.07)
Basophils Absolute: 0 10*3/uL (ref 0.0–0.1)
Basophils Absolute: 0 10*3/uL (ref 0.0–0.1)
Basophils Relative: 0 %
Basophils Relative: 0 %
Eosinophils Absolute: 0 10*3/uL (ref 0.0–0.5)
Eosinophils Absolute: 0 10*3/uL (ref 0.0–0.5)
Eosinophils Relative: 0 %
Eosinophils Relative: 0 %
HCT: 27.2 % — ABNORMAL LOW (ref 36.0–46.0)
HCT: 28 % — ABNORMAL LOW (ref 36.0–46.0)
Hemoglobin: 8.9 g/dL — ABNORMAL LOW (ref 12.0–15.0)
Hemoglobin: 9.1 g/dL — ABNORMAL LOW (ref 12.0–15.0)
Immature Granulocytes: 1 %
Immature Granulocytes: 1 %
Lymphocytes Relative: 4 %
Lymphocytes Relative: 4 %
Lymphs Abs: 0.7 10*3/uL (ref 0.7–4.0)
Lymphs Abs: 0.7 10*3/uL (ref 0.7–4.0)
MCH: 30.5 pg (ref 26.0–34.0)
MCH: 30.4 pg (ref 26.0–34.0)
MCHC: 32.7 g/dL (ref 30.0–36.0)
MCHC: 32.5 g/dL (ref 30.0–36.0)
MCV: 93.2 fL (ref 80.0–100.0)
MCV: 93.6 fL (ref 80.0–100.0)
Monocytes Absolute: 0.9 10*3/uL (ref 0.1–1.0)
Monocytes Absolute: 1.1 10*3/uL — ABNORMAL HIGH (ref 0.1–1.0)
Monocytes Relative: 5 %
Monocytes Relative: 6 %
Neutro Abs: 15 10*3/uL — ABNORMAL HIGH (ref 1.7–7.7)
Neutro Abs: 17.5 10*3/uL — ABNORMAL HIGH (ref 1.7–7.7)
Neutrophils Relative %: 90 %
Neutrophils Relative %: 89 %
Platelets: 221 10*3/uL (ref 150–400)
Platelets: 260 10*3/uL (ref 150–400)
RBC: 2.92 MIL/uL — ABNORMAL LOW (ref 3.87–5.11)
RBC: 2.99 MIL/uL — ABNORMAL LOW (ref 3.87–5.11)
RDW: 17.6 % — ABNORMAL HIGH (ref 11.5–15.5)
RDW: 17.7 % — ABNORMAL HIGH (ref 11.5–15.5)
WBC: 16.8 10*3/uL — ABNORMAL HIGH (ref 4.0–10.5)
WBC: 19.5 10*3/uL — ABNORMAL HIGH (ref 4.0–10.5)
nRBC: 0 % (ref 0.0–0.2)
nRBC: 0.1 % (ref 0.0–0.2)

## 2020-10-01 LAB — BLOOD GAS, ARTERIAL
Acid-base deficit: 3 mmol/L — ABNORMAL HIGH (ref 0.0–2.0)
Bicarbonate: 20.7 mmol/L (ref 20.0–28.0)
Drawn by: 331761
FIO2: 15
O2 Saturation: 86.1 %
Patient temperature: 36.5
pCO2 arterial: 31.7 mmHg — ABNORMAL LOW (ref 32.0–48.0)
pH, Arterial: 7.428 (ref 7.350–7.450)
pO2, Arterial: 50.7 mmHg — ABNORMAL LOW (ref 83.0–108.0)

## 2020-10-01 LAB — PHOSPHORUS: Phosphorus: 2.2 mg/dL — ABNORMAL LOW (ref 2.5–4.6)

## 2020-10-01 LAB — BASIC METABOLIC PANEL
Anion gap: 6 (ref 5–15)
BUN: 15 mg/dL (ref 8–23)
CO2: 24 mmol/L (ref 22–32)
Calcium: 8.1 mg/dL — ABNORMAL LOW (ref 8.9–10.3)
Chloride: 103 mmol/L (ref 98–111)
Creatinine, Ser: 0.69 mg/dL (ref 0.44–1.00)
GFR, Estimated: >60 mL/min (ref >60)
Glucose, Bld: 148 mg/dL — ABNORMAL HIGH (ref 70–99)
Potassium: 4 mmol/L (ref 3.5–5.1)
Sodium: 133 mmol/L — ABNORMAL LOW (ref 135–145)

## 2020-10-01 LAB — MAGNESIUM: Magnesium: 1.7 mg/dL (ref 1.7–2.4)

## 2020-10-01 LAB — PROCALCITONIN: Procalcitonin: 0.3 ng/mL

## 2020-10-01 LAB — URINE CULTURE: Culture: 50000 — AB

## 2020-10-01 LAB — LACTIC ACID, PLASMA
Lactic Acid, Venous: 2.2 mmol/L (ref 0.5–1.9)
Lactic Acid, Venous: 1.5 mmol/L (ref 0.5–1.9)

## 2020-10-01 LAB — APTT: aPTT: 77 seconds — ABNORMAL HIGH (ref 24–36)

## 2020-10-01 LAB — TROPONIN I (HIGH SENSITIVITY)
Troponin I (High Sensitivity): 985 ng/L (ref <18)
Troponin I (High Sensitivity): 987 ng/L (ref <18)

## 2020-10-01 MED ORDER — HEPARIN (PORCINE) 25000 UT/250ML-% IV SOLN
700.0000 [IU]/h | INTRAVENOUS | Status: DC
  Administered 2020-10-01: 700 [IU]/h via INTRAVENOUS
  Filled 2020-10-01: qty 250

## 2020-10-01 MED ORDER — SODIUM CHLORIDE 0.9 % IV SOLN: 2.0000 g | INTRAVENOUS | Status: DC

## 2020-10-01 MED ORDER — FUROSEMIDE 10 MG/ML IJ SOLN
40.0000 mg | Freq: Once | INTRAMUSCULAR | Status: AC
  Administered 2020-10-01: 40 mg via INTRAVENOUS
  Filled 2020-10-01: qty 4

## 2020-10-01 MED ORDER — VANCOMYCIN HCL IN DEXTROSE 1-5 GM/200ML-% IV SOLN
1000.0000 mg | INTRAVENOUS | Status: DC
  Administered 2020-10-01: 1000 mg via INTRAVENOUS
  Filled 2020-10-01 (×2): qty 200

## 2020-10-01 MED ORDER — SODIUM CHLORIDE 0.9 % IV SOLN
2.0000 g | Freq: Two times a day (BID) | INTRAVENOUS | Status: DC
  Administered 2020-10-01 – 2020-10-04 (×7): 2 g via INTRAVENOUS
  Filled 2020-10-01 (×7): qty 2

## 2020-10-01 MED ORDER — CHLORHEXIDINE GLUCONATE CLOTH 2 % EX PADS
6.0000 | MEDICATED_PAD | Freq: Every day | CUTANEOUS | Status: DC
  Administered 2020-10-01 – 2020-10-10 (×10): 6 via TOPICAL

## 2020-10-01 MED ORDER — KETOROLAC TROMETHAMINE 15 MG/ML IJ SOLN
15.0000 mg | Freq: Four times a day (QID) | INTRAMUSCULAR | Status: AC | PRN
  Administered 2020-10-01 – 2020-10-03 (×3): 15 mg via INTRAVENOUS
  Filled 2020-10-01 (×4): qty 1

## 2020-10-01 NOTE — Progress Notes (Signed)
PROGRESS NOTE    Michelle [name]  [id number] DOB: Aug 22, 1930 DOA: 09/27/2020 PCP: [name], MD    Brief Narrative:  87 year old female with history of hypertension, breast cancer on [place], GERD presented with chest pain.  Recent hospitalization with sepsis, bacteremia and acute ITP.  Currently on amoxicillin.  At the emergency room, afebrile.  Tachycardic.  Blood pressure stable.  On room air.  WBC 20,000.  Troponins 115-270.  Chest x-ray with right pleural effusion.  CTA with single subsegmental pulmonary artery right upper lobe acute pulmonary embolism.  Started on heparin drip. Patient was treated with heparin and subsequently changed to Eliquis, overnight 8/17 with increased oxygen demand, chest pain.  Slight bump in troponins. Chest x-ray consistent with right lower lobe opacity, consolidation versus infarction.   Assessment & Plan:   Principal Problem:   Acute pulmonary embolism without acute cor pulmonale (HCC) Active Problems:   Arthritis   Malignant neoplasm of overlapping sites of right breast in female, estrogen receptor positive (HCC)   Anemia   SIRS (systemic inflammatory response syndrome) (HCC)   Transient hypotension   Elevated troponin   Dysuria   History of ITP   History of bacteremia   Acute lower UTI  Acute pulmonary embolism without cor pulmonale: Currently on heparin infusion. Changed to Eliquis 8/17, will hold off until clinically improved. Echocardiogram with no right heart strain.  No regional wall motion abnormalities. Lower extremity duplex is negative for presence of any acute DVT.  Acute hypoxemic respiratory failure/suspected right lower lobe pneumonia/Pseudomonas UTI: After admission, patient continued to have increasing oxygen demand.  Repeat chest x-ray on 8/18 with right lower lobe consolidation, atelectasis and also possible lung infarction. Keep on oxygen to keep saturation more than 90%. Will treat as hospital-acquired pneumonia  with vancomycin and cefepime. Cefepime should also cover Pseudomonas UTI. Start chest physiotherapy when patient is more alert and participating.  Elevated troponins: Due to pulmonary embolism.   Echocardiogram with no regional wall motion abnormalities.  Serial EKG with no ischemic changes.  Already on heparin.  Recent Aerococcus bacteremia: Completed amoxicillin therapy.  Now treatment for UTI and pneumonia.  Recent history of ITP: Stabilized.  Seen by oncology.  He started on prednisone.  History of breast cancer: Followed by oncology as outpatient.  On anastrozole.  Needs reevaluation with active thromboembolism.   DVT prophylaxis: heparin infusion    Code Status: Full code Family Communication: daughter at the bedside. Disposition Plan: Status is: Inpatient  Remains inpatient appropriate because:Inpatient level of care appropriate due to severity of illness  Dispo: The patient is from: Home              Anticipated d/c is to: Home, unknown at this time.              Patient currently is not medically stable to d/c.   Difficult to place patient No         Consultants:  Oncology  Procedures:  None  Antimicrobials:  None   Subjective: Patient seen and examined.  She was sleepy after getting a dose of morphine.  Overnight events noted.  Patient complained of chest discomfort and back pain.  Troponins elevated 900.  EKG is nonischemic.  Patient is requiring more oxygen. Overnight, resumed heparin. Ordered chest x-ray and reviewed that shows extensive right lower lobe consolidation.  Patient is afebrile.  We will treat as pneumonia.  There is also possibility of lung infarction, however she had subsegmental PE on the right upper  lobe.  Objective: Vitals:   10/01/20 0714 10/01/20 0746 10/01/20 1203 10/01/20 1211  BP:  137/61 (!) 99/53   Pulse: (!) 111 (!) 102 (!) 118 100  Resp: '15 14 15 18  '$ Temp:  97.8 F (36.6 C) 97.8 F (36.6 C)   TempSrc:  Oral Oral   SpO2:  90% 96% 92% 91%  Weight:      Height:        Intake/Output Summary (Last 24 hours) at 10/01/2020 1340 Last data filed at 10/01/2020 0645 Gross per 24 hour  Intake 600 ml  Output 450 ml  Net 150 ml   Filed Weights   09/16/2020 0700  Weight: 47.1 kg    Examination:  General exam: Appears lethargic and sleepy.Frail and debilitated.  Chronically sick looking.  Not in any distress.  Currently on high flow oxygen. SpO2: 91 % O2 Flow Rate (L/min): 10 L/min  Respiratory system: Clear to auscultation. Respiratory effort normal.  No added sounds. Cardiovascular system: S1 & S2 heard, RRR.  Tachycardic. Gastrointestinal system: Abdomen is nondistended, soft and nontender. No organomegaly or masses felt. Normal bowel sounds heard. Central nervous system: Alert and oriented. No focal neurological deficits. Extremities: Symmetric 5 x 5 power. Skin: No rashes, lesions or ulcers Psychiatry: Judgement and insight appear normal.  Anxious.    Data Reviewed: I have personally reviewed following labs and imaging studies  CBC: Recent Labs  Lab 10/05/2020 0120 09/30/20 0028 10/01/20 0118  WBC 20.3* 19.0* 16.8*  NEUTROABS 18.0*  --  15.0*  HGB 10.1* 8.8* 8.9*  HCT 31.3* 26.9* 27.2*  MCV 94.0 93.4 93.2  PLT 217 206 [id number]   Basic Metabolic Panel: Recent Labs  Lab 10/02/2020 0120 10/01/20 0118  NA 132* 133*  K 4.4 4.0  CL 96* 103  CO2 26 24  GLUCOSE 187* 148*  BUN 17 15  CREATININE 0.80 0.69  CALCIUM 8.8* 8.1*  MG  --  1.7  PHOS  --  2.2*   GFR: Estimated Creatinine Clearance: 36.1 mL/min (by C-G formula based on SCr of 0.69 mg/dL). Liver Function Tests: Recent Labs  Lab 10/09/2020 0120  AST 46*  ALT 61*  ALKPHOS 78  BILITOT 0.8  PROT 6.3*  ALBUMIN 2.5*   No results for input(s): LIPASE, AMYLASE in the last 168 hours. No results for input(s): AMMONIA in the last 168 hours. Coagulation Profile: No results for input(s): INR, PROTIME in the last 168 hours. Cardiac Enzymes: No  results for input(s): CKTOTAL, CKMB, CKMBINDEX, TROPONINI in the last 168 hours. BNP (last 3 results) No results for input(s): PROBNP in the last 8760 hours. HbA1C: No results for input(s): HGBA1C in the last 72 hours. CBG: No results for input(s): GLUCAP in the last 168 hours. Lipid Profile: No results for input(s): CHOL, HDL, LDLCALC, TRIG, CHOLHDL, LDLDIRECT in the last 72 hours. Thyroid Function Tests: No results for input(s): TSH, T4TOTAL, FREET4, T3FREE, THYROIDAB in the last 72 hours. Anemia Panel: No results for input(s): VITAMINB12, FOLATE, FERRITIN, TIBC, IRON, RETICCTPCT in the last 72 hours. Sepsis Labs: Recent Labs  Lab 10/13/2020 0324 09/28/2020 0510  LATICACIDVEN 1.5 1.9    Recent Results (from the past 240 hour(s))  Blood culture (routine x 2)     Status: None (Preliminary result)   Collection Time: 10/13/2020  3:24 AM   Specimen: BLOOD  Result Value Ref Range Status   Specimen Description BLOOD RIGHT ANTECUBITAL  Final   Special Requests   Final    BOTTLES DRAWN AEROBIC  AND ANAEROBIC Blood Culture adequate volume   Culture   Final    NO GROWTH 2 DAYS Performed at [facility], [place] [address], [place], [place] [address]    Report Status PENDING  Incomplete  Resp Panel by RT-PCR (Flu A&B, Covid) Nasopharyngeal Swab     Status: None   Collection Time: 10/03/2020  3:29 AM   Specimen: Nasopharyngeal Swab; Nasopharyngeal(NP) swabs in vial transport medium  Result Value Ref Range Status   SARS Coronavirus 2 by RT PCR NEGATIVE NEGATIVE Final    Comment: (NOTE) SARS-CoV-2 target nucleic acids are NOT DETECTED.  The SARS-CoV-2 RNA is generally detectable in upper respiratory specimens during the acute phase of infection. The lowest concentration of SARS-CoV-2 viral copies this assay can detect is 138 copies/mL. A negative result does not preclude SARS-Cov-2 infection and should not be used as the sole basis for treatment or other patient management decisions. A  negative result may occur with  improper specimen collection/handling, submission of specimen other than nasopharyngeal swab, presence of viral mutation(s) within the areas targeted by this assay, and inadequate number of viral copies(<138 copies/mL). A negative result must be combined with clinical observations, patient history, and epidemiological information. The expected result is Negative.  Fact Sheet for Patients:  EntrepreneurPulse.com.au  Fact Sheet for Healthcare Providers:  IncredibleEmployment.be  This test is no t yet approved or cleared by the [place] FDA and  has been authorized for detection and/or diagnosis of SARS-CoV-2 by FDA under an Emergency Use Authorization (EUA). This EUA will remain  in effect (meaning this test can be used) for the duration of the COVID-19 declaration under Section 564(b)(1) of the Act, 21 U.S.C.section 360bbb-3(b)(1), unless the authorization is terminated  or revoked sooner.       Influenza A by PCR NEGATIVE NEGATIVE Final   Influenza B by PCR NEGATIVE NEGATIVE Final    Comment: (NOTE) The Xpert Xpress SARS-CoV-2/FLU/RSV plus assay is intended as an aid in the diagnosis of influenza from Nasopharyngeal swab specimens and should not be used as a sole basis for treatment. Nasal washings and aspirates are unacceptable for Xpert Xpress SARS-CoV-2/FLU/RSV testing.  Fact Sheet for Patients: EntrepreneurPulse.com.au  Fact Sheet for Healthcare Providers: IncredibleEmployment.be  This test is not yet approved or cleared by the [place] FDA and has been authorized for detection and/or diagnosis of SARS-CoV-2 by FDA under an Emergency Use Authorization (EUA). This EUA will remain in effect (meaning this test can be used) for the duration of the COVID-19 declaration under Section 564(b)(1) of the Act, 21 U.S.C. section 360bbb-3(b)(1), unless the authorization  is terminated or revoked.  Performed at [facility], [place] [address] [place], [place] [address]   Blood culture (routine x 2)     Status: None (Preliminary result)   Collection Time: 09/28/2020  3:40 AM   Specimen: BLOOD RIGHT FOREARM  Result Value Ref Range Status   Specimen Description BLOOD RIGHT FOREARM  Final   Special Requests   Final    BOTTLES DRAWN AEROBIC AND ANAEROBIC Blood Culture adequate volume   Culture   Final    NO GROWTH 2 DAYS Performed at [facility], [place] [address] [place], [place] [address]    Report Status PENDING  Incomplete  Urine Culture     Status: Abnormal   Collection Time: 09/23/2020  5:16 AM   Specimen: Urine, Clean Catch  Result Value Ref Range Status   Specimen Description URINE, CLEAN CATCH  Final   Special Requests   Final    NONE Performed at [facility], [place] [address] [place], [place] [address]    Culture 50,000 COLONIES/mL PSEUDOMONAS AERUGINOSA (A)  Final   Report Status 10/01/2020 FINAL  Final   Organism ID, Bacteria PSEUDOMONAS AERUGINOSA (A)  Final      Susceptibility   Pseudomonas aeruginosa - MIC*    CEFTAZIDIME 4 SENSITIVE Sensitive     CIPROFLOXACIN <=0.25 SENSITIVE Sensitive     GENTAMICIN <=1 SENSITIVE Sensitive     IMIPENEM 2 SENSITIVE Sensitive     PIP/TAZO 8 SENSITIVE Sensitive     CEFEPIME 2 SENSITIVE Sensitive     * 50,000 COLONIES/mL PSEUDOMONAS AERUGINOSA         Radiology Studies: DG CHEST PORT 1 VIEW  Result Date: 10/01/2020 CLINICAL DATA:  Shortness of breath. EXAM: PORTABLE CHEST 1 VIEW COMPARISON:  September 29, 2020. FINDINGS: Stable cardiomegaly. Interval development of large right upper and lower lobe airspace opacity is noted consistent with pneumonia and associated right pleural effusion. Mild left basilar atelectasis and pleural effusion is noted. Bony thorax is unremarkable. IMPRESSION: Interval development of large right lung opacity is noted consistent with pneumonia and associated  right pleural effusion. Mild left basilar atelectasis and pleural effusion is noted. Aortic Atherosclerosis (ICD10-I70.0). Electronically Signed   By: Marijo Conception M.D.   On: 10/01/2020 12:25        Scheduled Meds:  anastrozole  1 mg Oral Daily   feeding supplement  237 mL Oral BID BM   mirtazapine  15 mg Oral QHS   predniSONE  40 mg Oral Q breakfast   senna-docusate  1 tablet Oral QHS   sodium chloride flush  3 mL Intravenous Q12H   triamcinolone ointment  1 application Topical Daily   Continuous Infusions:  ceFEPime (MAXIPIME) IV     heparin 700 Units/hr (10/01/20 0855)      LOS: 2 days    Time spent: 34 minutes    Barb Merino, MD [facility] Pager [phone]

## 2020-10-01 NOTE — Progress Notes (Signed)
Pt appears to be getting restless, keeps taking O2 off and placing in bed, is aware of her surroundings ,maintaining spo2 93%.

## 2020-10-01 NOTE — Progress Notes (Signed)
Date and time results received: 10/01/20 1709   Test Lactic Acid Critical Value: 2.2  Name of Provider Notified: Dr Valeta Harms  Orders Received? Or Actions Taken?: no new orders given at this time

## 2020-10-01 NOTE — Progress Notes (Signed)
Pharmacy Antibiotic Note  [name] is a 87 y.o. female admitted on 09/19/2020 with  chest pain .  Patient now with pneumonia and worsening shortness of breath.   Pharmacy has been consulted for vancomycin and cefepime dosing. Noted patient was ordered cefepime earlier to treat symptomatic pseudomonas UTI, will increase cefepime dosing for HCAP.   Patient currently afebrile, wbc 16.8. Renal function normal, but crcl is low d/t age.   Vancomycin 1000 mg IV Q 48 hrs. Goal AUC 400-550. Expected AUC: 513 SCr used: 1.0 (rounded up d/t age)  Plan: Vancomycin 1g IV q48 hours Increase Cefepime 2g q12 hours to treat HCAP  Height: '5\' 1"'$  (154.9 cm) Weight: 47.1 kg (103 lb 13.4 oz) IBW/kg (Calculated) : 47.8  Temp (24hrs), Avg:98.1 F (36.7 C), Min:97.8 F (36.6 C), Max:98.5 F (36.9 C)  Recent Labs  Lab 09/15/2020 0120 09/20/2020 0324 09/23/2020 0510 09/30/20 0028 10/01/20 0118  WBC 20.3*  --   --  19.0* 16.8*  CREATININE 0.80  --   --   --  0.69  LATICACIDVEN  --  1.5 1.9  --   --     Estimated Creatinine Clearance: 36.1 mL/min (by C-G formula based on SCr of 0.69 mg/dL).    No Known Allergies  Thank you for allowing pharmacy to be a part of this patient's care.  Erin Hearing PharmD., BCPS Clinical Pharmacist 10/01/2020 2:53 PM

## 2020-10-01 NOTE — Progress Notes (Signed)
Patient was transported to 54M08 on NRB without any complications. Patient was placed back on [place] at 100% FIO2 & 30L once arriving to 54M. Report was given to 54M RT.

## 2020-10-01 NOTE — Progress Notes (Signed)
Patient continues to be in poor respiratory status.  She is on increasing oxygen requirement.  Reevaluated patient.  Lethargic on 15 L.  Chest x-ray with large right sided consolidation. Called critical care consult, patient seen at the bedside.  We decided to move her to intensive care unit with critical care for impending respiratory failure.  Discussed and updated patient's son Mr. [name] at bedside and daughter [name].  Patient does not have any advanced directive.  They both make combined decisions. Discussed about possible need for intubation.  Since they tend to have any directives on their mom, if this is for pneumonia and reversible they would like to agree with full code and mechanical ventilation if needed.

## 2020-10-01 NOTE — Progress Notes (Signed)
Pts o2 remained between 85-87. On Hf switched to rebreather mask . Will continue to monitor and wean back to  Hf , pt now resting in bed v/s are stable Drcame to see patient .

## 2020-10-01 NOTE — Progress Notes (Signed)
ANTICOAGULATION CONSULT NOTE   Pharmacy Consult for Apixaban -> Heparin  Indication: pulmonary embolus, rule out ACS  No Known Allergies  Patient Measurements: ~50 kg  Vital Signs: Temp: 97.7 F (36.5 C) (08/18 1500) Temp Source: Axillary (08/18 1500) BP: 137/58 (08/18 1545) Pulse Rate: 127 (08/18 1615)  Labs: Recent Labs    10/09/2020 0120 10/06/2020 0324 10/06/2020 1113 10/02/2020 1620 09/30/20 0028 10/01/20 0118 10/01/20 0447 10/01/20 1540 10/01/20 1647  HGB 10.1*  --   --   --  8.8* 8.9*  --  9.1*  --   HCT 31.3*  --   --   --  26.9* 27.2*  --  28.0*  --   PLT 217  --   --   --  206 221  --  260  --   APTT  --   --   --   --   --   --   --   --  77*  HEPARINUNFRC  --   --   --  0.49 0.55  --   --   --   --   CREATININE 0.80  --   --   --   --  0.69  --   --   --   TROPONINIHS 155*   < > 576*  --   --  985* 987*  --   --    < > = values in this interval not displayed.     Estimated Creatinine Clearance: 36.1 mL/min (by C-G formula based on SCr of 0.69 mg/dL).   Medical History: Past Medical History:  Diagnosis Date   Arthritis    Cancer ([place])    Bilaterial Breast Cancer   Dyspnea    GERD (gastroesophageal reflux disease)    Hypertension    Stroke [facility])    "mini stroke" per patient     Assessment: 87 y/o F with history of breast cancer, found to have small PE 10/10/2020. No anticoagulation prior to admission. Pharmacy consulted for heparin.     Pt was transitioned to apixaban 8/17 - last '10mg'$  dose given 8/17 2031. Overnight pt with increasing troponins so MD would like to switch back to heparin gtt. Apixaban will be affecting heparin level so will utilize aPTT until levels correlate.  APTT 77 is therapeutic. H/H, plt stable.  Goal of Therapy:  Heparin level 0.3-0.7 units/ml, aPTT 66-102 sec Monitor platelets by anticoagulation protocol: Yes   Plan:   Continue heparin gtt at 700 units/hr F/u aPTT until correlates with heparin level  Monitor daily aPTT, HL,  CBC/plt Monitor for signs/symptoms of bleeding    Benetta Spar, PharmD, BCPS, BCCP Clinical Pharmacist  Please check AMION for all [place] phone numbers After 10:00 PM, call [place]

## 2020-10-01 NOTE — Progress Notes (Signed)
[place] for Apixaban -> Heparin  Indication: pulmonary embolus, rule out ACS  No Known Allergies  Patient Measurements: ~50 kg  Vital Signs: Temp: 97.8 F (36.6 C) (08/18 0245) Temp Source: Axillary (08/18 0245) BP: 109/51 (08/18 0400) Pulse Rate: 91 (08/18 0500)  Labs: Recent Labs    10/10/2020 0120 09/24/2020 0324 10/12/2020 0510 09/27/2020 1113 09/30/2020 1620 09/30/20 0028 10/01/20 0118  HGB 10.1*  --   --   --   --  8.8* 8.9*  HCT 31.3*  --   --   --   --  26.9* 27.2*  PLT 217  --   --   --   --  206 221  HEPARINUNFRC  --   --   --   --  0.49 0.55  --   CREATININE 0.80  --   --   --   --   --  0.69  TROPONINIHS 155*   < > 317* 576*  --   --  985*   < > = values in this interval not displayed.     Estimated Creatinine Clearance: 36.1 mL/min (by C-G formula based on SCr of 0.69 mg/dL).   Medical History: Past Medical History:  Diagnosis Date   Arthritis    Cancer ([place])    Bilaterial Breast Cancer   Dyspnea    GERD (gastroesophageal reflux disease)    Hypertension    Stroke [facility])    "mini stroke" per patient     Assessment: 87 y/o F with history of breast cancer, found to have small PE and elevated troponin. Requested to start heparin per pharmacy. She is recovering from an episode on ITP with a nice platelet recovery up to 217. Hgb 10.1 Renal function good. No anti-coagulation PTA.   Pt was transitioned to apixaban 8/17 - last '10mg'$  dose given 8/17 2031. Overnight pt with increasing troponins so MD would like to switch back to heparin gtt. Apixaban will be affecting heparin level so will utilize aPTT until levels correlate.  Goal of Therapy:  Heparin level 0.3-0.7 units/ml, aPTT 66-102 sec Monitor platelets by anticoagulation protocol: Yes   Plan:  Discontinue apixaban At 0830, restart heparin gtt at 700 units/hr F/u 8 hr aPTT Daily heparin level, CBC, and aPTT  Sherlon Handing, PharmD, BCPS Please see amion for  complete clinical pharmacist phone list 10/01/2020 5:54 AM

## 2020-10-01 NOTE — Progress Notes (Signed)
PT Cancellation Note  Patient Details Name: Michelle [name] MRN: [id number] DOB: 1931/01/22   Cancelled Treatment:    Reason Eval/Treat Not Completed: Medical issues which prohibited therapy. Pt with chest pain overnight, increased trop, and tachycardia. Resting HR 128. Per RN, MD wants pt to rest today.   Lorriane Shire 10/01/2020, 10:20 AM  Lorrin Goodell, PT  Office # [phone] Pager [phone]

## 2020-10-01 NOTE — Progress Notes (Signed)
MD on call notified of pt satting in 70s on nonrebreather mask. MD on call notified CCM. Imaging results reviewed pt found to having infiltrate on CXR. Pt was placed on heated high flow and an order to transfer to higher level of care was placed. CCM and hospitalist both spoke with pt's daughter and son about current medical status and future plans of care. Report called to [place], RN on [facility].

## 2020-10-01 NOTE — Consult Note (Addendum)
NAME:  Michelle [name], MRN:  [id number], DOB:  Aug 31, 1930, LOS: 2 ADMISSION DATE:  10/12/2020, CONSULTATION DATE:  10/01/2020 REFERRING MD:  Dr. Sloan Leiter, CHIEF COMPLAINT:  Respiratory Distress    History of Present Illness:  87 year old female presents to ED on 8/16 with chest pain. CXR with small right sided pleural effusion, CT chest with single subsegmental pulmonary artery defect of right upper lobe, started on heparin gtt. 8/17 patient with increased oxygen requirements, on 8/18 continued with progressive hypoxia with oxygen saturation 88% on NRB with tachycardia. U/A +pseudomonas. Critical Care Consulted. CXR with noted increasing right lobe infiltrate. Placed on Cefepime/Vancomycin. Transferred to ICU for further work-up.   Of note patient with recent admission 7/25-8/4 with sepsis secondary to aeroccus urinae bacteremia and UTI with acute ITP. Treated with Rocephin, then Ampicillin, discharged on amoxicillin for 13 days.   Pertinent  Medical History  HTN, Breast CA on ibrance, GERD  Significant Hospital Events: Including procedures, antibiotic start and stop dates in addition to other pertinent events   8/16 > admitted to ED 8/18 > transferred to ICU   Interim History / Subjective:  As above.   Objective   Blood pressure (!) 130/54, pulse (!) 124, temperature 97.7 F (36.5 C), temperature source Axillary, resp. rate (!) 25, height '5\' 1"'$  (1.549 m), weight 47.1 kg, SpO2 (!) 79 %.        Intake/Output Summary (Last 24 hours) at 10/01/2020 1538 Last data filed at 10/01/2020 0645 Gross per 24 hour  Intake --  Output 450 ml  Net -450 ml   Filed Weights   09/28/2020 0700  Weight: 47.1 kg    Examination: General: elderly female, mild respiratory distress  HENT: Dry MM  Lungs: Diminished to right, clear to left, mild use of accessory muscles  Cardiovascular: Tachy, no MRG Abdomen: Soft, non-distended, active bowel sounds  Extremities: -edema Neuro: alert, oriented, follows  commands  GU: intact   Resolved Hospital Problem list     Assessment & Plan:   Acute Hypoxic Respiratory Failure with Right Lobe HCAP Plan -Placed on Heated High Flow with oxygen saturation goal >92 -Continue Cefepime/Vancomycin, Follow Culture Data  -Encourage good pulmonary hygiene   Right Lobe Subsegmental PE  -Vas [name] negative for DVT  -ECHO with EF 60-65, small pericardial effusion  Plan -Continue Heparin  Pseudomonas UTI Plan -Continue Cefepime   Breast CA, Followed by Dr. [name] -patient refused mastectomy  Plan -Continue Arimidex  -Hold Ibrance given infectious state   Anemia of Chronic Disease  Plan -Trend CBC   Best Practice (right click and "Reselect all SmartList Selections" daily)   Diet/type: NPO DVT prophylaxis: systemic heparin GI prophylaxis: PPI Lines: N/A Foley:  N/A Code Status:  full code Last date of multidisciplinary goals of care discussion: Son updated on plan. Verifies full code status.   Labs   CBC: Recent Labs  Lab 09/15/2020 0120 09/30/20 0028 10/01/20 0118  WBC 20.3* 19.0* 16.8*  NEUTROABS 18.0*  --  15.0*  HGB 10.1* 8.8* 8.9*  HCT 31.3* 26.9* 27.2*  MCV 94.0 93.4 93.2  PLT 217 206 [id number]    Basic Metabolic Panel: Recent Labs  Lab 10/04/2020 0120 10/01/20 0118  NA 132* 133*  K 4.4 4.0  CL 96* 103  CO2 26 24  GLUCOSE 187* 148*  BUN 17 15  CREATININE 0.80 0.69  CALCIUM 8.8* 8.1*  MG  --  1.7  PHOS  --  2.2*   GFR: Estimated Creatinine Clearance: 36.1  mL/min (by C-G formula based on SCr of 0.69 mg/dL). Recent Labs  Lab 09/30/2020 0120 09/14/2020 0324 09/15/2020 0510 09/30/20 0028 10/01/20 0118  WBC 20.3*  --   --  19.0* 16.8*  LATICACIDVEN  --  1.5 1.9  --   --     Liver Function Tests: Recent Labs  Lab 09/16/2020 0120  AST 46*  ALT 61*  ALKPHOS 78  BILITOT 0.8  PROT 6.3*  ALBUMIN 2.5*   No results for input(s): LIPASE, AMYLASE in the last 168 hours. No results for input(s): AMMONIA in the last 168  hours.  ABG    Component Value Date/Time   PHART 7.428 10/01/2020 1520   PCO2ART 31.7 (L) 10/01/2020 1520   PO2ART 50.7 (L) 10/01/2020 1520   HCO3 20.7 10/01/2020 1520   TCO2 27 08/27/2007 2257   ACIDBASEDEF 3.0 (H) 10/01/2020 1520   O2SAT 86.1 10/01/2020 1520     Coagulation Profile: No results for input(s): INR, PROTIME in the last 168 hours.  Cardiac Enzymes: No results for input(s): CKTOTAL, CKMB, CKMBINDEX, TROPONINI in the last 168 hours.  HbA1C: Hgb A1c MFr Bld  Date/Time Value Ref Range Status  07/12/2014 06:45 AM 6.4 (H) 4.8 - 5.6 % Final    Comment:    (NOTE)         Pre-diabetes: 5.7 - 6.4         Diabetes: >6.4         Glycemic control for adults with diabetes: <7.0     CBG: No results for input(s): GLUCAP in the last 168 hours.  Review of Systems:   +SOB, Denies nausea/vomiting   Past Medical History:  She,  has a past medical history of Arthritis, Cancer ([place]), Dyspnea, GERD (gastroesophageal reflux disease), Hypertension, and Stroke ([place]).   Surgical History:   Past Surgical History:  Procedure Laterality Date   ABDOMINAL HYSTERECTOMY     APPENDECTOMY     SHOULDER SURGERY     Right     Social History:   reports that she has never smoked. She has never used smokeless tobacco. She reports that she does not drink alcohol and does not use drugs.   Family History:  Her family history is not on file. She was adopted.   Allergies No Known Allergies   Home Medications  Prior to Admission medications   Medication Sig Start Date End Date Taking? Authorizing Provider  acetaminophen (TYLENOL) 500 MG tablet Take 500-1,000 mg by mouth every 6 (six) hours as needed for mild pain or headache.   Yes [provider]  anastrozole (ARIMIDEX) 1 MG tablet Take 1 tablet (1 mg total) by mouth daily. 06/26/20  Yes Magrinat, Virgie Dad, MD  Calcium Carb-Cholecalciferol (CALCIUM 600+D3) 600-800 MG-UNIT TABS Take 2 tablets by mouth in the morning.   Yes  [provider]  feeding supplement (ENSURE ENLIVE / ENSURE PLUS) LIQD Take 237 mLs by mouth 2 (two) times daily between meals. 09/16/20 10/16/20 Yes Mercy Riding, MD  mirtazapine (REMERON) 15 MG tablet Take 1 tablet (15 mg total) by mouth at bedtime. 09/16/20  Yes Mercy Riding, MD  amLODipine (NORVASC) 5 MG tablet Take 1 tablet (5 mg total) by mouth daily. Patient not taking: No sig reported 09/16/20 11/15/20  Mercy Riding, MD  Multiple Vitamin (MULTIVITAMIN WITH MINERALS) TABS tablet Take 1 tablet by mouth daily. Patient not taking: Reported on 09/21/2020 09/16/20   Mercy Riding, MD  predniSONE (DELTASONE) 20 MG tablet Take 2 tablets (  40 mg total) by mouth daily with breakfast. 09/30/20   Magrinat, Virgie Dad, MD  triamcinolone ointment (KENALOG) 0.5 % Apply 1 application topically 2 (two) times daily. Patient not taking: Reported on 10/03/2020 12/27/19   Gardenia Phlegm, NP     Critical care time: 42 minutes     Hayden Pedro, AGACNP-BC [facility]  PCCM Pgr: [phone]    PCCM Attending:   87 yo FM, admitted for sob and RUL subsegmental PE. She has developed worsneing hypoxemia. CXR completed this AM with Right lung worsening infiltrate. No clear aspiration event.   BP (!) 137/58   Pulse (!) 106   Temp 97.7 F (36.5 C) (Axillary)   Resp 14   Ht '5\' 1"'$  (1.549 m)   Wt 47.1 kg   SpO2 90%   BMI 19.62 kg/m   Gen: elderly frail FM HENT: NCAT, tracking  Heart: Regular, tachy, s1 s2 Lungs: right lung rhonchi, left clear Abd: soft nt nd   Labs reviewed  CXR: right lung consolidation, small effusion   A:  AHRF Right sided pneumonia  Sub segmental PE  H/o breast cancer Pseudomonal pneumonia  Full code  P:  Moved from NRB to [facility]  Discussion with family regarding next steps which would likely be intubation  There are ok with proceeding if needed  Continue Abx  Move to ICU  Continue heparin ggt   This patient is critically ill with  multiple organ system failure; which, requires frequent high complexity decision making, assessment, support, evaluation, and titration of therapies. This was completed through the application of advanced monitoring technologies and extensive interpretation of multiple databases. During this encounter critical care time was devoted to patient care services described in this note for 45 minutes.   [facility] 10/01/2020 4:42 PM

## 2020-10-01 NOTE — Progress Notes (Signed)
OT Cancellation Note  Patient Details Name: Michelle [name] MRN: [id number] DOB: May 17, 1930   Cancelled Treatment:    Reason Eval/Treat Not Completed: Medical issues which prohibited therapy. Pt tachycardic this morning, noted to have chest pain overnight. Plan to reattempt at a later date.   Tyrone Schimke, OT Acute Rehabilitation Services Pager: [phone] Office: [phone]  10/01/2020, 10:03 AM

## 2020-10-01 NOTE — Progress Notes (Signed)
  Pt just had an  incident of chest pain with hr 122, shes resting now , however o2 was 85-87 ,o2 increased to 6l via nasal cannula  went up tp 94  just now, resp to come and review. hr now 94 Pts  v/s now bp 121/43 p97 , switched to HF O2 @ 6l.

## 2020-10-02 ENCOUNTER — Inpatient Hospital Stay (HOSPITAL_COMMUNITY): Payer: Medicare HMO

## 2020-10-02 DIAGNOSIS — J9601 Acute respiratory failure with hypoxia: Secondary | ICD-10-CM | POA: Diagnosis not present

## 2020-10-02 DIAGNOSIS — J189 Pneumonia, unspecified organism: Secondary | ICD-10-CM

## 2020-10-02 DIAGNOSIS — I2699 Other pulmonary embolism without acute cor pulmonale: Secondary | ICD-10-CM | POA: Diagnosis not present

## 2020-10-02 LAB — CBC
HCT: 23 % — ABNORMAL LOW (ref 36.0–46.0)
Hemoglobin: 7.4 g/dL — ABNORMAL LOW (ref 12.0–15.0)
MCH: 30 pg (ref 26.0–34.0)
MCHC: 32.2 g/dL (ref 30.0–36.0)
MCV: 93.1 fL (ref 80.0–100.0)
Platelets: 241 10*3/uL (ref 150–400)
RBC: 2.47 MIL/uL — ABNORMAL LOW (ref 3.87–5.11)
RDW: 17.5 % — ABNORMAL HIGH (ref 11.5–15.5)
WBC: 16.1 10*3/uL — ABNORMAL HIGH (ref 4.0–10.5)
nRBC: 0 % (ref 0.0–0.2)

## 2020-10-02 LAB — MRSA NEXT GEN BY PCR, NASAL: MRSA by PCR Next Gen: NOT DETECTED

## 2020-10-02 LAB — CBC WITH DIFFERENTIAL/PLATELET
Abs Immature Granulocytes: 0.17 10*3/uL — ABNORMAL HIGH (ref 0.00–0.07)
Basophils Absolute: 0 10*3/uL (ref 0.0–0.1)
Basophils Relative: 0 %
Eosinophils Absolute: 0 10*3/uL (ref 0.0–0.5)
Eosinophils Relative: 0 %
HCT: 22.7 % — ABNORMAL LOW (ref 36.0–46.0)
Hemoglobin: 7.4 g/dL — ABNORMAL LOW (ref 12.0–15.0)
Immature Granulocytes: 1 %
Lymphocytes Relative: 11 %
Lymphs Abs: 1.7 10*3/uL (ref 0.7–4.0)
MCH: 30.6 pg (ref 26.0–34.0)
MCHC: 32.6 g/dL (ref 30.0–36.0)
MCV: 93.8 fL (ref 80.0–100.0)
Monocytes Absolute: 1.2 10*3/uL — ABNORMAL HIGH (ref 0.1–1.0)
Monocytes Relative: 8 %
Neutro Abs: 12.1 10*3/uL — ABNORMAL HIGH (ref 1.7–7.7)
Neutrophils Relative %: 80 %
Platelets: 216 10*3/uL (ref 150–400)
RBC: 2.42 MIL/uL — ABNORMAL LOW (ref 3.87–5.11)
RDW: 17.8 % — ABNORMAL HIGH (ref 11.5–15.5)
WBC: 15.2 10*3/uL — ABNORMAL HIGH (ref 4.0–10.5)
nRBC: 0.1 % (ref 0.0–0.2)

## 2020-10-02 LAB — PHOSPHORUS: Phosphorus: 2.5 mg/dL (ref 2.5–4.6)

## 2020-10-02 LAB — GLUCOSE, CAPILLARY
Glucose-Capillary: 204 mg/dL — ABNORMAL HIGH (ref 70–99)
Glucose-Capillary: 126 mg/dL — ABNORMAL HIGH (ref 70–99)
Glucose-Capillary: 130 mg/dL — ABNORMAL HIGH (ref 70–99)
Glucose-Capillary: 124 mg/dL — ABNORMAL HIGH (ref 70–99)
Glucose-Capillary: 223 mg/dL — ABNORMAL HIGH (ref 70–99)
Glucose-Capillary: 295 mg/dL — ABNORMAL HIGH (ref 70–99)

## 2020-10-02 LAB — BASIC METABOLIC PANEL
Anion gap: 6 (ref 5–15)
BUN: 20 mg/dL (ref 8–23)
CO2: 23 mmol/L (ref 22–32)
Calcium: 8.1 mg/dL — ABNORMAL LOW (ref 8.9–10.3)
Chloride: 106 mmol/L (ref 98–111)
Creatinine, Ser: 0.85 mg/dL (ref 0.44–1.00)
GFR, Estimated: >60 mL/min (ref >60)
Glucose, Bld: 128 mg/dL — ABNORMAL HIGH (ref 70–99)
Potassium: 4 mmol/L (ref 3.5–5.1)
Sodium: 135 mmol/L (ref 135–145)

## 2020-10-02 LAB — POCT I-STAT 7, (LYTES, BLD GAS, ICA,H+H)
Acid-Base Excess: 3 mmol/L — ABNORMAL HIGH (ref 0.0–2.0)
Bicarbonate: 26.9 mmol/L (ref 20.0–28.0)
Calcium, Ion: 1.17 mmol/L (ref 1.15–1.40)
HCT: 22 % — ABNORMAL LOW (ref 36.0–46.0)
Hemoglobin: 7.5 g/dL — ABNORMAL LOW (ref 12.0–15.0)
O2 Saturation: 97 %
Patient temperature: 97.4
Potassium: 3.9 mmol/L (ref 3.5–5.1)
Sodium: 137 mmol/L (ref 135–145)
TCO2: 28 mmol/L (ref 22–32)
pCO2 arterial: 34.1 mmHg (ref 32.0–48.0)
pH, Arterial: 7.503 — ABNORMAL HIGH (ref 7.350–7.450)
pO2, Arterial: 81 mmHg — ABNORMAL LOW (ref 83.0–108.0)

## 2020-10-02 LAB — PROCALCITONIN: Procalcitonin: 0.37 ng/mL

## 2020-10-02 LAB — APTT: aPTT: 96 seconds — ABNORMAL HIGH (ref 24–36)

## 2020-10-02 LAB — MAGNESIUM: Magnesium: 1.9 mg/dL (ref 1.7–2.4)

## 2020-10-02 MED ORDER — ETOMIDATE 2 MG/ML IV SOLN
INTRAVENOUS | Status: AC
  Filled 2020-10-02: qty 20

## 2020-10-02 MED ORDER — FENTANYL CITRATE (PF) 100 MCG/2ML IJ SOLN
INTRAMUSCULAR | Status: AC
  Filled 2020-10-02: qty 2

## 2020-10-02 MED ORDER — MIDAZOLAM HCL 2 MG/2ML IJ SOLN
INTRAMUSCULAR | Status: AC
  Filled 2020-10-02: qty 2

## 2020-10-02 MED ORDER — INSULIN ASPART 100 UNIT/ML IJ SOLN
0.0000 [IU] | Freq: Every day | INTRAMUSCULAR | Status: DC
  Administered 2020-10-03: 3 [IU] via SUBCUTANEOUS
  Administered 2020-10-04: 2 [IU] via SUBCUTANEOUS

## 2020-10-02 MED ORDER — APIXABAN 5 MG PO TABS
10.0000 mg | ORAL_TABLET | Freq: Two times a day (BID) | ORAL | Status: DC
  Administered 2020-10-02 – 2020-10-04 (×5): 10 mg via ORAL
  Filled 2020-10-02 (×6): qty 2

## 2020-10-02 MED ORDER — INSULIN ASPART 100 UNIT/ML IJ SOLN
0.0000 [IU] | Freq: Three times a day (TID) | INTRAMUSCULAR | Status: DC
  Administered 2020-10-03: 2 [IU] via SUBCUTANEOUS
  Administered 2020-10-03: 3 [IU] via SUBCUTANEOUS
  Administered 2020-10-04 – 2020-10-05 (×3): 2 [IU] via SUBCUTANEOUS
  Administered 2020-10-05: 3 [IU] via SUBCUTANEOUS
  Administered 2020-10-06: 2 [IU] via SUBCUTANEOUS
  Administered 2020-10-06: 3 [IU] via SUBCUTANEOUS
  Administered 2020-10-07 (×2): 2 [IU] via SUBCUTANEOUS
  Administered 2020-10-08: 3 [IU] via SUBCUTANEOUS
  Administered 2020-10-08 (×2): 2 [IU] via SUBCUTANEOUS

## 2020-10-02 MED ORDER — ROCURONIUM BROMIDE 10 MG/ML (PF) SYRINGE
PREFILLED_SYRINGE | INTRAVENOUS | Status: AC
  Filled 2020-10-02: qty 10

## 2020-10-02 MED ORDER — APIXABAN 5 MG PO TABS: 5.0000 mg | ORAL_TABLET | Freq: Two times a day (BID) | ORAL | Status: DC

## 2020-10-02 MED ORDER — KETAMINE HCL 50 MG/5ML IJ SOSY
PREFILLED_SYRINGE | INTRAMUSCULAR | Status: AC
  Filled 2020-10-02: qty 5

## 2020-10-02 NOTE — Progress Notes (Signed)
PT Cancellation Note  Patient Details Name: Michelle [name] MRN: [id number] DOB: [birth year]   Cancelled Treatment:    Reason Eval/Treat Not Completed: Medical issues which prohibited therapy (Patient on HFNC 80% FiO2. RN with request to hold PT at this time given concern for decompensation.)   Alvira Philips 10/02/2020, 8:40 AM Tayja Manzer M,PT Acute Rehab Services [phone] (pager)

## 2020-10-02 NOTE — Progress Notes (Signed)
ABG sample collected, showed venous result. ABG recollected. POC edit sheet sent.

## 2020-10-02 NOTE — Progress Notes (Signed)
E Link called orders received for repeat ABG, Pt currently back on 90% HFNC 25L.  Pt is attempting to follow commands after frequent reorientation,

## 2020-10-02 NOTE — Progress Notes (Signed)
[facility] Progress Note Patient Name: Michelle [name] DOB: 06-Aug-1930 MRN: [id number]   Date of Service  10/02/2020  HPI/Events of Note  Hypoxia - Sats in mid to high 80's and RR = low to mid 30's. Placed on NRB in additional to [place] and Sat now - 94% and RR = 18.  eICU Interventions  Plan: Incentive spirometry Q 1 hour while awake.  Will request PCCM ground team evaluate her at bedside.      Intervention Category Major Interventions: Hypoxemia - evaluation and management  Lysle Dingwall 10/02/2020, 7:27 PM

## 2020-10-02 NOTE — Progress Notes (Signed)
[facility] Progress Note Patient Name: Michelle [name] DOB: 1930-04-27 MRN: [id number]   Date of Service  10/02/2020  HPI/Events of Note  Hyperglycemia - Blood glucose - 223 --> 295. Patient is on a PO diet + Ensure.  eICU Interventions  Plan: AC/HS moderate Novolog SSI.     Intervention Category Major Interventions: Hyperglycemia - active titration of insulin therapy  Lysle Dingwall 10/02/2020, 11:36 PM

## 2020-10-02 NOTE — Evaluation (Signed)
Clinical/Bedside Swallow Evaluation Patient Details  Name: TOPEKA HAMBLETT MRN: [id number] Date of Birth: March 09, 1930  Today's Date: 10/02/2020 Time: SLP Start Time (ACUTE ONLY): 1053 SLP Stop Time (ACUTE ONLY): 71 SLP Time Calculation (min) (ACUTE ONLY): 30 min  Past Medical History:  Past Medical History:  Diagnosis Date   Arthritis    Cancer ([place])    Bilaterial Breast Cancer   Dyspnea    GERD (gastroesophageal reflux disease)    Hypertension    Stroke ([place])    "mini stroke" per patient   Past Surgical History:  Past Surgical History:  Procedure Laterality Date   ABDOMINAL HYSTERECTOMY     APPENDECTOMY     SHOULDER SURGERY     Right   HPI:  87 year old female presented to ED on 8/16 with chest pain. Dx RUL segmental PE, acute hypoxic respiratory failure, suspected RLL pna. CXR 8/18 with worsening infiltrate right lung.  Worsening resp status, requiring HHFNC and transferred to ICU. PMHx hypertension, breast cancer, GERD. Recent hospitalization 7/25-8/4 for sepsis, bacteremia and acute ITP.   Assessment / Plan / Recommendation Clinical Impression  Pt participated in clinical swallowing assessment.  She completed her own oral care with assist.  She remains on HHFNC at 70%.  Despite high 02 needs, she demonstrated quite functional swallow with no clinical s/s of aspiration.  She drank ~six ounce of thin liquids without difficulty.  RR remained within range that allows adequate synchrony of swallow/respiratory cycles. Oral preparation and control of solids was WNL.  Pt observed taking her meds with liquid and tolerated well.   Recommend that pt continue current diet - regular solids, thin liquids.  No dysphagia identified. Our service will sign off. D/W pt, RN.  SLP Visit Diagnosis: Dysphagia, unspecified (R13.10)    Aspiration Risk       Diet Recommendation   Continue regular solids, thin liquids  Medication Administration: Whole meds with liquid    Other  Recommendations  Oral Care Recommendations: Oral care BID   Follow up Recommendations None        Swallow Study   General Date of Onset: 09/16/2020 HPI: 87 year old female presented to ED on 8/16 with chest pain. Dx RUL segmental PE, acute hypoxic respiratory failure, suspected RLL pna. CXR 8/18 with worsening infiltrate right lung.  Worsening resp status, requiring HHFNC and transferred to ICU. PMHx hypertension, breast cancer, GERD. Recent hospitalization 7/25-8/4 for sepsis, bacteremia and acute ITP. Type of Study: Bedside Swallow Evaluation Diet Prior to this Study: Regular;Thin liquids Temperature Spikes Noted: No Respiratory Status: Other (comment) (HHFNC) History of Recent Intubation: No Behavior/Cognition: Alert;Cooperative Oral Cavity Assessment: Within Functional Limits Oral Care Completed by SLP: Yes Oral Cavity - Dentition: Missing dentition Vision: Functional for self-feeding Self-Feeding Abilities: Able to feed self Patient Positioning: Upright in bed Baseline Vocal Quality: Normal Volitional Cough: Strong Volitional Swallow: Able to elicit    Oral/Motor/Sensory Function Overall Oral Motor/Sensory Function: Within functional limits   Ice Chips Ice chips: Within functional limits   Thin Liquid Thin Liquid: Within functional limits    Nectar Thick Nectar Thick Liquid: Not tested   Honey Thick Honey Thick Liquid: Not tested   Puree Puree: Within functional limits   Solid     Solid: Not tested (declined)     Leonidas Boateng L. Tivis Ringer, [place] Office number [phone] Pager [phone]  Juan Quam Laurice 10/02/2020,11:31 AM

## 2020-10-02 NOTE — Progress Notes (Signed)
OT Cancellation Note  Patient Details Name: Michelle [name] MRN: [id number] DOB: 30-Nov-1930   Cancelled Treatment:    Reason Eval/Treat Not Completed: Medical issues which prohibited therapy. Patient on HFNC 80% FiO2. RN with request to hold OT at this time given concern for decompensation. Will check back as time allows.    Gloris Manchester OTR/L Supplemental OT, Department of rehab services [phone]  Molina Hollenback R H. 10/02/2020, 7:23 AM

## 2020-10-02 NOTE — Plan of Care (Signed)

## 2020-10-02 NOTE — Progress Notes (Signed)
Pt grunting and talking to self, O2 sats 87%, pt pulling oxygen off and removing gown, and leads, oxygen increase from 60% to 70% .  Pt voices nothing is wrong. Attemping to reorient  patient

## 2020-10-02 NOTE — Progress Notes (Signed)
NAME:  Michelle [name], MRN:  [id number], DOB:  04/26/1930, LOS: 3 ADMISSION DATE:  10/07/2020, CONSULTATION DATE:  10/01/2020 REFERRING MD:  Dr. Sloan Leiter, CHIEF COMPLAINT:  Respiratory Distress    History of Present Illness:  87 year old female presents to ED on 8/16 with chest pain. CXR with small right sided pleural effusion, CT chest with single subsegmental pulmonary artery defect of right upper lobe, started on heparin gtt. 8/17 patient with increased oxygen requirements, on 8/18 continued with progressive hypoxia with oxygen saturation 88% on NRB with tachycardia. U/A +pseudomonas. Critical Care Consulted. CXR with noted increasing right lobe infiltrate. Placed on Cefepime/Vancomycin. Transferred to ICU for further work-up.   Of note patient with recent admission 7/25-8/4 with sepsis secondary to aeroccus urinae bacteremia and UTI with acute ITP. Treated with Rocephin, then Ampicillin, discharged on amoxicillin for 13 days.   Pertinent  Medical History  HTN, Breast CA on ibrance, GERD  Significant Hospital Events: Including procedures, antibiotic start and stop dates in addition to other pertinent events   8/16 > admitted to ED 8/18 > transferred to ICU   Interim History / Subjective:  O/N: When weaned, patient would become hypoxic and altered.   Michelle [name] states she is doing well this morning, she states she slept well last night. She denies nausea, vomiting, or febrile events. She states that her appetite is well.   Objective   Blood pressure (!) 115/53, pulse (!) 113, temperature (!) 97.4 F (36.3 C), temperature source Oral, resp. rate (!) 26, height '5\' 1"'$  (1.549 m), weight 47.1 kg, SpO2 99 %.    FiO2 (%):  [50 %-100 %] 90 %   Intake/Output Summary (Last 24 hours) at 10/02/2020 1203 Last data filed at 10/02/2020 1000 Gross per 24 hour  Intake 2063.35 ml  Output 550 ml  Net 1513.35 ml   Filed Weights   10/03/2020 0700  Weight: 47.1 kg    Examination: Physical  Exam Constitutional:      Comments: NAD, answering questions appropriately.   Cardiovascular:     Rate and Rhythm: Normal rate and regular rhythm.     Pulses: Normal pulses.     Heart sounds: Normal heart sounds. No murmur heard.   No friction rub. No gallop.  Pulmonary:     Comments: Decreased BS in the R mid and upper lobes, CTA on L lung. Saturating at 96% on HFNC Abdominal:     General: Bowel sounds are normal.     Tenderness: There is no abdominal tenderness. There is no guarding.  Musculoskeletal:     Right lower leg: No edema.     Left lower leg: No edema.  Neurological:     Mental Status: She is alert and oriented to person, place, and time.     Comments: AAOx3, moving all limbs spontaneously.   Psychiatric:        Mood and Affect: Mood normal.        Behavior: Behavior normal.     Resolved Hospital Problem list     Assessment & Plan:   Acute Hypoxic Respiratory Failure with Right Lobe HCAP:  Attempted several times O/N to wean patient O2, but patient would have increased AMS with hypoxia. MRSA swab negative this AM. BC NGTD x3 - Continue Heated High Flow with oxygen saturation goal >92 - Discontinue Vanc - Continue Cefepime - Follow Culture Data  - Encourage good pulmonary hygiene   R. Lobe Subsegmental Pulmonary Embolism:  DVT studies were negative and echo  showing EF of 60-65% with small R lobe, subsegmental pulmonary embolism.  -D/C Heparin - Restart Eliquis 10 mg BID for 7 days, followed by 5 mg BID   Pseudomonas UTI: Urinalysis growing pan sensitive pseudomonas.  - Continue Cefepime  Breast CA: Follows with Dr. Jana [name] OP, refused mastectomy.  - Continue Arimidex  - Hold Ibrance in setting of infection   Anemia of Chronic Disease  Hgb dropped to 7.4 this AM from  9.1 yesterday. No overt signs of blood loss. Heparin was discontinued this AM with initiation of Eliquis. Will trend CBC and transfuse with goal of Hgb >7.  - PM CBC -Trend CBC  Best  Practice (right click and "Reselect all SmartList Selections" daily)   Diet/type: NPO DVT prophylaxis: systemic heparin GI prophylaxis: PPI Lines: N/A Foley:  N/A Code Status:  full code Last date of multidisciplinary goals of care discussion: Son updated on plan. Verifies full code status.   Labs   CBC: Recent Labs  Lab 09/26/2020 0120 09/30/20 0028 10/01/20 0118 10/01/20 1540 10/02/20 0249 10/02/20 0307 10/02/20 0546  WBC 20.3* 19.0* 16.8* 19.5*  --   --  15.2*  NEUTROABS 18.0*  --  15.0* 17.5*  --   --  12.1*  HGB 10.1* 8.8* 8.9* 9.1* 5.4* 7.5* 7.4*  HCT 31.3* 26.9* 27.2* 28.0* 16.0* 22.0* 22.7*  MCV 94.0 93.4 93.2 93.6  --   --  93.8  PLT 217 206 221 260  --   --  [id number]    Basic Metabolic Panel: Recent Labs  Lab 10/01/2020 0120 10/01/20 0118 10/02/20 0249 10/02/20 0307 10/02/20 0546  NA 132* 133* 136 137 135  K 4.4 4.0 3.6 3.9 4.0  CL 96* 103  --   --  106  CO2 26 24  --   --  23  GLUCOSE 187* 148*  --   --  128*  BUN 17 15  --   --  20  CREATININE 0.80 0.69  --   --  0.85  CALCIUM 8.8* 8.1*  --   --  8.1*  MG  --  1.7  --   --  1.9  PHOS  --  2.2*  --   --  2.5   GFR: Estimated Creatinine Clearance: 34 mL/min (by C-G formula based on SCr of 0.85 mg/dL). Recent Labs  Lab 09/23/2020 0324 09/23/2020 0510 09/30/20 0028 10/01/20 0118 10/01/20 1540 10/01/20 1847 10/02/20 0546  PROCALCITON  --   --   --   --  0.30  --  0.37  WBC  --   --  19.0* 16.8* 19.5*  --  15.2*  LATICACIDVEN 1.5 1.9  --   --  2.2* 1.5  --     Liver Function Tests: Recent Labs  Lab 09/22/2020 0120  AST 46*  ALT 61*  ALKPHOS 78  BILITOT 0.8  PROT 6.3*  ALBUMIN 2.5*   No results for input(s): LIPASE, AMYLASE in the last 168 hours. No results for input(s): AMMONIA in the last 168 hours.  ABG    Component Value Date/Time   PHART 7.503 (H) 10/02/2020 0307   PCO2ART 34.1 10/02/2020 0307   PO2ART 81 (L) 10/02/2020 0307   HCO3 26.9 10/02/2020 0307   TCO2 28 10/02/2020 0307    ACIDBASEDEF 3.0 (H) 10/01/2020 1520   O2SAT 97.0 10/02/2020 0307     Coagulation Profile: No results for input(s): INR, PROTIME in the last 168 hours.  Cardiac Enzymes: No results for input(s): CKTOTAL, CKMB, CKMBINDEX,  TROPONINI in the last 168 hours.  HbA1C: Hgb A1c MFr Bld  Date/Time Value Ref Range Status  07/12/2014 06:45 AM 6.4 (H) 4.8 - 5.6 % Final    Comment:    (NOTE)         Pre-diabetes: 5.7 - 6.4         Diabetes: >6.4         Glycemic control for adults with diabetes: <7.0     CBG: Recent Labs  Lab 10/01/20 2324 10/02/20 0324 10/02/20 0715 10/02/20 1121  GLUCAP 140* 126* 130* 124*    Review of Systems:   Negative with exception to above.   Past Medical History:  She,  has a past medical history of Arthritis, Cancer ([place]), Dyspnea, GERD (gastroesophageal reflux disease), Hypertension, and Stroke ([place]).   Surgical History:   Past Surgical History:  Procedure Laterality Date   ABDOMINAL HYSTERECTOMY     APPENDECTOMY     SHOULDER SURGERY     Right     Social History:   reports that she has never smoked. She has never used smokeless tobacco. She reports that she does not drink alcohol and does not use drugs.   Family History:  Her family history is not on file. She was adopted.   Allergies No Known Allergies   Home Medications  Prior to Admission medications   Medication Sig Start Date End Date Taking? Authorizing Provider  acetaminophen (TYLENOL) 500 MG tablet Take 500-1,000 mg by mouth every 6 (six) hours as needed for mild pain or headache.   Yes [provider]  anastrozole (ARIMIDEX) 1 MG tablet Take 1 tablet (1 mg total) by mouth daily. 06/26/20  Yes Magrinat, Virgie Dad, MD  Calcium Carb-Cholecalciferol (CALCIUM 600+D3) 600-800 MG-UNIT TABS Take 2 tablets by mouth in the morning.   Yes [provider]  feeding supplement (ENSURE ENLIVE / ENSURE PLUS) LIQD Take 237 mLs by mouth 2 (two) times daily between meals. 09/16/20 10/16/20  Yes Mercy Riding, MD  mirtazapine (REMERON) 15 MG tablet Take 1 tablet (15 mg total) by mouth at bedtime. 09/16/20  Yes Mercy Riding, MD  amLODipine (NORVASC) 5 MG tablet Take 1 tablet (5 mg total) by mouth daily. Patient not taking: No sig reported 09/16/20 11/15/20  Mercy Riding, MD  Multiple Vitamin (MULTIVITAMIN WITH MINERALS) TABS tablet Take 1 tablet by mouth daily. Patient not taking: Reported on 09/20/2020 09/16/20   Mercy Riding, MD  predniSONE (DELTASONE) 20 MG tablet Take 2 tablets (40 mg total) by mouth daily with breakfast. 09/30/20   Magrinat, Virgie Dad, MD  triamcinolone ointment (KENALOG) 0.5 % Apply 1 application topically 2 (two) times daily. Patient not taking: Reported on 10/13/2020 12/27/19   Gardenia Phlegm, NP    Maudie Mercury, MD IMTS, PGY-3 Pager: [phone] 10/02/2020,12:03 PM

## 2020-10-02 NOTE — Progress Notes (Addendum)
[place] for Heparin >> Apixaban Indication: pulmonary embolus  No Known Allergies  Patient Measurements: ~50 kg  Vital Signs: Temp: 97.4 F (36.3 C) (08/19 0329) Temp Source: Oral (08/19 0329) BP: 133/55 (08/19 0726) Pulse Rate: 116 (08/19 0726)  Labs: Recent Labs    09/22/2020 1113 10/10/2020 1620 09/30/20 0028 09/30/20 0028 10/01/20 0118 10/01/20 0447 10/01/20 1540 10/01/20 1647 10/02/20 0249 10/02/20 0307 10/02/20 0546  HGB  --   --  8.8*   < > 8.9*  --  9.1*  --  5.4* 7.5* 7.4*  HCT  --   --  26.9*   < > 27.2*  --  28.0*  --  16.0* 22.0* 22.7*  PLT  --   --  206   < > 221  --  260  --   --   --  216  APTT  --   --   --   --   --   --   --  77*  --   --  96*  HEPARINUNFRC  --  0.49 0.55  --   --   --   --   --   --   --   --   CREATININE  --   --   --   --  0.69  --   --   --   --   --  0.85  TROPONINIHS 576*  --   --   --  985* 987*  --   --   --   --   --    < > = values in this interval not displayed.     Estimated Creatinine Clearance: 34 mL/min (by C-G formula based on SCr of 0.85 mg/dL).   Medical History: Past Medical History:  Diagnosis Date   Arthritis    Cancer ([place])    Bilaterial Breast Cancer   Dyspnea    GERD (gastroesophageal reflux disease)    Hypertension    Stroke [facility])    "mini stroke" per patient     Assessment: 87 y/o F with history of breast cancer, found to have small PE 10/01/2020. No anticoagulation prior to admission. Pharmacy consulted for heparin.     aPTT this morning is therapeutic (aPTT 96 << 77, goal of 66-102). Hgb 7.4 << 9.1,  plts wnl. No bleeding noted per RN, CCM okay to cont, will repeat CBC this afternoon.   Goal of Therapy:  Heparin level 0.3-0.7 units/ml, aPTT 66-102 sec Monitor platelets by anticoagulation protocol: Yes   Plan:   - D/c Heparin - Restart Apixaban 10 mg bid x 7d followed by 5 mg bid - AVS in chart, education done 8/17 - Will monitor for bleeding, any  other dose adjustments  Thank you for allowing pharmacy to be a part of this patient's care.  Alycia Rossetti, PharmD, BCPS Clinical Pharmacist Clinical phone for 10/02/2020: [phone] 10/02/2020 9:24 AM   **Pharmacist phone directory can now be found on [email or web address] ([email or web address]).  Listed under [place].

## 2020-10-03 DIAGNOSIS — J189 Pneumonia, unspecified organism: Secondary | ICD-10-CM | POA: Diagnosis not present

## 2020-10-03 DIAGNOSIS — J9601 Acute respiratory failure with hypoxia: Secondary | ICD-10-CM | POA: Diagnosis not present

## 2020-10-03 DIAGNOSIS — I2699 Other pulmonary embolism without acute cor pulmonale: Secondary | ICD-10-CM | POA: Diagnosis not present

## 2020-10-03 LAB — CBC WITH DIFFERENTIAL/PLATELET
Abs Immature Granulocytes: 0.3 10*3/uL — ABNORMAL HIGH (ref 0.00–0.07)
Basophils Absolute: 0 10*3/uL (ref 0.0–0.1)
Basophils Relative: 0 %
Eosinophils Absolute: 0 10*3/uL (ref 0.0–0.5)
Eosinophils Relative: 0 %
HCT: 25.2 % — ABNORMAL LOW (ref 36.0–46.0)
Hemoglobin: 8.2 g/dL — ABNORMAL LOW (ref 12.0–15.0)
Immature Granulocytes: 2 %
Lymphocytes Relative: 6 %
Lymphs Abs: 1.1 10*3/uL (ref 0.7–4.0)
MCH: 30.9 pg (ref 26.0–34.0)
MCHC: 32.5 g/dL (ref 30.0–36.0)
MCV: 95.1 fL (ref 80.0–100.0)
Monocytes Absolute: 1.1 10*3/uL — ABNORMAL HIGH (ref 0.1–1.0)
Monocytes Relative: 6 %
Neutro Abs: 15.7 10*3/uL — ABNORMAL HIGH (ref 1.7–7.7)
Neutrophils Relative %: 86 %
Platelets: 276 10*3/uL (ref 150–400)
RBC: 2.65 MIL/uL — ABNORMAL LOW (ref 3.87–5.11)
RDW: 17.9 % — ABNORMAL HIGH (ref 11.5–15.5)
WBC: 18.2 10*3/uL — ABNORMAL HIGH (ref 4.0–10.5)
nRBC: 0.2 % (ref 0.0–0.2)

## 2020-10-03 LAB — BASIC METABOLIC PANEL
Anion gap: 9 (ref 5–15)
BUN: 26 mg/dL — ABNORMAL HIGH (ref 8–23)
CO2: 21 mmol/L — ABNORMAL LOW (ref 22–32)
Calcium: 8.4 mg/dL — ABNORMAL LOW (ref 8.9–10.3)
Chloride: 101 mmol/L (ref 98–111)
Creatinine, Ser: 0.91 mg/dL (ref 0.44–1.00)
GFR, Estimated: >60 mL/min (ref >60)
Glucose, Bld: 259 mg/dL — ABNORMAL HIGH (ref 70–99)
Potassium: 4.4 mmol/L (ref 3.5–5.1)
Sodium: 131 mmol/L — ABNORMAL LOW (ref 135–145)

## 2020-10-03 LAB — HEMOGLOBIN A1C
Hgb A1c MFr Bld: 6.9 % — ABNORMAL HIGH (ref 4.8–5.6)
Mean Plasma Glucose: 151.33 mg/dL

## 2020-10-03 LAB — GLUCOSE, CAPILLARY
Glucose-Capillary: 132 mg/dL — ABNORMAL HIGH (ref 70–99)
Glucose-Capillary: 101 mg/dL — ABNORMAL HIGH (ref 70–99)
Glucose-Capillary: 156 mg/dL — ABNORMAL HIGH (ref 70–99)
Glucose-Capillary: 127 mg/dL — ABNORMAL HIGH (ref 70–99)
Glucose-Capillary: 147 mg/dL — ABNORMAL HIGH (ref 70–99)
Glucose-Capillary: 139 mg/dL — ABNORMAL HIGH (ref 70–99)
Glucose-Capillary: 136 mg/dL — ABNORMAL HIGH (ref 70–99)

## 2020-10-03 LAB — PROCALCITONIN: Procalcitonin: 0.29 ng/mL

## 2020-10-03 MED ORDER — ORAL CARE MOUTH RINSE
15.0000 mL | Freq: Two times a day (BID) | OROMUCOSAL | Status: DC
  Administered 2020-10-03 – 2020-10-10 (×11): 15 mL via OROMUCOSAL

## 2020-10-03 NOTE — Progress Notes (Signed)
NAME:  Michelle [name], MRN:  [id number], DOB:  1930-08-18, LOS: 4 ADMISSION DATE:  09/18/2020, CONSULTATION DATE:  10/01/2020 REFERRING MD:  Dr. Sloan Leiter, CHIEF COMPLAINT:  Respiratory Distress    History of Present Illness:  87 yr old female presented to ER with chest pain and dyspnea.  Found to have small PE and started on heparin gtt.  Urine culture positive for Pseudomonas.  Developed worsening hypoxia and tachycardia from HCAP and transferred to ICU.  She had admission in July 2022 for sepsis from Aerococcus bacteremia, and acute ITP.  Pertinent  Medical History  HTN, Breast CA on ibrance, GERD, CVA  Significant Hospital Events: Including procedures, antibiotic start and stop dates in addition to other pertinent events   8/16 admitted to ED 8/18 transferred to ICU   Interim History / Subjective:  Breathing feels okay.  Not having cough or sputum.  Denies chest pain or nausea.  Objective   Blood pressure (!) 108/44, pulse (!) 109, temperature 99.1 F (37.3 C), temperature source Axillary, resp. rate (!) 22, height '5\' 1"'$  (1.549 m), weight 47.1 kg, SpO2 99 %.    FiO2 (%):  [60 %-100 %] 100 %   Intake/Output Summary (Last 24 hours) at 10/03/2020 0955 Last data filed at 10/03/2020 0800 Gross per 24 hour  Intake 206.99 ml  Output 410 ml  Net -203.01 ml    Filed Weights   09/28/2020 0700  Weight: 47.1 kg    Examination:  General - alert Eyes - pupils reactive ENT - no sinus tenderness, no stridor Cardiac - regular rate/rhythm, no murmur Chest - b/l rhonchi Abdomen - soft, non tender, + bowel sounds Extremities - no cyanosis, clubbing, or edema Skin - no rashes Neuro - moves extremities, follows commands   Resolved Hospital Problem list   Elevated troponin from demand ischemia 2nd to PE  Assessment & Plan:   Acute hypoxic respiratory failure form HCAP and acute pulmonary embolism. - goal SpO2 > 92% - f/u CXR intermittently - continue eliquis - monitor need for  intubation in ICU  HCAP, Pseudomonal UTI. - day 3 of cefepime  Hx of breast cancer. - Follows with Dr. [name] outpt - Continue Arimidex  - Hold Ibrance in setting of infection   Anemia of critical illness and chronic disease. - f/u CBC - transfuse for Hb < 7  ITP. - continue prednisone  Steroid induced hyperglycemia. - SSI  Best Practice (right click and "Reselect all SmartList Selections" daily)  Diet/type: heart healthy DVT prophylaxis: eliquis GI prophylaxis: not indicated Code Status:  full code Last date of multidisciplinary goals of care discussion:    Labs    CMP Latest Ref Rng & Units 10/03/2020 10/02/2020 10/02/2020  Glucose 70 - 99 mg/dL 259(H) 128(H) -  BUN 8 - 23 mg/dL 26(H) 20 -  Creatinine 0.44 - 1.00 mg/dL 0.91 0.85 -  Sodium 135 - 145 mmol/L 131(L) 135 137  Potassium 3.5 - 5.1 mmol/L 4.4 4.0 3.9  Chloride 98 - 111 mmol/L 101 106 -  CO2 22 - 32 mmol/L 21(L) 23 -  Calcium 8.9 - 10.3 mg/dL 8.4(L) 8.1(L) -  Total Protein 6.5 - 8.1 g/dL - - -  Total Bilirubin 0.3 - 1.2 mg/dL - - -  Alkaline Phos 38 - 126 U/L - - -  AST 15 - 41 U/L - - -  ALT 0 - 44 U/L - - -    CBC Latest Ref Rng & Units 10/03/2020 10/02/2020 10/02/2020  WBC 4.0 -  10.5 K/uL 18.2(H) 16.1(H) 15.2(H)  Hemoglobin 12.0 - 15.0 g/dL 8.2(L) 7.4(L) 7.4(L)  Hematocrit 36.0 - 46.0 % 25.2(L) 23.0(L) 22.7(L)  Platelets 150 - 400 K/uL 276 241 216    ABG    Component Value Date/Time   PHART 7.503 (H) 10/02/2020 0307   PCO2ART 34.1 10/02/2020 0307   PO2ART 81 (L) 10/02/2020 0307   HCO3 26.9 10/02/2020 0307   TCO2 28 10/02/2020 0307   ACIDBASEDEF 3.0 (H) 10/01/2020 1520   O2SAT 97.0 10/02/2020 0307    CBG (last 3)  Recent Labs    10/02/20 2312 10/03/20 0317 10/03/20 0738  GLUCAP 295* 132* 101*    Critical care time: 34 minutes  Chesley Mires, MD [place] Pager - [phone] 10/03/2020, 10:06 AM

## 2020-10-03 NOTE — Progress Notes (Signed)
OT Cancellation Note  Patient Details Name: Michelle [name] MRN: [id number] DOB: Dec 10, 1930   Cancelled Treatment:    Reason Eval/Treat Not Completed: Medical issues which prohibited therapy.  Family deciding on goals of care surrounding ventilation.  OT to continue efforts as appropriate  Metta Clines 10/03/2020, 2:42 PM 10/03/2020  RP, OTR/L  Acute Rehabilitation Services  Office:  [phone]

## 2020-10-03 NOTE — Progress Notes (Signed)
I spoke with pt's daughter and grandson at bedside.  Explained current status and treatment plan.  Expressed concern that she has increasing oxygen needs, and is very debilitated.  Explained that if her status continues to get worse, then she would need intubation and mechanical ventilation but there is significant concern about her ability to be liberated from the ventilator.  They would like to discuss this with Ms. Scull's son.  He is supposed to arrived to hospital later today.  Time spent 15 minutes.  Chesley Mires, MD [place] Pager - [phone] 10/03/2020, 12:31 PM

## 2020-10-03 NOTE — Progress Notes (Signed)
[place] Progress Note Patient Name: Michelle [name] DOB: March 17, 1930 MRN: [id number]   Date of Service  10/03/2020  HPI/Events of Note  Nursing concerned about episodes of  bigeminy.  eICU Interventions  Plan: BMP and Mg++ level STAT.     Intervention Category Major Interventions: Arrhythmia - evaluation and management  Kayle Correa Cornelia Copa 10/03/2020, 10:34 PM

## 2020-10-03 NOTE — Progress Notes (Signed)
Spoke with pt's two sons (one by phone).  Updated about current status and concern for continued respiratory status deterioration.  They are uncertain about limiting care at this time.  Will continue goals of care discussion.  Chesley Mires, MD [place] Pager - [phone] 10/03/2020, 4:06 PM

## 2020-10-04 ENCOUNTER — Inpatient Hospital Stay (HOSPITAL_COMMUNITY): Payer: Medicare HMO

## 2020-10-04 DIAGNOSIS — J189 Pneumonia, unspecified organism: Secondary | ICD-10-CM

## 2020-10-04 DIAGNOSIS — J9601 Acute respiratory failure with hypoxia: Secondary | ICD-10-CM | POA: Diagnosis not present

## 2020-10-04 DIAGNOSIS — I2699 Other pulmonary embolism without acute cor pulmonale: Secondary | ICD-10-CM | POA: Diagnosis not present

## 2020-10-04 LAB — BASIC METABOLIC PANEL
Anion gap: 8 (ref 5–15)
Anion gap: 8 (ref 5–15)
BUN: 24 mg/dL — ABNORMAL HIGH (ref 8–23)
BUN: 24 mg/dL — ABNORMAL HIGH (ref 8–23)
CO2: 22 mmol/L (ref 22–32)
CO2: 22 mmol/L (ref 22–32)
Calcium: 8.7 mg/dL — ABNORMAL LOW (ref 8.9–10.3)
Calcium: 8.8 mg/dL — ABNORMAL LOW (ref 8.9–10.3)
Chloride: 109 mmol/L (ref 98–111)
Chloride: 109 mmol/L (ref 98–111)
Creatinine, Ser: 0.93 mg/dL (ref 0.44–1.00)
Creatinine, Ser: 0.89 mg/dL (ref 0.44–1.00)
GFR, Estimated: 59 mL/min — ABNORMAL LOW (ref >60)
GFR, Estimated: >60 mL/min (ref >60)
Glucose, Bld: 163 mg/dL — ABNORMAL HIGH (ref 70–99)
Glucose, Bld: 153 mg/dL — ABNORMAL HIGH (ref 70–99)
Potassium: 4.2 mmol/L (ref 3.5–5.1)
Potassium: 4.3 mmol/L (ref 3.5–5.1)
Sodium: 139 mmol/L (ref 135–145)
Sodium: 139 mmol/L (ref 135–145)

## 2020-10-04 LAB — CULTURE, BLOOD (ROUTINE X 2)
Culture: NO GROWTH
Culture: NO GROWTH
Special Requests: ADEQUATE
Special Requests: ADEQUATE

## 2020-10-04 LAB — CBC
HCT: 23 % — ABNORMAL LOW (ref 36.0–46.0)
Hemoglobin: 7.4 g/dL — ABNORMAL LOW (ref 12.0–15.0)
MCH: 30.8 pg (ref 26.0–34.0)
MCHC: 32.2 g/dL (ref 30.0–36.0)
MCV: 95.8 fL (ref 80.0–100.0)
Platelets: 288 10*3/uL (ref 150–400)
RBC: 2.4 MIL/uL — ABNORMAL LOW (ref 3.87–5.11)
RDW: 17.9 % — ABNORMAL HIGH (ref 11.5–15.5)
WBC: 20.6 10*3/uL — ABNORMAL HIGH (ref 4.0–10.5)
nRBC: 0.4 % — ABNORMAL HIGH (ref 0.0–0.2)

## 2020-10-04 LAB — MAGNESIUM: Magnesium: 2 mg/dL (ref 1.7–2.4)

## 2020-10-04 LAB — GLUCOSE, CAPILLARY
Glucose-Capillary: 133 mg/dL — ABNORMAL HIGH (ref 70–99)
Glucose-Capillary: 123 mg/dL — ABNORMAL HIGH (ref 70–99)
Glucose-Capillary: 120 mg/dL — ABNORMAL HIGH (ref 70–99)
Glucose-Capillary: 221 mg/dL — ABNORMAL HIGH (ref 70–99)

## 2020-10-04 MED ORDER — FUROSEMIDE 10 MG/ML IJ SOLN
40.0000 mg | Freq: Once | INTRAMUSCULAR | Status: AC
  Administered 2020-10-04: 40 mg via INTRAVENOUS
  Filled 2020-10-04: qty 4

## 2020-10-04 MED ORDER — PREDNISONE 20 MG PO TABS: 20.0000 mg | ORAL_TABLET | Freq: Every day | ORAL | Status: DC

## 2020-10-04 MED ORDER — KETAMINE HCL 50 MG/5ML IJ SOSY
PREFILLED_SYRINGE | INTRAMUSCULAR | Status: AC
  Filled 2020-10-04: qty 5

## 2020-10-04 MED ORDER — ROCURONIUM BROMIDE 10 MG/ML (PF) SYRINGE
PREFILLED_SYRINGE | INTRAVENOUS | Status: AC
  Filled 2020-10-04: qty 10

## 2020-10-04 MED ORDER — FENTANYL CITRATE (PF) 100 MCG/2ML IJ SOLN
INTRAMUSCULAR | Status: AC
  Filled 2020-10-04: qty 2

## 2020-10-04 MED ORDER — ETOMIDATE 2 MG/ML IV SOLN
INTRAVENOUS | Status: AC
  Filled 2020-10-04: qty 20

## 2020-10-04 MED ORDER — HEPARIN (PORCINE) 25000 UT/250ML-% IV SOLN
550.0000 [IU]/h | INTRAVENOUS | Status: DC
  Administered 2020-10-04: 700 [IU]/h via INTRAVENOUS
  Administered 2020-10-06: 600 [IU]/h via INTRAVENOUS
  Administered 2020-10-08 – 2020-10-10 (×2): 650 [IU]/h via INTRAVENOUS
  Filled 2020-10-04 (×4): qty 250

## 2020-10-04 MED ORDER — MIDAZOLAM HCL 2 MG/2ML IJ SOLN
INTRAMUSCULAR | Status: AC
  Filled 2020-10-04: qty 2

## 2020-10-04 NOTE — Progress Notes (Signed)
[place] for apixaban>>heparin Indication: pulmonary embolus  No Known Allergies  Patient Measurements: ~50 kg  Vital Signs: Temp: 98.3 F (36.8 C) (08/21 1500) Temp Source: Axillary (08/21 1500) BP: 124/87 (08/21 2100) Pulse Rate: 126 (08/21 2100)  Labs: Recent Labs    10/02/20 0546 10/02/20 1420 10/03/20 0025 10/04/20 0008 10/04/20 0344  HGB 7.4* 7.4* 8.2*  --  7.4*  HCT 22.7* 23.0* 25.2*  --  23.0*  PLT 216 241 276  --  288  APTT 96*  --   --   --   --   CREATININE 0.85  --  0.91 0.93 0.89     Estimated Creatinine Clearance: 32.5 mL/min (by C-G formula based on SCr of 0.89 mg/dL).   Medical History: Past Medical History:  Diagnosis Date   Arthritis    Cancer ([place])    Bilaterial Breast Cancer   Dyspnea    GERD (gastroesophageal reflux disease)    Hypertension    Stroke [facility])    "mini stroke" per patient     Assessment: 87 y/o F with history of breast cancer, found to have small PE 10/14/2020. No anticoagulation prior to admission. Pharmacy consulted for heparin then changed to eliquis.  Pt now NPO so will change back to heparin    Goal of Therapy:  Heparin level 0.3-0.7 units/ml, aPTT 66-102 sec Monitor platelets by anticoagulation protocol: Yes   Plan:   - Heparin drip at 700 units/hr with no bolus - Check aPTT in 8 hours -Daily aPTT, HL and CBC - Will monitor for bleeding  Thank you for allowing pharmacy to be a part of this patient's care.  Excell Seltzer, PharmD

## 2020-10-04 NOTE — Plan of Care (Signed)
Advance Care Planning Note   Participants: Myself, Patient's three adult children   HCPOA: None   Update on clinical status:   Called to bedside around 8PM for respiratory distress. Recently transitioned off HFNC to NRB. Pt  frail appearing and tachypneic with Rr40s, SpO2 low 80s. Stat CXR with worsening bilateral alveolar opacities, some interstitial thickening - ddx developing ARDS related to her presumed HCAP, iatrogenic edema. '40mg'$  IV lasix given and placed on NIPPV. SpO2 improved to high 90s. Remains tachypneic in mid 30s but more comfortable appearing.  Chart reviewed. [place] discussions have been ongoing. Provided clinical update to children and discussed my concern about her worsening respiratory status.  Michelle [name] has previously told her children that she would not want advanced life support measures, including endotracheal intubation with mechanical ventilation. They also feel that CPR/ACLS in the event of cardiac arrest would not be acceptable interventions given prior conversations with their mother.   Decision Making: Discussed at length clinical options. All adult children agreed that scope of care should be narrowed.   Result of conversation: Change code status to DNR - no intubation/mv, CPR, or ACLS. Continue supportive care with NIPPV and diuresis for net negative volume goal until AM In the event of further decompensation overnight, transition to comfort-directed care Family aware that if unable to liberate from [place] in coming day(s) that we will need to reconsider transition to comfort directed care.   I spent 47 minutes in direct conversation with the patient's three adult children regarding goals of care  [place]

## 2020-10-04 NOTE — Progress Notes (Signed)
PT Cancellation Note  Patient Details Name: Michelle [name] MRN: [id number] DOB: 1930-07-02   Cancelled Treatment:    Reason Eval/Treat Not Completed: Medical issues which prohibited therapy (Pt has no reserve per nurse and nurse agrees with sign off. MD - please reorder if pt status changes.)   Alvira Philips 10/04/2020, 2:38 PM Astin Rape M,PT Acute Rehab Services [phone] (pager)

## 2020-10-04 NOTE — Progress Notes (Signed)
NAME:  Michelle [name], MRN:  [id number], DOB:  [birth year], LOS: 5 ADMISSION DATE:  10/05/2020, CONSULTATION DATE:  10/01/2020 REFERRING MD:  Dr. Sloan Leiter, CHIEF COMPLAINT:  Respiratory Distress    History of Present Illness:  87 yr old female presented to ER with chest pain and dyspnea.  Found to have small PE and started on heparin gtt.  Urine culture positive for Pseudomonas.  Developed worsening hypoxia and tachycardia from HCAP and transferred to ICU.  She had admission in July 2022 for sepsis from Aerococcus bacteremia, and acute ITP.  Pertinent  Medical History  HTN, Breast CA on ibrance, GERD, CVA  Significant Hospital Events: Including procedures, antibiotic start and stop dates in addition to other pertinent events   8/16 admitted to ED 8/18 transferred to ICU   Interim History / Subjective:  Remains on high flow oxygen.  Objective   Blood pressure (!) 109/45, pulse (!) 112, temperature 97.9 F (36.6 C), temperature source Axillary, resp. rate (!) 21, height '5\' 1"'$  (1.549 m), weight 47.1 kg, SpO2 100 %.    FiO2 (%):  [100 %] 100 %   Intake/Output Summary (Last 24 hours) at 10/04/2020 0831 Last data filed at 10/04/2020 0600 Gross per 24 hour  Intake 550 ml  Output 210 ml  Net 340 ml   Filed Weights   10/09/2020 0700  Weight: 47.1 kg    Examination:  General - alert Eyes - pupils reactive ENT - no sinus tenderness, no stridor Cardiac - regular, tachycardic Chest - bronchial breath sounds on Rt > Lt Abdomen - soft, non tender, + bowel sounds Extremities - no cyanosis, clubbing, or edema Skin - no rashes Neuro - moves extremities, follows commands Psych - normal mood and behavior    Resolved Hospital Problem list   Elevated troponin from demand ischemia 2nd to PE  Assessment & Plan:   Acute hypoxic respiratory failure form HCAP and acute pulmonary embolism. - f/u CXR - goal SpO2 > 90 to 95%; using high flow oxygen - prn Bipap - monitor need for intubation -  continue eliquis  HCAP, Pseudomonal UTI. - day 4 of cefepime - f/u blood cultures from 8/16  Hx of breast cancer. - Follows with Dr. [name] outpt - Continue Arimidex  - Hold Ibrance in setting of infection   Anemia of critical illness and chronic disease. - f/u CBC - transfuse for Hb < 7  ITP. - change prednisone to 20 mg daily  Steroid induced hyperglycemia. - SSI  Goals of care. - had extensive meeting pt's children on 8/20 and had detailed discussion with Michelle [name] on 8/21 - they are still uncertain regarding goals of care; as of now she would agree to aggressive measures if needed  Best Practice (right click and "Reselect all SmartList Selections" daily)  Diet/type: heart healthy DVT prophylaxis: eliquis GI prophylaxis: not indicated Code Status:  full code Last date of multidisciplinary goals of care discussion:    Labs    CMP Latest Ref Rng & Units 10/04/2020 10/04/2020 10/03/2020  Glucose 70 - 99 mg/dL 153(H) 163(H) 259(H)  BUN 8 - 23 mg/dL 24(H) 24(H) 26(H)  Creatinine 0.44 - 1.00 mg/dL 0.89 0.93 0.91  Sodium 135 - 145 mmol/L 139 139 131(L)  Potassium 3.5 - 5.1 mmol/L 4.3 4.2 4.4  Chloride 98 - 111 mmol/L 109 109 101  CO2 22 - 32 mmol/L 22 22 21(L)  Calcium 8.9 - 10.3 mg/dL 8.8(L) 8.7(L) 8.4(L)  Total Protein 6.5 - 8.1 g/dL - - -  Total Bilirubin 0.3 - 1.2 mg/dL - - -  Alkaline Phos 38 - 126 U/L - - -  AST 15 - 41 U/L - - -  ALT 0 - 44 U/L - - -    CBC Latest Ref Rng & Units 10/04/2020 10/03/2020 10/02/2020  WBC 4.0 - 10.5 K/uL 20.6(H) 18.2(H) 16.1(H)  Hemoglobin 12.0 - 15.0 g/dL 7.4(L) 8.2(L) 7.4(L)  Hematocrit 36.0 - 46.0 % 23.0(L) 25.2(L) 23.0(L)  Platelets 150 - 400 K/uL 288 276 241    ABG    Component Value Date/Time   PHART 7.503 (H) 10/02/2020 0307   PCO2ART 34.1 10/02/2020 0307   PO2ART 81 (L) 10/02/2020 0307   HCO3 26.9 10/02/2020 0307   TCO2 28 10/02/2020 0307   ACIDBASEDEF 3.0 (H) 10/01/2020 1520   O2SAT 97.0 10/02/2020 0307    CBG  (last 3)  Recent Labs    10/03/20 2057 10/03/20 2314 10/04/20 0724  GLUCAP 139* 136* 133*    Critical care time: 33 minutes  Chesley Mires, MD [place] Pager - [phone] 10/04/2020, 8:31 AM

## 2020-10-04 NOTE — Progress Notes (Addendum)
[place] Progress Note Patient Name: DAVANNA CLUKEY DOB: 07-May-1930 MRN: [id number]   Date of Service  10/04/2020  HPI/Events of Note  [place] red alert:  Camera: On NRB, RR > 25. Sinuis tachy 140, sats 88%, MAP 69.  Discussed with RN.  PE, AHHRF. Pneumonia, Pseudomonal UTI . Recent ITP in July on prednisone.  On eliquis. Anemia- Hg 7.4 wbc 20 K. 7.50/34/81 from AM.   eICU Interventions  Notified CCM team for evaluation for intubation, for impending resp failure. Bed side RN calling family  also for code status, for now full code.     Intervention Category Major Interventions: Respiratory failure - evaluation and management  [place] Sow 10/04/2020, 7:36 PM  19:57 Daughter at bed side. CCM doc at bed side. Now on BiPAP. Code status being discussed.  VS improving HR 120, sats 92% on BiPAP. MAP 69.  21:12 CxR film seen and compared: worsening bilateral, perihilar predominant air space density and effusion. Worsening. S/p Lasix 40 mg and on BiPAP. Full code. Low thresh hold for intubation.

## 2020-10-04 NOTE — Progress Notes (Signed)
[place] Progress Note Patient Name: Michelle [name] DOB: Aug 12, 1930 MRN: [id number]   Date of Service  10/04/2020  HPI/Events of Note   NPO, on BiPAP. DNR status now.   eICU Interventions  shift to heparin w/o bolus for PE, instead of eliquis.      Intervention Category Intermediate Interventions: Other:  [place] Sow 10/04/2020, 10:47 PM

## 2020-10-05 ENCOUNTER — Inpatient Hospital Stay (HOSPITAL_COMMUNITY): Payer: Medicare HMO

## 2020-10-05 DIAGNOSIS — J9601 Acute respiratory failure with hypoxia: Secondary | ICD-10-CM | POA: Diagnosis not present

## 2020-10-05 DIAGNOSIS — J189 Pneumonia, unspecified organism: Secondary | ICD-10-CM | POA: Diagnosis not present

## 2020-10-05 LAB — POCT I-STAT 7, (LYTES, BLD GAS, ICA,H+H)
Acid-Base Excess: 2 mmol/L (ref 0.0–2.0)
Bicarbonate: 23.3 mmol/L (ref 20.0–28.0)
Calcium, Ion: 0.95 mmol/L — ABNORMAL LOW (ref 1.15–1.40)
HCT: 16 % — ABNORMAL LOW (ref 36.0–46.0)
Hemoglobin: 5.4 g/dL — CL (ref 12.0–15.0)
O2 Saturation: 79 %
Patient temperature: 97.4
Potassium: 3.6 mmol/L (ref 3.5–5.1)
Sodium: 136 mmol/L (ref 135–145)
TCO2: 24 mmol/L (ref 22–32)
pCO2 arterial: 22.2 mmHg — ABNORMAL LOW (ref 32.0–48.0)
pH, Arterial: 7.628 (ref 7.350–7.450)
pO2, Arterial: 32 mmHg — CL (ref 83.0–108.0)

## 2020-10-05 LAB — PROTIME-INR
INR: 3 — ABNORMAL HIGH (ref 0.8–1.2)
Prothrombin Time: 31.2 seconds — ABNORMAL HIGH (ref 11.4–15.2)

## 2020-10-05 LAB — BASIC METABOLIC PANEL
Anion gap: 12 (ref 5–15)
BUN: 30 mg/dL — ABNORMAL HIGH (ref 8–23)
CO2: 20 mmol/L — ABNORMAL LOW (ref 22–32)
Calcium: 8.8 mg/dL — ABNORMAL LOW (ref 8.9–10.3)
Chloride: 108 mmol/L (ref 98–111)
Creatinine, Ser: 1 mg/dL (ref 0.44–1.00)
GFR, Estimated: 54 mL/min — ABNORMAL LOW (ref >60)
Glucose, Bld: 157 mg/dL — ABNORMAL HIGH (ref 70–99)
Potassium: 3.8 mmol/L (ref 3.5–5.1)
Sodium: 140 mmol/L (ref 135–145)

## 2020-10-05 LAB — CBC
HCT: 23.6 % — ABNORMAL LOW (ref 36.0–46.0)
Hemoglobin: 7.1 g/dL — ABNORMAL LOW (ref 12.0–15.0)
MCH: 30.3 pg (ref 26.0–34.0)
MCHC: 30.1 g/dL (ref 30.0–36.0)
MCV: 100.9 fL — ABNORMAL HIGH (ref 80.0–100.0)
Platelets: 308 10*3/uL (ref 150–400)
RBC: 2.34 MIL/uL — ABNORMAL LOW (ref 3.87–5.11)
RDW: 18.7 % — ABNORMAL HIGH (ref 11.5–15.5)
WBC: 24.3 10*3/uL — ABNORMAL HIGH (ref 4.0–10.5)
nRBC: 1.4 % — ABNORMAL HIGH (ref 0.0–0.2)

## 2020-10-05 LAB — APTT
aPTT: 63 seconds — ABNORMAL HIGH (ref 24–36)
aPTT: 111 seconds — ABNORMAL HIGH (ref 24–36)

## 2020-10-05 LAB — GLUCOSE, CAPILLARY
Glucose-Capillary: 196 mg/dL — ABNORMAL HIGH (ref 70–99)
Glucose-Capillary: 127 mg/dL — ABNORMAL HIGH (ref 70–99)
Glucose-Capillary: 117 mg/dL — ABNORMAL HIGH (ref 70–99)
Glucose-Capillary: 148 mg/dL — ABNORMAL HIGH (ref 70–99)

## 2020-10-05 MED ORDER — SODIUM CHLORIDE 0.9 % IV SOLN: Freq: Once | INTRAVENOUS | Status: AC

## 2020-10-05 MED ORDER — ONDANSETRON HCL 4 MG/2ML IJ SOLN: 4.0000 mg | Freq: Four times a day (QID) | INTRAMUSCULAR | Status: DC | PRN

## 2020-10-05 MED ORDER — HYDROCODONE-ACETAMINOPHEN 5-325 MG PO TABS: 1.0000 | ORAL_TABLET | Freq: Four times a day (QID) | ORAL | Status: DC | PRN

## 2020-10-05 MED ORDER — SODIUM CHLORIDE 0.9 % IV SOLN
250.0000 mL | INTRAVENOUS | Status: DC
  Administered 2020-10-06: 250 mL via INTRAVENOUS

## 2020-10-05 MED ORDER — ONDANSETRON HCL 4 MG PO TABS: 4.0000 mg | ORAL_TABLET | Freq: Four times a day (QID) | ORAL | Status: DC | PRN

## 2020-10-05 MED ORDER — LACTATED RINGERS IV SOLN: INTRAVENOUS | Status: DC

## 2020-10-05 MED ORDER — PREDNISONE 20 MG PO TABS
40.0000 mg | ORAL_TABLET | Freq: Every day | ORAL | Status: DC
  Administered 2020-10-10: 40 mg via ORAL
  Filled 2020-10-05 (×2): qty 2

## 2020-10-05 MED ORDER — METHYLPREDNISOLONE SODIUM SUCC 40 MG IJ SOLR
30.0000 mg | Freq: Every day | INTRAMUSCULAR | Status: DC
  Administered 2020-10-05 – 2020-10-09 (×5): 30 mg via INTRAVENOUS
  Filled 2020-10-05 (×6): qty 0.75

## 2020-10-05 MED ORDER — PREDNISONE 20 MG PO TABS: 40.0000 mg | ORAL_TABLET | Freq: Every day | ORAL | Status: DC

## 2020-10-05 MED ORDER — SODIUM CHLORIDE 0.9 % IV SOLN
2.0000 g | INTRAVENOUS | Status: AC
  Administered 2020-10-05: 2 g via INTRAVENOUS
  Filled 2020-10-05: qty 2

## 2020-10-05 MED ORDER — MIRTAZAPINE 15 MG PO TABS
15.0000 mg | ORAL_TABLET | Freq: Every day | ORAL | Status: DC
  Administered 2020-10-09: 15 mg
  Filled 2020-10-05 (×2): qty 1

## 2020-10-05 MED ORDER — ANASTROZOLE 1 MG PO TABS
1.0000 mg | ORAL_TABLET | Freq: Every day | ORAL | Status: DC
  Filled 2020-10-05: qty 1

## 2020-10-05 MED ORDER — SENNOSIDES-DOCUSATE SODIUM 8.6-50 MG PO TABS
1.0000 | ORAL_TABLET | Freq: Every day | ORAL | Status: DC
  Administered 2020-10-09: 1
  Filled 2020-10-05 (×2): qty 1

## 2020-10-05 MED ORDER — FUROSEMIDE 10 MG/ML IJ SOLN
40.0000 mg | Freq: Once | INTRAMUSCULAR | Status: DC
  Filled 2020-10-05: qty 4

## 2020-10-05 MED ORDER — DEXMEDETOMIDINE HCL IN NACL 400 MCG/100ML IV SOLN
0.2000 ug/kg/h | INTRAVENOUS | Status: DC
  Administered 2020-10-05: 0.2 ug/kg/h via INTRAVENOUS
  Administered 2020-10-06: 0.3 ug/kg/h via INTRAVENOUS
  Administered 2020-10-07: 0.4 ug/kg/h via INTRAVENOUS
  Administered 2020-10-08 – 2020-10-09 (×3): 0.7 ug/kg/h via INTRAVENOUS
  Filled 2020-10-05 (×7): qty 100

## 2020-10-05 MED ORDER — PHENYLEPHRINE HCL-NACL 20-0.9 MG/250ML-% IV SOLN
25.0000 ug/min | INTRAVENOUS | Status: DC
  Administered 2020-10-05: 25 ug/min via INTRAVENOUS
  Administered 2020-10-06: 70 ug/min via INTRAVENOUS
  Administered 2020-10-06: 35 ug/min via INTRAVENOUS
  Administered 2020-10-06: 65 ug/min via INTRAVENOUS
  Administered 2020-10-07: 85 ug/min via INTRAVENOUS
  Administered 2020-10-07: 95 ug/min via INTRAVENOUS
  Administered 2020-10-07: 100 ug/min via INTRAVENOUS
  Filled 2020-10-05 (×7): qty 250

## 2020-10-05 NOTE — Progress Notes (Signed)
NAME:  Michelle [name], MRN:  [id number], DOB:  [birth year], LOS: 6 ADMISSION DATE:  10/04/2020, CONSULTATION DATE:  10/01/2020 REFERRING MD:  Dr. Sloan Leiter, CHIEF COMPLAINT:  Respiratory Distress    History of Present Illness:  87 yr old female presented to ER with chest pain and dyspnea.  Found to have small PE and started on heparin gtt.  Urine culture positive for Pseudomonas.  Developed worsening hypoxia and tachycardia from HCAP and transferred to ICU.  She had admission in July 2022 for sepsis from Aerococcus bacteremia, and acute ITP.  Pertinent  Medical History  HTN, Breast CA on ibrance, GERD, CVA  Significant Hospital Events: Including procedures, antibiotic start and stop dates in addition to other pertinent events   8/16 admitted to ED 8/18 transferred to ICU  8/21 Made DNR after family discussion  Interim History / Subjective:  O/N Events: Family discussion O/N and made DNR.   On HFNC, wakes up intermittently during interview.  Objective   Blood pressure 105/73, pulse 97, temperature 98.1 F (36.7 C), temperature source Axillary, resp. rate (!) 30, height '5\' 1"'$  (1.549 m), weight 47.1 kg, SpO2 100 %.    Vent Mode: BIPAP FiO2 (%):  [60 %-100 %] 100 % Set Rate:  [12 bmp] 12 bmp PEEP:  [5 cmH20-8 cmH20] 5 cmH20   Intake/Output Summary (Last 24 hours) at 10/05/2020 0924 Last data filed at 10/05/2020 0916 Gross per 24 hour  Intake 447.77 ml  Output 1100 ml  Net -652.23 ml   Filed Weights   09/24/2020 0700  Weight: 47.1 kg    Examination: Physical Exam Constitutional:      Appearance: She is ill-appearing.     Comments: Ill appearing, non interactive  Cardiovascular:     Rate and Rhythm: Normal rate and regular rhythm.     Pulses: Normal pulses.     Heart sounds: Normal heart sounds. No murmur heard.   No friction rub. No gallop.  Pulmonary:     Breath sounds: Rhonchi present. No wheezing.     Comments: Saturating at 99% on HFNC, rhonchi appreciated bilaterally, no  accessory muscle use. Chest:     Chest wall: No tenderness.  Abdominal:     General: Bowel sounds are normal.     Tenderness: There is no abdominal tenderness. There is no guarding.  Musculoskeletal:     Right lower leg: No edema.     Left lower leg: No edema.     Resolved Hospital Problem list   Elevated troponin from demand ischemia 2nd to PE  Assessment & Plan:   Acute hypoxic respiratory failure form HCAP and acute pulmonary embolism. - Goal SpO2 > 90 to 95%; using high flow oxygen - BiPAP PRN - Continue heparin   HCAP, Pseudomonal UTI. - Cefepime day 5/7 - BC shows NGTD Day 5  Hx of breast cancer. - Follows with Dr. [name] outpt - Continue Arimidex  - Hold Ibrance in setting of infection  - Oncology following  Anemia of critical illness and chronic disease. - f/u CBC - transfuse for Hb < 7  ITP. - Increase Prednisone back to 40 mg daily  Steroid induced hyperglycemia. - SSI  Goals of care. - Made DNR 10/04/20 - Family discussion on 10/05/20, per [place] discussion, we will continue with aggressive care at this time, but Ms. Lassiter continues to be DNR/DNI.   Best Practice (right click and "Reselect all SmartList Selections" daily)  Diet/type: heart healthy DVT prophylaxis: Heparin GI prophylaxis: not indicated Code  Status:  full code Last date of multidisciplinary goals of care discussion:    Labs    CMP Latest Ref Rng & Units 10/05/2020 10/04/2020 10/04/2020  Glucose 70 - 99 mg/dL 157(H) 153(H) 163(H)  BUN 8 - 23 mg/dL 30(H) 24(H) 24(H)  Creatinine 0.44 - 1.00 mg/dL 1.00 0.89 0.93  Sodium 135 - 145 mmol/L 140 139 139  Potassium 3.5 - 5.1 mmol/L 3.8 4.3 4.2  Chloride 98 - 111 mmol/L 108 109 109  CO2 22 - 32 mmol/L 20(L) 22 22  Calcium 8.9 - 10.3 mg/dL 8.8(L) 8.8(L) 8.7(L)  Total Protein 6.5 - 8.1 g/dL - - -  Total Bilirubin 0.3 - 1.2 mg/dL - - -  Alkaline Phos 38 - 126 U/L - - -  AST 15 - 41 U/L - - -  ALT 0 - 44 U/L - - -    CBC Latest Ref Rng &  Units 10/05/2020 10/04/2020 10/03/2020  WBC 4.0 - 10.5 K/uL 24.3(H) 20.6(H) 18.2(H)  Hemoglobin 12.0 - 15.0 g/dL 7.1(L) 7.4(L) 8.2(L)  Hematocrit 36.0 - 46.0 % 23.6(L) 23.0(L) 25.2(L)  Platelets 150 - 400 K/uL 308 288 276    ABG    Component Value Date/Time   PHART 7.503 (H) 10/02/2020 0307   PCO2ART 34.1 10/02/2020 0307   PO2ART 81 (L) 10/02/2020 0307   HCO3 26.9 10/02/2020 0307   TCO2 28 10/02/2020 0307   ACIDBASEDEF 3.0 (H) 10/01/2020 1520   O2SAT 97.0 10/02/2020 0307    CBG (last 3)  Recent Labs    10/04/20 1508 10/04/20 1930 10/05/20 0726  [place]    Maudie Mercury, MD IMTS, PGY-3 Pager: [phone] 10/05/2020,9:24 AM

## 2020-10-05 NOTE — Progress Notes (Signed)
OT Cancellation Note  Patient Details Name: ADELISE MCDUFFEE MRN: [id number] DOB: [birth year]   Cancelled Treatment:    Reason Eval/Treat Not Completed: Medical issues which prohibited therapy. Pt on 40L HHNC and 15L NRB with SpO2 88%, HR 120s, and RR 38. RN plans for placement on BiPAP. Will hold and return as medically ready.   [place], OTR/L Acute Rehab Pager: [phone] Office: [phone] 10/05/2020, 5:22 PM

## 2020-10-05 NOTE — Progress Notes (Signed)
[place] for apixaban>>heparin Indication: pulmonary embolus  No Known Allergies  Patient Measurements: ~50 kg  Vital Signs: Temp: 98.7 F (37.1 C) (08/22 1131) Temp Source: Axillary (08/22 1131) BP: 105/73 (08/22 0757) Pulse Rate: 97 (08/22 0757)  Labs: Recent Labs    10/03/20 0025 10/04/20 0008 10/04/20 0344 10/05/20 0128 10/05/20 1057  HGB 8.2*  --  7.4* 7.1*  --   HCT 25.2*  --  23.0* 23.6*  --   PLT 276  --  288 308  --   APTT  --   --   --  63* 111*  LABPROT  --   --   --  31.2*  --   INR  --   --   --  3.0*  --   CREATININE 0.91 0.93 0.89 1.00  --      Estimated Creatinine Clearance: 28.9 mL/min (by C-G formula based on SCr of 1 mg/dL).   Medical History: Past Medical History:  Diagnosis Date   Arthritis    Cancer ([place])    Bilaterial Breast Cancer   Dyspnea    GERD (gastroesophageal reflux disease)    Hypertension    Stroke [facility])    "mini stroke" per patient     Assessment: 87 y/o F with history of breast cancer, found to have small PE 10/02/2020. No anticoagulation prior to admission. Pharmacy consulted for heparin then changed to eliquis from 8/17-8/21 AM.  Pt now NPO, pharmacy has been consulted for heparin dosing.   aPTT supratherapeutic 8/22 @ 111 with heparin running at 700 units/h. Hgb low at 7.1, down from 7.4  on 8/21. PLT WNL. Per RN, no signs of bleeding or issues with infusion. Lab was drawn via peripheral stick.     Goal of Therapy:  Heparin level 0.3-0.7 units/ml, aPTT 66-102 sec Monitor platelets by anticoagulation protocol: Yes   Plan:   - Decrease heparin to 650 units/h  - Check aPTT in 8 hours - Daily aPTT, HL and CBC - Monitor for signs and symptoms of bleeding   Thank you for allowing pharmacy to be a part of this patient's care.  Adria Dill, PharmD PGY-1 Acute Care Resident  10/05/2020 12:03 PM

## 2020-10-05 NOTE — Progress Notes (Signed)
Added Neo-Synephrine for borderline low blood pressures

## 2020-10-05 NOTE — Progress Notes (Signed)
RT NOTE: patient taken off of bipap and placed on Heated high flow nasal cannula at 40L and 100% FIO2.  NRB also hooked up and cut on in case patient needs.  Will continue to monitor and use bipap as needed.

## 2020-10-05 NOTE — Progress Notes (Signed)
Called to assess patient  Ripping off BiPAP, saturations barely making it to 90 on mask  She is tachycardic and tachypneic  Will reattempt BiPAP  Family will be updated   Patient does not want invasive interventions  Will continue to monitor and assess closely

## 2020-10-05 NOTE — Progress Notes (Signed)
PHARMACY NOTE:  ANTIMICROBIAL RENAL DOSAGE ADJUSTMENT  Current antimicrobial regimen includes a mismatch between antimicrobial dosage and estimated renal function.  As per policy approved by the Pharmacy & Therapeutics and Medical Executive Committees, the antimicrobial dosage will be adjusted accordingly.  Current antimicrobial dosage:  Cefepime 2g IV Q12H   Indication: UTI/PNA   Renal Function:  Estimated Creatinine Clearance: 28.9 mL/min (by C-G formula based on SCr of 1 mg/dL).  Antimicrobial dosage has been changed to:  Cefepime 2g IV Q24H    Thank you for allowing pharmacy to be a part of this patient's care.  Adria Dill, PharmD PGY-1 Acute Care Resident  10/05/2020 8:03 AM

## 2020-10-05 NOTE — Progress Notes (Addendum)
Michelle [name]   DOB:November 20, 1930   VA#:[id number]   VDP#:[id number]  Subjective: Michelle [name] was transferred to the ICU on 8/18 due to worsening hypoxia and tachycardia from HCAP.  Chart has been reviewed including note from last evening regarding advance care planning.  She developed worsening respiratory distress last night with worsening chest x-Michelle findings and she was placed on BiPAP.  Family discussion was had and she was changed to DNR/DNI.  Today, her son and granddaughter are at the bedside.  She is on high flow oxygen at the time of visit.  Her family reports that she looks somewhat better today.  She is able to open her eyes and answer some questions for me.  However, she does appear to be quite tired.  She has been transition back to heparin from Eliquis due to n.p.o. status.  She continues to receive steroids.  No bleeding reported.  Her platelets remain normal.  Objective:  Vitals:   10/05/20 0839 10/05/20 1131  BP:    Pulse:    Resp:    Temp: 98.1 F (36.7 C) 98.7 F (37.1 C)  SpO2:      Body mass index is 19.62 kg/m.  Intake/Output Summary (Last 24 hours) at 10/05/2020 1327 Last data filed at 10/05/2020 0916 Gross per 24 hour  Intake 307.77 ml  Output 1100 ml  Net -792.23 ml     Sclerae unicteric  Oropharynx shows no thrush or other lesions  No cervical or supraclavicular adenopathy  Lungs rhonchi bilaterally  Heart regular rate and rhythm  Abdomen soft, +BS  Neuro nonfocal    CBG (last 3)  Recent Labs    10/04/20 1930 10/05/20 0726 10/05/20 1130  GLUCAP 221* 196* 127*     Labs:  Lab Results  Component Value Date   WBC 24.3 (H) 10/05/2020   HGB 7.1 (L) 10/05/2020   HCT 23.6 (L) 10/05/2020   MCV 100.9 (H) 10/05/2020   PLT 308 10/05/2020   NEUTROABS 15.7 (H) 10/03/2020    '@LASTCHEMISTRY' @  Urine Studies No results for input(s): UHGB, CRYS in the last 72 hours.  Invalid input(s): UACOL, UAPR, USPG, UPH, UTP, UGL, UKET, UBIL, UNIT, UROB, ULEU, UEPI, UWBC,  URBC, UBAC, CAST, [place], [place]  Basic Metabolic Panel: Recent Labs  Lab 10/01/20 0118 10/02/20 0249 10/02/20 0546 10/03/20 0025 10/04/20 0008 10/04/20 0344 10/05/20 0128  NA 133*   < > 135 131* 139 139 140  K 4.0   < > 4.0 4.4 4.2 4.3 3.8  CL 103  --  106 101 109 109 108  CO2 24  --  23 21* 22 22 20*  GLUCOSE 148*  --  128* 259* 163* 153* 157*  BUN 15  --  20 26* 24* 24* 30*  CREATININE 0.69  --  0.85 0.91 0.93 0.89 1.00  CALCIUM 8.1*  --  8.1* 8.4* 8.7* 8.8* 8.8*  MG 1.7  --  1.9  --  2.0  --   --   PHOS 2.2*  --  2.5  --   --   --   --    < > = values in this interval not displayed.   GFR Estimated Creatinine Clearance: 28.9 mL/min (by C-G formula based on SCr of 1 mg/dL). Liver Function Tests: Recent Labs  Lab 10/03/2020 0120  AST 46*  ALT 61*  ALKPHOS 78  BILITOT 0.8  PROT 6.3*  ALBUMIN 2.5*   No results for input(s): LIPASE, AMYLASE in the last 168 hours. No results for  input(s): AMMONIA in the last 168 hours. Coagulation profile Recent Labs  Lab 10/05/20 0128  INR 3.0*    CBC: Recent Labs  Lab 10/07/2020 0120 09/30/20 0028 10/01/20 0118 10/01/20 1540 10/02/20 0249 10/02/20 0546 10/02/20 1420 10/03/20 0025 10/04/20 0344 10/05/20 0128  WBC 20.3*   < > 16.8* 19.5*  --  15.2* 16.1* 18.2* 20.6* 24.3*  NEUTROABS 18.0*  --  15.0* 17.5*  --  12.1*  --  15.7*  --   --   HGB 10.1*   < > 8.9* 9.1*   < > 7.4* 7.4* 8.2* 7.4* 7.1*  HCT 31.3*   < > 27.2* 28.0*   < > 22.7* 23.0* 25.2* 23.0* 23.6*  MCV 94.0   < > 93.2 93.6  --  93.8 93.1 95.1 95.8 100.9*  PLT 217   < > 221 260  --  216 241 276 288 308   < > = values in this interval not displayed.   Cardiac Enzymes: No results for input(s): CKTOTAL, CKMB, CKMBINDEX, TROPONINI in the last 168 hours. BNP: Invalid input(s): POCBNP CBG: Recent Labs  Lab 10/04/20 1112 10/04/20 1508 10/04/20 1930 10/05/20 0726 10/05/20 1130  GLUCAP 123* 120* 221* 196* 127*   D-Dimer No results for input(s): DDIMER in the  last 72 hours. Hgb A1c Recent Labs    10/03/20 0025  HGBA1C 6.9*   Lipid Profile No results for input(s): CHOL, HDL, LDLCALC, TRIG, CHOLHDL, LDLDIRECT in the last 72 hours. Thyroid function studies No results for input(s): TSH, T4TOTAL, T3FREE, THYROIDAB in the last 72 hours.  Invalid input(s): FREET3 Anemia work up No results for input(s): VITAMINB12, FOLATE, FERRITIN, TIBC, IRON, RETICCTPCT in the last 72 hours. Microbiology Recent Results (from the past 240 hour(s))  Blood culture (routine x 2)     Status: None   Collection Time: 10/08/2020  3:24 AM   Specimen: BLOOD  Result Value Ref Range Status   Specimen Description BLOOD RIGHT ANTECUBITAL  Final   Special Requests   Final    BOTTLES DRAWN AEROBIC AND ANAEROBIC Blood Culture adequate volume   Culture   Final    NO GROWTH 5 DAYS Performed at [facility], [address], [place], [place] [address]    Report Status 10/04/2020 FINAL  Final  Resp Panel by RT-PCR (Flu A&B, Covid) Nasopharyngeal Swab     Status: None   Collection Time: 09/26/2020  3:29 AM   Specimen: Nasopharyngeal Swab; Nasopharyngeal(NP) swabs in vial transport medium  Result Value Ref Range Status   SARS Coronavirus 2 by RT PCR NEGATIVE NEGATIVE Final    Comment: (NOTE) SARS-CoV-2 target nucleic acids are NOT DETECTED.  The SARS-CoV-2 RNA is generally detectable in upper respiratory specimens during the acute phase of infection. The lowest concentration of SARS-CoV-2 viral copies this assay can detect is 138 copies/mL. A negative result does not preclude SARS-Cov-2 infection and should not be used as the sole basis for treatment or other patient management decisions. A negative result may occur with  improper specimen collection/handling, submission of specimen other than nasopharyngeal swab, presence of viral mutation(s) within the areas targeted by this assay, and inadequate number of viral copies(<138 copies/mL). A negative result must be  combined with clinical observations, patient history, and epidemiological information. The expected result is Negative.  Fact Sheet for Patients:  EntrepreneurPulse.com.au  Fact Sheet for Healthcare Providers:  IncredibleEmployment.be  This test is no t yet approved or cleared by the [place] FDA and  has  been authorized for detection and/or diagnosis of SARS-CoV-2 by FDA under an Emergency Use Authorization (EUA). This EUA will remain  in effect (meaning this test can be used) for the duration of the COVID-19 declaration under Section 564(b)(1) of the Act, 21 U.S.C.section 360bbb-3(b)(1), unless the authorization is terminated  or revoked sooner.       Influenza A by PCR NEGATIVE NEGATIVE Final   Influenza B by PCR NEGATIVE NEGATIVE Final    Comment: (NOTE) The Xpert Xpress SARS-CoV-2/FLU/RSV plus assay is intended as an aid in the diagnosis of influenza from Nasopharyngeal swab specimens and should not be used as a sole basis for treatment. Nasal washings and aspirates are unacceptable for Xpert Xpress SARS-CoV-2/FLU/RSV testing.  Fact Sheet for Patients: EntrepreneurPulse.com.au  Fact Sheet for Healthcare Providers: IncredibleEmployment.be  This test is not yet approved or cleared by the [place] FDA and has been authorized for detection and/or diagnosis of SARS-CoV-2 by FDA under an Emergency Use Authorization (EUA). This EUA will remain in effect (meaning this test can be used) for the duration of the COVID-19 declaration under Section 564(b)(1) of the Act, 21 U.S.C. section 360bbb-3(b)(1), unless the authorization is terminated or revoked.  Performed at [facility], [place] [address] [place], [place] [address]   Blood culture (routine x 2)     Status: None   Collection Time: 09/24/2020  3:40 AM   Specimen: BLOOD RIGHT FOREARM  Result Value Ref Range Status   Specimen Description  BLOOD RIGHT FOREARM  Final   Special Requests   Final    BOTTLES DRAWN AEROBIC AND ANAEROBIC Blood Culture adequate volume   Culture   Final    NO GROWTH 5 DAYS Performed at [facility], [place] [address] [place], [place] [address]    Report Status 10/04/2020 FINAL  Final  Urine Culture     Status: Abnormal   Collection Time: 09/25/2020  5:16 AM   Specimen: Urine, Clean Catch  Result Value Ref Range Status   Specimen Description URINE, CLEAN CATCH  Final   Special Requests   Final    NONE Performed at [facility], [place] [address] [place], [place] [address]    Culture 50,000 COLONIES/mL PSEUDOMONAS AERUGINOSA (A)  Final   Report Status 10/01/2020 FINAL  Final   Organism ID, Bacteria PSEUDOMONAS AERUGINOSA (A)  Final      Susceptibility   Pseudomonas aeruginosa - MIC*    CEFTAZIDIME 4 SENSITIVE Sensitive     CIPROFLOXACIN <=0.25 SENSITIVE Sensitive     GENTAMICIN <=1 SENSITIVE Sensitive     IMIPENEM 2 SENSITIVE Sensitive     PIP/TAZO 8 SENSITIVE Sensitive     CEFEPIME 2 SENSITIVE Sensitive     * 50,000 COLONIES/mL PSEUDOMONAS AERUGINOSA  MRSA Next Gen by PCR, Nasal     Status: None   Collection Time: 10/02/20  8:05 AM   Specimen: Nasal Mucosa; Nasal Swab  Result Value Ref Range Status   MRSA by PCR Next Gen NOT DETECTED NOT DETECTED Final    Comment: (NOTE) The GeneXpert MRSA Assay (FDA approved for NASAL specimens only), is one component of a comprehensive MRSA colonization surveillance program. It is not intended to diagnose MRSA infection nor to guide or monitor treatment for MRSA infections. Test performance is not FDA approved in patients less than 39 years old. Performed at [facility], [place] [address], [place], [place] [address]       Studies:  DG Chest [place] 1  View  Result Date: 10/05/2020 CLINICAL DATA:  Healthcare associated pneumonia. EXAM: PORTABLE CHEST 1 VIEW COMPARISON:  10/04/2020 FINDINGS: Persistent bilateral airspace disease with  decreased densities in the right upper lung. Heart size is stable. Patient is slightly rotated towards left on these images. Blunting at both costophrenic angles are suggestive for pleural effusions, right side greater than left. Negative for a pneumothorax. IMPRESSION: 1. Persistent bilateral airspace disease. Decreased densities in the right upper lung. 2. Probable bilateral pleural effusions, right side greater than left. Electronically Signed   By: Markus Daft M.D.   On: 10/05/2020 07:50   DG CHEST PORT 1 VIEW  Result Date: 10/04/2020 CLINICAL DATA:  Hypoxia EXAM: PORTABLE CHEST 1 VIEW COMPARISON:  10/02/2020 FINDINGS: Severe diffuse bilateral airspace disease, worsening since prior study. Small to moderate bilateral effusions. Heart is normal size. IMPRESSION: Worsening bilateral airspace disease could reflect edema or infection. Small to moderate bilateral effusions. Electronically Signed   By: Rolm Baptise M.D.   On: 10/04/2020 19:54    Assessment: 87 y.o. [place] woman status post bilateral breast biopsies on 05/17/2019 as follows:             (a) on the left upper outer quadrant, a clinical T2 N2, stage IIIA invasive ductal carcinoma,grade 2 or 3, estrogen receptor positive, progesterone receptor negative, with no HER-2 amplification and an MIB-1 of 12%             (b) on the right overlapping sites, 2 morphologically distinct invasive ductal carcinomas, both clinically T1c N0, stage IA, grade 1 or 2, estrogen receptor positive, progesterone receptor negative, with no HER-2 amplification and an MIB-1 of 5%             (c) CT of the chest abdomen and pelvis 06/10/2019 shows left axillary, retropectoral and supraclavicular lymph nodes, and a 4.1 cm left breast mass; no right axillary adenopathy; 0.6 cm nonspecific left lower lobe pulmonary nodule; no liver or bone lesions.             (d) bone scan 06/26/2019--no evidence of skeletal metastases                          (1) neoadjuvant  anastrozole started 05/28/2019, palbociclib added 05/30/2019 125 mg a day, 21 on 7 off             (a) palbociclib dose decreased cycle 2 (May 2021) to 100 mg daily, 21/7             (b) bilateral breast and axilla ultrasonography 10/04/2019 shows a good initial response to treatment             (c) repeat bilateral ultrasonography 05/20/2020 continues to show evidence of response   (2) genetics testing declined   (3) definitive surgery to follow   (4) adjuvant radiation to follow   (5) immune thrombocytopenia (ITP)             (A) ADAMTS 13 was 63.0 on 09/09/2020             (B) status post IVIG 09/10/2020 and 09/11/2020             (C) prednisone 40 mg/ daily started 09/12/2018 stopped sometime the week of 09/21/2020  (D) prednisone 40 mg daily resumed 10/01/2020  (6) Pulmonary embolus noted on CTA on 10/05/2020  (A) Eliquis 45m BID began on 09/30/2020 and stopped on 10/04/2020  (B) Heparin infusion started 10/04/2020 due to  NPO status  Plan:   Michelle [name] was due to PE was started on Eliquis and was tolerating it well.  She developed worsening respiratory distress and hypoxia secondary to HCAP and has now been transferred to the ICU.  She developed worsening of her respiratory status last evening and required BiPAP.  Now on high flow oxygen.  She is being managed by the PCCM team.  Goals of care discussion per PCCM team last evening and patient is now DNR/DNI.  If patient does not improve, they would recommend transitioning patient to comfort measures only.  However, the patient does seem to be somewhat better today compared to last evening but still not nearly back to baseline.  Appreciate the excellent care she has received from the PCCM team.  From our standpoint, would recommend continuation of prednisone 40 mg daily for her ITP.  If unable to take p.o., okay to give IV Solu-Medrol.  She may transition back to Eliquis when safely taking p.o.  For her breast cancer, she should continue letrozole  if able to take p.o.  If not, okay to hold.  We will continue to hold Ibrance for now.  We will consider restarting as an outpatient when she recovers from her acute illness.  Mikey Bussing, NP 10/05/2020  1:27 PM Mikey Bussing, NP Medical Oncology and Hematology [facility] [address] [place], [place] [address] Tel. [phone]    Fax. [phone]  ADDENDUM:  Michelle [name]'s breast cancer is well-controlled and it is fine to hold her letrozole and palbociclib for now. Of more concern is her recent history of ITP. Appreciate your making sure she continues on steroids to avoid relapse, which would be particularly dangerous while under anticoagulation  Overall however she has had a rough course recently and I agree with the DNR/DNI order. We will follow peripherally.

## 2020-10-05 NOTE — Progress Notes (Signed)
Assisted tele visit to patient with son.  Thomas, Shimon [place] Renee, RN   

## 2020-10-05 NOTE — Progress Notes (Signed)
eLink Physician-Brief Progress Note Patient Name: Michelle [name] DOB: 11/10/1930 MRN: [id number]   Date of Service  10/05/2020  HPI/Events of Note  RN requesting one time dose of med for anxiety, has been coming on and off BiPAP and desats quickly.  HR 119 to 130.    eICU Interventions  Discussed with RN, would avoid benzos. Mid night labs reviewed is stable. Hg at 7.1 was 7.4 on 19 th.   Low dose precedex for now, on BiPAP.       Intervention Category Intermediate Interventions: Other:  Elmer Sow 10/05/2020, 4:28 AM

## 2020-10-05 NOTE — Progress Notes (Signed)
Pt. found with SpO2 in the 70's and breathing agonally after removing her high flow nasal cannula and non rebreather mask. Pt unable to follow commands at this time but responding to voice. Nurse at bedside bagging pt. Respiratory and Elink notified. Per report of day shift nurse, patient's daughter stated her mother would not want any aggressive treatment. Ground team sent to bedside to evaluate pt .

## 2020-10-06 DIAGNOSIS — Z862 Personal history of diseases of the blood and blood-forming organs and certain disorders involving the immune mechanism: Secondary | ICD-10-CM

## 2020-10-06 DIAGNOSIS — R6510 Systemic inflammatory response syndrome (SIRS) of non-infectious origin without acute organ dysfunction: Secondary | ICD-10-CM

## 2020-10-06 DIAGNOSIS — C50811 Malignant neoplasm of overlapping sites of right female breast: Secondary | ICD-10-CM

## 2020-10-06 LAB — PREPARE RBC (CROSSMATCH)

## 2020-10-06 LAB — CBC
HCT: 20.4 % — ABNORMAL LOW (ref 36.0–46.0)
Hemoglobin: 6.3 g/dL — CL (ref 12.0–15.0)
MCH: 30.6 pg (ref 26.0–34.0)
MCHC: 30.9 g/dL (ref 30.0–36.0)
MCV: 99 fL (ref 80.0–100.0)
Platelets: 325 10*3/uL (ref 150–400)
RBC: 2.06 MIL/uL — ABNORMAL LOW (ref 3.87–5.11)
RDW: 18.8 % — ABNORMAL HIGH (ref 11.5–15.5)
WBC: 23.8 10*3/uL — ABNORMAL HIGH (ref 4.0–10.5)
nRBC: 2.7 % — ABNORMAL HIGH (ref 0.0–0.2)

## 2020-10-06 LAB — LIPID PANEL
Cholesterol: 165 mg/dL (ref 0–200)
HDL: 40 mg/dL — ABNORMAL LOW (ref >40)
LDL Cholesterol: 104 mg/dL — ABNORMAL HIGH (ref 0–99)
Total CHOL/HDL Ratio: 4.1 RATIO
Triglycerides: 104 mg/dL (ref <150)
VLDL: 21 mg/dL (ref 0–40)

## 2020-10-06 LAB — APTT
aPTT: 118 seconds — ABNORMAL HIGH (ref 24–36)
aPTT: 59 seconds — ABNORMAL HIGH (ref 24–36)
aPTT: 84 seconds — ABNORMAL HIGH (ref 24–36)

## 2020-10-06 LAB — BASIC METABOLIC PANEL
Anion gap: 11 (ref 5–15)
Anion gap: 13 (ref 5–15)
BUN: 41 mg/dL — ABNORMAL HIGH (ref 8–23)
BUN: 46 mg/dL — ABNORMAL HIGH (ref 8–23)
CO2: 20 mmol/L — ABNORMAL LOW (ref 22–32)
CO2: 18 mmol/L — ABNORMAL LOW (ref 22–32)
Calcium: 8.4 mg/dL — ABNORMAL LOW (ref 8.9–10.3)
Calcium: 8.5 mg/dL — ABNORMAL LOW (ref 8.9–10.3)
Chloride: 112 mmol/L — ABNORMAL HIGH (ref 98–111)
Chloride: 111 mmol/L (ref 98–111)
Creatinine, Ser: 1.17 mg/dL — ABNORMAL HIGH (ref 0.44–1.00)
Creatinine, Ser: 1.32 mg/dL — ABNORMAL HIGH (ref 0.44–1.00)
GFR, Estimated: 45 mL/min — ABNORMAL LOW (ref >60)
GFR, Estimated: 39 mL/min — ABNORMAL LOW (ref >60)
Glucose, Bld: 165 mg/dL — ABNORMAL HIGH (ref 70–99)
Glucose, Bld: 178 mg/dL — ABNORMAL HIGH (ref 70–99)
Potassium: 4.3 mmol/L (ref 3.5–5.1)
Potassium: 4.1 mmol/L (ref 3.5–5.1)
Sodium: 143 mmol/L (ref 135–145)
Sodium: 142 mmol/L (ref 135–145)

## 2020-10-06 LAB — HEMOGLOBIN AND HEMATOCRIT, BLOOD
HCT: 25.8 % — ABNORMAL LOW (ref 36.0–46.0)
Hemoglobin: 8.3 g/dL — ABNORMAL LOW (ref 12.0–15.0)

## 2020-10-06 LAB — GLUCOSE, CAPILLARY
Glucose-Capillary: 179 mg/dL — ABNORMAL HIGH (ref 70–99)
Glucose-Capillary: 146 mg/dL — ABNORMAL HIGH (ref 70–99)
Glucose-Capillary: 114 mg/dL — ABNORMAL HIGH (ref 70–99)
Glucose-Capillary: 118 mg/dL — ABNORMAL HIGH (ref 70–99)
Glucose-Capillary: 114 mg/dL — ABNORMAL HIGH (ref 70–99)

## 2020-10-06 LAB — HEPARIN LEVEL (UNFRACTIONATED): Heparin Unfractionated: >1.1 IU/mL — ABNORMAL HIGH (ref 0.30–0.70)

## 2020-10-06 LAB — MAGNESIUM: Magnesium: 2.1 mg/dL (ref 1.7–2.4)

## 2020-10-06 MED ORDER — SODIUM CHLORIDE 0.9% IV SOLUTION: Freq: Once | INTRAVENOUS | Status: DC

## 2020-10-06 MED ORDER — PANTOPRAZOLE SODIUM 40 MG IV SOLR
40.0000 mg | INTRAVENOUS | Status: DC
  Administered 2020-10-06 – 2020-10-08 (×3): 40 mg via INTRAVENOUS
  Filled 2020-10-06 (×3): qty 40

## 2020-10-06 MED ORDER — LACTATED RINGERS IV BOLUS
500.0000 mL | Freq: Once | INTRAVENOUS | Status: AC
  Administered 2020-10-06: 500 mL via INTRAVENOUS

## 2020-10-06 MED ORDER — CALCIUM GLUCONATE-NACL 1-0.675 GM/50ML-% IV SOLN
1.0000 g | Freq: Once | INTRAVENOUS | Status: AC
  Administered 2020-10-06: 1000 mg via INTRAVENOUS
  Filled 2020-10-06: qty 50

## 2020-10-06 MED ORDER — SODIUM CHLORIDE 0.9 % IV SOLN
1.0000 g | Freq: Two times a day (BID) | INTRAVENOUS | Status: AC
  Administered 2020-10-06 – 2020-10-07 (×4): 1 g via INTRAVENOUS
  Filled 2020-10-06 (×4): qty 1

## 2020-10-06 NOTE — Progress Notes (Signed)
[place] Progress Note Patient Name: Michelle [name] DOB: 04/27/1930 MRN: [id number]   Date of Service  10/06/2020  HPI/Events of Note  Hgb 6.3  want to order blood?  HGb was 7.1 yesterday. Nurse reported formed stool tonight that was dark. On a heparin gtt. Plt > 300K  Discussed with RN.  Not on pressors, soft MAP 64. HR is ok.  eICU Interventions  - continue heparin for PE. On prednisone also. - 2 Type and screen, transfuse one unit. Start Protonix as SUP.       Intervention Category Intermediate Interventions: Other:  Elmer Sow 10/06/2020, 5:57 AM

## 2020-10-06 NOTE — Progress Notes (Addendum)
[place] for apixaban>>heparin Indication: pulmonary embolus  No Known Allergies  Patient Measurements: ~50 kg  Vital Signs: Temp: 97 F (36.1 C) (08/23 0942) Temp Source: Axillary (08/23 0942) BP: 108/43 (08/23 0942) Pulse Rate: 111 (08/23 0942)  Labs: Recent Labs    10/04/20 0344 10/05/20 0128 10/05/20 1057 10/05/20 2222 10/06/20 0410  HGB 7.4* 7.1*  --   --  6.3*  HCT 23.0* 23.6*  --   --  20.4*  PLT 288 308  --   --  325  APTT  --  63* 111* 118*  --   LABPROT  --  31.2*  --   --   --   INR  --  3.0*  --   --   --   HEPARINUNFRC  --   --   --  >1.10*  --   CREATININE 0.89 1.00  --  1.17* 1.32*     Estimated Creatinine Clearance: 21.9 mL/min (A) (by C-G formula based on SCr of 1.32 mg/dL (H)).   Medical History: Past Medical History:  Diagnosis Date   Arthritis    Cancer ([place])    Bilaterial Breast Cancer   Dyspnea    GERD (gastroesophageal reflux disease)    Hypertension    Stroke [facility])    "mini stroke" per patient     Assessment: 87 y/o F with history of breast cancer, found to have small PE 09/25/2020. No anticoagulation prior to admission. Pharmacy consulted for heparin then changed to eliquis from 8/17-8/21 AM.  Pt now NPO, pharmacy has been consulted for heparin dosing.    aPTT subtherapeutic 8/23 AM at 59 with heparin running at 550 U/h. Hgb dropped from 7.1>6.3 (8/22>8/23). Nurse noted 8/23 AM there was some red streaks in a formed stool and some minor oozing around one of her PIV's, but otherwise no signs of bleeding noted. PLT remain WNL. Patient received 1U PRBCs and started on SUP with protonix 8/23. Notably, patient has an elevated INR (3.0) from 8/22. Previous INR was 1.1 on 09/08/20. Neither apixaban or heparin should cause this increase in INR. LFTs/INR ordered for 8/24 AM. Discussed the case with Dr. Gilford Rile, and he is OK with continuing heparin despite red streaks in stool and Hgb drop.     Goal of  Therapy:  Heparin level 0.3-0.7 units/ml, aPTT 66-102 sec Monitor platelets by anticoagulation protocol: Yes   Plan:   - Increase heparin to 600 units/h  - Check aPTT in 8 hours - Daily aPTT, HL and CBC - Monitor for signs and symptoms of bleeding   Thank you for allowing pharmacy to be a part of this patient's care.  Adria Dill, PharmD PGY-1 Acute Care Resident  10/06/2020 9:53 AM

## 2020-10-06 NOTE — Progress Notes (Signed)
NAME:  Michelle [name], MRN:  [id number], DOB:  1930-04-29, LOS: 7 ADMISSION DATE:  09/15/2020, CONSULTATION DATE:  10/01/2020 REFERRING MD:  Dr. Sloan Leiter, CHIEF COMPLAINT:  Respiratory Distress    History of Present Illness:  87 yr old female presented to ER with chest pain and dyspnea.  Found to have small PE and started on heparin gtt.  Urine culture positive for Pseudomonas.  Developed worsening hypoxia and tachycardia from HCAP and transferred to ICU.  She had admission in July 2022 for sepsis from Aerococcus bacteremia, and acute ITP.  Pertinent  Medical History  HTN, Breast CA on ibrance, GERD, CVA  Significant Hospital Events: Including procedures, antibiotic start and stop dates in addition to other pertinent events   8/16 admitted to ED 8/18 transferred to ICU  8/21 Made DNR after family discussion 8/23 Hgb 6.3, transfuse with 2 units of PRBC  Interim History / Subjective:  O/N Events: Hgb 6.3, Transfused  with 2 units PRBC  On BiPAP, minimally responsive  Objective   Blood pressure (!) 108/43, pulse (!) 111, temperature (!) 97 F (36.1 C), temperature source Axillary, resp. rate (!) 37, height '5\' 1"'$  (1.549 m), weight 47.1 kg, SpO2 92 %.    Vent Mode: PCV;BIPAP FiO2 (%):  [60 %-100 %] 70 % Set Rate:  [10 bmp-12 bmp] 10 bmp PEEP:  [6 cmH20-7 cmH20] 6 cmH20   Intake/Output Summary (Last 24 hours) at 10/06/2020 1030 Last data filed at 10/06/2020 0800 Gross per 24 hour  Intake 1804.21 ml  Output 182.8 ml  Net 1621.41 ml   Filed Weights   10/13/2020 0700  Weight: 47.1 kg    Examination: Physical Exam Constitutional:      General: She is in acute distress.     Appearance: She is ill-appearing.     Comments: Acutely ill appearing, minimally responsive  Cardiovascular:     Rate and Rhythm: Normal rate and regular rhythm.     Pulses: Normal pulses.     Heart sounds: Normal heart sounds. No murmur heard.   No friction rub. No gallop.  Pulmonary:     Breath sounds:  Rhonchi present. No wheezing or rales.     Comments: On BiPAP, tachypneic with RR 40, coarse BS appreciated bilaterally Chest:     Chest wall: No tenderness.  Abdominal:     General: Abdomen is flat. Bowel sounds are normal.     Palpations: Abdomen is soft.     Tenderness: There is no abdominal tenderness.  Musculoskeletal:     Right lower leg: No edema.     Left lower leg: No edema.     Resolved Hospital Problem list   Elevated troponin from demand ischemia 2nd to PE  Assessment & Plan:   Acute Hypoxic Respiratory Failure form HCAP an Acute Pulmonary Embolism:  - Goal SpO2 > 90 to 95%; using high flow oxygen - BiPAP PRN - Continue heparin   HCAP, Pseudomonal UTI:  Given her encephalopathy, likely due to her hypoxia 2/2 to PE and HCAP, will transition to meropenem to avoid cefepime induced encephalopathy.  - Meropenem per pharmacy  - BC Negative  Hx of Breast Cancer:  - Follows with Dr. [name] outpt - Continue Arimidex  - Hold Ibrance in setting of infection  - Oncology following  Anemia of critical illness and chronic disease: Hgb this AM is 6.3, transfusing with 1 unit PRBC.  - F/U Post transfusion H&H - Trend CBC - Transfuse for Hb < 7  Acute Kidney  Injury in the Setting of Critical Illness:  sCR increased to 1.32 received several boluses with no improvement in output.  - Trend BMP  - Avoid nephrotoxic agents  ITP:  - Prednisone 40 mg daily  Steroid Induced Hyperglycemia:  - SSI  Goals of care. - Made DNR 10/04/20 - Family discussion on 10/05/20, per [place] discussion, we will continue with aggressive care at this time, but Ms. Mcconnaughey continues to be DNR/DNI.   Best Practice (right click and "Reselect all SmartList Selections" daily)  Diet/type: heart healthy DVT prophylaxis: Heparin GI prophylaxis: not indicated Code Status:  full code Last date of multidisciplinary goals of care discussion:    Labs    CMP Latest Ref Rng & Units 10/06/2020 10/05/2020  10/05/2020  Glucose 70 - 99 mg/dL 178(H) 165(H) 157(H)  BUN 8 - 23 mg/dL 46(H) 41(H) 30(H)  Creatinine 0.44 - 1.00 mg/dL 1.32(H) 1.17(H) 1.00  Sodium 135 - 145 mmol/L 142 143 140  Potassium 3.5 - 5.1 mmol/L 4.1 4.3 3.8  Chloride 98 - 111 mmol/L 111 112(H) 108  CO2 22 - 32 mmol/L 18(L) 20(L) 20(L)  Calcium 8.9 - 10.3 mg/dL 8.5(L) 8.4(L) 8.8(L)  Total Protein 6.5 - 8.1 g/dL - - -  Total Bilirubin 0.3 - 1.2 mg/dL - - -  Alkaline Phos 38 - 126 U/L - - -  AST 15 - 41 U/L - - -  ALT 0 - 44 U/L - - -    CBC Latest Ref Rng & Units 10/06/2020 10/05/2020 10/04/2020  WBC 4.0 - 10.5 K/uL 23.8(H) 24.3(H) 20.6(H)  Hemoglobin 12.0 - 15.0 g/dL 6.3(LL) 7.1(L) 7.4(L)  Hematocrit 36.0 - 46.0 % 20.4(L) 23.6(L) 23.0(L)  Platelets 150 - 400 K/uL 325 308 288    ABG    Component Value Date/Time   PHART 7.503 (H) 10/02/2020 0307   PCO2ART 34.1 10/02/2020 0307   PO2ART 81 (L) 10/02/2020 0307   HCO3 26.9 10/02/2020 0307   TCO2 28 10/02/2020 0307   ACIDBASEDEF 3.0 (H) 10/01/2020 1520   O2SAT 97.0 10/02/2020 0307   CBG (last 3)  Recent Labs    10/05/20 1517 10/05/20 2047 10/06/20 [place]  Maudie Mercury, MD IMTS, PGY-3 Pager: [phone] 10/06/2020,10:30 AM

## 2020-10-06 NOTE — Progress Notes (Signed)
ANTICOAGULATION CONSULT NOTE   Pharmacy Consult for apixaban>>heparin Indication: pulmonary embolus  No Known Allergies  Patient Measurements: ~50 kg  Vital Signs: Temp: 98.1 F (36.7 C) (08/22 2353) Temp Source: Axillary (08/22 2353) BP: 100/61 (08/23 0031) Pulse Rate: 114 (08/23 0031)  Labs: Recent Labs    10/04/20 0344 10/05/20 0128 10/05/20 1057 10/05/20 2222  HGB 7.4* 7.1*  --   --   HCT 23.0* 23.6*  --   --   PLT 288 308  --   --   APTT  --  63* 111* 118*  LABPROT  --  31.2*  --   --   INR  --  3.0*  --   --   HEPARINUNFRC  --   --   --  >1.10*  CREATININE 0.89 1.00  --  1.17*     Estimated Creatinine Clearance: 24.7 mL/min (A) (by C-G formula based on SCr of 1.17 mg/dL (H)).   Medical History: Past Medical History:  Diagnosis Date   Arthritis    Cancer ([place])    Bilaterial Breast Cancer   Dyspnea    GERD (gastroesophageal reflux disease)    Hypertension    Stroke [facility])    "mini stroke" per patient    Assessment: 87 y/o F with history of breast cancer, found to have small PE 09/17/2020. No anticoagulation prior to admission. Pharmacy consulted for heparin then changed to eliquis from 8/17-8/21 AM.  Pt now NPO, pharmacy has been consulted for heparin dosing.   Heparin level >1.1 (affected by Eliquis). aPTT remains supratherapeutic (118 sec) on heparin at 650 units/hr. Per RN, no signs of bleeding or issues with infusion.    Goal of Therapy:  Heparin level 0.3-0.7 units/ml, aPTT 66-102 sec Monitor platelets by anticoagulation protocol: Yes   Plan:   Decrease heparin to 550 units/h  Check aPTT in 8 hours  Sherlon Handing, PharmD, BCPS Please see amion for complete clinical pharmacist phone list 10/06/2020 1:21 AM

## 2020-10-06 NOTE — Progress Notes (Signed)
OT Cancellation Note  Patient Details Name: Michelle [name] MRN: [id number] DOB: 06-Jan-1931   Cancelled Treatment:    Reason Eval/Treat Not Completed: Medical issues which prohibited therapy. Patient not following commands. Hbg 6.3, was 7.1 yesterday, MAP of 64 (not on pressors), and on BiPAP with FiO2 80%. OT to sign off at this time. Please place new orders as appropriate.   Gloris Manchester OTR/L Supplemental OT, Department of rehab services [phone]  Ilisha Blust R H. 10/06/2020, 7:00 AM

## 2020-10-06 NOTE — Plan of Care (Signed)
  Problem: Education: Goal: Knowledge of General Education information will improve Description: Including pain rating scale, medication(s)/side effects and non-pharmacologic comfort measures Outcome: Progressing   Problem: Clinical Measurements: Goal: Ability to maintain clinical measurements within normal limits will improve Outcome: Progressing Goal: Will remain free from infection Outcome: Progressing Goal: Diagnostic test results will improve Outcome: Progressing Goal: Cardiovascular complication will be avoided Outcome: Progressing   Problem: Activity: Goal: Risk for activity intolerance will decrease Outcome: Progressing   Problem: Coping: Goal: Level of anxiety will decrease Outcome: Progressing   Problem: Elimination: Goal: Will not experience complications related to bowel motility Outcome: Progressing Goal: Will not experience complications related to urinary retention Outcome: Progressing   Problem: Pain Managment: Goal: General experience of comfort will improve Outcome: Progressing   Problem: Safety: Goal: Ability to remain free from injury will improve Outcome: Progressing   Problem: Skin Integrity: Goal: Risk for impaired skin integrity will decrease Outcome: Progressing

## 2020-10-06 NOTE — Progress Notes (Signed)
ANTICOAGULATION CONSULT NOTE  Pharmacy Consult:  Heparin Indication: pulmonary embolus  No Known Allergies  Patient Measurements: ~50 kg  Vital Signs: Temp: 98.2 F (36.8 C) (08/23 1919) Temp Source: Axillary (08/23 1919) BP: 130/88 (08/23 1815) Pulse Rate: 108 (08/23 1927)  Labs: Recent Labs    10/04/20 0344 10/05/20 0128 10/05/20 1057 10/05/20 2222 10/06/20 0410 10/06/20 1052 10/06/20 1321 10/06/20 2015  HGB 7.4* 7.1*  --   --  6.3*  --  8.3*  --   HCT 23.0* 23.6*  --   --  20.4*  --  25.8*  --   PLT 288 308  --   --  325  --   --   --   APTT  --  63*   < > 118*  --  59*  --  84*  LABPROT  --  31.2*  --   --   --   --   --   --   INR  --  3.0*  --   --   --   --   --   --   HEPARINUNFRC  --   --   --  >1.10*  --   --   --   --   CREATININE 0.89 1.00  --  1.17* 1.32*  --   --   --    < > = values in this interval not displayed.     Estimated Creatinine Clearance: 21.9 mL/min (A) (by C-G formula based on SCr of 1.32 mg/dL (H)).   Assessment: 87 y/o F with history of breast cancer, found to have small PE 10/02/2020. No anticoagulation prior to admission. Pharmacy consulted for heparin then changed to eliquis from 8/17-8/21 AM.  Pt now NPO, pharmacy has been consulted for heparin dosing.   Hgb dropped from 7.1>6.3 (8/22>8/23) and RN noted there was some red streaks in a formed stool and some minor oozing around one of her PIV's, but otherwise no signs of bleeding noted.  Discussed the case with Dr. Gilford Rile, and he is OK with continuing heparin despite red streaks in stool and Hgb drop.  aPTT therapeutic at 84 sec.  RN reports no overt bleeding nor complication with infusion.  Goal of Therapy:  Heparin level 0.3-0.7 units/ml, aPTT 66-102 sec Monitor platelets by anticoagulation protocol: Yes   Plan:   - Continue heparin gtt at 600 units/hr - Daily aPTT, HL and CBC - Monitor for signs and symptoms of bleeding   Timmey Lamba D. Mina Marble, PharmD, BCPS, [place] 10/06/2020, 9:32  PM

## 2020-10-07 ENCOUNTER — Encounter: Payer: Self-pay | Admitting: Oncology

## 2020-10-07 ENCOUNTER — Inpatient Hospital Stay (HOSPITAL_COMMUNITY): Payer: Medicare HMO

## 2020-10-07 ENCOUNTER — Inpatient Hospital Stay: Payer: Self-pay

## 2020-10-07 DIAGNOSIS — I2699 Other pulmonary embolism without acute cor pulmonale: Secondary | ICD-10-CM | POA: Diagnosis not present

## 2020-10-07 DIAGNOSIS — Z170 Estrogen receptor positive status [ER+]: Secondary | ICD-10-CM

## 2020-10-07 DIAGNOSIS — Z7189 Other specified counseling: Secondary | ICD-10-CM | POA: Diagnosis not present

## 2020-10-07 DIAGNOSIS — Z515 Encounter for palliative care: Secondary | ICD-10-CM | POA: Diagnosis not present

## 2020-10-07 DIAGNOSIS — J189 Pneumonia, unspecified organism: Secondary | ICD-10-CM | POA: Diagnosis not present

## 2020-10-07 LAB — CBC
HCT: 27.4 % — ABNORMAL LOW (ref 36.0–46.0)
Hemoglobin: 8.5 g/dL — ABNORMAL LOW (ref 12.0–15.0)
MCH: 30.4 pg (ref 26.0–34.0)
MCHC: 31 g/dL (ref 30.0–36.0)
MCV: 97.9 fL (ref 80.0–100.0)
Platelets: 268 10*3/uL (ref 150–400)
RBC: 2.8 MIL/uL — ABNORMAL LOW (ref 3.87–5.11)
RDW: 17.9 % — ABNORMAL HIGH (ref 11.5–15.5)
WBC: 23.4 10*3/uL — ABNORMAL HIGH (ref 4.0–10.5)
nRBC: 15.9 % — ABNORMAL HIGH (ref 0.0–0.2)

## 2020-10-07 LAB — COMPREHENSIVE METABOLIC PANEL
ALT: 701 U/L — ABNORMAL HIGH (ref 0–44)
AST: 1025 U/L — ABNORMAL HIGH (ref 15–41)
Albumin: 2.1 g/dL — ABNORMAL LOW (ref 3.5–5.0)
Alkaline Phosphatase: 117 U/L (ref 38–126)
Anion gap: 13 (ref 5–15)
BUN: 55 mg/dL — ABNORMAL HIGH (ref 8–23)
CO2: 17 mmol/L — ABNORMAL LOW (ref 22–32)
Calcium: 8.4 mg/dL — ABNORMAL LOW (ref 8.9–10.3)
Chloride: 117 mmol/L — ABNORMAL HIGH (ref 98–111)
Creatinine, Ser: 1.33 mg/dL — ABNORMAL HIGH (ref 0.44–1.00)
GFR, Estimated: 38 mL/min — ABNORMAL LOW (ref >60)
Glucose, Bld: 134 mg/dL — ABNORMAL HIGH (ref 70–99)
Potassium: 4.5 mmol/L (ref 3.5–5.1)
Sodium: 147 mmol/L — ABNORMAL HIGH (ref 135–145)
Total Bilirubin: 1.7 mg/dL — ABNORMAL HIGH (ref 0.3–1.2)
Total Protein: 5.5 g/dL — ABNORMAL LOW (ref 6.5–8.1)

## 2020-10-07 LAB — PROTIME-INR
INR: 2.4 — ABNORMAL HIGH (ref 0.8–1.2)
Prothrombin Time: 26.1 seconds — ABNORMAL HIGH (ref 11.4–15.2)

## 2020-10-07 LAB — GLUCOSE, CAPILLARY
Glucose-Capillary: 128 mg/dL — ABNORMAL HIGH (ref 70–99)
Glucose-Capillary: 135 mg/dL — ABNORMAL HIGH (ref 70–99)
Glucose-Capillary: 142 mg/dL — ABNORMAL HIGH (ref 70–99)
Glucose-Capillary: 137 mg/dL — ABNORMAL HIGH (ref 70–99)
Glucose-Capillary: 127 mg/dL — ABNORMAL HIGH (ref 70–99)

## 2020-10-07 LAB — APTT
aPTT: 55 seconds — ABNORMAL HIGH (ref 24–36)
aPTT: 64 seconds — ABNORMAL HIGH (ref 24–36)

## 2020-10-07 LAB — HEPARIN LEVEL (UNFRACTIONATED): Heparin Unfractionated: >1.1 IU/mL — ABNORMAL HIGH (ref 0.30–0.70)

## 2020-10-07 MED ORDER — SODIUM CHLORIDE 0.9% FLUSH
10.0000 mL | INTRAVENOUS | Status: DC | PRN
  Administered 2020-10-09: 10 mL

## 2020-10-07 MED ORDER — MORPHINE SULFATE (PF) 2 MG/ML IV SOLN: 0.5000 mg | INTRAVENOUS | Status: AC | PRN

## 2020-10-07 MED ORDER — SODIUM CHLORIDE 0.9% FLUSH
10.0000 mL | Freq: Two times a day (BID) | INTRAVENOUS | Status: DC
  Administered 2020-10-07 – 2020-10-10 (×6): 10 mL

## 2020-10-07 MED ORDER — NOREPINEPHRINE 4 MG/250ML-% IV SOLN
2.0000 ug/min | INTRAVENOUS | Status: DC
  Administered 2020-10-07: 4 ug/min via INTRAVENOUS
  Administered 2020-10-08: 3 ug/min via INTRAVENOUS
  Filled 2020-10-07 (×2): qty 250

## 2020-10-07 MED ORDER — SODIUM CHLORIDE 0.9 % IV SOLN: 250.0000 mL | INTRAVENOUS | Status: DC

## 2020-10-07 NOTE — Progress Notes (Signed)
[place] Progress Note Patient Name: Michelle [name] DOB: December 24, 1930 MRN: [id number]   Date of Service  10/07/2020  HPI/Events of Note  Patient is DNR, on BIPAP, with air hunger.  eICU Interventions  Morphine 0.5 to 1 mg iv bolus PRN pain or air hunger ordered.        [name] [name] 10/07/2020, 2:59 AM

## 2020-10-07 NOTE — Consult Note (Signed)
Consultation Note Date: 10/07/2020   Patient Name: Michelle [name]  DOB: 1931/01/08  MRN: [id number]  Age / Sex: 87 y.o., female  PCP: [name], MD Referring Physician: Laurin Coder, MD  Reason for Consultation: Establishing goals of care  HPI/Patient Profile: 87 y.o. female  with past medical history of hypertension, breast cancer on ibrance, and GERD admitted on 09/16/2020 with chest pain and dyspnea. Found to have small PE and started on heparin gtt.  Urine culture positive for Pseudomonas.  Developed worsening hypoxia and tachycardia from HCAP and transferred to ICU. Was made DNR 8/21 after CCM discussion with family. Required bipap for several days - small doses of sedation needed for bipap tolerance. Remains unable to wean from bipap. PMT consulted to discuss [place].  Clinical Assessment and Goals of Care: I have reviewed medical records including EPIC notes, labs and imaging, received report from RN and Dr. Ander Slade, assessed the patient and then spoke with patient's son [name] and daughter [name] to discuss diagnosis prognosis, [place], EOL wishes, disposition and options.  RN reports patient unable to tolerate any time off of bipap - any small amounts of time for mouthcare are not tolerated well - desats. Patient with minimal interaction - does not follow commands.   With [name] and [name], I introduced Palliative Medicine as specialized medical care for people living with serious illness. It focuses on providing relief from the symptoms and stress of a serious illness. The goal is to improve quality of life for both the patient and the family.  [name] and I discussed that patient was not improving despite optimal treatment, multiple days of bipap. [name] expresses understanding. We briefly discuss a shift in care to focus more on comfort. [name] tells me she understands this recommendation. She asks me to speak with her brother [name].    [name] and I discussed same as above - [name] is tearful throughout conversation. He tells me he feels that his mother is suffering and does not want to prolong her suffering. He agrees that a shift to comfort measures is appropriate.   [name] shares that he would like his brother Jerrye Beavers to be included in conversation as he would like all 3 children to be able to come to the same decision together. He tells me he would be available for a family member tomorrow around 3 pm.   Questions and concerns were addressed. The family was encouraged to call with questions or concerns.   Primary Decision Maker NEXT OF KIN - 3 adult children   SUMMARY OF RECOMMENDATIONS   - 2 out of 3 children agree to shift in comfort measures, pending family discussion to include third brother - plan to meet with 3 children together (1 via speaker phone) 8/25 around 3 pm - continue current measures for now  Code Status/Advance Care Planning: DNR      Primary Diagnoses: Present on Admission:  Acute pulmonary embolism without acute cor pulmonale (HCC)  SIRS (systemic inflammatory response syndrome) (HCC)  Transient hypotension  Elevated troponin  Dysuria  Arthritis  Anemia  Acute lower UTI   I have reviewed the medical record, interviewed the patient and family, and examined the patient. The following aspects are pertinent.  Past Medical History:  Diagnosis Date   Arthritis    Cancer ([place])    Bilaterial Breast Cancer   Dyspnea    GERD (gastroesophageal reflux disease)    Hypertension    Stroke [facility])    "mini stroke" per patient  Social History   Socioeconomic History   Marital status: Widowed    Spouse name: Not on file   Number of children: Not on file   Years of education: Not on file   Highest education level: Not on file  Occupational History   Not on file  Tobacco Use   Smoking status: Never   Smokeless tobacco: Never  Vaping Use   Vaping Use: Never used  Substance and Sexual  Activity   Alcohol use: No   Drug use: No    Frequency: 3.0 times per week   Sexual activity: Not on file  Other Topics Concern   Not on file  Social History Narrative   Not on file   Social Determinants of Health   Financial Resource Strain: Not on file  Food Insecurity: Not on file  Transportation Needs: Not on file  Physical Activity: Not on file  Stress: Not on file  Social Connections: Not on file   Family History  Adopted: Yes   Scheduled Meds:  sodium chloride   Intravenous Once   Chlorhexidine Gluconate Cloth  6 each Topical Daily   feeding supplement  237 mL Oral BID BM   furosemide  40 mg Intravenous Once   insulin aspart  0-15 Units Subcutaneous TID WC   insulin aspart  0-5 Units Subcutaneous QHS   mouth rinse  15 mL Mouth Rinse BID   methylPREDNISolone (SOLU-MEDROL) injection  30 mg Intravenous Q breakfast   Or   predniSONE  40 mg Oral Q breakfast   mirtazapine  15 mg Per Tube QHS   pantoprazole (PROTONIX) IV  40 mg Intravenous Q24H   senna-docusate  1 tablet Per Tube QHS   sodium chloride flush  3 mL Intravenous Q12H   triamcinolone ointment  1 application Topical Daily   Continuous Infusions:  sodium chloride 10 mL/hr at 10/07/20 1500   sodium chloride     dexmedetomidine (PRECEDEX) IV infusion 0.3 mcg/kg/hr (10/07/20 1500)   heparin 650 Units/hr (10/07/20 1500)   lactated ringers 75 mL/hr at 10/07/20 1500   meropenem (MERREM) IV Stopped (10/07/20 0954)   norepinephrine (LEVOPHED) Adult infusion 3 mcg/min (10/07/20 1500)   PRN Meds:.acetaminophen **OR** acetaminophen, albuterol, HYDROcodone-acetaminophen, morphine injection, nitroGLYCERIN, nitroGLYCERIN, ondansetron **OR** ondansetron (ZOFRAN) IV No Known Allergies Review of Systems  Unable to perform ROS: Mental status change   Physical Exam Constitutional:      Comments: Unable to follow commands  Cardiovascular:     Rate and Rhythm: Normal rate.  Pulmonary:     Comments: Remains on  bipap   Vital Signs: BP (!) 119/43   Pulse (!) 103   Temp 98.1 F (36.7 C) (Axillary)   Resp (!) 26   Ht '5\' 1"'$  (1.549 m)   Wt 55.8 kg   SpO2 100%   BMI 23.24 kg/m  Pain Scale: CPOT   Pain Score: 0-No pain   SpO2: SpO2: 100 % O2 Device:SpO2: 100 % O2 Flow Rate: .O2 Flow Rate (L/min): 40 L/min  IO: Intake/output summary:  Intake/Output Summary (Last 24 hours) at 10/07/2020 1540 Last data filed at 10/07/2020 1500 Gross per 24 hour  Intake 3754.27 ml  Output 300 ml  Net 3454.27 ml    LBM: Last BM Date: 10/06/20 Baseline Weight: Weight: 47.1 kg Most recent weight: Weight: 55.8 kg     Palliative Assessment/Data: PPS 10%    Time Total: 55 minutes Greater than 50%  of this time was spent counseling and coordinating care related to the  above assessment and plan.  Juel Burrow, DNP, AGNP-C Palliative Medicine Team [phone] Pager: [phone]

## 2020-10-07 NOTE — Progress Notes (Signed)
Updated patients son Sayge Eargle  Continue current level of support

## 2020-10-07 NOTE — Progress Notes (Signed)
ANTICOAGULATION CONSULT NOTE  Pharmacy Consult:  Heparin Indication: pulmonary embolus  No Known Allergies  Patient Measurements: ~50 kg  Vital Signs: Temp: 98.1 F (36.7 C) (08/24 2017) Temp Source: Axillary (08/24 2017) BP: 142/50 (08/24 1900) Pulse Rate: 98 (08/24 1900)  Labs: Recent Labs    10/05/20 0128 10/05/20 1057 10/05/20 2222 10/06/20 0410 10/06/20 1052 10/06/20 1321 10/06/20 2015 10/07/20 0220 10/07/20 1945  HGB 7.1*  --   --  6.3*  --  8.3*  --  8.5*  --   HCT 23.6*  --   --  20.4*  --  25.8*  --  27.4*  --   PLT 308  --   --  325  --   --   --  268  --   APTT 63*   < > 118*  --    < >  --  84* 55* 64*  LABPROT 31.2*  --   --   --   --   --   --  26.1*  --   INR 3.0*  --   --   --   --   --   --  2.4*  --   HEPARINUNFRC  --   --  >1.10*  --   --   --   --  >1.10*  --   CREATININE 1.00  --  1.17* 1.32*  --   --   --  1.33*  --    < > = values in this interval not displayed.     Estimated Creatinine Clearance: 22.1 mL/min (A) (by C-G formula based on SCr of 1.33 mg/dL (H)).   Assessment: 87 y/o F with history of breast cancer, found to have small PE 09/23/2020. No anticoagulation prior to admission. Pharmacy consulted for heparin then changed to eliquis from 8/17-8/21 AM.  Pt now NPO, pharmacy has been consulted for heparin dosing.   Red streaks in a formed stool and minor oozing around one of patient's pIV have resolved.  aPTT slightly sub-therapeutic at 64 sec; no issue with heparin infusion per RN.  Goal of Therapy:  Heparin level 0.3-0.7 units/ml, aPTT 66-102 sec Monitor platelets by anticoagulation protocol: Yes   Plan:   Increase heparin infusion to 700 units/hr F/U AM labs Monitor for signs and symptoms of bleeding   Dulce Martian D. Mina Marble, PharmD, BCPS, [place] 10/07/2020, 9:12 PM

## 2020-10-07 NOTE — Progress Notes (Signed)
Peripherally Inserted Central Catheter Placement  The IV Nurse has discussed with the patient and/or persons authorized to consent for the patient, the purpose of this procedure and the potential benefits and risks involved with this procedure.  The benefits include less needle sticks, lab draws from the catheter, and the patient may be discharged home with the catheter. Risks include, but not limited to, infection, bleeding, blood clot (thrombus formation), and puncture of an artery; nerve damage and irregular heartbeat and possibility to perform a PICC exchange if needed/ordered by physician.  Alternatives to this procedure were also discussed.  Bard Power PICC patient education guide, fact sheet on infection prevention and patient information card has been provided to patient /or left at bedside.    PICC Placement Documentation  PICC Double Lumen 10/07/20 PICC Left Brachial 36 cm 0 cm (Active)  Indication for Insertion or Continuance of Line Vasoactive infusions 10/07/20 1527  Exposed Catheter (cm) 0 cm 10/07/20 1527  Site Assessment Clean;Dry;Intact 10/07/20 1527  Lumen #1 Status Flushed;Blood return noted;Saline locked 10/07/20 1527  Lumen #2 Status Flushed;Blood return noted;Saline locked 10/07/20 1527  Dressing Type Transparent 10/07/20 1527  Dressing Status Clean;Dry;Intact 10/07/20 1527  Antimicrobial disc in place? Yes 10/07/20 1527  Dressing Change Due 10/14/20 10/07/20 1527       Scotty Court 10/07/2020, 3:30 PM

## 2020-10-07 NOTE — Progress Notes (Signed)
ANTICOAGULATION CONSULT NOTE -Follow-Up  Pharmacy Consult for apixaban>>heparin Indication: pulmonary embolus  No Known Allergies  Patient Measurements: ~50 kg  Vital Signs: Temp: 98.2 F (36.8 C) (08/24 0734) Temp Source: Axillary (08/24 0734) BP: 103/40 (08/24 0700) Pulse Rate: 101 (08/24 0743)  Labs: Recent Labs    10/05/20 0128 10/05/20 1057 10/05/20 2222 10/06/20 0410 10/06/20 1052 10/06/20 1321 10/06/20 2015 10/07/20 0220  HGB 7.1*  --   --  6.3*  --  8.3*  --  8.5*  HCT 23.6*  --   --  20.4*  --  25.8*  --  27.4*  PLT 308  --   --  325  --   --   --  268  APTT 63*   < > 118*  --  59*  --  84* 55*  LABPROT 31.2*  --   --   --   --   --   --  26.1*  INR 3.0*  --   --   --   --   --   --  2.4*  HEPARINUNFRC  --   --  >1.10*  --   --   --   --  >1.10*  CREATININE 1.00  --  1.17* 1.32*  --   --   --  1.33*   < > = values in this interval not displayed.     Estimated Creatinine Clearance: 22.1 mL/min (A) (by C-G formula based on SCr of 1.33 mg/dL (H)).   Medical History: Past Medical History:  Diagnosis Date   Arthritis    Cancer ([place])    Bilaterial Breast Cancer   Dyspnea    GERD (gastroesophageal reflux disease)    Hypertension    Stroke [facility])    "mini stroke" per patient     Assessment: 87 y/o F with history of breast cancer, found to have small PE 10/12/2020. No anticoagulation prior to admission. Pharmacy consulted for heparin then changed to eliquis from 8/17-8/21 AM.  Pt now NPO on BiPAP, pharmacy has been consulted for heparin dosing.    aPTT subtherapeutic 8/24 AM at 55 with heparin running at 600 U/h. Hgb stable at 8.5 after receiving 1U PRBCs and started on SUP with protonix 8/23. Nurse noted 8/23 AM there was some red streaks in a formed stool and some minor oozing around one of her PIV's, but otherwise no signs of bleeding noted 8/24. PLT remain WNL, but trending down. Notably, patient had an elevated INR (3.0) from 8/22 which decreased to 2.4  on 8/24. LFTs came back elevated 8/24 and as aPTT has been highly variable with fairly stable heparin doses, there is high suspicion the patient had an element of shock liver which was prolonging aPTT. Discussed with Dr. Ander Slade and he is OK with continuing heparin in the setting of liver dysfunction.     Goal of Therapy:  Heparin level 0.3-0.7 units/ml, aPTT 66-102 sec Monitor platelets by anticoagulation protocol: Yes   Plan:   - Increase heparin to 650 units/h  - Check aPTT in 8 hours - Daily aPTT and CBC - Monitor for signs and symptoms of bleeding   Thank you for allowing pharmacy to be a part of this patient's care.  Adria Dill, PharmD PGY-1 Acute Care Resident  10/07/2020 10:26 AM

## 2020-10-07 NOTE — Progress Notes (Signed)
NAME:  Michelle [name], MRN:  [id number], DOB:  16-Jun-1930, LOS: 8 ADMISSION DATE:  10/10/2020, CONSULTATION DATE:  10/01/2020 REFERRING MD:  Dr. Sloan Leiter, CHIEF COMPLAINT:  Respiratory Distress    History of Present Illness:  87 yr old female presented to ER with chest pain and dyspnea.  Found to have small PE and started on heparin gtt.  Urine culture positive for Pseudomonas.  Developed worsening hypoxia and tachycardia from HCAP and transferred to ICU.  She had admission in July 2022 for sepsis from Aerococcus bacteremia, and acute ITP.  Pertinent  Medical History  HTN, Breast CA on ibrance, GERD, CVA  Significant Hospital Events: Including procedures, antibiotic start and stop dates in addition to other pertinent events   8/16 admitted to ED 8/18 transferred to ICU  8/21 Made DNR after family discussion 8/23 Hgb 6.3, transfuse with 2 units of PRBC 8/24 was having air hunger,  Interim History / Subjective:  8/23 events: Hgb 6.3, Transfused  with 2 units PRBC On BiPAP, unresponsive Minimal sedation for BiPAP tolerance  Objective   Blood pressure (!) 103/40, pulse (!) 101, temperature 98.2 F (36.8 C), temperature source Axillary, resp. rate 20, height '5\' 1"'$  (1.549 m), weight 55.8 kg, SpO2 99 %.    Vent Mode: PCV;BIPAP FiO2 (%):  [60 %-100 %] 70 % Set Rate:  [10 bmp] 10 bmp PEEP:  [6 cmH20-8 cmH20] 8 cmH20   Intake/Output Summary (Last 24 hours) at 10/07/2020 0747 Last data filed at 10/07/2020 0600 Gross per 24 hour  Intake 3757.05 ml  Output 400 ml  Net 3357.05 ml    Filed Weights   10/14/2020 0700 10/07/20 0615  Weight: 47.1 kg 55.8 kg   Examination: Appearance -chronically ill-appearing ENMT -BiPAP in place  neck -supple, no JVD Respiratory -decreased air movement, rhonchi, tachypneic CV -S1-S2 appreciated GI -soft, bowel sounds appreciated MSK -poor strength, poor tone Ext - no cyanosis, clubbing, or joint inflammation noted Skin - no rashes, lesions, or  ulcers Neuro -unresponsive  Resolved Hospital Problem list   Elevated troponin from demand ischemia 2nd to PE  Assessment & Plan:   Goals of care conversation -Does not desire to be on a ventilator -DNR status  Acute hypoxemic respiratory failure from HCAP Acute pulmonary embolism Pseudomonas pneumonia -On meropenem -On BiPAP -Unfortunately, unable to wean from BiPAP  HCAP Pseudomonal UTI -Switched to meropenem with concern for encephalopathy-day 2 meropenem -Was on cefepime for 5 days  History of breast cancer -Was on Arimidex -Ibrance on hold -Oncology following  Anemia critical illness -Posttransfusion, H&H stable  Acute kidney injury -Trend BMP -Avoid nephrotoxic's  ITP -On prednisone daily  Steroid-induced hyperglycemia -SSI  Goals of care. - Made DNR 10/04/20 - Family discussion on 10/05/20, per [place] discussion, we will continue with aggressive care at this time, but [name] continues to be DNR/DNI.  -We have updated family daily, no escalation of care but to continue BiPAP at present  Best Practice (right click and "Reselect all SmartList Selections" daily)  Diet/type: heart healthy DVT prophylaxis: Heparin GI prophylaxis: not indicated Code Status:  full code Last date of multidisciplinary goals of care discussion:    Labs    CMP Latest Ref Rng & Units 10/07/2020 10/06/2020 10/05/2020  Glucose 70 - 99 mg/dL 134(H) 178(H) 165(H)  BUN 8 - 23 mg/dL 55(H) 46(H) 41(H)  Creatinine 0.44 - 1.00 mg/dL 1.33(H) 1.32(H) 1.17(H)  Sodium 135 - 145 mmol/L 147(H) 142 143  Potassium 3.5 - 5.1 mmol/L 4.5 4.1  4.3  Chloride 98 - 111 mmol/L 117(H) 111 112(H)  CO2 22 - 32 mmol/L 17(L) 18(L) 20(L)  Calcium 8.9 - 10.3 mg/dL 8.4(L) 8.5(L) 8.4(L)  Total Protein 6.5 - 8.1 g/dL 5.5(L) - -  Total Bilirubin 0.3 - 1.2 mg/dL 1.7(H) - -  Alkaline Phos 38 - 126 U/L 117 - -  AST 15 - 41 U/L 1,025(H) - -  ALT 0 - 44 U/L 701(H) - -    CBC Latest Ref Rng & Units 10/07/2020  10/06/2020 10/06/2020  WBC 4.0 - 10.5 K/uL 23.4(H) - 23.8(H)  Hemoglobin 12.0 - 15.0 g/dL 8.5(L) 8.3(L) 6.3(LL)  Hematocrit 36.0 - 46.0 % 27.4(L) 25.8(L) 20.4(L)  Platelets 150 - 400 K/uL 268 - 325    ABG    Component Value Date/Time   PHART 7.503 (H) 10/02/2020 0307   PCO2ART 34.1 10/02/2020 0307   PO2ART 81 (L) 10/02/2020 0307   HCO3 26.9 10/02/2020 0307   TCO2 28 10/02/2020 0307   ACIDBASEDEF 3.0 (H) 10/01/2020 1520   O2SAT 97.0 10/02/2020 0307   CBG (last 3)  Recent Labs    10/06/20 1919 10/06/20 2126 10/07/20 0733  GLUCAP 118* 114* 128*    The patient is critically ill with multiple organ systems failure and requires high complexity decision making for assessment and support, frequent evaluation and titration of therapies, application of advanced monitoring technologies and extensive interpretation of multiple databases. Critical Care Time devoted to patient care services described in this note independent of APP/resident time (if applicable)  is 32 minutes.   Sherrilyn Rist MD [place] [facility] Personal pager: See Amion If unanswered, please page CCM On-call: [phone]

## 2020-10-08 DIAGNOSIS — J189 Pneumonia, unspecified organism: Secondary | ICD-10-CM | POA: Diagnosis not present

## 2020-10-08 DIAGNOSIS — Z7189 Other specified counseling: Secondary | ICD-10-CM | POA: Diagnosis not present

## 2020-10-08 DIAGNOSIS — Z66 Do not resuscitate: Secondary | ICD-10-CM | POA: Diagnosis not present

## 2020-10-08 DIAGNOSIS — Z87898 Personal history of other specified conditions: Secondary | ICD-10-CM

## 2020-10-08 DIAGNOSIS — Z515 Encounter for palliative care: Secondary | ICD-10-CM | POA: Diagnosis not present

## 2020-10-08 DIAGNOSIS — R300 Dysuria: Secondary | ICD-10-CM

## 2020-10-08 LAB — HEPARIN LEVEL (UNFRACTIONATED): Heparin Unfractionated: >1.1 IU/mL — ABNORMAL HIGH (ref 0.30–0.70)

## 2020-10-08 LAB — COMPREHENSIVE METABOLIC PANEL
ALT: 1101 U/L — ABNORMAL HIGH (ref 0–44)
AST: 1415 U/L — ABNORMAL HIGH (ref 15–41)
Albumin: 1.8 g/dL — ABNORMAL LOW (ref 3.5–5.0)
Alkaline Phosphatase: 115 U/L (ref 38–126)
Anion gap: 13 (ref 5–15)
BUN: 69 mg/dL — ABNORMAL HIGH (ref 8–23)
CO2: 16 mmol/L — ABNORMAL LOW (ref 22–32)
Calcium: 8 mg/dL — ABNORMAL LOW (ref 8.9–10.3)
Chloride: 116 mmol/L — ABNORMAL HIGH (ref 98–111)
Creatinine, Ser: 1.75 mg/dL — ABNORMAL HIGH (ref 0.44–1.00)
GFR, Estimated: 28 mL/min — ABNORMAL LOW (ref >60)
Glucose, Bld: 197 mg/dL — ABNORMAL HIGH (ref 70–99)
Potassium: 4.4 mmol/L (ref 3.5–5.1)
Sodium: 145 mmol/L (ref 135–145)
Total Bilirubin: 1.5 mg/dL — ABNORMAL HIGH (ref 0.3–1.2)
Total Protein: 4.6 g/dL — ABNORMAL LOW (ref 6.5–8.1)

## 2020-10-08 LAB — CBC
HCT: 24.4 % — ABNORMAL LOW (ref 36.0–46.0)
Hemoglobin: 7.4 g/dL — ABNORMAL LOW (ref 12.0–15.0)
MCH: 30.1 pg (ref 26.0–34.0)
MCHC: 30.3 g/dL (ref 30.0–36.0)
MCV: 99.2 fL (ref 80.0–100.0)
Platelets: 209 10*3/uL (ref 150–400)
RBC: 2.46 MIL/uL — ABNORMAL LOW (ref 3.87–5.11)
RDW: 17.8 % — ABNORMAL HIGH (ref 11.5–15.5)
WBC: 18.1 10*3/uL — ABNORMAL HIGH (ref 4.0–10.5)
nRBC: 29.9 % — ABNORMAL HIGH (ref 0.0–0.2)

## 2020-10-08 LAB — PROTIME-INR
INR: 2.5 — ABNORMAL HIGH (ref 0.8–1.2)
Prothrombin Time: 27.3 seconds — ABNORMAL HIGH (ref 11.4–15.2)

## 2020-10-08 LAB — APTT
aPTT: 83 seconds — ABNORMAL HIGH (ref 24–36)
aPTT: 86 seconds — ABNORMAL HIGH (ref 24–36)

## 2020-10-08 LAB — GLUCOSE, CAPILLARY
Glucose-Capillary: 120 mg/dL — ABNORMAL HIGH (ref 70–99)
Glucose-Capillary: 135 mg/dL — ABNORMAL HIGH (ref 70–99)
Glucose-Capillary: 143 mg/dL — ABNORMAL HIGH (ref 70–99)
Glucose-Capillary: 150 mg/dL — ABNORMAL HIGH (ref 70–99)
Glucose-Capillary: 174 mg/dL — ABNORMAL HIGH (ref 70–99)

## 2020-10-08 LAB — PATHOLOGIST SMEAR REVIEW

## 2020-10-08 MED ORDER — FUROSEMIDE 10 MG/ML IJ SOLN
40.0000 mg | Freq: Once | INTRAMUSCULAR | Status: AC
  Administered 2020-10-08: 40 mg via INTRAVENOUS
  Filled 2020-10-08: qty 4

## 2020-10-08 MED ORDER — INSULIN ASPART 100 UNIT/ML IJ SOLN
0.0000 [IU] | INTRAMUSCULAR | Status: DC
  Administered 2020-10-08: 1 [IU] via SUBCUTANEOUS
  Administered 2020-10-09: 2 [IU] via SUBCUTANEOUS
  Administered 2020-10-09: 1 [IU] via SUBCUTANEOUS
  Administered 2020-10-09 (×2): 3 [IU] via SUBCUTANEOUS
  Administered 2020-10-10: 7 [IU] via SUBCUTANEOUS

## 2020-10-08 NOTE — Progress Notes (Addendum)
ANTICOAGULATION CONSULT NOTE -Follow-Up  Pharmacy Consult for apixaban>>heparin Indication: pulmonary embolus  No Known Allergies  Patient Measurements: ~50 kg  Vital Signs: Temp: 95.9 F (35.5 C) (08/25 1130) Temp Source: Axillary (08/25 1130) BP: 104/40 (08/25 1100) Pulse Rate: 84 (08/25 1100)  Labs: Recent Labs    10/05/20 2222 10/05/20 2222 10/06/20 0410 10/06/20 1052 10/06/20 1321 10/06/20 2015 10/07/20 0220 10/07/20 1945 10/08/20 0436  HGB  --    < > 6.3*  --  8.3*  --  8.5*  --  7.4*  HCT  --    < > 20.4*  --  25.8*  --  27.4*  --  24.4*  PLT  --   --  325  --   --   --  268  --  209  APTT 118*  --   --    < >  --    < > 55* 64* 83*  LABPROT  --   --   --   --   --   --  26.1*  --  27.3*  INR  --   --   --   --   --   --  2.4*  --  2.5*  HEPARINUNFRC >1.10*  --   --   --   --   --  >1.10*  --   --   CREATININE 1.17*  --  1.32*  --   --   --  1.33*  --  1.75*   < > = values in this interval not displayed.     Estimated Creatinine Clearance: 16.8 mL/min (A) (by C-G formula based on SCr of 1.75 mg/dL (H)).   Medical History: Past Medical History:  Diagnosis Date   Arthritis    Cancer ([place])    Bilaterial Breast Cancer   Dyspnea    GERD (gastroesophageal reflux disease)    Hypertension    Stroke [facility])    "mini stroke" per patient     Assessment: 87 y/o F with history of breast cancer, found to have small PE 09/26/2020. No anticoagulation prior to admission. Pharmacy consulted for heparin then changed to eliquis from 8/17-8/21 AM.  Pt now NPO on BiPAP, pharmacy has been consulted for heparin dosing.    aPTT therapeutic 8/25 at 86 with heparin running at 650 U/h.Of note, heparin was ordered to be increased to 700 overnight 8/24-8/25, but the rate was never changed and aPTT came back therapeutic 8/25 AM '@83'$  and confirmatory aPTT continued to be therapeutic. Hgb has decreased from 8/24 from 8.5 (s/p 1U PRBCs) to 7.4 8/25. Nurse noted 8/23 AM there was some red  streaks in a formed stool and some minor oozing around one of her PIV's, but no signs of bleeding noted 8/25. PLT remain WNL, but trending down. Notably, patient had an elevated INR (3.0) from 8/22 which decreased to 2.5 8/25. LFTs came back elevated 8/24 and 8/25 and as aPTT has been highly variable with fairly stable heparin doses, there is high suspicion the patient had an element of shock liver which was prolonging aPTT (currently up-titrating NE to treat shock liver). Discussed with Dr. Ander Slade and he is OK with continuing heparin in the setting of liver dysfunction. Heparin level and aPTT still not correlating (HL >1.1), so wil continue to titrate based off aPTT.     Goal of Therapy:  Heparin level 0.3-0.7 units/ml, aPTT 66-102 sec Monitor platelets by anticoagulation protocol: Yes   Plan:   - CONTINUE heparin at  650 units/h  - Check aPTT and heparin level next tomorrow morning since therapeutic x2 - Daily aPTT, heparin level, and CBC - Monitor for signs and symptoms of bleeding   Thank you for allowing pharmacy to be a part of this patient's care.  Adria Dill, PharmD PGY-1 Acute Care Resident  10/08/2020 11:42 AM

## 2020-10-08 NOTE — Progress Notes (Signed)
NAME:  Michelle [name], MRN:  [id number], DOB:  1930/03/13, LOS: 9 ADMISSION DATE:  10/08/2020, CONSULTATION DATE:  10/01/2020 REFERRING MD:  Dr. Sloan Leiter, CHIEF COMPLAINT:  Respiratory Distress    History of Present Illness:  87 yr old female presented to ER with chest pain and dyspnea.  Found to have small PE and started on heparin gtt.  Urine culture positive for Pseudomonas.  Developed worsening hypoxia and tachycardia from HCAP and transferred to ICU.  She had admission in July 2022 for sepsis from Aerococcus bacteremia, and acute ITP.  Pertinent  Medical History  HTN, Breast CA on ibrance, GERD, CVA  Significant Hospital Events: Including procedures, antibiotic start and stop dates in addition to other pertinent events   8/16 admitted to ED 8/18 transferred to ICU  8/21 Made DNR after family discussion 8/23 Hgb 6.3, transfuse with 2 units of PRBC 8/24 was having air hunger  Interim History / Subjective:  8/23 events: Hgb 6.3, Transfused  with 2 units PRBC On BiPAP, interactive with a lot of coaxing Remains on minimal sedation for BiPAP tolerance Remains on small dose of pressors  Objective   Blood pressure (!) 97/36, pulse 84, temperature (!) 96 F (35.6 C), temperature source Axillary, resp. rate 17, height '5\' 1"'$  (1.549 m), weight 55.8 kg, SpO2 99 %.    Vent Mode: PCV;BIPAP FiO2 (%):  [60 %-80 %] 80 % Set Rate:  [10 bmp] 10 bmp PEEP:  [8 cmH20] 8 cmH20   Intake/Output Summary (Last 24 hours) at 10/08/2020 0859 Last data filed at 10/08/2020 0800 Gross per 24 hour  Intake 2802.39 ml  Output 100 ml  Net 2702.39 ml   Filed Weights   09/14/2020 0700 10/07/20 0615  Weight: 47.1 kg 55.8 kg   Examination: Appearance -chronically ill-appearing ENMT -BiPAP in place neck -no JVD, no thyromegaly, no adenopathy Respiratory -decreased air movement, CV -S1-S2 appreciated GI -bowel sounds appreciated MSK -poor strength, poor tone Ext -no cyanosis, no clubbing Skin - no rashes,  lesions, or ulcers Neuro -very minimally responsive  Resolved Hospital Problem list   Elevated troponin from demand ischemia 2nd to PE  Assessment & Plan:   Ongoing goals of care conversation -She is DNR status, does not desire to be on the ventilator -Appreciate palliative care involvement -For family meeting at about 3:00 today 825  Acute hypoxemic respiratory failure from HCAP Acute pulmonary embolism Pseudomonas pneumonia -On meropenem -Unable to wean from BiPAP -Continue current support  HCAP, pseudomonal UTI -Slight improvement in leukocytosis  Elevated liver enzymes -Likely related to hypoperfusion -Will maintain pressors  Acute kidney injury -Maintain renal perfusion -She is 7 L positive -Hold current LR -We will give 1 dose of Lasix 40x1  History of breast cancer -Ibrance on hold -Was on Arimidex  History of ITP -On prednisone daily  Steroid-induced hyperglycemia -SSI  Goals of care. - Made DNR 10/04/20 - Family discussion on 10/05/20, per [place] discussion, we will continue with aggressive care at this time, but Michelle [name] continues to be DNR/DNI.  -No escalation of care but continue BiPAP at present -Family meeting today at [place] (right click and "Reselect all SmartList Selections" daily)  Diet/type: Has not been able to eat as she has been on BiPAP DVT prophylaxis: Heparin GI prophylaxis: not indicated Code Status:  full code Last date of multidisciplinary goals of care discussion:    Labs    CMP Latest Ref Rng & Units 10/08/2020 10/07/2020 10/06/2020  Glucose 70 -  99 mg/dL 197(H) 134(H) 178(H)  BUN 8 - 23 mg/dL 69(H) 55(H) 46(H)  Creatinine 0.44 - 1.00 mg/dL 1.75(H) 1.33(H) 1.32(H)  Sodium 135 - 145 mmol/L 145 147(H) 142  Potassium 3.5 - 5.1 mmol/L 4.4 4.5 4.1  Chloride 98 - 111 mmol/L 116(H) 117(H) 111  CO2 22 - 32 mmol/L 16(L) 17(L) 18(L)  Calcium 8.9 - 10.3 mg/dL 8.0(L) 8.4(L) 8.5(L)  Total Protein 6.5 - 8.1 g/dL 4.6(L) 5.5(L) -   Total Bilirubin 0.3 - 1.2 mg/dL 1.5(H) 1.7(H) -  Alkaline Phos 38 - 126 U/L 115 117 -  AST 15 - 41 U/L 1,415(H) 1,025(H) -  ALT 0 - 44 U/L 1,101(H) 701(H) -    CBC Latest Ref Rng & Units 10/08/2020 10/07/2020 10/06/2020  WBC 4.0 - 10.5 K/uL 18.1(H) 23.4(H) -  Hemoglobin 12.0 - 15.0 g/dL 7.4(L) 8.5(L) 8.3(L)  Hematocrit 36.0 - 46.0 % 24.4(L) 27.4(L) 25.8(L)  Platelets 150 - 400 K/uL 209 268 -    ABG    Component Value Date/Time   PHART 7.503 (H) 10/02/2020 0307   PCO2ART 34.1 10/02/2020 0307   PO2ART 81 (L) 10/02/2020 0307   HCO3 26.9 10/02/2020 0307   TCO2 28 10/02/2020 0307   ACIDBASEDEF 3.0 (H) 10/01/2020 1520   O2SAT 97.0 10/02/2020 0307   CBG (last 3)  Recent Labs    10/07/20 1950 10/07/20 2216 10/08/20 0711  GLUCAP 137* 127* 174*   The patient is critically ill with multiple organ systems failure and requires high complexity decision making for assessment and support, frequent evaluation and titration of therapies, application of advanced monitoring technologies and extensive interpretation of multiple databases. Critical Care Time devoted to patient care services described in this note independent of APP/resident time (if applicable)  is 30 minutes.   Sherrilyn Rist MD [place] [facility] Personal pager: See Amion If unanswered, please page CCM On-call: [phone]

## 2020-10-08 NOTE — Progress Notes (Signed)
Palliative:  HPI/Patient Profile: 87 y.o. female  with past medical history of hypertension, breast cancer on ibrance, and GERD admitted on 10/01/2020 with chest pain and dyspnea. Found to have small PE and started on heparin gtt.  Urine culture positive for Pseudomonas.  Developed worsening hypoxia and tachycardia from HCAP and transferred to ICU. Was made DNR 8/21 after CCM discussion with family. Required bipap for several days - small doses of sedation needed for bipap tolerance. Remains unable to wean from bipap. PMT consulted to discuss [place].  Discussion: Arrived at bedside for scheduled family meeting. Son, Michelle [name], is at bedside alone. Tells me patient's daughter was at bedside however she recently left as she was angry with hospital staff. Michelle [name] shares patient's daughter was intoxicated and is really not able to participate in decision making. He further shares that family had additional discussions about patient's care last night and patient's son Michelle [name] continues to request full scope care. He also shares that Michelle [name] has convinced Michelle [name] that full scope care is most appropriate.  Patient's son Michelle [name] is out of town in [place] - unable to travel d/t recent knee surgery. Michelle [name] and I called Michelle [name] to have goals of care discussion over speaker phone. We reviewed the patient's condition - reviewed that despite optimal medical care the patient is not improving - remains dependent on bipap - unable to take in nourishment. Michelle [name] joined our conversation to share update on patient's condition. Michelle [name] asks multiple questions about her WBC count and chest xray. Michelle [name] shares he believes the patient is getting better as her WBC count is getting better. We discuss that we cannot only look at numbers - the numbers and imaging are just a small piece of the bigger picture. Clinically, Michelle [name] is not improving. I ask Michelle [name] to consider what Michelle [name] would want. Michelle [name] tells me he is not interested in any sort of deescalation  of care unless Michelle [name] shows signs that she is declining - to him this would mean unstable vital signs or worsening labs. We discuss that Michelle [name] is receiving a lot of support and oxygen through the bipap and medications that may be able to prolong her life for some time but these methods will not fix her - we discuss that she will continue to get weaker as she is not able to move, not able to take in nourishment. We discuss concern about her quality of life and inability to return to baseline following significant illness. Michelle [name] is agreeable to continued conversations but requests that we continue full scope care.  Following conversation with Michelle [name] and I spoke privately. He shares his frustration with situation. He shares his concern that his mother is suffering. He also shares that he would like the whole family to come to a consensus together as to how to move forward with Michelle [name]'s care.  We discuss ongoing goals of care discussions in coming days. Michelle [name] agrees.  Exam: No response to voice or light touch. Remains on bipap - 80% FiO2. Remains on pressors. No distress noted. Frail.  Plan: Maintain DNR per patient's previously stated wishes, continue current measures with bipap,pressors. PMT will continue goals of care discussions with family. Appreciate help of Michelle [name] with goals of care discussions.   Michelle Burrow, DNP, AGNP-C Palliative Medicine Team Team Phone # [phone]  Pager # [phone]  Greater than 50%  of this time was spent counseling and coordinating care related to the above assessment and plan.  [place]  minutes

## 2020-10-08 NOTE — Progress Notes (Addendum)
Michelle [name]   DOB:[birth year]   BT#:[id number]   TXM#:[id number]  Subjective: Chart reviewed and events over the past few days have been noted.  Plan noted for family meeting today at 3 PM.  No family at the bedside at the time of my visit.  Michelle [name] was resting quietly.  I did not try to awaken her.  Objective:  Vitals:   10/08/20 1300 10/08/20 1340  BP: (!) 157/45   Pulse: (!) 101 96  Resp: (!) 26 17  Temp:  (!) 97.4 F (36.3 C)  SpO2: 100% 100%    Body mass index is 23.24 kg/m.  Intake/Output Summary (Last 24 hours) at 10/08/2020 1403 Last data filed at 10/08/2020 1300 Gross per 24 hour  Intake 2142.13 ml  Output 100 ml  Net 2042.13 ml    Chronically ill appearing BiPAP in place  No cervical or supraclavicular adenopathy  Lungs diminished  Heart regular rate and rhythm  Abdomen soft, +BS  Neuro minimally responsive    CBG (last 3)  Recent Labs    10/07/20 2216 10/08/20 0711 10/08/20 1125  GLUCAP 127* 174* 150*     Labs:  Lab Results  Component Value Date   WBC 18.1 (H) 10/08/2020   HGB 7.4 (L) 10/08/2020   HCT 24.4 (L) 10/08/2020   MCV 99.2 10/08/2020   PLT 209 10/08/2020   NEUTROABS 15.7 (H) 10/03/2020    '@LASTCHEMISTRY' @  Urine Studies No results for input(s): UHGB, CRYS in the last 72 hours.  Invalid input(s): UACOL, UAPR, USPG, UPH, UTP, UGL, UKET, UBIL, UNIT, UROB, ULEU, UEPI, UWBC, URBC, UBAC, CAST, [place], [place]  Basic Metabolic Panel: Recent Labs  Lab 10/02/20 0546 10/03/20 0025 10/04/20 0008 10/04/20 0344 10/05/20 0128 10/05/20 2222 10/06/20 0410 10/07/20 0220 10/08/20 0436  NA 135   < > 139   < > 140 143 142 147* 145  K 4.0   < > 4.2   < > 3.8 4.3 4.1 4.5 4.4  CL 106   < > 109   < > 108 112* 111 117* 116*  CO2 23   < > 22   < > 20* 20* 18* 17* 16*  GLUCOSE 128*   < > 163*   < > 157* 165* 178* 134* 197*  BUN 20   < > 24*   < > 30* 41* 46* 55* 69*  CREATININE 0.85   < > 0.93   < > 1.00 1.17* 1.32* 1.33* 1.75*  CALCIUM 8.1*   < > 8.7*   <  > 8.8* 8.4* 8.5* 8.4* 8.0*  MG 1.9  --  2.0  --   --  2.1  --   --   --   PHOS 2.5  --   --   --   --   --   --   --   --    < > = values in this interval not displayed.   GFR Estimated Creatinine Clearance: 16.8 mL/min (A) (by C-G formula based on SCr of 1.75 mg/dL (H)). Liver Function Tests: Recent Labs  Lab 10/07/20 0220 10/08/20 0436  AST 1,025* 1,415*  ALT 701* 1,101*  ALKPHOS 117 115  BILITOT 1.7* 1.5*  PROT 5.5* 4.6*  ALBUMIN 2.1* 1.8*   No results for input(s): LIPASE, AMYLASE in the last 168 hours. No results for input(s): AMMONIA in the last 168 hours. Coagulation profile Recent Labs  Lab 10/05/20 0128 10/07/20 0220 10/08/20 0436  INR 3.0* 2.4* 2.5*    CBC:  Recent Labs  Lab 10/01/20 1540 10/02/20 0249 10/02/20 0546 10/02/20 1420 10/03/20 0025 10/04/20 0344 10/05/20 0128 10/06/20 0410 10/06/20 1321 10/07/20 0220 10/08/20 0436  WBC 19.5*  --  15.2*   < > 18.2* 20.6* 24.3* 23.8*  --  23.4* 18.1*  NEUTROABS 17.5*  --  12.1*  --  15.7*  --   --   --   --   --   --   HGB 9.1*   < > 7.4*   < > 8.2* 7.4* 7.1* 6.3* 8.3* 8.5* 7.4*  HCT 28.0*   < > 22.7*   < > 25.2* 23.0* 23.6* 20.4* 25.8* 27.4* 24.4*  MCV 93.6  --  93.8   < > 95.1 95.8 100.9* 99.0  --  97.9 99.2  PLT 260  --  216   < > 276 288 308 325  --  268 209   < > = values in this interval not displayed.   Cardiac Enzymes: No results for input(s): CKTOTAL, CKMB, CKMBINDEX, TROPONINI in the last 168 hours. BNP: Invalid input(s): POCBNP CBG: Recent Labs  Lab 10/07/20 1620 10/07/20 1950 10/07/20 2216 10/08/20 0711 10/08/20 1125  GLUCAP 142* 137* 127* 174* 150*   D-Dimer No results for input(s): DDIMER in the last 72 hours. Hgb A1c No results for input(s): HGBA1C in the last 72 hours.  Lipid Profile Recent Labs    10/06/20 0410  CHOL 165  HDL 40*  LDLCALC 104*  TRIG 104  CHOLHDL 4.1   Thyroid function studies No results for input(s): TSH, T4TOTAL, T3FREE, THYROIDAB in the last 72  hours.  Invalid input(s): FREET3 Anemia work up No results for input(s): VITAMINB12, FOLATE, FERRITIN, TIBC, IRON, RETICCTPCT in the last 72 hours. Microbiology Recent Results (from the past 240 hour(s))  Blood culture (routine x 2)     Status: None   Collection Time: 09/17/2020  3:24 AM   Specimen: BLOOD  Result Value Ref Range Status   Specimen Description BLOOD RIGHT ANTECUBITAL  Final   Special Requests   Final    BOTTLES DRAWN AEROBIC AND ANAEROBIC Blood Culture adequate volume   Culture   Final    NO GROWTH 5 DAYS Performed at [facility], [address] [place], [place] [address]    Report Status 10/04/2020 FINAL  Final  Resp Panel by RT-PCR (Flu A&B, Covid) Nasopharyngeal Swab     Status: None   Collection Time: 10/01/2020  3:29 AM   Specimen: Nasopharyngeal Swab; Nasopharyngeal(NP) swabs in vial transport medium  Result Value Ref Range Status   SARS Coronavirus 2 by RT PCR NEGATIVE NEGATIVE Final    Comment: (NOTE) SARS-CoV-2 target nucleic acids are NOT DETECTED.  The SARS-CoV-2 RNA is generally detectable in upper respiratory specimens during the acute phase of infection. The lowest concentration of SARS-CoV-2 viral copies this assay can detect is 138 copies/mL. A negative result does not preclude SARS-Cov-2 infection and should not be used as the sole basis for treatment or other patient management decisions. A negative result may occur with  improper specimen collection/handling, submission of specimen other than nasopharyngeal swab, presence of viral mutation(s) within the areas targeted by this assay, and inadequate number of viral copies(<138 copies/mL). A negative result must be combined with clinical observations, patient history, and epidemiological information. The expected result is Negative.  Fact Sheet for Patients:  EntrepreneurPulse.com.au  Fact Sheet for Healthcare Providers:  IncredibleEmployment.be  This  test is no t yet approved or cleared by the  [place] Architectural technologist and  has been authorized for detection and/or diagnosis of SARS-CoV-2 by FDA under an Print production planner (EUA). This EUA will remain  in effect (meaning this test can be used) for the duration of the COVID-19 declaration under Section 564(b)(1) of the Act, 21 U.S.C.section 360bbb-3(b)(1), unless the authorization is terminated  or revoked sooner.       Influenza A by PCR NEGATIVE NEGATIVE Final   Influenza B by PCR NEGATIVE NEGATIVE Final    Comment: (NOTE) The Xpert Xpress SARS-CoV-2/FLU/RSV plus assay is intended as an aid in the diagnosis of influenza from Nasopharyngeal swab specimens and should not be used as a sole basis for treatment. Nasal washings and aspirates are unacceptable for Xpert Xpress SARS-CoV-2/FLU/RSV testing.  Fact Sheet for Patients: EntrepreneurPulse.com.au  Fact Sheet for Healthcare Providers: IncredibleEmployment.be  This test is not yet approved or cleared by the [place] FDA and has been authorized for detection and/or diagnosis of SARS-CoV-2 by FDA under an Emergency Use Authorization (EUA). This EUA will remain in effect (meaning this test can be used) for the duration of the COVID-19 declaration under Section 564(b)(1) of the Act, 21 U.S.C. section 360bbb-3(b)(1), unless the authorization is terminated or revoked.  Performed at [facility], [place] [address] [place] [address], [place], [place] [address]   Blood culture (routine x 2)     Status: None   Collection Time: 10/06/2020  3:40 AM   Specimen: BLOOD RIGHT FOREARM  Result Value Ref Range Status   Specimen Description BLOOD RIGHT FOREARM  Final   Special Requests   Final    BOTTLES DRAWN AEROBIC AND ANAEROBIC Blood Culture adequate volume   Culture   Final    NO GROWTH 5 DAYS Performed at [facility], [place] [address] [place], [place] [address]    Report Status 10/04/2020 FINAL  Final   Urine Culture     Status: Abnormal   Collection Time: 09/20/2020  5:16 AM   Specimen: Urine, Clean Catch  Result Value Ref Range Status   Specimen Description URINE, CLEAN CATCH  Final   Special Requests   Final    NONE Performed at [facility], [place] [address] [place], [place] [address]    Culture 50,000 COLONIES/mL PSEUDOMONAS AERUGINOSA (A)  Final   Report Status 10/01/2020 FINAL  Final   Organism ID, Bacteria PSEUDOMONAS AERUGINOSA (A)  Final      Susceptibility   Pseudomonas aeruginosa - MIC*    CEFTAZIDIME 4 SENSITIVE Sensitive     CIPROFLOXACIN <=0.25 SENSITIVE Sensitive     GENTAMICIN <=1 SENSITIVE Sensitive     IMIPENEM 2 SENSITIVE Sensitive     PIP/TAZO 8 SENSITIVE Sensitive     CEFEPIME 2 SENSITIVE Sensitive     * 50,000 COLONIES/mL PSEUDOMONAS AERUGINOSA  MRSA Next Gen by PCR, Nasal     Status: None   Collection Time: 10/02/20  8:05 AM   Specimen: Nasal Mucosa; Nasal Swab  Result Value Ref Range Status   MRSA by PCR Next Gen NOT DETECTED NOT DETECTED Final    Comment: (NOTE) The GeneXpert MRSA Assay (FDA approved for NASAL specimens only), is one component of a comprehensive MRSA colonization surveillance program. It is not intended to diagnose MRSA infection nor to guide or monitor treatment for MRSA infections. Test performance is not FDA approved in patients less than 64 years old. Performed at [facility], [place] [address], [place], [place] [address]  Studies:  DG Chest Port 1 View  Result Date: 10/07/2020 CLINICAL DATA:  PICC placement EXAM: PORTABLE CHEST 1 VIEW COMPARISON:  10/07/2020 FINDINGS: Left PICC line is in place with the tip at the cavoatrial junction. Extensive bilateral lung opacities. Heart is borderline in size. Small left pleural effusion. Findings stable since prior study. IMPRESSION: Left PICC line tip at the cavoatrial junction. Otherwise no change. Electronically Signed   By: Rolm Baptise M.D.   On: 10/07/2020 17:30    DG CHEST PORT 1 VIEW  Result Date: 10/07/2020 CLINICAL DATA:  Respiratory failure. EXAM: PORTABLE CHEST 1 VIEW COMPARISON:  October 05, 2020. FINDINGS: Stable cardiomediastinal silhouette. Stable bilateral lung opacities are noted concerning for multifocal pneumonia or possibly edema. No pneumothorax is noted. Small bilateral pleural effusions are noted. Bony thorax is unremarkable. IMPRESSION: Stable bilateral lung opacities as described above with probable small bilateral pleural effusions. Electronically Signed   By: Marijo Conception M.D.   On: 10/07/2020 10:59   [name] EKG SITE RITE  Result Date: 10/07/2020 If Site Rite image not attached, placement could not be confirmed due to current cardiac rhythm.   Assessment: 87 y.o. [place] woman status post bilateral breast biopsies on 05/17/2019 as follows:             (a) on the left upper outer quadrant, a clinical T2 N2, stage IIIA invasive ductal carcinoma,grade 2 or 3, estrogen receptor positive, progesterone receptor negative, with no HER-2 amplification and an MIB-1 of 12%             (b) on the right overlapping sites, 2 morphologically distinct invasive ductal carcinomas, both clinically T1c N0, stage IA, grade 1 or 2, estrogen receptor positive, progesterone receptor negative, with no HER-2 amplification and an MIB-1 of 5%             (c) CT of the chest abdomen and pelvis 06/10/2019 shows left axillary, retropectoral and supraclavicular lymph nodes, and a 4.1 cm left breast mass; no right axillary adenopathy; 0.6 cm nonspecific left lower lobe pulmonary nodule; no liver or bone lesions.             (d) bone scan 06/26/2019--no evidence of skeletal metastases                          (1) neoadjuvant anastrozole started 05/28/2019, palbociclib added 05/30/2019 125 mg a day, 21 on 7 off             (a) palbociclib dose decreased cycle 2 (May 2021) to 100 mg daily, 21/7             (b) bilateral breast and axilla ultrasonography 10/04/2019 shows  a good initial response to treatment             (c) repeat bilateral ultrasonography 05/20/2020 continues to show evidence of response   (2) genetics testing declined   (3) definitive surgery to follow   (4) adjuvant radiation to follow   (5) immune thrombocytopenia (ITP)             (A) ADAMTS 13 was 63.0 on 09/09/2020             (B) status post IVIG 09/10/2020 and 09/11/2020             (C) prednisone 40 mg/ daily started 09/12/2018 stopped sometime the week of 09/21/2020  (D) prednisone 40 mg daily resumed 10/01/2020  (6) Pulmonary embolus noted on CTA on  09/15/2020  (A) Eliquis 61m BID began on 09/30/2020 and stopped on 10/04/2020  (B) Heparin infusion started 10/04/2020 due to NPO status  Plan:   [place] due to [place] and was initially doing well.  However, she developed worsening respiratory distress and hypoxia secondary to HCAP.  She is now on BiPAP and has continued to decline despite aggressive care.  Appreciate the excellent care she has received from the PCCM team and palliative care team.  Family meeting later today at 3 PM to discuss transitioning her to comfort measures.  From our standpoint, we would very much agree with transitioning her to comfort measures.  Agree with DNR/DNI.  KMikey Bussing NP 10/08/2020  2:03 PM KMikey Bussing NP Medical Oncology and Hematology [id number] W[address] Miles DriveAPark City [place] [id number]. [phone]   Fax. [phone]

## 2020-10-09 ENCOUNTER — Inpatient Hospital Stay (HOSPITAL_COMMUNITY): Payer: Medicare HMO

## 2020-10-09 DIAGNOSIS — Z7189 Other specified counseling: Secondary | ICD-10-CM | POA: Diagnosis not present

## 2020-10-09 DIAGNOSIS — J189 Pneumonia, unspecified organism: Secondary | ICD-10-CM | POA: Diagnosis not present

## 2020-10-09 DIAGNOSIS — R6510 Systemic inflammatory response syndrome (SIRS) of non-infectious origin without acute organ dysfunction: Secondary | ICD-10-CM | POA: Diagnosis not present

## 2020-10-09 DIAGNOSIS — I2699 Other pulmonary embolism without acute cor pulmonale: Secondary | ICD-10-CM | POA: Diagnosis not present

## 2020-10-09 DIAGNOSIS — N390 Urinary tract infection, site not specified: Secondary | ICD-10-CM | POA: Diagnosis not present

## 2020-10-09 LAB — GLUCOSE, CAPILLARY
Glucose-Capillary: 147 mg/dL — ABNORMAL HIGH (ref 70–99)
Glucose-Capillary: 178 mg/dL — ABNORMAL HIGH (ref 70–99)
Glucose-Capillary: 182 mg/dL — ABNORMAL HIGH (ref 70–99)
Glucose-Capillary: 155 mg/dL — ABNORMAL HIGH (ref 70–99)
Glucose-Capillary: 223 mg/dL — ABNORMAL HIGH (ref 70–99)
Glucose-Capillary: 240 mg/dL — ABNORMAL HIGH (ref 70–99)

## 2020-10-09 LAB — COMPREHENSIVE METABOLIC PANEL
ALT: 682 U/L — ABNORMAL HIGH (ref 0–44)
AST: 342 U/L — ABNORMAL HIGH (ref 15–41)
Albumin: 2 g/dL — ABNORMAL LOW (ref 3.5–5.0)
Alkaline Phosphatase: 116 U/L (ref 38–126)
Anion gap: 12 (ref 5–15)
BUN: 98 mg/dL — ABNORMAL HIGH (ref 8–23)
CO2: 18 mmol/L — ABNORMAL LOW (ref 22–32)
Calcium: 7.8 mg/dL — ABNORMAL LOW (ref 8.9–10.3)
Chloride: 120 mmol/L — ABNORMAL HIGH (ref 98–111)
Creatinine, Ser: 2.44 mg/dL — ABNORMAL HIGH (ref 0.44–1.00)
GFR, Estimated: 19 mL/min — ABNORMAL LOW (ref >60)
Glucose, Bld: 235 mg/dL — ABNORMAL HIGH (ref 70–99)
Potassium: 4.4 mmol/L (ref 3.5–5.1)
Sodium: 150 mmol/L — ABNORMAL HIGH (ref 135–145)
Total Bilirubin: 1.5 mg/dL — ABNORMAL HIGH (ref 0.3–1.2)
Total Protein: 4.7 g/dL — ABNORMAL LOW (ref 6.5–8.1)

## 2020-10-09 LAB — CBC
HCT: 26.7 % — ABNORMAL LOW (ref 36.0–46.0)
Hemoglobin: 8.5 g/dL — ABNORMAL LOW (ref 12.0–15.0)
MCH: 31.8 pg (ref 26.0–34.0)
MCHC: 31.8 g/dL (ref 30.0–36.0)
MCV: 100 fL (ref 80.0–100.0)
Platelets: 172 10*3/uL (ref 150–400)
RBC: 2.67 MIL/uL — ABNORMAL LOW (ref 3.87–5.11)
RDW: 18 % — ABNORMAL HIGH (ref 11.5–15.5)
WBC: 21.5 10*3/uL — ABNORMAL HIGH (ref 4.0–10.5)
nRBC: 38.9 % — ABNORMAL HIGH (ref 0.0–0.2)

## 2020-10-09 LAB — APTT: aPTT: 84 seconds — ABNORMAL HIGH (ref 24–36)

## 2020-10-09 LAB — PHOSPHORUS: Phosphorus: 5.6 mg/dL — ABNORMAL HIGH (ref 2.5–4.6)

## 2020-10-09 LAB — HEPARIN LEVEL (UNFRACTIONATED): Heparin Unfractionated: >1.1 IU/mL — ABNORMAL HIGH (ref 0.30–0.70)

## 2020-10-09 LAB — MAGNESIUM: Magnesium: 2.5 mg/dL — ABNORMAL HIGH (ref 1.7–2.4)

## 2020-10-09 MED ORDER — NOREPINEPHRINE 4 MG/250ML-% IV SOLN
0.0000 ug/min | INTRAVENOUS | Status: DC
Start: 2020-10-09 — End: 2020-10-10
  Administered 2020-10-09: 3 ug/min via INTRAVENOUS
  Filled 2020-10-09: qty 250

## 2020-10-09 MED ORDER — OSMOLITE 1.2 CAL PO LIQD
1000.0000 mL | ORAL | Status: DC
  Administered 2020-10-09: 1000 mL
  Filled 2020-10-09 (×4): qty 1000

## 2020-10-09 MED ORDER — PANTOPRAZOLE SODIUM 40 MG IV SOLR
40.0000 mg | INTRAVENOUS | Status: DC
  Administered 2020-10-09 – 2020-10-10 (×2): 40 mg via INTRAVENOUS
  Filled 2020-10-09 (×2): qty 40

## 2020-10-09 MED ORDER — FUROSEMIDE 10 MG/ML IJ SOLN
40.0000 mg | Freq: Once | INTRAMUSCULAR | Status: AC
  Administered 2020-10-09: 40 mg via INTRAVENOUS
  Filled 2020-10-09: qty 4

## 2020-10-09 NOTE — Progress Notes (Signed)
Elevated lactate on 10/01/2020 at 2.2, improved to 1.5 on repeat  Patient being treated for healthcare associated pneumonia, urinary tract infection secondary to Pseudomonas  Clinically undetermined if this was related to sepsis

## 2020-10-09 NOTE — Progress Notes (Signed)
ANTICOAGULATION CONSULT NOTE -Follow-Up  Pharmacy Consult for heparin Indication: pulmonary embolus  No Known Allergies  Patient Measurements: ~50 kg  Vital Signs: Temp: 99.3 F (37.4 C) (08/26 0816) Temp Source: Axillary (08/26 0816) BP: 148/35 (08/26 1400) Pulse Rate: 106 (08/26 1400)  Labs: Recent Labs    10/07/20 0220 10/07/20 1945 10/08/20 0436 10/08/20 1335 10/08/20 1336 10/09/20 0416 10/09/20 0418  HGB 8.5*  --  7.4*  --   --   --  8.5*  HCT 27.4*  --  24.4*  --   --   --  26.7*  PLT 268  --  209  --   --   --  172  APTT 55*   < > 83* 86*  --   --  84*  LABPROT 26.1*  --  27.3*  --   --   --   --   INR 2.4*  --  2.5*  --   --   --   --   HEPARINUNFRC >1.10*  --   --   --  >1.10* >1.10*  --   CREATININE 1.33*  --  1.75*  --   --   --   --    < > = values in this interval not displayed.     Estimated Creatinine Clearance: 18.2 mL/min (A) (by C-G formula based on SCr of 1.75 mg/dL (H)).   Medical History: Past Medical History:  Diagnosis Date   Arthritis    Cancer ([place])    Bilaterial Breast Cancer   Dyspnea    GERD (gastroesophageal reflux disease)    Hypertension    Stroke [facility])    "mini stroke" per patient     Assessment: 87 y/o F with history of breast cancer, found to have small PE 09/18/2020. No anticoagulation prior to admission. Pharmacy consulted for heparin then changed to eliquis from 8/17-8/21 AM.  Pt now NPO on BiPAP, pharmacy has been consulted for heparin dosing.    aPTT therapeutic 8/26 at 84 with heparin running at 650 U/h. Hgb increased from 7.4>8.5 8/26  PLT remain WNL, but trending down. Notably, patient had an elevated INR (3.0) from 8/22 which decreased to 2.5 8/25. LFTs came back elevated 8/24 and 8/25 and as aPTT has been highly variable with fairly stable heparin doses, there is high suspicion the patient had an element of shock liver which was prolonging aPTT (currently up-titrating NE to treat shock liver). Recent aPTT levels have  been stable in the 80s. Heparin level and aPTT still not correlating (HL >1.1), so wil continue to titrate based off aPTT. No signs of bleeding or issues with infusion noted per RN.     Goal of Therapy:  Heparin level 0.3-0.7 units/ml, aPTT 66-102 sec Monitor platelets by anticoagulation protocol: Yes   Plan:   - CONTINUE heparin at 650 units/h  - Check aPTT and heparin level next tomorrow morning since therapeutic x3 - Daily aPTT, heparin level, and CBC - Monitor for signs and symptoms of bleeding   Thank you for allowing pharmacy to be a part of this patient's care.  Adria Dill, PharmD PGY-1 Acute Care Resident  10/09/2020 3:01 PM

## 2020-10-09 NOTE — Progress Notes (Addendum)
Initial Nutrition Assessment  DOCUMENTATION CODES:   Not applicable  INTERVENTION:   Initiate tube feeding via Cortrak: Osmolite 1.2 at 25 ml/h, increase by 10 ml every 12 hours to goal rate of 55 ml/h (1320 ml per day)  Provides 1584 kcal, 73 gm protein, 1082 ml free water daily.  Monitor magnesium, potassium, and phosphorus BID for at least 3 days, MD to replete as needed, as pt is at risk for refeeding syndrome given minimal/no intake x 10 days.  Recommend holding TF if MAP is consistently below 60.  D/C Ensure PO.   NUTRITION DIAGNOSIS:   Inadequate oral intake related to inability to eat as evidenced by NPO status.  GOAL:   Patient will meet greater than or equal to 90% of their needs  MONITOR:   TF tolerance, Labs  REASON FOR ASSESSMENT:   Rounds, New TF    ASSESSMENT:   87 yo female admitted with small PE, Pseudomonas UTI, HCAP. PMH includes breast cancer on Ibrance, HTN, GERD, CVA.  Patient was on a heart healthy diet from 8/16 to 8/22, when she was made NPO for worsening respiratory distress. Meal intakes were poor when she was on a PO diet (0-5%). She is currently NPO and continues to require BiPAP.   Palliative care team is following. Goals of care are being discussed with family. Current plan is to continue current measures with BiPAP and pressors, but maintain DNR status per patient's previously stated wishes.   MAP (cuff) range 57-66 since 8am. Recommend hold TF if MAP is consistently < 60. On Levophed for pressure support.  Cortrak tube placed today, tip in the mid to distal stomach per x-ray. RD to order TF. Will start at a low rate and advance slowly due to refeeding risk. Will also need to monitor phos, mag, and K levels.   Labs reviewed.  CBG: [phone]  Medications reviewed and include Novolog SSI, Solumedrol, Remeron, Protonix, Senokot-S, Precedex, Levophed.  Usual weights reviewed. Patient with 7% weight loss from 04/02/20 (50.8 kg) to  09/08/20 (47.1 kg). Current weight up to 57.8 kg. Weight increase likely related to positive fluid status.   NUTRITION - FOCUSED PHYSICAL EXAM:  Unable to complete  Diet Order:   Diet Order             Diet NPO time specified  Diet effective now                   EDUCATION NEEDS:   No education needs have been identified at this time  Skin:  Skin Assessment: Reviewed RN Assessment  Last BM:  8/23 type 3  Height:   Ht Readings from Last 1 Encounters:  10/08/2020 '5\' 1"'$  (1.549 m)    Weight:   Wt Readings from Last 1 Encounters:  10/09/20 57.8 kg    BMI:  Body mass index is 24.08 kg/m.  Estimated Nutritional Needs:   Kcal:  1450-1650  Protein:  70-85 gm  Fluid:  1.5-1.7 L    Lucas Mallow, RD, LDN, CNSC Please refer to Amion for contact information.

## 2020-10-09 NOTE — Progress Notes (Signed)
Palliative:  HPI/Patient Profile: 87 y.o. female  with past medical history of hypertension, breast cancer on ibrance, and GERD admitted on 09/16/2020 with chest pain and dyspnea. Found to have small PE and started on heparin gtt.  Urine culture positive for Pseudomonas.  Developed worsening hypoxia and tachycardia from HCAP and transferred to ICU. Was made DNR 8/21 after CCM discussion with family. Required bipap for several days - small doses of sedation needed for bipap tolerance. Remains unable to wean from bipap. PMT consulted to discuss [place].  Discussion: Medical records reviewed. Discussed with RN Michelle [name] and assessed patient at the bedside. No family present during my evaluation.  I called patient's son Michelle [name] to provide support and follow up on yesterday's family meeting with my colleague Michelle Rhodes NP. Michelle [name] shares that the meeting went well and he remains in agreement with the current plan to continue interventions. He is still concerned that his mother is not doing as well as his brother Michelle [name] perceives, but he hopes that he is wrong and Michelle [name] is right. At [facility] request, we discussed patient's nutrition as well as her sedation level in anticipation of his sister-in-law visiting from [place]. this evening. We discussed the process of denial, acceptance, and how this varies from person to person. Michelle [name] last saw his mother in person a month ago after her discharge from [place]. While Michelle [name] agrees she was doing better at that time, he also understands this situation is now very different.  I shared my worry that his mother is not improving and emphasizes the importance of continued conversation amongst the family. She is at high risk to decompensate further despite interventions such as BiPAP, pressors, and nutrition. Michelle [name] verbalizes his understanding and agrees to continue discussions. He is taking things day by day and will continue involving his siblings via speaker phone to ensure open  communication.   Exam: Non-responsive. Remains on bipap - 80% FiO2, and tachypneic. Normal heart rate. Remains on pressors. No distress noted. Frail.  Assessment: Acute hypoxic respiratory failure, goals of care conversation  Plan:  -Continue full scope treatment; family would like patient to receive artificial nutrition  -Psychosocial and emotional support provided -Ongoing GOC discussions  Total time: 15 minutes Greater than 50% of this time was spent in counseling and coordinating care related to the above assessment and plan.  Michelle Cooler, PA-C Palliative Medicine Team Team phone # [phone]  Thank you for allowing the Palliative Medicine Team to assist in the care of this patient. Please utilize secure chat with additional questions, if there is no response within 30 minutes please call the above phone number.  Palliative Medicine Team providers are available by phone from 7am to 7pm daily and can be reached through the team cell phone.  Should this patient require assistance outside of these hours, please call the patient's attending physician.

## 2020-10-09 NOTE — Progress Notes (Signed)
NAME:  Michelle [name], MRN:  [id number], DOB:  May 16, 1930, LOS: 38 ADMISSION DATE:  09/18/2020, CONSULTATION DATE:  10/01/2020 REFERRING MD:  Dr. Sloan Leiter, CHIEF COMPLAINT:  Respiratory Distress    History of Present Illness:  87 yr old female presented to ER with chest pain and dyspnea.  Found to have small PE and started on heparin gtt.  Urine culture positive for Pseudomonas.  Developed worsening hypoxia and tachycardia from HCAP and transferred to ICU.  She had admission in July 2022 for sepsis from Aerococcus bacteremia, and acute ITP.  Pertinent  Medical History  HTN, Breast CA on ibrance, GERD, CVA  Significant Hospital Events: Including procedures, antibiotic start and stop dates in addition to other pertinent events   8/16 admitted to ED 8/18 transferred to ICU  8/21 Made DNR after family discussion 8/23 Hgb 6.3, transfuse with 2 units of PRBC 8/24 was having air hunger 8/25 On BiPAP with adequate saturation  Interim History / Subjective:   On BiPAP with adequate sats, precedex at 0.7 for BiPAP tolerance Remains on Levophed at 3 mcg/kg/hr with adequate BP Heparin at 650 Units/ hr WBC 21.5, HGB 8.5, Platelets 172, T Max 97.7 Will obtain chemistry, Not drawn this am Net + 11 L, 515 cc UO last 24 hours Last BNP 8/16>> 238.7  Objective   Blood pressure (!) 129/36, pulse 97, temperature 99.3 F (37.4 C), temperature source Axillary, resp. rate (!) 26, height '5\' 1"'$  (1.549 m), weight 57.8 kg, SpO2 100 %.    Vent Mode: PCV;BIPAP FiO2 (%):  [80 %] 80 % Set Rate:  [10 bmp] 10 bmp PEEP:  [8 cmH20] 8 cmH20   Intake/Output Summary (Last 24 hours) at 10/09/2020 0908 Last data filed at 10/09/2020 0600 Gross per 24 hour  Intake 804.26 ml  Output 515 ml  Net 289.26 ml   Filed Weights   10/10/2020 0700 10/07/20 0615 10/09/20 0600  Weight: 47.1 kg 55.8 kg 57.8 kg   Examination: Appearance -chronically ill-appearing, sedated elderly female on BiPAP, In no distress ENMT -BiPAP in  place neck -no JVD, no thyromegaly, no adenopathy Respiratory - Bilateral chest excursion, decreased air movement, few rhonchi CV -S1-S2 appreciated, No RMG GI -bowel sounds appreciated, abdomen soft and flat, NT, ND, Body mass index is 24.08 kg/m. MSK -poor strength, poor tone, physical deconditioning Ext -no cyanosis, no clubbing, 2+ edema upper extremity edema Skin - no rashes, lesions, or ulcers, Skin protective dressings to face under BiPAP mask Neuro -very minimally responsive, does not follow commands, does not move extremities  Resolved Hospital Problem list   Elevated troponin from demand ischemia 2nd to PE  Assessment & Plan:   Ongoing goals of care conversation -She is DNR status, does not desire to be on the ventilator -Appreciate palliative care involvement -Ongoing family meetings to determine  goals of care and appropriate plan of care  Acute hypoxemic respiratory failure from HCAP Acute pulmonary embolism Pseudomonas pneumonia -On meropenem -Unable to wean from BiPAP - Continue current support - Trend fever curve and WBC - Culture as is clinically indicated  HCAP, pseudomonal UTI -Slight increase in Leukocytosis - T max of 97/7  Elevated liver enzymes -Likely related to hypoperfusion -Will maintain pressors - Trend LFT's daily until down trending  Acute kidney injury -Maintain renal perfusion -She is 11 L positive -Minimize IVF as able - Lasix 40 mg x 1   History of breast cancer -Ibrance on hold -Was on Arimidex  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 - Made DNR 10/04/20 - Family discussion on 10/05/20, per [place] discussion, we will continue with aggressive care at this time, but [name] continues to be DNR/DNI.  -No escalation of care but continue BiPAP at present -Ongoing family meetings with Palliation   Best Practice (right click and  "Reselect all SmartList Selections" daily)  Diet/type: Has not been able to eat as she has been on BiPAP DVT prophylaxis: Heparin GI prophylaxis: not indicated Code Status:  full code Last date of multidisciplinary goals of care discussion:    Labs    CMP Latest Ref Rng & Units 10/08/2020 10/07/2020 10/06/2020  Glucose 70 - 99 mg/dL 197(H) 134(H) 178(H)  BUN 8 - 23 mg/dL 69(H) 55(H) 46(H)  Creatinine 0.44 - 1.00 mg/dL 1.75(H) 1.33(H) 1.32(H)  Sodium 135 - 145 mmol/L 145 147(H) 142  Potassium 3.5 - 5.1 mmol/L 4.4 4.5 4.1  Chloride 98 - 111 mmol/L 116(H) 117(H) 111  CO2 22 - 32 mmol/L 16(L) 17(L) 18(L)  Calcium 8.9 - 10.3 mg/dL 8.0(L) 8.4(L) 8.5(L)  Total Protein 6.5 - 8.1 g/dL 4.6(L) 5.5(L) -  Total Bilirubin 0.3 - 1.2 mg/dL 1.5(H) 1.7(H) -  Alkaline Phos 38 - 126 U/L 115 117 -  AST 15 - 41 U/L 1,415(H) 1,025(H) -  ALT 0 - 44 U/L 1,101(H) 701(H) -    CBC Latest Ref Rng & Units 10/09/2020 10/08/2020 10/07/2020  WBC 4.0 - 10.5 K/uL 21.5(H) 18.1(H) 23.4(H)  Hemoglobin 12.0 - 15.0 g/dL 8.5(L) 7.4(L) 8.5(L)  Hematocrit 36.0 - 46.0 % 26.7(L) 24.4(L) 27.4(L)  Platelets 150 - 400 K/uL 172 209 268    ABG    Component Value Date/Time   PHART 7.503 (H) 10/02/2020 0307   PCO2ART 34.1 10/02/2020 0307   PO2ART 81 (L) 10/02/2020 0307   HCO3 26.9 10/02/2020 0307   TCO2 28 10/02/2020 0307   ACIDBASEDEF 3.0 (H) 10/01/2020 1520   O2SAT 97.0 10/02/2020 0307   CBG (last 3)  Recent Labs    10/08/20 2340 10/09/20 0323 10/09/20 0815  GLUCAP 120* 147* 178*   The patient is critically ill with multiple organ systems failure and requires high complexity decision making for assessment and support, frequent evaluation and titration of therapies, application of advanced monitoring technologies and extensive interpretation of multiple databases. Critical Care Time devoted to patient care services described in this note independent of APP/resident time (if applicable)  is 30 minutes.   Magdalen Spatz,  MSN, AGACNP-BC [place] for personal pager PCCM on call pager [phone]  If unanswered, please page CCM On-call: [phone] 10/09/2020 10:54 AM

## 2020-10-09 NOTE — Procedures (Signed)
Cortrak  Person Inserting Tube:  Esaw Dace, RD Tube Type:  Cortrak - 43 inches Tube Size:  10 Tube Location:  Left nare Initial Placement:  Stomach Secured by: Bridle Technique Used to Measure Tube Placement:  Marking at nare/corner of mouth Cortrak Secured At:  63 cm  Cortrak Tube Team Note:  Consult received to place a Cortrak feeding tube.   X-ray is required, abdominal x-ray has been ordered by the Cortrak team. Please confirm tube placement before using the Cortrak tube.   If the tube becomes dislodged please keep the tube and contact the Cortrak team at [email or web address] ([email or web address]) for replacement.  If after hours and replacement cannot be delayed, place a NG tube and confirm placement with an abdominal x-ray.    Kerman Passey MS, RDN, LDN, CNSC Registered Dietitian III Clinical Nutrition RD Pager and On-Call Pager Number Located in [place]

## 2020-10-10 ENCOUNTER — Inpatient Hospital Stay (HOSPITAL_COMMUNITY): Payer: Medicare HMO

## 2020-10-10 DIAGNOSIS — J9601 Acute respiratory failure with hypoxia: Secondary | ICD-10-CM | POA: Diagnosis not present

## 2020-10-10 DIAGNOSIS — Z7189 Other specified counseling: Secondary | ICD-10-CM | POA: Diagnosis not present

## 2020-10-10 DIAGNOSIS — R918 Other nonspecific abnormal finding of lung field: Secondary | ICD-10-CM | POA: Diagnosis not present

## 2020-10-10 DIAGNOSIS — Z515 Encounter for palliative care: Secondary | ICD-10-CM | POA: Diagnosis not present

## 2020-10-10 DIAGNOSIS — I2693 Single subsegmental pulmonary embolism without acute cor pulmonale: Principal | ICD-10-CM

## 2020-10-10 DIAGNOSIS — J810 Acute pulmonary edema: Secondary | ICD-10-CM

## 2020-10-10 LAB — MAGNESIUM: Magnesium: 2.7 mg/dL — ABNORMAL HIGH (ref 1.7–2.4)

## 2020-10-10 LAB — GLUCOSE, CAPILLARY
Glucose-Capillary: 312 mg/dL — ABNORMAL HIGH (ref 70–99)
Glucose-Capillary: 281 mg/dL — ABNORMAL HIGH (ref 70–99)
Glucose-Capillary: 245 mg/dL — ABNORMAL HIGH (ref 70–99)
Glucose-Capillary: 274 mg/dL — ABNORMAL HIGH (ref 70–99)

## 2020-10-10 LAB — CBC WITH DIFFERENTIAL/PLATELET
Abs Immature Granulocytes: 0.99 10*3/uL — ABNORMAL HIGH (ref 0.00–0.07)
Basophils Absolute: 0.1 10*3/uL (ref 0.0–0.1)
Basophils Relative: 0 %
Eosinophils Absolute: 0 10*3/uL (ref 0.0–0.5)
Eosinophils Relative: 0 %
HCT: 27.1 % — ABNORMAL LOW (ref 36.0–46.0)
Hemoglobin: 8.1 g/dL — ABNORMAL LOW (ref 12.0–15.0)
Immature Granulocytes: 4 %
Lymphocytes Relative: 2 %
Lymphs Abs: 0.7 10*3/uL (ref 0.7–4.0)
MCH: 30.2 pg (ref 26.0–34.0)
MCHC: 29.9 g/dL — ABNORMAL LOW (ref 30.0–36.0)
MCV: 101.1 fL — ABNORMAL HIGH (ref 80.0–100.0)
Monocytes Absolute: 0.9 10*3/uL (ref 0.1–1.0)
Monocytes Relative: 3 %
Neutro Abs: 25.4 10*3/uL — ABNORMAL HIGH (ref 1.7–7.7)
Neutrophils Relative %: 91 %
Platelets: 150 10*3/uL (ref 150–400)
RBC: 2.68 MIL/uL — ABNORMAL LOW (ref 3.87–5.11)
RDW: 18.2 % — ABNORMAL HIGH (ref 11.5–15.5)
WBC: 28 10*3/uL — ABNORMAL HIGH (ref 4.0–10.5)
nRBC: 23.2 % — ABNORMAL HIGH (ref 0.0–0.2)

## 2020-10-10 LAB — TYPE AND SCREEN
ABO/RH(D): O POS
Antibody Screen: NEGATIVE
Unit division: 0
Unit division: 0

## 2020-10-10 LAB — HEPARIN LEVEL (UNFRACTIONATED): Heparin Unfractionated: >1.1 IU/mL — ABNORMAL HIGH (ref 0.30–0.70)

## 2020-10-10 LAB — BPAM RBC
Blood Product Expiration Date: 202209202359
Blood Product Expiration Date: 202209242359
ISSUE DATE / TIME: 202208230901
Unit Type and Rh: 5100
Unit Type and Rh: 5100

## 2020-10-10 LAB — BRAIN NATRIURETIC PEPTIDE
B Natriuretic Peptide: 2829.7 pg/mL — ABNORMAL HIGH (ref 0.0–100.0)
B Natriuretic Peptide: 3210.3 pg/mL — ABNORMAL HIGH (ref 0.0–100.0)

## 2020-10-10 LAB — COMPREHENSIVE METABOLIC PANEL
ALT: 628 U/L — ABNORMAL HIGH (ref 0–44)
AST: 260 U/L — ABNORMAL HIGH (ref 15–41)
Albumin: 2.2 g/dL — ABNORMAL LOW (ref 3.5–5.0)
Alkaline Phosphatase: 136 U/L — ABNORMAL HIGH (ref 38–126)
Anion gap: 10 (ref 5–15)
BUN: 111 mg/dL — ABNORMAL HIGH (ref 8–23)
CO2: 20 mmol/L — ABNORMAL LOW (ref 22–32)
Calcium: 8.4 mg/dL — ABNORMAL LOW (ref 8.9–10.3)
Chloride: 119 mmol/L — ABNORMAL HIGH (ref 98–111)
Creatinine, Ser: 2.59 mg/dL — ABNORMAL HIGH (ref 0.44–1.00)
GFR, Estimated: 17 mL/min — ABNORMAL LOW (ref >60)
Glucose, Bld: 337 mg/dL — ABNORMAL HIGH (ref 70–99)
Potassium: 4.8 mmol/L (ref 3.5–5.1)
Sodium: 149 mmol/L — ABNORMAL HIGH (ref 135–145)
Total Bilirubin: 1.5 mg/dL — ABNORMAL HIGH (ref 0.3–1.2)
Total Protein: 5.2 g/dL — ABNORMAL LOW (ref 6.5–8.1)

## 2020-10-10 LAB — PHOSPHORUS: Phosphorus: 5.8 mg/dL — ABNORMAL HIGH (ref 2.5–4.6)

## 2020-10-10 LAB — APTT: aPTT: 120 seconds — ABNORMAL HIGH (ref 24–36)

## 2020-10-10 MED ORDER — LORAZEPAM 2 MG/ML PO CONC: 1.0000 mg | ORAL | Status: DC | PRN

## 2020-10-10 MED ORDER — INSULIN ASPART 100 UNIT/ML IJ SOLN: 0.0000 [IU] | INTRAMUSCULAR | Status: DC

## 2020-10-10 MED ORDER — LORAZEPAM 2 MG/ML IJ SOLN
1.0000 mg | INTRAMUSCULAR | Status: DC | PRN
  Administered 2020-10-10 – 2020-10-11 (×2): 1 mg via INTRAVENOUS
  Filled 2020-10-10 (×3): qty 1

## 2020-10-10 MED ORDER — FUROSEMIDE 10 MG/ML IJ SOLN: 80.0000 mg | Freq: Two times a day (BID) | INTRAMUSCULAR | Status: DC

## 2020-10-10 MED ORDER — INSULIN ASPART 100 UNIT/ML IJ SOLN
2.0000 [IU] | INTRAMUSCULAR | Status: DC
  Administered 2020-10-10: 6 [IU] via SUBCUTANEOUS

## 2020-10-10 MED ORDER — ONDANSETRON HCL 4 MG/2ML IJ SOLN: 4.0000 mg | Freq: Four times a day (QID) | INTRAMUSCULAR | Status: DC | PRN

## 2020-10-10 MED ORDER — LORAZEPAM 1 MG PO TABS: 1.0000 mg | ORAL_TABLET | ORAL | Status: DC | PRN

## 2020-10-10 MED ORDER — MORPHINE 100MG IN NS 100ML (1MG/ML) PREMIX INFUSION
1.0000 mg/h | INTRAVENOUS | Status: DC
  Administered 2020-10-10: 1 mg/h via INTRAVENOUS
  Filled 2020-10-10: qty 100

## 2020-10-10 MED ORDER — FUROSEMIDE 10 MG/ML IJ SOLN
40.0000 mg | Freq: Once | INTRAMUSCULAR | Status: AC
  Administered 2020-10-10: 40 mg via INTRAVENOUS
  Filled 2020-10-10: qty 4

## 2020-10-10 MED ORDER — ONDANSETRON 4 MG PO TBDP: 4.0000 mg | ORAL_TABLET | Freq: Four times a day (QID) | ORAL | Status: DC | PRN

## 2020-10-10 NOTE — Progress Notes (Signed)
CCM family communication note  Attempted to contact family listed in chart, no answer  Will continue as able    Eliseo Gum MSN, AGACNP-BC [place] 10/10/2020, 10:19 AM

## 2020-10-10 NOTE — Progress Notes (Signed)
NAME:  Michelle [name], MRN:  [id number], DOB:  1930-09-23, LOS: 19 ADMISSION DATE:  10/12/2020, CONSULTATION DATE:  10/01/2020 REFERRING MD:  Dr. Sloan Leiter, CHIEF COMPLAINT:  Respiratory Distress    History of Present Illness:  87 yr old female presented to ER with chest pain and dyspnea.  Found to have small PE and started on heparin gtt.  Urine culture positive for Pseudomonas.  Developed worsening hypoxia and tachycardia from HCAP and transferred to ICU.  She had admission in July 2022 for sepsis from Aerococcus bacteremia, and acute ITP.  Pertinent  Medical History  HTN, Breast CA on ibrance, GERD, CVA  Significant Hospital Events: Including procedures, antibiotic start and stop dates in addition to other pertinent events   8/16 admitted to ED 8/18 transferred to ICU  8/21 Made DNR after family discussion 8/23 Hgb 6.3, transfuse with 2 units of PRBC 8/24 was having air hunger 8/25 On BiPAP with adequate saturation 8/26 cortrak placed  8/27 BNP 2800 remains 8L pos WBC 28   Interim History / Subjective:  White count rising BNP elevated   Remains on BiPAP   Desats very rapidly off bipap   Bladder scan with <277m in bladder  Renal function worse   Objective   Blood pressure (!) 161/43, pulse (!) 103, temperature 97.9 F (36.6 C), temperature source Axillary, resp. rate (!) 23, height '5\' 1"'$  (1.549 m), weight 57 kg, SpO2 100 %.    Vent Mode: PCV;BIPAP FiO2 (%):  [80 %] 80 % Set Rate:  [10 bmp] 10 bmp PEEP:  [8 cmH20] 8 cmH20   Intake/Output Summary (Last 24 hours) at 10/10/2020 0937 Last data filed at 10/10/2020 0800 Gross per 24 hour  Intake 1134.21 ml  Output 450 ml  Net 684.21 ml   Filed Weights   10/09/20 0600 10/10/20 0000 10/10/20 0437  Weight: 57.8 kg 57 kg 57 kg   Examination:   Appearance Chronically and critically ill appearing elderly F reclined in bed on BiPAP  HEENT [place]. Mepilex pads over bridge of nose, below mouth -- underneath BiPAP mask. BiPAP in place.    Respiratory - NPPV supported. Symmetrical chest expansion, increased RR. Crackles  CV rrr s1s2 no rgm  GI -soft round ndnt  MSK - chronic arthritic changes to hands and feet. No acute joint deformity. BUE BLE extremity edema  Skin - scattered ecchymosis. Pale, c/d Neuro - Somnolent. opens eyes to name, Does not follow commands.   Resolved Hospital Problem list   Elevated troponin from demand ischemia 2nd to PE  Assessment & Plan:   Acute hypoxemic respiratory failure HCAP Acute PE  Pulmonary edema  -ECHO 8/16 without evidence of acute HF -- BNP 8/25 2800s  -simply, the patient is at an end stage of respiratory failure which NPPV cannot resolve. I agree that advanced airway would also not be of benefit  P -remains on BiPAP. This is not a good solution for this patient both acutely and long term -continue GOC efforts  -diurese  Leukocytosis -had a Psa UTI, HCAP this admit -WBC uptrending since 8/24, now 28. Is on steroids but not sure this accounts for totality of rise.  -did have 5d abx( 3d cefepime 2d mero)  P -check a PCT     AKI Volume overload  P -renal function slightly worse but need to diurese. Big picture I think this underscores the overlapping decompensating organ systems -give '40mg'$  Lasix  -trend BMP   Elevated LFTs -Trend PRN  Hx breast cancer  -  Was on Arimidex -Ibrance on hold  Hx ITP -chronic pred use   Hyperglycemia -SSI  Goals of Care/ DNR status/ Encounter for palliative care  - Made DNR 10/04/20 - BiPAP ok  - continue goals of care  Best Practice (right click and "Reselect all SmartList Selections" daily)  Diet/type: Has not been able to eat as she has been on BiPAP DVT prophylaxis: Heparin GI prophylaxis: not indicated Code Status:  full code Last date of multidisciplinary goals of care discussion:    Labs    CMP Latest Ref Rng & Units 10/10/2020 10/09/2020 10/08/2020  Glucose 70 - 99 mg/dL 337(H) 235(H) 197(H)  BUN 8 - 23 mg/dL  111(H) 98(H) 69(H)  Creatinine 0.44 - 1.00 mg/dL 2.59(H) 2.44(H) 1.75(H)  Sodium 135 - 145 mmol/L 149(H) 150(H) 145  Potassium 3.5 - 5.1 mmol/L 4.8 4.4 4.4  Chloride 98 - 111 mmol/L 119(H) 120(H) 116(H)  CO2 22 - 32 mmol/L 20(L) 18(L) 16(L)  Calcium 8.9 - 10.3 mg/dL 8.4(L) 7.8(L) 8.0(L)  Total Protein 6.5 - 8.1 g/dL 5.2(L) 4.7(L) 4.6(L)  Total Bilirubin 0.3 - 1.2 mg/dL 1.5(H) 1.5(H) 1.5(H)  Alkaline Phos 38 - 126 U/L 136(H) 116 115  AST 15 - 41 U/L 260(H) 342(H) 1,415(H)  ALT 0 - 44 U/L 628(H) 682(H) 1,101(H)    CBC Latest Ref Rng & Units 10/10/2020 10/09/2020 10/08/2020  WBC 4.0 - 10.5 K/uL 28.0(H) 21.5(H) 18.1(H)  Hemoglobin 12.0 - 15.0 g/dL 8.1(L) 8.5(L) 7.4(L)  Hematocrit 36.0 - 46.0 % 27.1(L) 26.7(L) 24.4(L)  Platelets 150 - 400 K/uL 150 172 209    ABG    Component Value Date/Time   PHART 7.503 (H) 10/02/2020 0307   PCO2ART 34.1 10/02/2020 0307   PO2ART 81 (L) 10/02/2020 0307   HCO3 26.9 10/02/2020 0307   TCO2 28 10/02/2020 0307   ACIDBASEDEF 3.0 (H) 10/01/2020 1520   O2SAT 97.0 10/02/2020 0307   CBG (last 3)  Recent Labs    10/10/20 0309 10/10/20 0524 10/10/20 0715  GLUCAP 312* 281* 245*   CRITICAL CARE Performed by: Cristal Generous   Total critical care time: 37 minutes  Critical care time was exclusive of separately billable procedures and treating other patients. Critical care was necessary to treat or prevent imminent or life-threatening deterioration.  Critical care was time spent personally by me on the following activities: development of treatment plan with patient and/or surrogate as well as nursing, discussions with consultants, evaluation of patient's response to treatment, examination of patient, obtaining history from patient or surrogate, ordering and performing treatments and interventions, ordering and review of laboratory studies, ordering and review of radiographic studies, pulse oximetry and re-evaluation of patient's condition.  Eliseo Gum  MSN, AGACNP-BC [place] for pager  10/10/2020, 9:37 AM

## 2020-10-10 NOTE — Progress Notes (Signed)
[facility] Progress Note Patient Name: Michelle [name] DOB: Dec 12, 1930 MRN: [id number]   Date of Service  10/10/2020  HPI/Events of Note  Blood sugar running high since enteral feeding was started, patient just received 7 units of Insulin per protocol.  eICU Interventions  Order to check CBG at 5:15 AM and notify E-Link if BS > 220 mg / dl entered.        [name] Michelle [name] 10/10/2020, 4:17 AM

## 2020-10-10 NOTE — Progress Notes (Signed)
[facility] Progress Note Patient Name: Michelle [name] DOB: [birth year] MRN: [id number]   Date of Service  10/10/2020  HPI/Events of Note  plan was to transition to comfort measures when family arrived.  family now at bedside. Ready to tansition. Plan is to remove BiPap. family conerned about air hunger leading to discomfort.   Camera:  All family at bed side.  Discussed with [id number], orders reviewed.  eICU Interventions  Comfort, palliative measures ordered. DNR.      Intervention Category Intermediate Interventions: Other:  Elmer Sow 10/10/2020, 8:19 PM

## 2020-10-10 NOTE — Progress Notes (Addendum)
  Update -- Family now planning for transition to comfort care 8/27 night -- likely 2000   Night team to place comfort measures when family ready  ________________________    CCM Family Communication Note   I spoke with son [name] on the phone (he was present via speaker phone for the last few minutes of the conversation this morning, detailed in another note)   Discussed clinical case, trajectory moving forward. [name] shares that the family has decided to transition to comfort care 8/28 likely between 1400-1500 -- they have reached an understanding regarding the nature of the patient's critical illness and are trying to coordinate family travel for final goodbyes.   In interim, we can remove cortrak, dc labs, etc -- discussed specifically with [name].    Will continue NPPV   Eliseo Gum MSN, AGACNP-BC [place] for pager  10/10/2020, 3:17 PM

## 2020-10-10 NOTE — Progress Notes (Signed)
PCCM Goals of care / family communication note   I met with daughter and daughter in law (an ICU nurse) at the bedside, with son Jerrye Beavers on speaker phone. Lise RN present throughout.  We discussed clinical updates. I shared by concerns for progressive multisystem organ dysfunction, BiPAP dependence, encephalopathy -- and my concern that in totality this amounts to progression toward end of life.   I recommended transitioning to comfort care. We discussed what this entails to family satisfaction. I do believe patient will quickly deteriorate when off NPPV. Family would like to talk to others not present and get back in touch with CCM team (RN or NP ).    Goals of Care / Encounter for palliative Care P Will continue current care while awaiting family decision Likely transition to comfort care    Additional CCT: 18 minutes      Eliseo Gum MSN, AGACNP-BC [place] for pager  10/10/2020, 11:33 AM

## 2020-10-10 NOTE — Progress Notes (Signed)
ANTICOAGULATION CONSULT NOTE -Follow-Up  Pharmacy Consult for heparin Indication: pulmonary embolus  No Known Allergies  Patient Measurements: ~50 kg  Vital Signs: Temp: 98 F (36.7 C) (08/27 0333) Temp Source: Axillary (08/27 0333) BP: 154/39 (08/27 0645) Pulse Rate: 100 (08/27 0645)  Labs: Recent Labs    10/08/20 0436 10/08/20 1335 10/08/20 1336 10/09/20 0416 10/09/20 0418 10/09/20 1515 10/10/20 0325  HGB 7.4*  --   --   --  8.5*  --  8.1*  HCT 24.4*  --   --   --  26.7*  --  27.1*  PLT 209  --   --   --  172  --  150  APTT 83* 86*  --   --  84*  --  120*  LABPROT 27.3*  --   --   --   --   --   --   INR 2.5*  --   --   --   --   --   --   HEPARINUNFRC  --   --  >1.10* >1.10*  --   --  >1.10*  CREATININE 1.75*  --   --   --   --  2.44* 2.59*     Estimated Creatinine Clearance: 11.3 mL/min (A) (by C-G formula based on SCr of 2.59 mg/dL (H)).   Medical History: Past Medical History:  Diagnosis Date   Arthritis    Cancer ([place])    Bilaterial Breast Cancer   Dyspnea    GERD (gastroesophageal reflux disease)    Hypertension    Stroke [facility])    "mini stroke" per patient     Assessment: 87 y/o F with history of breast cancer, found to have small PE 09/20/2020. No anticoagulation prior to admission. Pharmacy consulted for heparin then changed to eliquis from 8/17-8/21 AM.  Pt now NPO on BiPAP, pharmacy has been consulted for heparin dosing.   aPTT 120 on heparin 650 units/hr after being therapuetic x 2 days. Level drawn appropriately per RN. Hgb 8.1. Plt 150. No bleeding noted    Goal of Therapy:  Heparin level 0.3-0.7 units/ml, aPTT 66-102 sec Monitor platelets by anticoagulation protocol: Yes   Plan:   Decrease heparin to 550 units/hr  Check 8 hr aPTT Monitor aPTT, heparin level, CBC and s/s of bleeding daily  Follow up correlation of aPTT and heparin level to be able to adjust based on heparin level when correlating   Cristela Felt, PharmD, BCPS Clinical  Pharmacist 10/10/2020 7:03 AM

## 2020-10-10 NOTE — Progress Notes (Signed)
Palliative:  HPI/Patient Profile: 87 y.o. female  with past medical history of hypertension, breast cancer on ibrance, and GERD admitted on 09/22/2020 with chest pain and dyspnea. Found to have small PE and started on heparin gtt.  Urine culture positive for Pseudomonas.  Developed worsening hypoxia and tachycardia from HCAP and transferred to ICU. Was made DNR 8/21 after CCM discussion with family. Required bipap for several days - small doses of sedation needed for bipap tolerance. Remains unable to wean from bipap. PMT consulted to discuss .  Discussion: Medical records reviewed. Assessed patient at the bedside and met with her daughter [name], as well as her son Jerrye Beavers via speaker phone. Patient remains unresponsive and dependent on BiPAP.  Followed up on recent goals of care meeting. Family is hoping for improvement while continuing conversations based on findings such as worsening labs and unstable vital signs. They also mention hoping that their mother will begin interacting more. I empathized with their desire to continue monitoring for signs of decline and with their permission, I shared updates on patient's worsening labs today including renal function and WBC count. Jerrye Beavers is understandably shocked to hear this and asks about potentially "cycling" antibiotics. Counseled on the risk of overprescribing antibiotics and updated on the plan to check PCT. I shared my concern that this overall decline is likely due to patient's body and organs being under an immense amount of stress, rather than infection.   Jerrye Beavers seeks clarification that our team would be available to assist with transition to comfort care if needed and I confirmed. He is not ready to make this decision but agrees to reach out if they are ready to discuss further, or for any additional needs.  Questions and concerns addressed. PMT will continue to support holistically.   Exam: Non-responsive. Remains on bipap - 80% FiO2, and  tachypneic. Normal heart rate. Remains on pressors. No distress noted. Frail.  Assessment: Acute hypoxic respiratory failure, goals of care conversation  Plan:  -Continue full scope treatment; family understands that patient is declining further and will continue discussing goals of care -Psychosocial and emotional support provided -Ongoing GOC discussions  Total time: 15 minutes Greater than 50% of this time was spent in counseling and coordinating care related to the above assessment and plan.  Dorthy Cooler, PA-C Palliative Medicine Team Team phone # [phone]  Thank you for allowing the Palliative Medicine Team to assist in the care of this patient. Please utilize secure chat with additional questions, if there is no response within 30 minutes please call the above phone number.  Palliative Medicine Team providers are available by phone from 7am to 7pm daily and can be reached through the team cell phone.  Should this patient require assistance outside of these hours, please call the patient's attending physician.

## 2020-10-10 NOTE — Progress Notes (Signed)
[facility] Progress Note Patient Name: Michelle [name] DOB: 07/23/1930 MRN: [id number]   Date of Service  10/10/2020  HPI/Events of Note  Patient with persistent hyperglycemia (blood sugar 280 mg / dl).  eICU Interventions  Patient switched from sensitive to standard SQ Insulin regimen        Frederik Pear 10/10/2020, 5:33 AM

## 2020-10-11 DIAGNOSIS — Z66 Do not resuscitate: Secondary | ICD-10-CM

## 2020-10-11 DIAGNOSIS — Z515 Encounter for palliative care: Secondary | ICD-10-CM | POA: Diagnosis not present

## 2020-10-11 MED ORDER — DEXTROSE 5 % IV SOLN: INTRAVENOUS | Status: DC

## 2020-10-11 MED ORDER — MORPHINE SULFATE (PF) 2 MG/ML IV SOLN
2.0000 mg | INTRAVENOUS | Status: DC | PRN
  Administered 2020-10-11: 2 mg via INTRAVENOUS

## 2020-10-11 MED ORDER — MORPHINE 100MG IN NS 100ML (1MG/ML) PREMIX INFUSION: 0.0000 mg/h | INTRAVENOUS | Status: DC

## 2020-10-11 MED ORDER — MORPHINE BOLUS VIA INFUSION
5.0000 mg | INTRAVENOUS | Status: DC | PRN
  Filled 2020-10-11: qty 5

## 2020-10-11 MED ORDER — ACETAMINOPHEN 325 MG PO TABS: 650.0000 mg | ORAL_TABLET | Freq: Four times a day (QID) | ORAL | Status: DC | PRN

## 2020-10-11 MED ORDER — ACETAMINOPHEN 650 MG RE SUPP: 650.0000 mg | Freq: Four times a day (QID) | RECTAL | Status: DC | PRN

## 2020-10-11 MED ORDER — GLYCOPYRROLATE 1 MG PO TABS: 1.0000 mg | ORAL_TABLET | ORAL | Status: DC | PRN

## 2020-10-11 MED ORDER — POLYVINYL ALCOHOL 1.4 % OP SOLN: 1.0000 [drp] | Freq: Four times a day (QID) | OPHTHALMIC | Status: DC | PRN

## 2020-10-11 MED ORDER — GLYCOPYRROLATE 0.2 MG/ML IJ SOLN: 0.2000 mg | INTRAMUSCULAR | Status: DC | PRN

## 2020-10-11 MED ORDER — DIPHENHYDRAMINE HCL 50 MG/ML IJ SOLN: 25.0000 mg | INTRAMUSCULAR | Status: DC | PRN

## 2020-10-15 ENCOUNTER — Ambulatory Visit: Payer: Medicare HMO | Admitting: Adult Health

## 2020-10-15 ENCOUNTER — Other Ambulatory Visit: Payer: Medicare HMO

## 2020-10-15 NOTE — Progress Notes (Signed)
Wasted 50 ml Morphine with Ramonita Lab, RN in the medication room in the stericycle bin.  Michelle [name]

## 2020-10-15 NOTE — Progress Notes (Signed)
PCCM Interval Note   Patient with VF on monitor followed by asystole    On exam, patient is apneic. She is pulseless. There are no heart sounds to auscultation, there are no breath sounds to auscultation.   Time of death: Gratis, patient's son, at bedside.     Eliseo Gum MSN, AGACNP-BC [facility] 13-Oct-2020, 10:12 AM

## 2020-10-15 NOTE — Progress Notes (Signed)
NAME:  Michelle [name], MRN:  [id number], DOB:  [birth year], LOS: 6 ADMISSION DATE:  09/14/2020, CONSULTATION DATE:  10/01/2020 REFERRING MD:  Dr. Sloan Leiter, CHIEF COMPLAINT:  Respiratory Distress    History of Present Illness:  87 yr old female presented to ER with chest pain and dyspnea.  Found to have small PE and started on heparin gtt.  Urine culture positive for Pseudomonas.  Developed worsening hypoxia and tachycardia from HCAP and transferred to ICU.  She had admission in July 2022 for sepsis from Aerococcus bacteremia, and acute ITP.  Pertinent  Medical History  HTN, Breast CA on ibrance, GERD, CVA  Significant Hospital Events: Including procedures, antibiotic start and stop dates in addition to other pertinent events   8/16 admitted to ED 8/18 transferred to ICU  8/21 Made DNR after family discussion 8/23 Hgb 6.3, transfuse with 2 units of PRBC 8/24 was having air hunger 8/25 On BiPAP with adequate saturation 8/26 cortrak placed  8/27 BNP 2800 remains 8L pos WBC 28. Transitioned to [facility] 8/28 comfort meds liberalized   Interim History / Subjective:  Transitioned to comfort care 8/27 evening.  On '1mg'$ /hr fixed rate morphine gtt   She is using accessory muscles. I gave a '2mg'$  morphine bolus and increased gtt to '4mg'$ /hr -- d/w Cori RN  Objective   Blood pressure (!) 173/43, pulse (!) 115, temperature 97.9 F (36.6 C), temperature source Axillary, resp. rate 18, height '5\' 1"'$  (1.549 m), weight 57 kg, SpO2 97 %.    Vent Mode: PCV;BIPAP FiO2 (%):  [80 %] 80 % Set Rate:  [10 bmp] 10 bmp PEEP:  [8 cmH20] 8 cmH20   Intake/Output Summary (Last 24 hours) at 2020-10-30 [id number] Last data filed at 2020-10-30 0700 Gross per 24 hour  Intake 453.63 ml  Output 750 ml  Net -296.37 ml   Filed Weights   10/09/20 0600 10/10/20 0000 10/10/20 0437  Weight: 57.8 kg 57 kg 57 kg   Examination:   Appearance Chronically ill appearing elderly female, appears at end of life HEENT [place]. Trachea midline   Respiratory - Symmetrical chest expansion. Trap muscle sue  CV rrr  GI -soft ndnt  MSK - Chronic arthritic changes, edema  Skin - pale c/d/w Neuro - Does not respond to pain   Resolved Hospital Problem list   Elevated troponin from demand ischemia 2nd to PE  Assessment & Plan:     Encounter for palliative care DNR Status  - Transitioned to comfort care 8/27 evening - I am changing fixed dose morphine gtt to titratable gtt + PRN bolus  -transfer to palli floor - transfer to [facility] 8/29 if survives    Acute hypoxemic respiratory failure HCAP Acute PE  Pulmonary edema  Leukocytosis AKI Volume overload  Elevated LFTs Hx breast cancer  Hx ITP Hyperglycemia     Best Practice (right click and "Reselect all SmartList Selections" daily)  Code Status: DNR, comfort measures only  Dispo: palliative, suspect in-hospital death    Labs    CMP Latest Ref Rng & Units 10/10/2020 10/09/2020 10/08/2020  Glucose 70 - 99 mg/dL 337(H) 235(H) 197(H)  BUN 8 - 23 mg/dL 111(H) 98(H) 69(H)  Creatinine 0.44 - 1.00 mg/dL 2.59(H) 2.44(H) 1.75(H)  Sodium 135 - 145 mmol/L 149(H) 150(H) 145  Potassium 3.5 - 5.1 mmol/L 4.8 4.4 4.4  Chloride 98 - 111 mmol/L 119(H) 120(H) 116(H)  CO2 22 - 32 mmol/L 20(L) 18(L) 16(L)  Calcium 8.9 - 10.3 mg/dL 8.4(L) 7.8(L) 8.0(L)  Total  Protein 6.5 - 8.1 g/dL 5.2(L) 4.7(L) 4.6(L)  Total Bilirubin 0.3 - 1.2 mg/dL 1.5(H) 1.5(H) 1.5(H)  Alkaline Phos 38 - 126 U/L 136(H) 116 115  AST 15 - 41 U/L 260(H) 342(H) 1,415(H)  ALT 0 - 44 U/L 628(H) 682(H) 1,101(H)    CBC Latest Ref Rng & Units 10/10/2020 10/09/2020 10/08/2020  WBC 4.0 - 10.5 K/uL 28.0(H) 21.5(H) 18.1(H)  Hemoglobin 12.0 - 15.0 g/dL 8.1(L) 8.5(L) 7.4(L)  Hematocrit 36.0 - 46.0 % 27.1(L) 26.7(L) 24.4(L)  Platelets 150 - 400 K/uL 150 172 209    ABG    Component Value Date/Time   PHART 7.503 (H) 10/02/2020 0307   PCO2ART 34.1 10/02/2020 0307   PO2ART 81 (L) 10/02/2020 0307   HCO3 26.9 10/02/2020 0307    TCO2 28 10/02/2020 0307   ACIDBASEDEF 3.0 (H) 10/01/2020 1520   O2SAT 97.0 10/02/2020 0307   CBG (last 3)  Recent Labs    10/10/20 0524 10/10/20 0715 10/10/20 1136  GLUCAP 281* 245* 274*   CCT: n/a    Eliseo Gum MSN, AGACNP-BC [place] for pager  11/08/2020, 9:38 AM

## 2020-10-15 NOTE — Progress Notes (Signed)
   Palliative Medicine Inpatient Follow Up Note   I was able to secure chat with patients RN, Darrell this morning. He shared with me that Michelle [name] had passed away at 0953 AM.   PMT is available if additional family support is needed.  ______________________________________________________________________________________ [place] Team Team Cell Phone: [phone] Please utilize secure chat with additional questions, if there is no response within 30 minutes please call the above phone number  Palliative Medicine Team providers are available by phone from 7am to 7pm daily and can be reached through the team cell phone.  Should this patient require assistance outside of these hours, please call the patient's attending physician.

## 2020-10-15 DEATH — deceased

## 2020-11-14 NOTE — Discharge Summary (Signed)
DEATH SUMMARY   Patient Details  Name: Michelle [name] MRN: [id number] DOB: 17-Jul-1930  Admission/Discharge Information   Admit Date:  10/25/2020  Date of Death: Date of Death: 11-06-20  Time of Death: Time of Death: 0953  Length of Stay: May 22, 2022  Referring Physician: [name], MD   Reason(s) for Hospitalization  Septic shock  Diagnoses  Preliminary cause of death:   Acute hypoxic respiratory failure Secondary Diagnoses (including complications and co-morbidities):  Principal Problem:   Acute pulmonary embolism without acute cor pulmonale ([place]) Active Problems:   Arthritis   Malignant neoplasm of overlapping sites of right breast in female, estrogen receptor positive (HCC)   Anemia   SIRS (systemic inflammatory response syndrome) (HCC)   Transient hypotension   Elevated troponin   Dysuria   History of ITP   History of bacteremia   Acute lower UTI   HCAP (healthcare-associated pneumonia)   Goals of care, counseling/discussion   Acute respiratory failure with hypoxia [facility])   Pulmonary infiltrates   Brief Hospital Course (including significant findings, care, treatment, and services provided and events leading to death)  TAHJAE [name] is a 87 y.o. year old female with a history of prior CVA and breast cancer.  Generalized frailty at baseline.  Admitted to hospital with complaints of chest pain and shortness of breath found to have a small pulmonary embolism and urinary culture positive for Pseudomonas.  Initially thought to have septic shock due to pyelonephritis.  Because of worsening hypoxia she was transferred to the ICU.  Given her overall frailty she was made a DNR but the family continue to hold out hope that she would somehow managed to recover.  She remained unfortunately generally unresponsive with severe anasarca marginal blood pressures and BiPAP dependent.  Did not tolerate diuresis.  The ICU team and palliative care we engaged the family who at this point  agreed to a full transition to comfort care.  The patient passed away without distress.    Pertinent Labs and Studies  Significant Diagnostic Studies DG CHEST PORT 1 VIEW  Result Date: 10/10/2020 CLINICAL DATA:  Pulmonary infiltrates. EXAM: PORTABLE CHEST 1 VIEW COMPARISON:  October 07, 2020. FINDINGS: The heart size and mediastinal contours are within normal limits. Feeding tube is seen entering stomach. Stable position of left-sided PICC line. Stable bilateral lung opacities with possible small left pleural effusion. The visualized skeletal structures are unremarkable. IMPRESSION: Stable bilateral lung opacities with possible small left pleural effusion. Electronically Signed   By: Marijo Conception M.D.   On: 10/10/2020 08:37   DG Chest Port 1 View  Result Date: 10/07/2020 CLINICAL DATA:  PICC placement EXAM: PORTABLE CHEST 1 VIEW COMPARISON:  10/07/2020 FINDINGS: Left PICC line is in place with the tip at the cavoatrial junction. Extensive bilateral lung opacities. Heart is borderline in size. Small left pleural effusion. Findings stable since prior study. IMPRESSION: Left PICC line tip at the cavoatrial junction. Otherwise no change. Electronically Signed   By: Rolm Baptise M.D.   On: 10/07/2020 17:30   DG CHEST PORT 1 VIEW  Result Date: 10/07/2020 CLINICAL DATA:  Respiratory failure. EXAM: PORTABLE CHEST 1 VIEW COMPARISON:  October 05, 2020. FINDINGS: Stable cardiomediastinal silhouette. Stable bilateral lung opacities are noted concerning for multifocal pneumonia or possibly edema. No pneumothorax is noted. Small bilateral pleural effusions are noted. Bony thorax is unremarkable. IMPRESSION: Stable bilateral lung opacities as described above with probable small bilateral pleural effusions. Electronically Signed   By: Marijo Conception  M.D.   On: 10/07/2020 10:59   DG Chest Port 1 View  Result Date: 10/05/2020 CLINICAL DATA:  Healthcare associated pneumonia. EXAM: PORTABLE CHEST 1 VIEW  COMPARISON:  10/04/2020 FINDINGS: Persistent bilateral airspace disease with decreased densities in the right upper lung. Heart size is stable. Patient is slightly rotated towards left on these images. Blunting at both costophrenic angles are suggestive for pleural effusions, right side greater than left. Negative for a pneumothorax. IMPRESSION: 1. Persistent bilateral airspace disease. Decreased densities in the right upper lung. 2. Probable bilateral pleural effusions, right side greater than left. Electronically Signed   By: Markus Daft M.D.   On: 10/05/2020 07:50   DG CHEST PORT 1 VIEW  Result Date: 10/04/2020 CLINICAL DATA:  Hypoxia EXAM: PORTABLE CHEST 1 VIEW COMPARISON:  10/02/2020 FINDINGS: Severe diffuse bilateral airspace disease, worsening since prior study. Small to moderate bilateral effusions. Heart is normal size. IMPRESSION: Worsening bilateral airspace disease could reflect edema or infection. Small to moderate bilateral effusions. Electronically Signed   By: Rolm Baptise M.D.   On: 10/04/2020 19:54   DG CHEST PORT 1 VIEW  Result Date: 10/02/2020 CLINICAL DATA:  Hypoxia EXAM: PORTABLE CHEST 1 VIEW COMPARISON:  10/01/2020 FINDINGS: Persistent interstitial thickening and patchy airspace disease throughout the right lung consistent with multilobar pneumonia. Trace right pleural effusion. No left pleural effusion. Mild left lung interstitial thickening and patchy airspace disease. No pneumothorax. Stable cardiomediastinal silhouette. Thoracic aortic atherosclerosis. Osteoarthritis of bilateral glenohumeral joints. IMPRESSION: 1. Persistent right multilobar pneumonia. Left lung interstitial thickening and patchy alveolar airspace disease concerning for left lobe multilobar pneumonia. Electronically Signed   By: Kathreen Devoid M.D.   On: 10/02/2020 08:24   DG CHEST PORT 1 VIEW  Result Date: 10/01/2020 CLINICAL DATA:  Shortness of breath. EXAM: PORTABLE CHEST 1 VIEW COMPARISON:  September 29, 2020.  FINDINGS: Stable cardiomegaly. Interval development of large right upper and lower lobe airspace opacity is noted consistent with pneumonia and associated right pleural effusion. Mild left basilar atelectasis and pleural effusion is noted. Bony thorax is unremarkable. IMPRESSION: Interval development of large right lung opacity is noted consistent with pneumonia and associated right pleural effusion. Mild left basilar atelectasis and pleural effusion is noted. Aortic Atherosclerosis (ICD10-I70.0). Electronically Signed   By: Marijo Conception M.D.   On: 10/01/2020 12:25   DG Abd Portable 1V  Result Date: 10/09/2020 CLINICAL DATA:  Feeding tube placement. EXAM: PORTABLE ABDOMEN - 1 VIEW COMPARISON:  08/19/2007. FINDINGS: The tip of the feeding catheter is in the mid to distal stomach. Mild diffuse gaseous bowel distention noted in the abdomen. Diffuse interstitial and patchy airspace disease noted in the lower lungs bilaterally. IMPRESSION: Feeding tube tip is in the mid to distal stomach. Electronically Signed   By: Misty Stanley M.D.   On: 10/09/2020 14:51   [name] EKG SITE RITE  Result Date: 10/07/2020 If Site Rite image not attached, placement could not be confirmed due to current cardiac rhythm.   Microbiology No results found for this or any previous visit (from the past 240 hour(s)).  Lab Basic Metabolic Panel: No results for input(s): NA, K, CL, CO2, GLUCOSE, BUN, CREATININE, CALCIUM, MG, PHOS in the last 168 hours. Liver Function Tests: No results for input(s): AST, ALT, ALKPHOS, BILITOT, PROT, ALBUMIN in the last 168 hours. No results for input(s): LIPASE, AMYLASE in the last 168 hours. No results for input(s): AMMONIA in the last 168 hours. CBC: No results for input(s): WBC, NEUTROABS, HGB, HCT, MCV,  PLT in the last 168 hours. Cardiac Enzymes: No results for input(s): CKTOTAL, CKMB, CKMBINDEX, TROPONINI in the last 168 hours. Sepsis Labs: No results for input(s): PROCALCITON, WBC,  LATICACIDVEN in the last 168 hours.  Procedures/Operations  BiPAP.   Michah Minton 10/29/2020, 12:26 PM

## 2020-12-01 ENCOUNTER — Other Ambulatory Visit: Payer: Medicare HMO

## 2020-12-01 ENCOUNTER — Ambulatory Visit: Payer: Medicare HMO | Admitting: Oncology

## 2021-10-11 IMAGING — US US BREAST*R* LIMITED INC AXILLA
1 series · 14 of 20 positions shown · non-contrast
Comparison: None.
COMPARISON: None.

Addendum:
CLINICAL DATA: Patient describes a palpable lump within the LEFT
breast.

EXAM:
DIGITAL DIAGNOSTIC BILATERAL MAMMOGRAM WITH CAD AND TOMO
ULTRASOUND BILATERAL BREAST

[Series 1: us breast*right* limited inc axilla · 0.06mm/px · 14 of 20 slices shown]
[im 1/20]
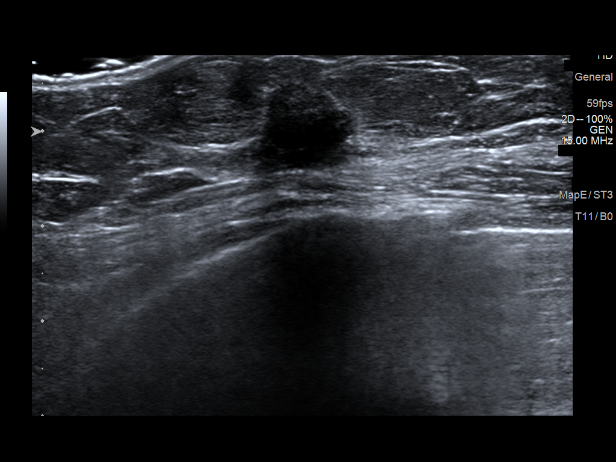
[im 3/20]
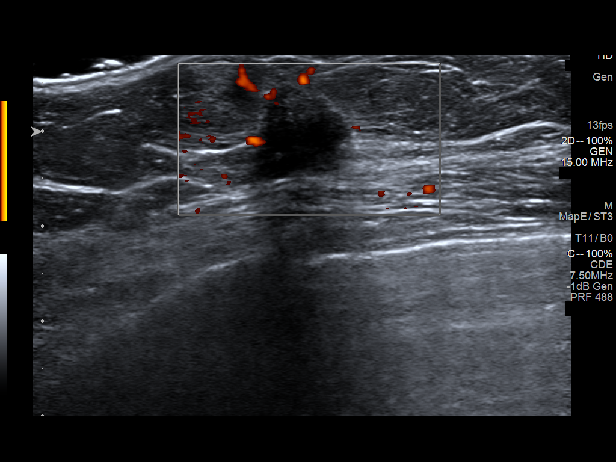
[im 4/20]
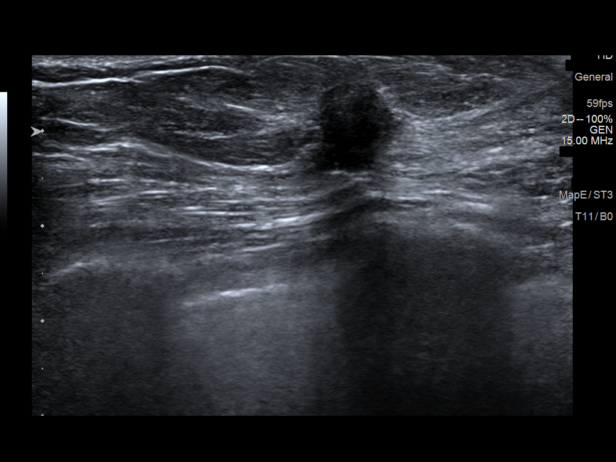
[im 6/20]
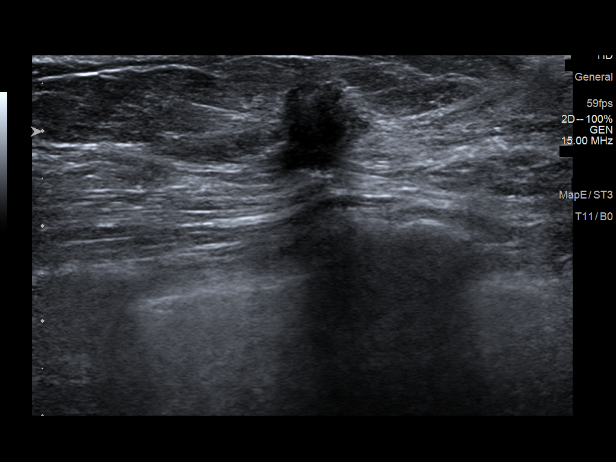
[im 7/20]
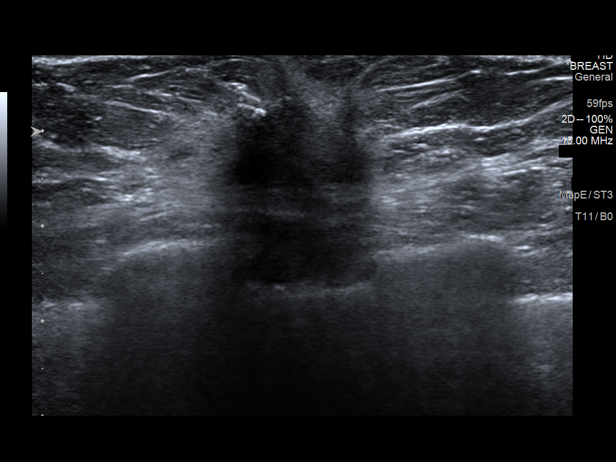
[im 8/20]
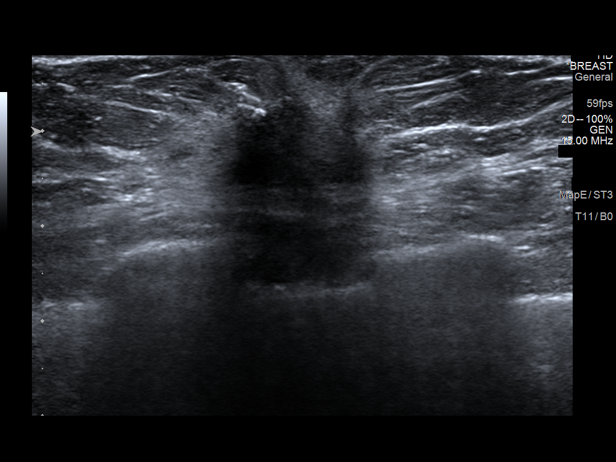
[im 10/20]
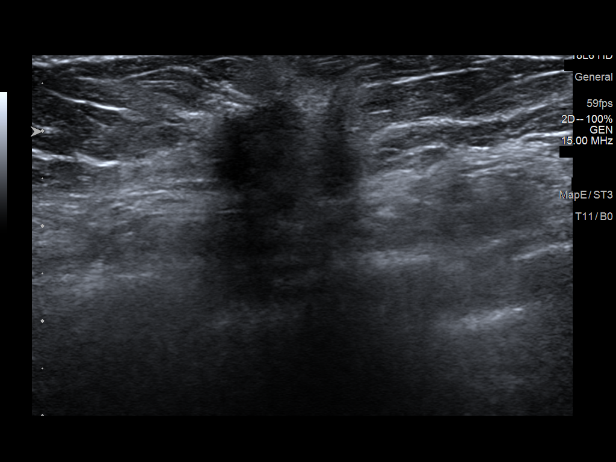
[im 11/20]
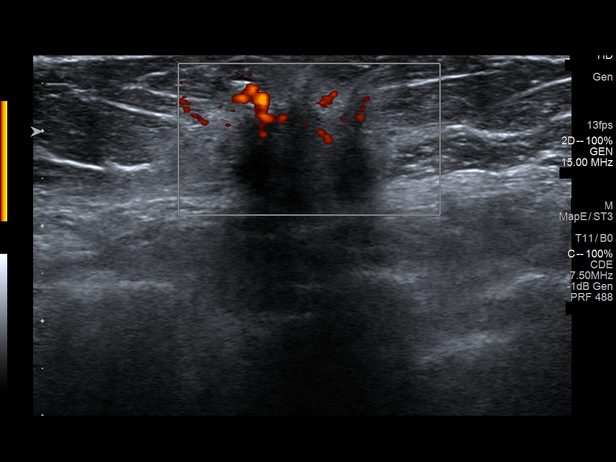
[im 13/20]
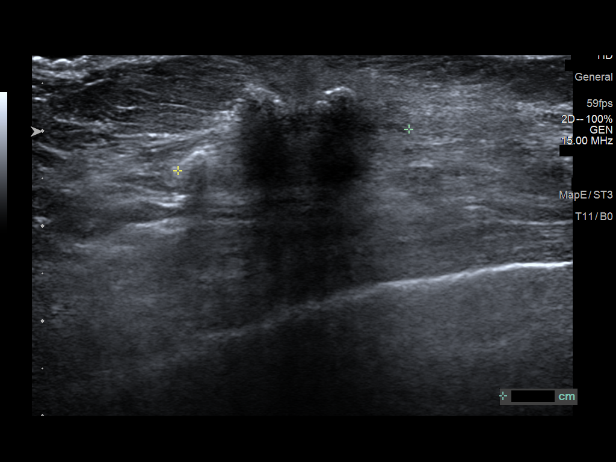
[im 14/20]
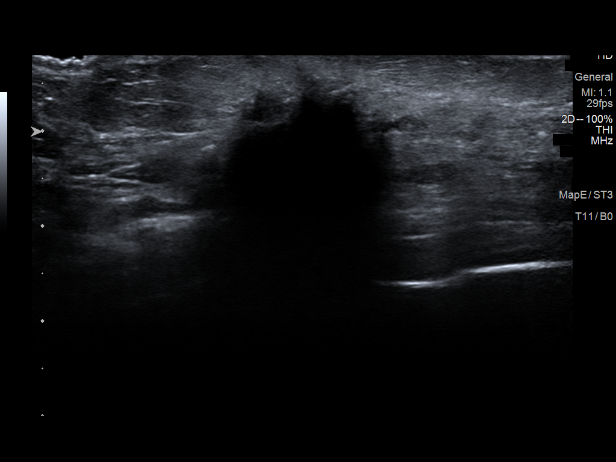
[im 16/20]
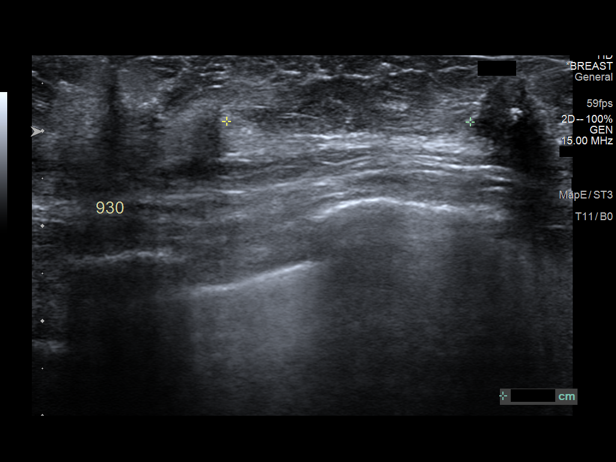
[im 17/20]
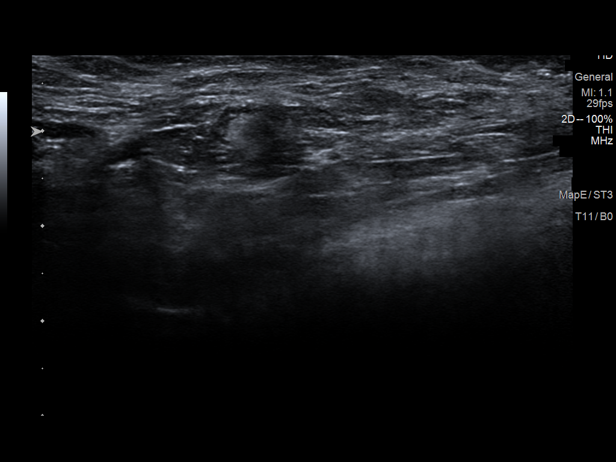
[im 18/20]
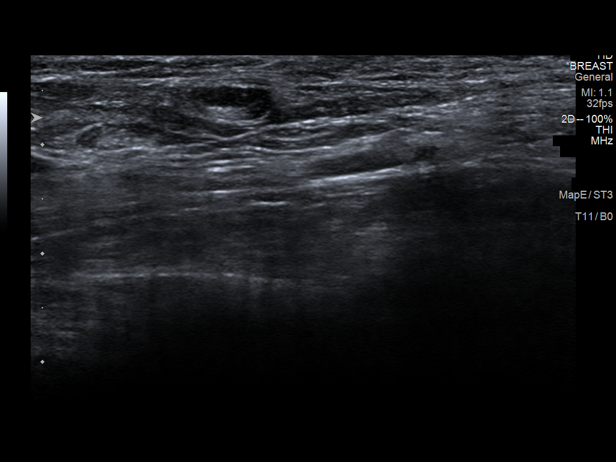
[im 20/20]
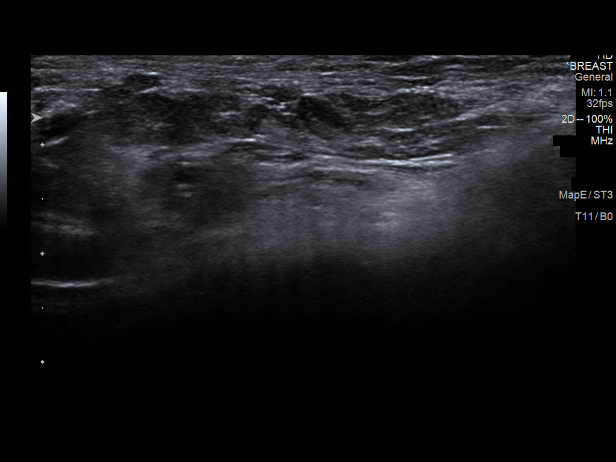

[14 of 20 positions shown; findings below may reference images not displayed]

ACR Breast Density Category b: There are scattered areas of
fibroglandular density.
FINDINGS: LEFT breast: There is a spiculated mass within the slightly upper
outer quadrant of the LEFT breast, partially retroareolar with
retraction of the skin/nipple, measuring approximately 3.3 cm
greatest dimension, with surrounding trabecular thickening raising
the possibility of lymphatic involvement/inflammatory breast cancer.

RIGHT breast: Spiculated mass within the upper RIGHT breast, at
middle depth, 12 o'clock axis region, measuring approximately 1 cm
greatest dimension.

Additional spiculated mass within the outer RIGHT breast, at middle
depth, 9-10 o'clock axis, measuring approximately 1.7 cm greatest
dimension. There is overlying skin retraction and surrounding
trabecular thickening suggesting lymphatic involvement/inflammatory
breast cancer.

LEFT axilla: There are numerous enlarged lymph nodes in the LEFT
axilla.

Mammographic images were processed with CAD.

LEFT breast: Targeted ultrasound is performed, showing an irregular
shadowing mass extending from the 12-2 o'clock axis, 3 cm from the
nipple, measuring approximately 4.9 cm greatest dimension, extending
to and likely involving the overlying skin/nipple.

RIGHT breast: Targeted ultrasound is performed, showing an irregular
hypoechoic mass in the RIGHT breast at the 12 o'clock axis, 3 cm
from the nipple, measuring 1.2 x 1 x 1 cm, with associated
architectural distortion, corresponding to the mammographic finding.

Additional irregular hypoechoic mass in the RIGHT breast at the 9:30
o'clock axis, 3 cm from the nipple, measuring 2.5 x 1.5 x 2.1 cm,
corresponding to the additional mammographic finding.

The 2 RIGHT breast masses are located approximately 2.6 cm apart.

LEFT axilla: There are at least 8 enlarged/morphologically abnormal
lymph nodes in the LEFT axilla.
IMPRESSION: 1. Highly suspicious spiculated mass within the LEFT breast,
extending from 12-2 o'clock axes, 3 cm from the nipple, measuring
approximately 4.9 cm greatest dimension, extending to and likely
involving the overlying skin/nipple with associated skin/nipple
retraction. Surrounding edematous change suggests lymphangitic
involvement/inflammatory breast cancer.
2. Highly suspicious mass within the RIGHT breast at the 12 o'clock
axis, 3 cm from the nipple, measuring 1.2 cm.
3. Highly suspicious mass in the RIGHT breast at the 9:30 o'clock
axis, 3 cm from the nipple, measuring 2.5 cm.
4. Highly suspicious enlarged/morphologically abnormal lymph nodes
in the LEFT axilla (at least 8 abnormal lymph nodes), likely
metastatic lymphadenopathy.

RECOMMENDATION:
1. Ultrasound-guided biopsy of the LEFT breast mass, 12-2 o'clock
axes, measuring approximately 4.9 cm.
2. Ultrasound-guided biopsy of the RIGHT breast mass at the 12
o'clock axis.
3. Ultrasound-guided biopsy of the RIGHT breast mass at the 9:30
o'clock axis.
4. Ultrasound-guided biopsy of 1 of the enlarged/morphologically
abnormal lymph nodes in the LEFT axilla.

Ultrasound-guided biopsy will be performed later today.

I have discussed the findings and recommendations with the patient.
If applicable, a reminder letter will be sent to the patient
regarding the next appointment.

BI-RADS CATEGORY  5: Highly suggestive of malignancy.

ADDENDUM:
RIGHT axilla was evaluated with ultrasound showing no enlarged or
morphologically abnormal lymph nodes.

*** End of Addendum ***
ACR Breast Density Category b: There are scattered areas of
fibroglandular density.
FINDINGS: LEFT breast: There is a spiculated mass within the slightly upper
outer quadrant of the LEFT breast, partially retroareolar with
retraction of the skin/nipple, measuring approximately 3.3 cm
greatest dimension, with surrounding trabecular thickening raising
the possibility of lymphatic involvement/inflammatory breast cancer.

RIGHT breast: Spiculated mass within the upper RIGHT breast, at
middle depth, 12 o'clock axis region, measuring approximately 1 cm
greatest dimension.

Additional spiculated mass within the outer RIGHT breast, at middle
depth, 9-10 o'clock axis, measuring approximately 1.7 cm greatest
dimension. There is overlying skin retraction and surrounding
trabecular thickening suggesting lymphatic involvement/inflammatory
breast cancer.

LEFT axilla: There are numerous enlarged lymph nodes in the LEFT
axilla.

Mammographic images were processed with CAD.

LEFT breast: Targeted ultrasound is performed, showing an irregular
shadowing mass extending from the 12-2 o'clock axis, 3 cm from the
nipple, measuring approximately 4.9 cm greatest dimension, extending
to and likely involving the overlying skin/nipple.

RIGHT breast: Targeted ultrasound is performed, showing an irregular
hypoechoic mass in the RIGHT breast at the 12 o'clock axis, 3 cm
from the nipple, measuring 1.2 x 1 x 1 cm, with associated
architectural distortion, corresponding to the mammographic finding.

Additional irregular hypoechoic mass in the RIGHT breast at the 9:30
o'clock axis, 3 cm from the nipple, measuring 2.5 x 1.5 x 2.1 cm,
corresponding to the additional mammographic finding.

The 2 RIGHT breast masses are located approximately 2.6 cm apart.

LEFT axilla: There are at least 8 enlarged/morphologically abnormal
lymph nodes in the LEFT axilla.
IMPRESSION: 1. Highly suspicious spiculated mass within the LEFT breast,
extending from 12-2 o'clock axes, 3 cm from the nipple, measuring
approximately 4.9 cm greatest dimension, extending to and likely
involving the overlying skin/nipple with associated skin/nipple
retraction. Surrounding edematous change suggests lymphangitic
involvement/inflammatory breast cancer.
2. Highly suspicious mass within the RIGHT breast at the 12 o'clock
axis, 3 cm from the nipple, measuring 1.2 cm.
3. Highly suspicious mass in the RIGHT breast at the 9:30 o'clock
axis, 3 cm from the nipple, measuring 2.5 cm.
4. Highly suspicious enlarged/morphologically abnormal lymph nodes
in the LEFT axilla (at least 8 abnormal lymph nodes), likely
metastatic lymphadenopathy.

RECOMMENDATION:
1. Ultrasound-guided biopsy of the LEFT breast mass, 12-2 o'clock
axes, measuring approximately 4.9 cm.
2. Ultrasound-guided biopsy of the RIGHT breast mass at the 12
o'clock axis.
3. Ultrasound-guided biopsy of the RIGHT breast mass at the 9:30
o'clock axis.
4. Ultrasound-guided biopsy of 1 of the enlarged/morphologically
abnormal lymph nodes in the LEFT axilla.

Ultrasound-guided biopsy will be performed later today.

I have discussed the findings and recommendations with the patient.
If applicable, a reminder letter will be sent to the patient
regarding the next appointment.

BI-RADS CATEGORY  5: Highly suggestive of malignancy.

## 2021-10-11 IMAGING — MG US BREAST BX W LOC DEV 1ST LESION IMG BX SPEC US GUIDE*L*
1 series · 8 of 8 positions shown · non-contrast
Comparison: Previous exam(s).
COMPARISON: Previous exam(s).

Addendum:
CLINICAL DATA: Ultrasound-guided core needle biopsy was recommended
of a palpable left breast mass spanning from [DATE] to [DATE] axis,
periareolar.

EXAM:
ULTRASOUND GUIDED LEFT BREAST CORE NEEDLE BIOPSY

[Series 1: MG view · 0.07mm/px · 8 of 15 slices shown]
[im 1/15]
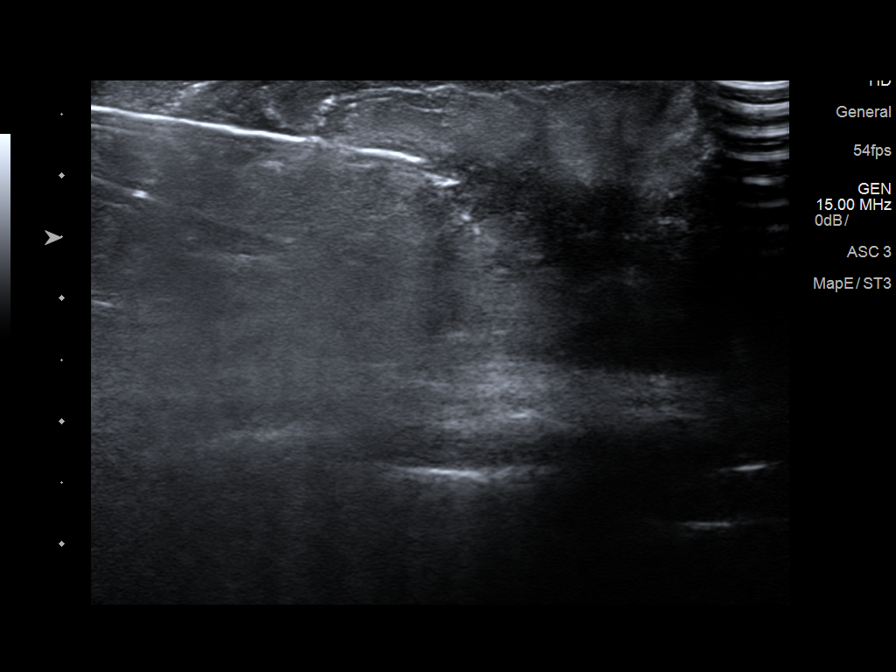
[im 3/15]
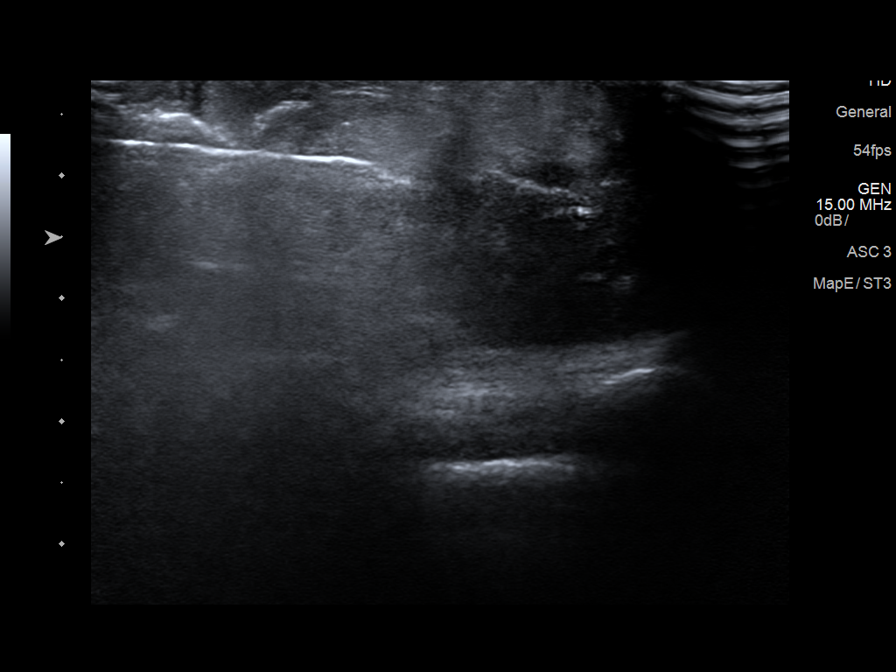
[im 5/15]
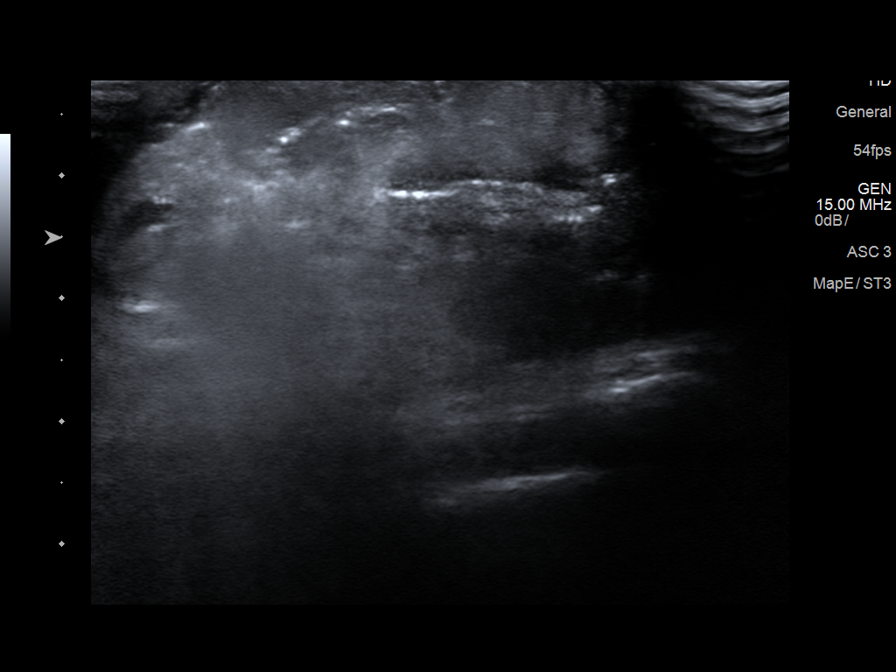
[im 7/15]
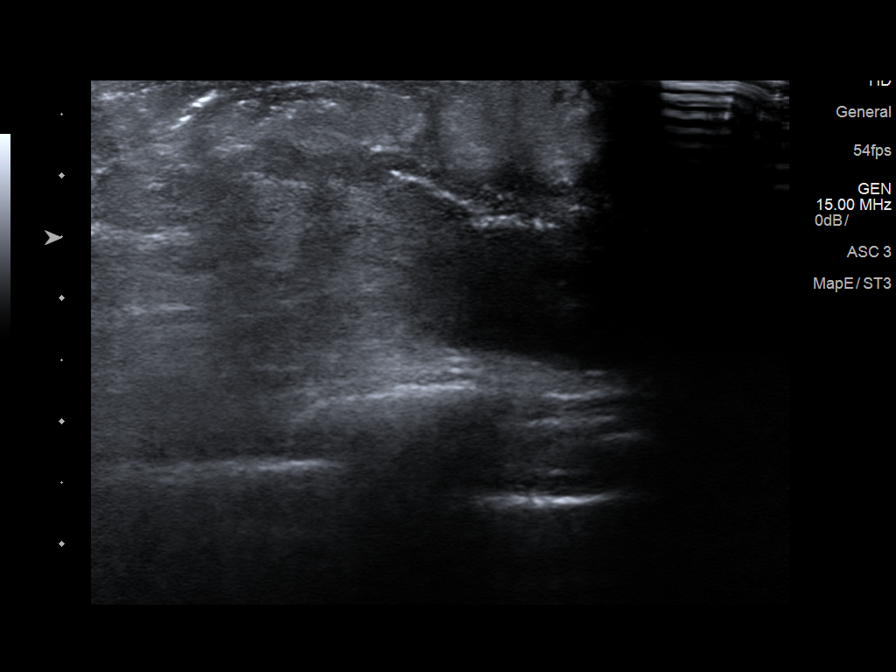
[im 9/15]
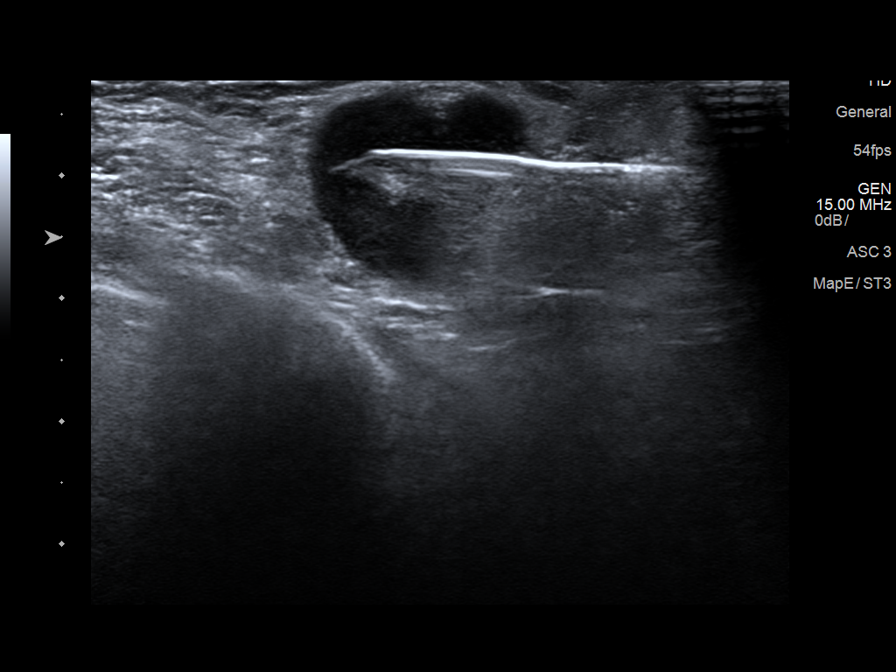
[im 11/15]
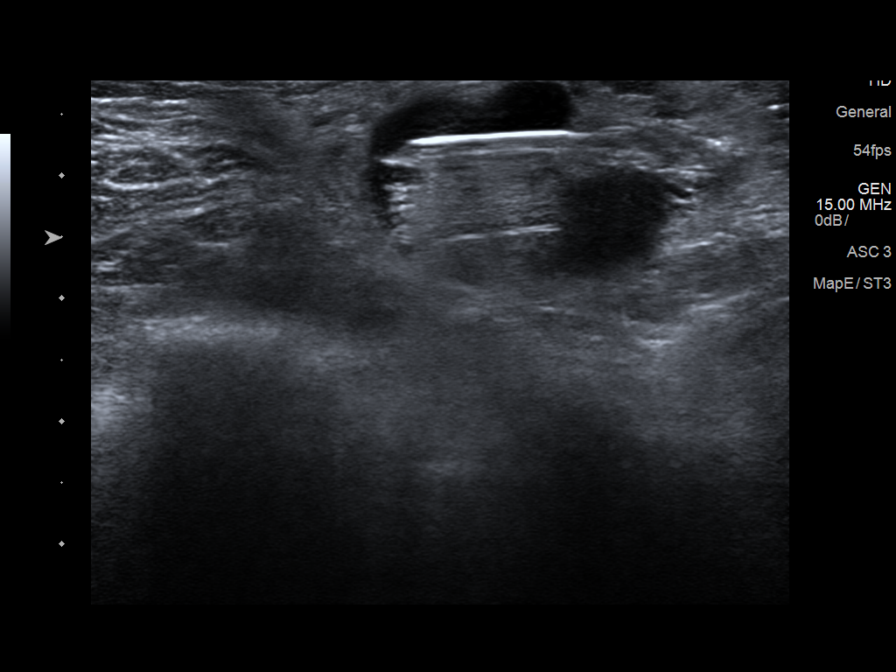
[im 13/15]
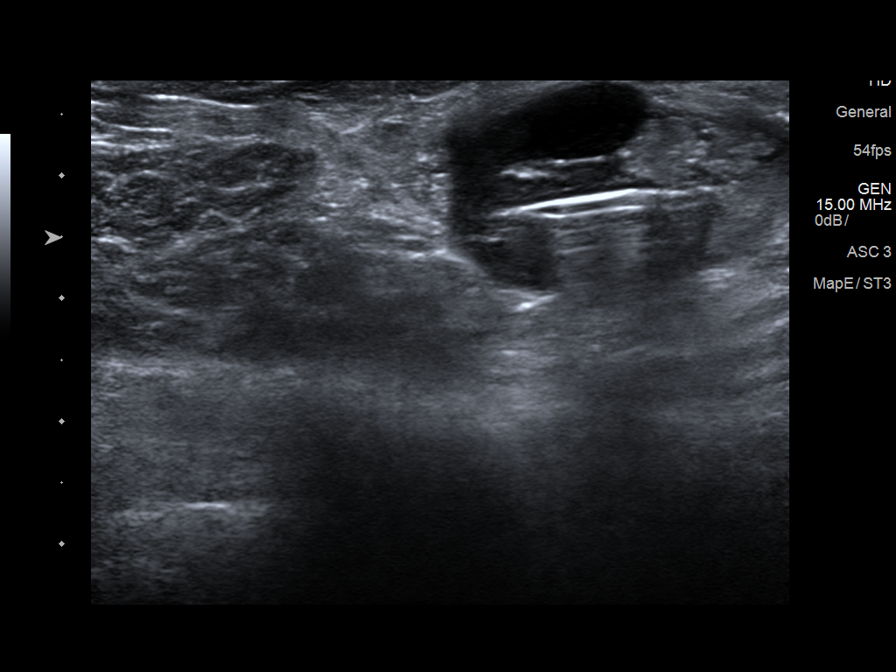
[im 15/15]
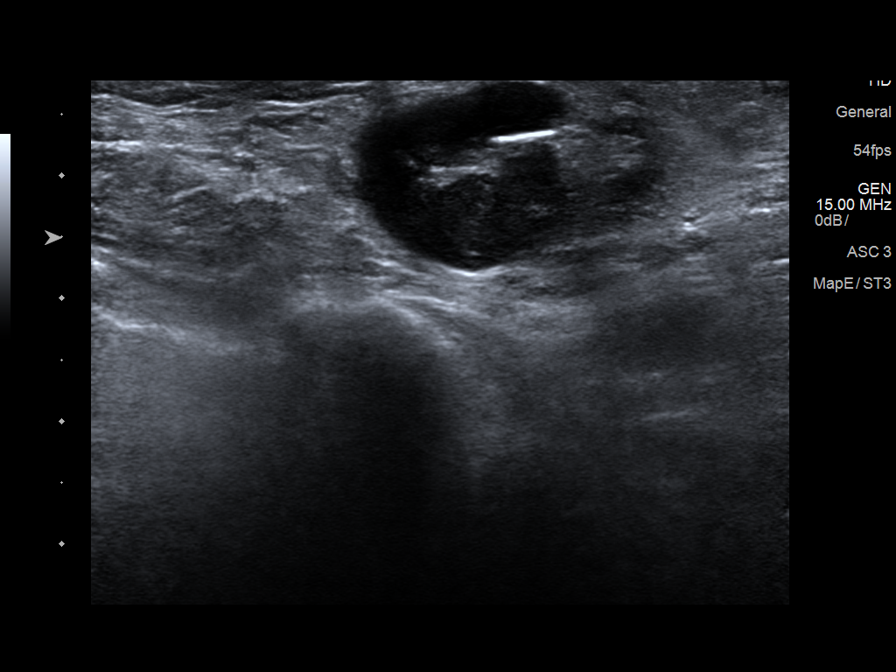

[8 of 8 positions shown; findings below may reference images not displayed]



Lesion quadrant: Upper outer quadrant

Using sterile technique and 1% Lidocaine as local anesthetic, under
direct ultrasound visualization, a 14 gauge Perroni device was
used to perform biopsy of palpable mass spanning 12 o'clock to 2
o'clock position left breast using a medial approach. At the
conclusion of the procedure ribbon tissue marker clip was deployed
into the biopsy cavity. Follow up 2 view mammogram was performed and
dictated separately.
IMPRESSION: Ultrasound guided biopsy of a palpable left breast mass. No apparent
complications.

ADDENDUM:
Pathology revealed AT LEAST GRADE II INVASIVE MAMMARY CARCINOMA,
MAMMARY CARCINOMA IN SITU of the LEFT breast, 12 o'clock-5 o'clock.
This was found to be concordant by Dr. Istefany Mts.

Pathology revealed METASTATIC CARCINOMA IN 1 OF 1 LYMPH NODE ([DATE]),
WITH EXTRA CAPSULAR EXTENSION of the LEFT inferior axilla. This was
found to be concordant by Dr. Istefany Mts

Pathology revealed GRADE I INVASIVE DUCTAL CARCINOMA of the RIGHT
breast, 9:30 o'clock, 4cmfn. This was found to be concordant by Dr.
Istefany Mts.

Pathology revealed GRADE I-II INVASIVE AND IN SITU MAMMARY
CARCINOMA, VASCULAR CALCIFICATIONS of the RIGHT breast, 12 o'clock,
3cm . This was found to be concordant by Dr. Istefany Mts.

Pathology results were discussed with the patient and son (Dimitris Chatzinikolas
Ceejay) by telephone. The patient reported doing well after the
biopsies with tenderness at the sites. Post biopsy instructions and
care were reviewed and questions were answered. The patient was
encouraged to call The [REDACTED] for any
additional concerns.

Surgical consultation has been arranged with Dr. Karger Chato at
[REDACTED] on May 27, 2019.

Pathology results reported by Tianyi Sheldon RN on 05/20/2019.



Lesion quadrant: Upper outer quadrant

Using sterile technique and 1% Lidocaine as local anesthetic, under
direct ultrasound visualization, a 14 gauge Perroni device was
used to perform biopsy of palpable mass spanning 12 o'clock to 2
o'clock position left breast using a medial approach. At the
conclusion of the procedure ribbon tissue marker clip was deployed
into the biopsy cavity. Follow up 2 view mammogram was performed and
dictated separately.
IMPRESSION: Ultrasound guided biopsy of a palpable left breast mass. No apparent
complications.

## 2021-10-11 IMAGING — MG DIGITAL DIAGNOSTIC BILAT W/ TOMO W/ CAD
5 of 8 series · 5 of 24 positions shown · non-contrast
Comparison: None.
COMPARISON: None.

Addendum:
CLINICAL DATA: Patient describes a palpable lump within the LEFT
breast.

EXAM:
DIGITAL DIAGNOSTIC BILATERAL MAMMOGRAM WITH CAD AND TOMO
ULTRASOUND BILATERAL BREAST

[L MLO synth-2D]
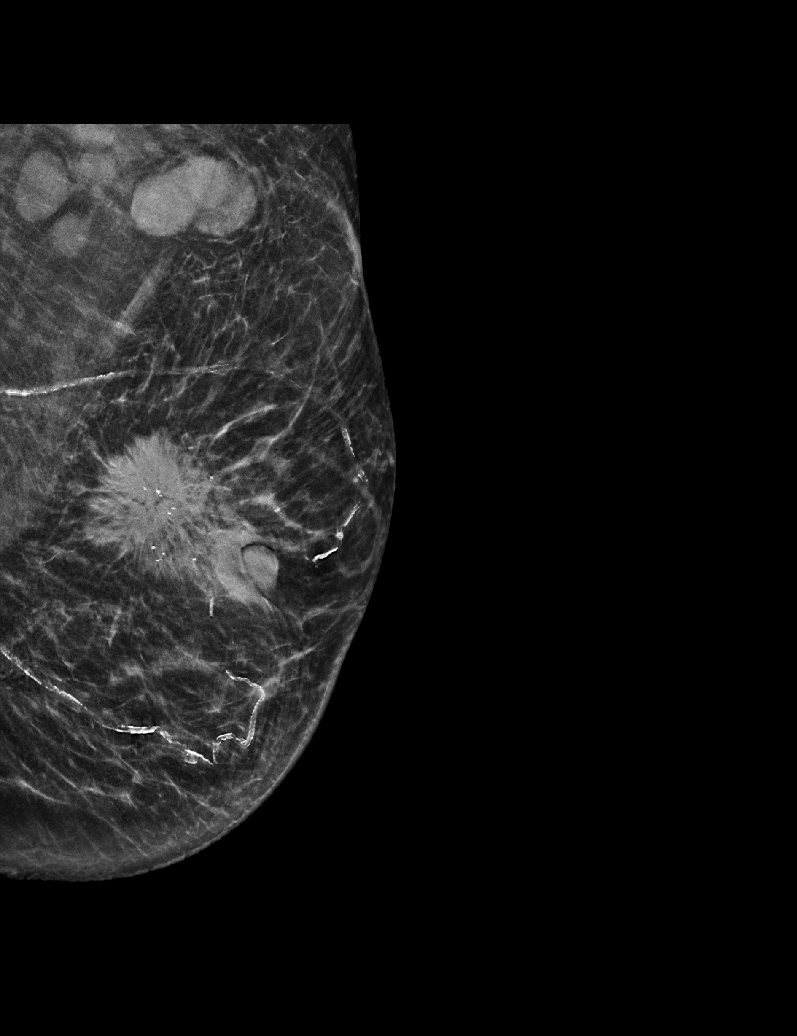

[L CC synth-2D]
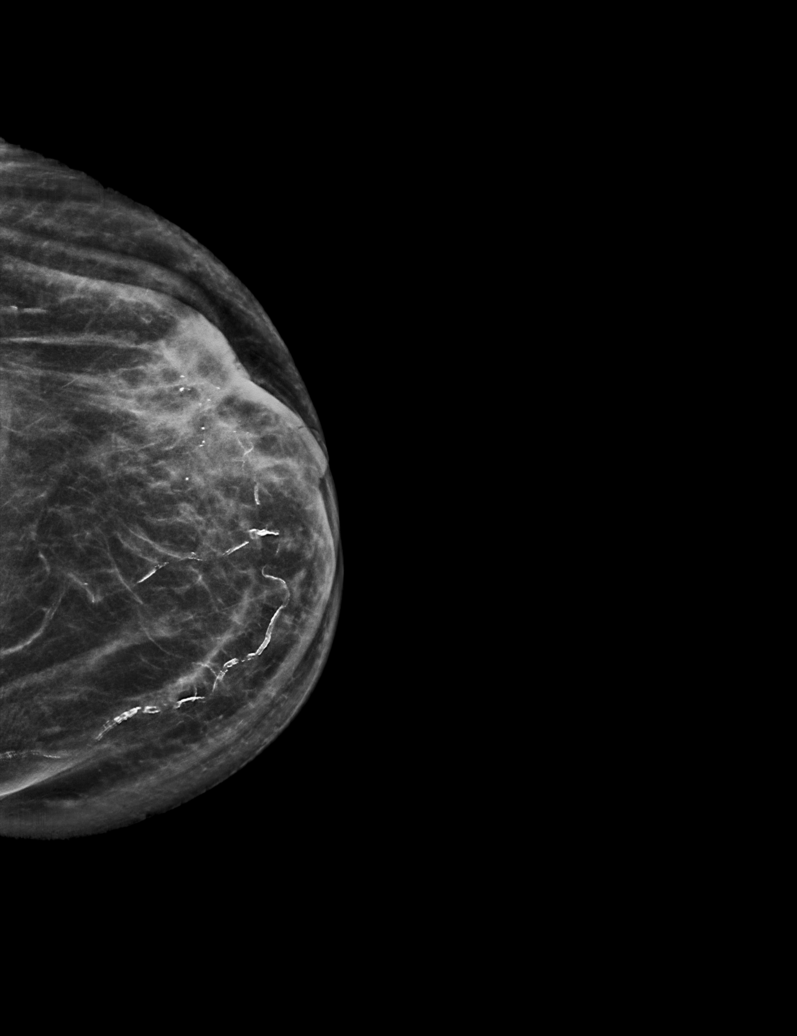

[R MLO synth-2D]
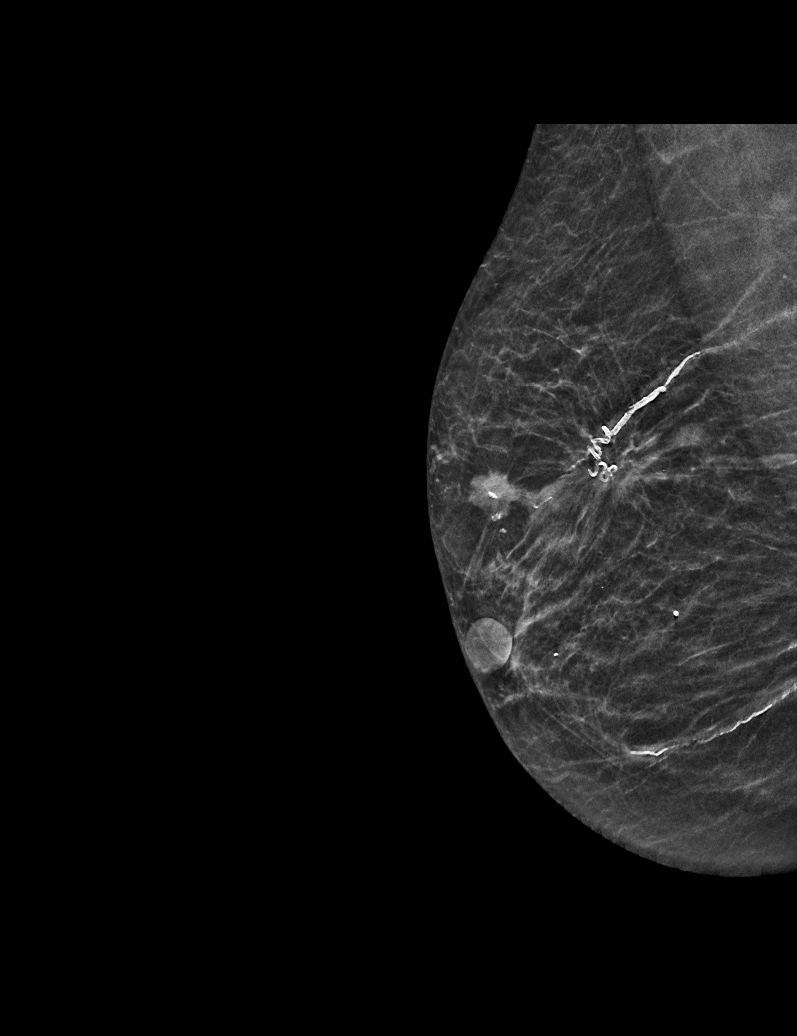

[R CC synth-2D]
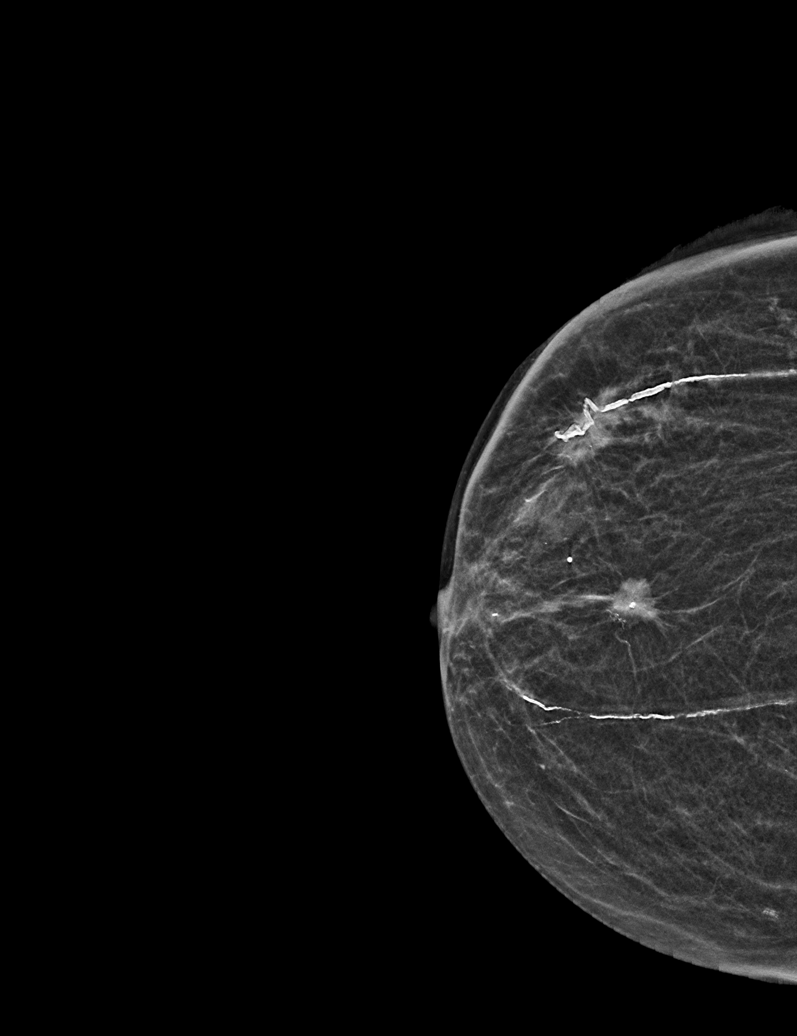

[L CC tomo · tomo slice 27/52.0]
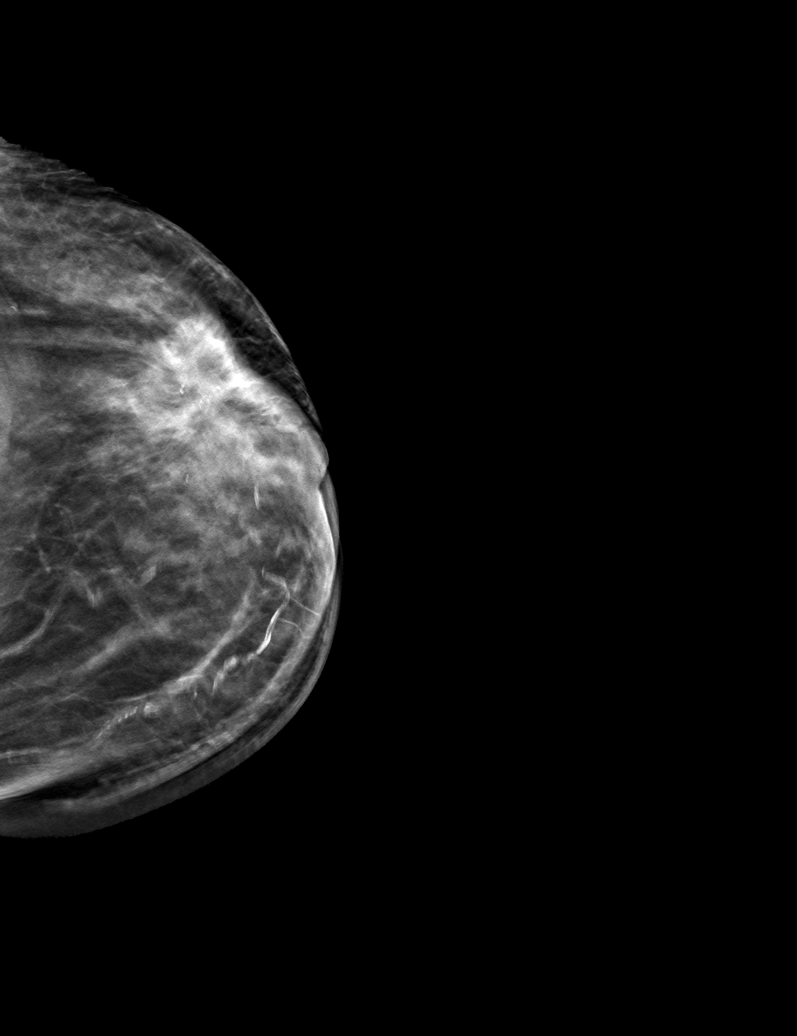

[5 of 24 positions shown; findings below may reference images not displayed]

ACR Breast Density Category b: There are scattered areas of
fibroglandular density.
FINDINGS: LEFT breast: There is a spiculated mass within the slightly upper
outer quadrant of the LEFT breast, partially retroareolar with
retraction of the skin/nipple, measuring approximately 3.3 cm
greatest dimension, with surrounding trabecular thickening raising
the possibility of lymphatic involvement/inflammatory breast cancer.

RIGHT breast: Spiculated mass within the upper RIGHT breast, at
middle depth, 12 o'clock axis region, measuring approximately 1 cm
greatest dimension.

Additional spiculated mass within the outer RIGHT breast, at middle
depth, 9-10 o'clock axis, measuring approximately 1.7 cm greatest
dimension. There is overlying skin retraction and surrounding
trabecular thickening suggesting lymphatic involvement/inflammatory
breast cancer.

LEFT axilla: There are numerous enlarged lymph nodes in the LEFT
axilla.

Mammographic images were processed with CAD.

LEFT breast: Targeted ultrasound is performed, showing an irregular
shadowing mass extending from the 12-2 o'clock axis, 3 cm from the
nipple, measuring approximately 4.9 cm greatest dimension, extending
to and likely involving the overlying skin/nipple.

RIGHT breast: Targeted ultrasound is performed, showing an irregular
hypoechoic mass in the RIGHT breast at the 12 o'clock axis, 3 cm
from the nipple, measuring 1.2 x 1 x 1 cm, with associated
architectural distortion, corresponding to the mammographic finding.

Additional irregular hypoechoic mass in the RIGHT breast at the 9:30
o'clock axis, 3 cm from the nipple, measuring 2.5 x 1.5 x 2.1 cm,
corresponding to the additional mammographic finding.

The 2 RIGHT breast masses are located approximately 2.6 cm apart.

LEFT axilla: There are at least 8 enlarged/morphologically abnormal
lymph nodes in the LEFT axilla.
IMPRESSION: 1. Highly suspicious spiculated mass within the LEFT breast,
extending from 12-2 o'clock axes, 3 cm from the nipple, measuring
approximately 4.9 cm greatest dimension, extending to and likely
involving the overlying skin/nipple with associated skin/nipple
retraction. Surrounding edematous change suggests lymphangitic
involvement/inflammatory breast cancer.
2. Highly suspicious mass within the RIGHT breast at the 12 o'clock
axis, 3 cm from the nipple, measuring 1.2 cm.
3. Highly suspicious mass in the RIGHT breast at the 9:30 o'clock
axis, 3 cm from the nipple, measuring 2.5 cm.
4. Highly suspicious enlarged/morphologically abnormal lymph nodes
in the LEFT axilla (at least 8 abnormal lymph nodes), likely
metastatic lymphadenopathy.

RECOMMENDATION:
1. Ultrasound-guided biopsy of the LEFT breast mass, 12-2 o'clock
axes, measuring approximately 4.9 cm.
2. Ultrasound-guided biopsy of the RIGHT breast mass at the 12
o'clock axis.
3. Ultrasound-guided biopsy of the RIGHT breast mass at the 9:30
o'clock axis.
4. Ultrasound-guided biopsy of 1 of the enlarged/morphologically
abnormal lymph nodes in the LEFT axilla.

Ultrasound-guided biopsy will be performed later today.

I have discussed the findings and recommendations with the patient.
If applicable, a reminder letter will be sent to the patient
regarding the next appointment.

BI-RADS CATEGORY  5: Highly suggestive of malignancy.

ADDENDUM:
RIGHT axilla was evaluated with ultrasound showing no enlarged or
morphologically abnormal lymph nodes.

*** End of Addendum ***
ACR Breast Density Category b: There are scattered areas of
fibroglandular density.
FINDINGS: LEFT breast: There is a spiculated mass within the slightly upper
outer quadrant of the LEFT breast, partially retroareolar with
retraction of the skin/nipple, measuring approximately 3.3 cm
greatest dimension, with surrounding trabecular thickening raising
the possibility of lymphatic involvement/inflammatory breast cancer.

RIGHT breast: Spiculated mass within the upper RIGHT breast, at
middle depth, 12 o'clock axis region, measuring approximately 1 cm
greatest dimension.

Additional spiculated mass within the outer RIGHT breast, at middle
depth, 9-10 o'clock axis, measuring approximately 1.7 cm greatest
dimension. There is overlying skin retraction and surrounding
trabecular thickening suggesting lymphatic involvement/inflammatory
breast cancer.

LEFT axilla: There are numerous enlarged lymph nodes in the LEFT
axilla.

Mammographic images were processed with CAD.

LEFT breast: Targeted ultrasound is performed, showing an irregular
shadowing mass extending from the 12-2 o'clock axis, 3 cm from the
nipple, measuring approximately 4.9 cm greatest dimension, extending
to and likely involving the overlying skin/nipple.

RIGHT breast: Targeted ultrasound is performed, showing an irregular
hypoechoic mass in the RIGHT breast at the 12 o'clock axis, 3 cm
from the nipple, measuring 1.2 x 1 x 1 cm, with associated
architectural distortion, corresponding to the mammographic finding.

Additional irregular hypoechoic mass in the RIGHT breast at the 9:30
o'clock axis, 3 cm from the nipple, measuring 2.5 x 1.5 x 2.1 cm,
corresponding to the additional mammographic finding.

The 2 RIGHT breast masses are located approximately 2.6 cm apart.

LEFT axilla: There are at least 8 enlarged/morphologically abnormal
lymph nodes in the LEFT axilla.
IMPRESSION: 1. Highly suspicious spiculated mass within the LEFT breast,
extending from 12-2 o'clock axes, 3 cm from the nipple, measuring
approximately 4.9 cm greatest dimension, extending to and likely
involving the overlying skin/nipple with associated skin/nipple
retraction. Surrounding edematous change suggests lymphangitic
involvement/inflammatory breast cancer.
2. Highly suspicious mass within the RIGHT breast at the 12 o'clock
axis, 3 cm from the nipple, measuring 1.2 cm.
3. Highly suspicious mass in the RIGHT breast at the 9:30 o'clock
axis, 3 cm from the nipple, measuring 2.5 cm.
4. Highly suspicious enlarged/morphologically abnormal lymph nodes
in the LEFT axilla (at least 8 abnormal lymph nodes), likely
metastatic lymphadenopathy.

RECOMMENDATION:
1. Ultrasound-guided biopsy of the LEFT breast mass, 12-2 o'clock
axes, measuring approximately 4.9 cm.
2. Ultrasound-guided biopsy of the RIGHT breast mass at the 12
o'clock axis.
3. Ultrasound-guided biopsy of the RIGHT breast mass at the 9:30
o'clock axis.
4. Ultrasound-guided biopsy of 1 of the enlarged/morphologically
abnormal lymph nodes in the LEFT axilla.

Ultrasound-guided biopsy will be performed later today.

I have discussed the findings and recommendations with the patient.
If applicable, a reminder letter will be sent to the patient
regarding the next appointment.

BI-RADS CATEGORY  5: Highly suggestive of malignancy.

## 2021-10-11 IMAGING — US US BREAST*L* LIMITED INC AXILLA
1 series · 14 of 25 positions shown · non-contrast
Comparison: None.
COMPARISON: None.

Addendum:
CLINICAL DATA: Patient describes a palpable lump within the LEFT
breast.

EXAM:
DIGITAL DIAGNOSTIC BILATERAL MAMMOGRAM WITH CAD AND TOMO
ULTRASOUND BILATERAL BREAST

[Series 1: us breast*left* limited inc axilla · 0.07mm/px · 14 of 26 slices shown]
[im 1/26]
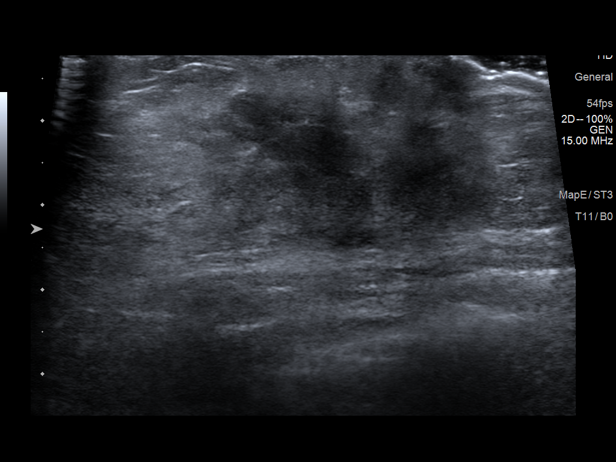
[im 3/26]
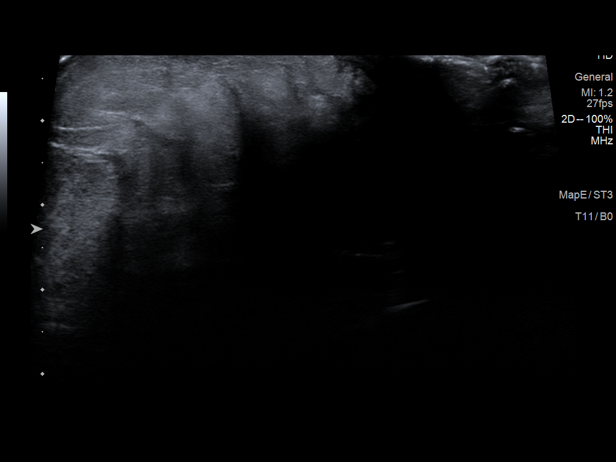
[im 5/26]
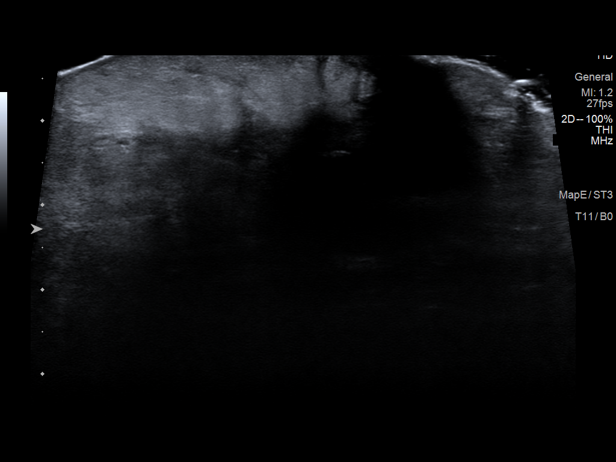
[im 7/26]
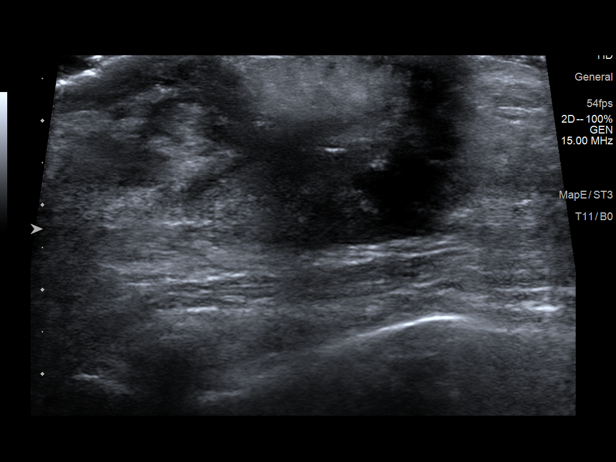
[im 9/26]
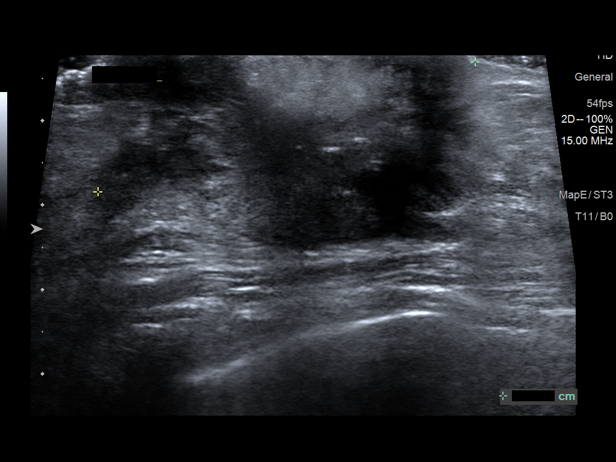
[im 10/26]
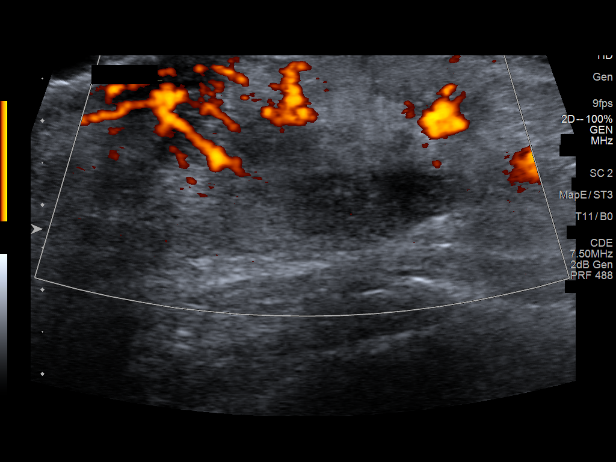
[im 12/26]
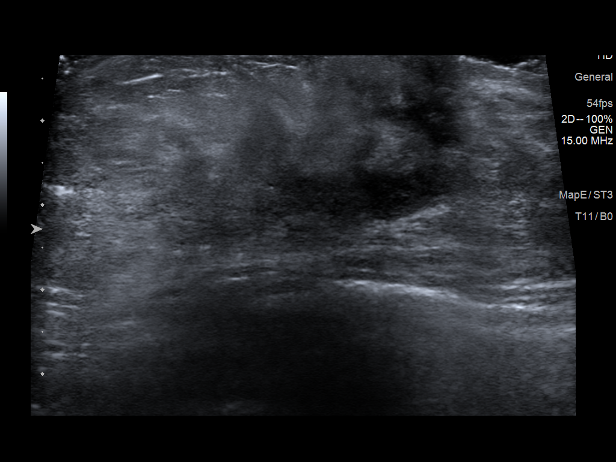
[im 14/26]
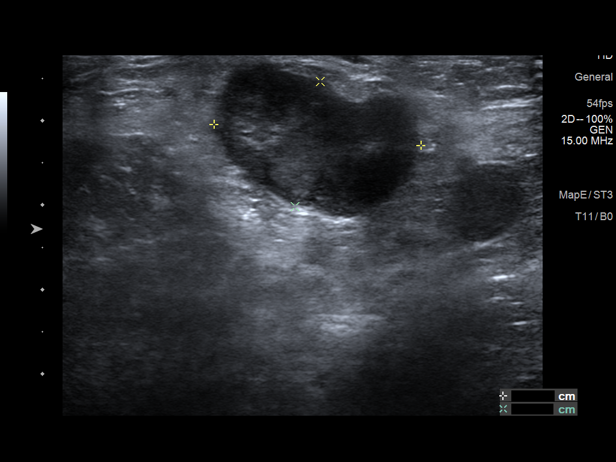
[im 16/26]
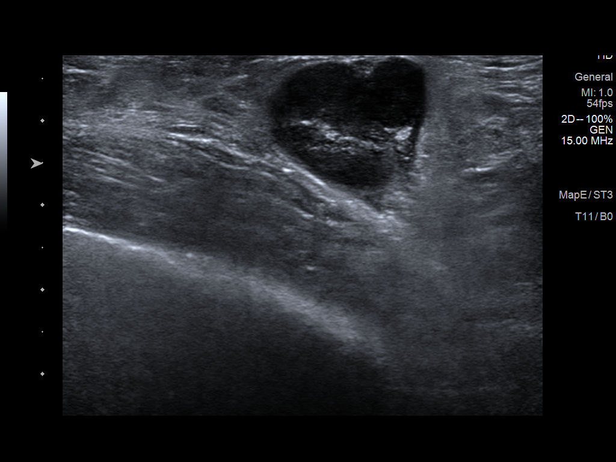
[im 17/26]
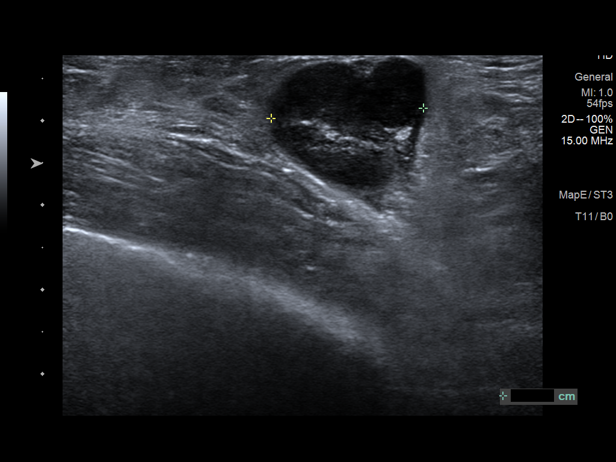
[im 19/26]
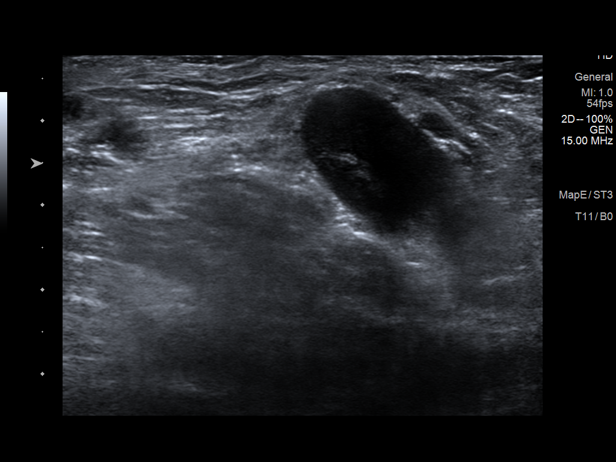
[im 21/26]
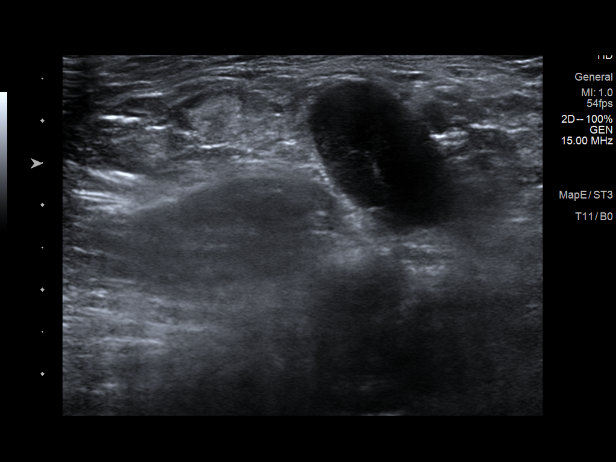
[im 23/26]
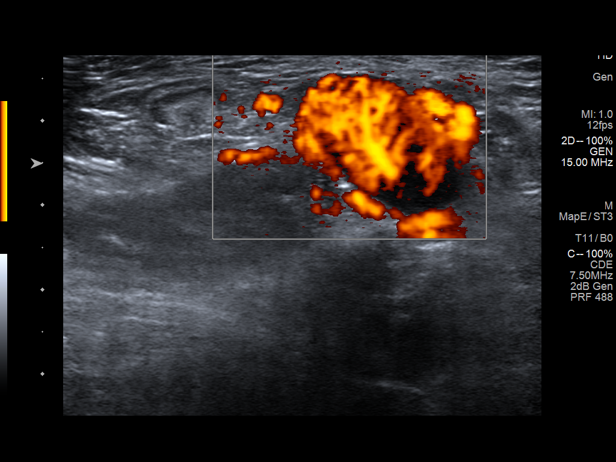
[im 26/26]
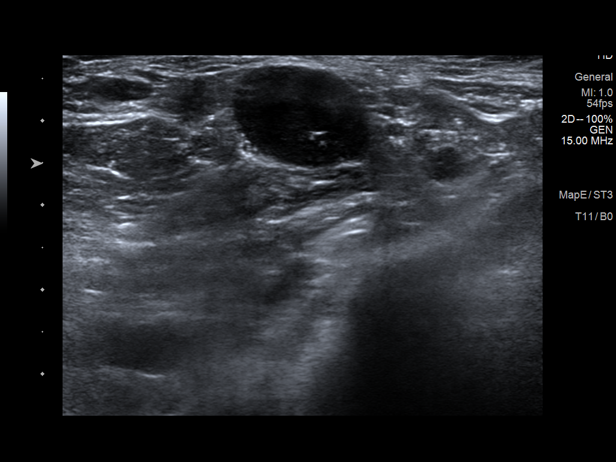

[14 of 25 positions shown; findings below may reference images not displayed]

ACR Breast Density Category b: There are scattered areas of
fibroglandular density.
FINDINGS: LEFT breast: There is a spiculated mass within the slightly upper
outer quadrant of the LEFT breast, partially retroareolar with
retraction of the skin/nipple, measuring approximately 3.3 cm
greatest dimension, with surrounding trabecular thickening raising
the possibility of lymphatic involvement/inflammatory breast cancer.

RIGHT breast: Spiculated mass within the upper RIGHT breast, at
middle depth, 12 o'clock axis region, measuring approximately 1 cm
greatest dimension.

Additional spiculated mass within the outer RIGHT breast, at middle
depth, 9-10 o'clock axis, measuring approximately 1.7 cm greatest
dimension. There is overlying skin retraction and surrounding
trabecular thickening suggesting lymphatic involvement/inflammatory
breast cancer.

LEFT axilla: There are numerous enlarged lymph nodes in the LEFT
axilla.

Mammographic images were processed with CAD.

LEFT breast: Targeted ultrasound is performed, showing an irregular
shadowing mass extending from the 12-2 o'clock axis, 3 cm from the
nipple, measuring approximately 4.9 cm greatest dimension, extending
to and likely involving the overlying skin/nipple.

RIGHT breast: Targeted ultrasound is performed, showing an irregular
hypoechoic mass in the RIGHT breast at the 12 o'clock axis, 3 cm
from the nipple, measuring 1.2 x 1 x 1 cm, with associated
architectural distortion, corresponding to the mammographic finding.

Additional irregular hypoechoic mass in the RIGHT breast at the 9:30
o'clock axis, 3 cm from the nipple, measuring 2.5 x 1.5 x 2.1 cm,
corresponding to the additional mammographic finding.

The 2 RIGHT breast masses are located approximately 2.6 cm apart.

LEFT axilla: There are at least 8 enlarged/morphologically abnormal
lymph nodes in the LEFT axilla.
IMPRESSION: 1. Highly suspicious spiculated mass within the LEFT breast,
extending from 12-2 o'clock axes, 3 cm from the nipple, measuring
approximately 4.9 cm greatest dimension, extending to and likely
involving the overlying skin/nipple with associated skin/nipple
retraction. Surrounding edematous change suggests lymphangitic
involvement/inflammatory breast cancer.
2. Highly suspicious mass within the RIGHT breast at the 12 o'clock
axis, 3 cm from the nipple, measuring 1.2 cm.
3. Highly suspicious mass in the RIGHT breast at the 9:30 o'clock
axis, 3 cm from the nipple, measuring 2.5 cm.
4. Highly suspicious enlarged/morphologically abnormal lymph nodes
in the LEFT axilla (at least 8 abnormal lymph nodes), likely
metastatic lymphadenopathy.

RECOMMENDATION:
1. Ultrasound-guided biopsy of the LEFT breast mass, 12-2 o'clock
axes, measuring approximately 4.9 cm.
2. Ultrasound-guided biopsy of the RIGHT breast mass at the 12
o'clock axis.
3. Ultrasound-guided biopsy of the RIGHT breast mass at the 9:30
o'clock axis.
4. Ultrasound-guided biopsy of 1 of the enlarged/morphologically
abnormal lymph nodes in the LEFT axilla.

Ultrasound-guided biopsy will be performed later today.

I have discussed the findings and recommendations with the patient.
If applicable, a reminder letter will be sent to the patient
regarding the next appointment.

BI-RADS CATEGORY  5: Highly suggestive of malignancy.

ADDENDUM:
RIGHT axilla was evaluated with ultrasound showing no enlarged or
morphologically abnormal lymph nodes.

*** End of Addendum ***
ACR Breast Density Category b: There are scattered areas of
fibroglandular density.
FINDINGS: LEFT breast: There is a spiculated mass within the slightly upper
outer quadrant of the LEFT breast, partially retroareolar with
retraction of the skin/nipple, measuring approximately 3.3 cm
greatest dimension, with surrounding trabecular thickening raising
the possibility of lymphatic involvement/inflammatory breast cancer.

RIGHT breast: Spiculated mass within the upper RIGHT breast, at
middle depth, 12 o'clock axis region, measuring approximately 1 cm
greatest dimension.

Additional spiculated mass within the outer RIGHT breast, at middle
depth, 9-10 o'clock axis, measuring approximately 1.7 cm greatest
dimension. There is overlying skin retraction and surrounding
trabecular thickening suggesting lymphatic involvement/inflammatory
breast cancer.

LEFT axilla: There are numerous enlarged lymph nodes in the LEFT
axilla.

Mammographic images were processed with CAD.

LEFT breast: Targeted ultrasound is performed, showing an irregular
shadowing mass extending from the 12-2 o'clock axis, 3 cm from the
nipple, measuring approximately 4.9 cm greatest dimension, extending
to and likely involving the overlying skin/nipple.

RIGHT breast: Targeted ultrasound is performed, showing an irregular
hypoechoic mass in the RIGHT breast at the 12 o'clock axis, 3 cm
from the nipple, measuring 1.2 x 1 x 1 cm, with associated
architectural distortion, corresponding to the mammographic finding.

Additional irregular hypoechoic mass in the RIGHT breast at the 9:30
o'clock axis, 3 cm from the nipple, measuring 2.5 x 1.5 x 2.1 cm,
corresponding to the additional mammographic finding.

The 2 RIGHT breast masses are located approximately 2.6 cm apart.

LEFT axilla: There are at least 8 enlarged/morphologically abnormal
lymph nodes in the LEFT axilla.
IMPRESSION: 1. Highly suspicious spiculated mass within the LEFT breast,
extending from 12-2 o'clock axes, 3 cm from the nipple, measuring
approximately 4.9 cm greatest dimension, extending to and likely
involving the overlying skin/nipple with associated skin/nipple
retraction. Surrounding edematous change suggests lymphangitic
involvement/inflammatory breast cancer.
2. Highly suspicious mass within the RIGHT breast at the 12 o'clock
axis, 3 cm from the nipple, measuring 1.2 cm.
3. Highly suspicious mass in the RIGHT breast at the 9:30 o'clock
axis, 3 cm from the nipple, measuring 2.5 cm.
4. Highly suspicious enlarged/morphologically abnormal lymph nodes
in the LEFT axilla (at least 8 abnormal lymph nodes), likely
metastatic lymphadenopathy.

RECOMMENDATION:
1. Ultrasound-guided biopsy of the LEFT breast mass, 12-2 o'clock
axes, measuring approximately 4.9 cm.
2. Ultrasound-guided biopsy of the RIGHT breast mass at the 12
o'clock axis.
3. Ultrasound-guided biopsy of the RIGHT breast mass at the 9:30
o'clock axis.
4. Ultrasound-guided biopsy of 1 of the enlarged/morphologically
abnormal lymph nodes in the LEFT axilla.

Ultrasound-guided biopsy will be performed later today.

I have discussed the findings and recommendations with the patient.
If applicable, a reminder letter will be sent to the patient
regarding the next appointment.

BI-RADS CATEGORY  5: Highly suggestive of malignancy.

## 2023-02-02 IMAGING — DX DG CHEST 1V PORT
1 series · 1 of 1 positions shown · non-contrast
Comparison: Radiograph 07/05/2012, chest CT 06/07/2019

CLINICAL DATA: Questionable sepsis - evaluate for abnormality

EXAM:
PORTABLE CHEST 1 VIEW

[chest ap]
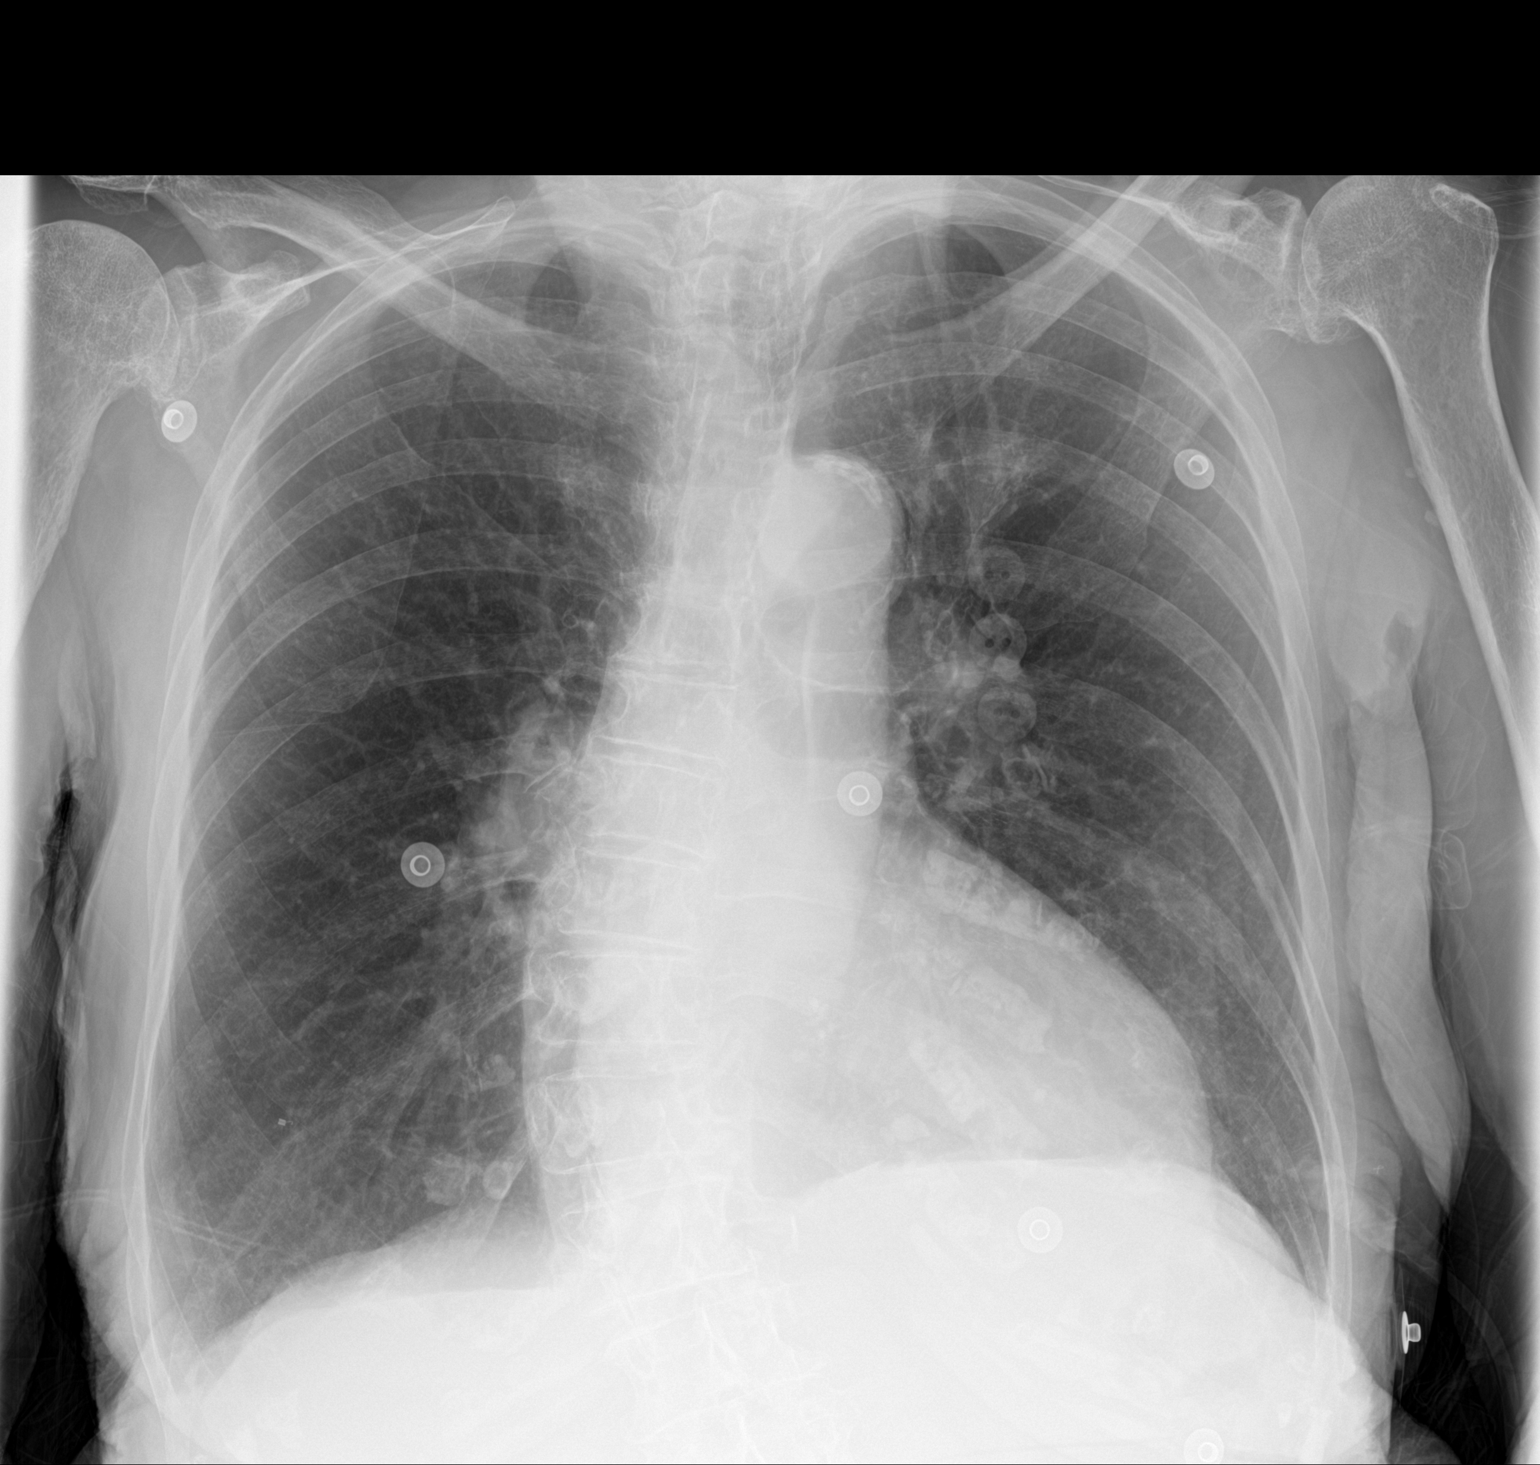

[1 of 1 positions shown; findings below may reference images not displayed]

FINDINGS: Unchanged cardiomediastinal silhouette. Aortic arch calcifications.
There is no focal airspace disease. There is no large pleural
effusion or visible pneumothorax. There is no acute osseous
abnormality. Dextroconvex curvature of the spine.
IMPRESSION: No focal airspace consolidation.
